# Patient Record
Sex: Female | Born: 1935 | Race: White | Hispanic: No | Smoking: Never smoker
Health system: Southern US, Community
[De-identification: ages and names within clinical notes are randomized; demographics above are authoritative.]

## PROBLEM LIST (undated history)

## (undated) DIAGNOSIS — I10 Essential (primary) hypertension: Secondary | ICD-10-CM

## (undated) DIAGNOSIS — I2510 Atherosclerotic heart disease of native coronary artery without angina pectoris: Secondary | ICD-10-CM

## (undated) DIAGNOSIS — F419 Anxiety disorder, unspecified: Secondary | ICD-10-CM

## (undated) DIAGNOSIS — H269 Unspecified cataract: Secondary | ICD-10-CM

## (undated) DIAGNOSIS — I739 Peripheral vascular disease, unspecified: Secondary | ICD-10-CM

## (undated) DIAGNOSIS — M1990 Unspecified osteoarthritis, unspecified site: Secondary | ICD-10-CM

## (undated) DIAGNOSIS — I509 Heart failure, unspecified: Secondary | ICD-10-CM

## (undated) DIAGNOSIS — R600 Localized edema: Secondary | ICD-10-CM

## (undated) DIAGNOSIS — L89159 Pressure ulcer of sacral region, unspecified stage: Secondary | ICD-10-CM

## (undated) DIAGNOSIS — E785 Hyperlipidemia, unspecified: Secondary | ICD-10-CM

## (undated) DIAGNOSIS — H3320 Serous retinal detachment, unspecified eye: Secondary | ICD-10-CM

## (undated) DIAGNOSIS — I639 Cerebral infarction, unspecified: Secondary | ICD-10-CM

## (undated) HISTORY — PX: TOTAL KNEE ARTHROPLASTY: SHX125

## (undated) HISTORY — PX: TONSILLECTOMY: SUR1361

## (undated) HISTORY — PX: OTHER SURGICAL HISTORY: SHX169

## (undated) HISTORY — PX: LUMBAR DISC SURGERY: SHX700

## (undated) HISTORY — PX: ANGIOPLASTY: SHX39

## (undated) HISTORY — DX: Hyperlipidemia, unspecified: E78.5

## (undated) HISTORY — PX: RETINAL DETACHMENT SURGERY: SHX105

## (undated) HISTORY — DX: Cerebral infarction, unspecified: I63.9

## (undated) HISTORY — DX: Anxiety disorder, unspecified: F41.9

## (undated) HISTORY — PX: CHOLECYSTECTOMY: SHX55

## (undated) HISTORY — DX: Peripheral vascular disease, unspecified: I73.9

## (undated) HISTORY — PX: JOINT REPLACEMENT: SHX530

---

## 1998-12-27 ENCOUNTER — Emergency Department (HOSPITAL_COMMUNITY): Admission: EM | Admit: 1998-12-27 | Discharge: 1998-12-27 | Payer: Self-pay | Admitting: Emergency Medicine

## 1999-06-19 ENCOUNTER — Encounter: Admission: RE | Admit: 1999-06-19 | Discharge: 1999-07-02 | Payer: Self-pay | Admitting: Orthopedic Surgery

## 2000-03-16 ENCOUNTER — Encounter: Payer: Self-pay | Admitting: Family Medicine

## 2000-03-16 ENCOUNTER — Encounter: Admission: RE | Admit: 2000-03-16 | Discharge: 2000-03-16 | Payer: Self-pay | Admitting: Family Medicine

## 2001-05-06 ENCOUNTER — Observation Stay (HOSPITAL_COMMUNITY): Admission: EM | Admit: 2001-05-06 | Discharge: 2001-05-07 | Payer: Self-pay | Admitting: *Deleted

## 2001-05-06 ENCOUNTER — Encounter: Payer: Self-pay | Admitting: Internal Medicine

## 2002-08-09 ENCOUNTER — Other Ambulatory Visit: Admission: RE | Admit: 2002-08-09 | Discharge: 2002-08-09 | Payer: Self-pay | Admitting: Family Medicine

## 2003-02-23 ENCOUNTER — Encounter: Payer: Self-pay | Admitting: Family Medicine

## 2003-02-23 ENCOUNTER — Encounter: Admission: RE | Admit: 2003-02-23 | Discharge: 2003-02-23 | Payer: Self-pay | Admitting: Family Medicine

## 2004-12-11 ENCOUNTER — Encounter: Admission: RE | Admit: 2004-12-11 | Discharge: 2004-12-11 | Payer: Self-pay | Admitting: Family Medicine

## 2005-02-18 ENCOUNTER — Inpatient Hospital Stay (HOSPITAL_COMMUNITY): Admission: EM | Admit: 2005-02-18 | Discharge: 2005-02-20 | Payer: Self-pay | Admitting: Emergency Medicine

## 2005-02-19 ENCOUNTER — Encounter (INDEPENDENT_AMBULATORY_CARE_PROVIDER_SITE_OTHER): Payer: Self-pay | Admitting: *Deleted

## 2005-08-20 ENCOUNTER — Encounter: Admission: RE | Admit: 2005-08-20 | Discharge: 2005-08-20 | Payer: Self-pay | Admitting: Family Medicine

## 2005-09-03 ENCOUNTER — Encounter: Admission: RE | Admit: 2005-09-03 | Discharge: 2005-09-03 | Payer: Self-pay | Admitting: Specialist

## 2005-09-18 ENCOUNTER — Encounter: Admission: RE | Admit: 2005-09-18 | Discharge: 2005-09-18 | Payer: Self-pay | Admitting: Specialist

## 2005-12-15 ENCOUNTER — Encounter: Admission: RE | Admit: 2005-12-15 | Discharge: 2005-12-15 | Payer: Self-pay | Admitting: Family Medicine

## 2006-06-09 ENCOUNTER — Emergency Department (HOSPITAL_COMMUNITY): Admission: EM | Admit: 2006-06-09 | Discharge: 2006-06-09 | Payer: Self-pay | Admitting: Emergency Medicine

## 2006-12-11 ENCOUNTER — Ambulatory Visit: Payer: Self-pay | Admitting: Cardiology

## 2006-12-17 ENCOUNTER — Ambulatory Visit: Payer: Self-pay

## 2007-02-17 ENCOUNTER — Inpatient Hospital Stay (HOSPITAL_COMMUNITY): Admission: RE | Admit: 2007-02-17 | Discharge: 2007-02-22 | Payer: Self-pay | Admitting: Specialist

## 2007-03-23 ENCOUNTER — Encounter: Admission: RE | Admit: 2007-03-23 | Discharge: 2007-06-21 | Payer: Self-pay | Admitting: Specialist

## 2007-11-25 ENCOUNTER — Encounter: Admission: RE | Admit: 2007-11-25 | Discharge: 2007-11-25 | Payer: Self-pay | Admitting: Specialist

## 2008-03-09 ENCOUNTER — Inpatient Hospital Stay (HOSPITAL_COMMUNITY): Admission: RE | Admit: 2008-03-09 | Discharge: 2008-03-16 | Payer: Self-pay | Admitting: Specialist

## 2008-03-14 ENCOUNTER — Encounter (INDEPENDENT_AMBULATORY_CARE_PROVIDER_SITE_OTHER): Payer: Self-pay | Admitting: Specialist

## 2008-03-14 ENCOUNTER — Ambulatory Visit: Payer: Self-pay | Admitting: Physical Medicine & Rehabilitation

## 2008-03-15 ENCOUNTER — Encounter (INDEPENDENT_AMBULATORY_CARE_PROVIDER_SITE_OTHER): Payer: Self-pay | Admitting: Specialist

## 2008-03-16 ENCOUNTER — Inpatient Hospital Stay (HOSPITAL_COMMUNITY)
Admission: RE | Admit: 2008-03-16 | Discharge: 2008-03-21 | Payer: Self-pay | Admitting: Physical Medicine & Rehabilitation

## 2008-03-16 ENCOUNTER — Ambulatory Visit: Payer: Self-pay | Admitting: Physical Medicine & Rehabilitation

## 2010-10-06 ENCOUNTER — Encounter: Payer: Self-pay | Admitting: Specialist

## 2011-01-28 NOTE — Discharge Summary (Signed)
NAMEDORINDA, STEHR NO.:  [id number]   MEDICAL RECORD NO.:  [id number]           PATIENT TYPE:   LOCATION:                                 FACILITY:   PHYSICIAN:  Ellwood Dense, M.D.   DATE OF BIRTH:  05-26-1931   DATE OF ADMISSION:  03/16/2008  DATE OF DISCHARGE:  03/21/2008                               DISCHARGE SUMMARY   DISCHARGE DIAGNOSES:  1. Lumbar stenosis with myelopathy.  2. Postoperative acute renal failure resolved.  3. Hyponatremia resolved.  4. Pain management.  5. Hypertension.  6. Hyperlipidemia.  7. Panic attacks.  8. Subcutaneous Lovenox for deep vein thrombosis prophylaxis.   Status post lumbar L3-L4, L4-L5 central lumbar decompression March 09, 2008, for lumbar stenosis per Dr. Jillyn Hidden.   This is a 77 year old white female history of bilateral total knee  replacements admitted March 09, 2008, with low back pain radiating to the  lower extremities.  MRI and myelogram showed degenerative disk disease,  spinal stenosis of lumbar L3-L4, L4-L5 with significant neural  compression.  Underwent central lumbar decompression, lumbar L3-L4, L4-  L5 June 25 per Dr. Jillyn Hidden.  Placed on subcutaneous Lovenox for deep vein  thrombosis prophylaxis.  Postoperative creatinine 1.01 elevated 2.85 as  well as hyponatremia 130.  Hydrochlorothiazide was discontinued and  placed on gentle intravenous fluids.  Renal ultrasound was negative for  hydronephrosis.  Acute renal failure felt to be likely  hydrochlorothiazide and ACE inhibitor which was discontinued.  Creatinine as of March 16, 2008, and improved to 2.35.  She did have a  small macular rash felt to be secondary to Rocephin which was  discontinued.   PAST MEDICAL HISTORY:  See discharge diagnoses.  Occasional alcohol.  No  tobacco.   ALLERGIES:  PENICILLIN.   SOCIAL HISTORY:  Lives with 65 year old husband and assistance as  needed.  Local family works level one level home with three steps to   entry.   MEDICATIONS PRIOR TO ADMISSION:  1. Celebrex daily.  2. Diltiazem 180 mg 2 tablets daily.  3. Simvastatin 80 mg daily.  4. Aspirin 81 mg daily.  5. Alprazolam 0.25 mg as needed.  6. Quinapril - hydrochlorothiazide 20 - 12.5 mg daily.  7. Hydrocodone as needed.   REHABILITATION HOSPITAL COURSE:  The patient was admitted to inpatient  rehab services with therapies initiated on a 3-hour daily basis  consisting of physical therapy, occupational therapy, and rehabilitation  nursing.  The following issues were addressed during the patient's  rehabilitation stay.  Pertaining to Ms. Rubano's lumbar stenosis with  myelopathy, she had undergone lumbar L3-L4 L4-L5 central disk  decompression March 09, 2008, per Dr. Jillyn Hidden.  Surgical site healing  nicely.  No signs of infection.  Routine back precautions were noted.  She was using Robaxin for muscle spasms as well as Vicodin for  breakthrough pain with good results.  Functionally, she was ambulating  short household distances with a walker of 30-50 feet.  She was  supervision minimal assist for transfers, minimal assist for lower body  dressing.  Full family teaching was completed with her  husband and plan  was to be discharged to home.  She remained on subcutaneous Lovenox for  deep vein thrombosis prophylaxis throughout her rehab course.  Postoperative acute renal failure improved with latest creatinine on  March 20, 2008, of 1.03.  It was noted that her hydrochlorothiazide and  ACE inhibitor had since been discontinued with monitoring of her blood  pressure, now she was on Cardizem 360, mg a day as well as Lopressor 25  mg twice daily.  She did have a small macular rash which did resolve.  She completed a course of prednisone.  This rash was felt to be  secondary to some Rocephin which had since been discontinued.  She would  remain on her Zocor for hyperlipidemia.  She was using alprazolam as  needed for history of panic attacks.    LABORATORY DATA:  Latest labs as of March 20, 2008, she had a sodium of  140, potassium 3.6, BUN 16, and creatinine 1.03.  Latest hemoglobin of  9.6, hematocrit 28.6, and platelet 273,000.   DISCHARGE MEDICATIONS:  At time of dictation included:  1. Vitamin C 500 mg daily.  2. Cardizem 180 mg 2 tablets daily.  3. Reglan 10 mg a.c. and nightly.  4. Protonix 40 mg daily.  5. Zocor 80 mg at bedtime.  6. Claritin 10 mg daily.  7. Lopressor 25 mg every 12 hours.  8. Robaxin 500 mg every 6 hours as needed spasms.  9. Vicodin 5 - 325 1 or 2 tablets every 4 hours as needed pain.   DIET:  Regular.   SPECIAL INSTRUCTIONS:  Routine back precautions.  No smoking, no  drinking, and no driving.  Follow up with Dr. Jillyn Hidden.  Orthopedic services  called for appointment Dr. Maurice Small medical management.      Mariam Dollar, P.A.    ______________________________  Ellwood Dense, M.D.    DA/MEDQ  D:  03/20/2008  T:  03/21/2008  Job:  [id number]   cc:   Gretta Arab. Valentina Lucks, M.D.  Billie D. Bean, FNP

## 2011-01-28 NOTE — Consult Note (Signed)
[name], [name]              ACCOUNT NO.:  [id number]   MEDICAL RECORD NO.:  [id number]          PATIENT TYPE:  INP   LOCATION:  1226                         [facility]ACILITY[facility]  [facility]   PHYSICIAN:  Hind I Elsaid, MD      DATE OF BIRTH:  08/04/1931   DATE OF CONSULTATION:  03/13/2008  DATE OF DISCHARGE:                                 CONSULTATION   REASON FOR CONSULTATION:  Worsening renal function.   HISTORY OF PRESENT ILLNESS:  This is a 77 year old Caucasian female with  a history of hypertension, coronary artery disease, admitted with severe  right and left lower extremity radicular pain, back pain.  Found to have  degenerative scoliosis and myelogram indicates significant neuro  compression, particularly in the right L3/4 and 4/5 in the concavity of  her scoliosis.  She has been refractory to conservative treatment and  was disabled by her symptoms.  Patient underwent lumbar decompression on  March 09, 2008.  Today patient is day #4 of her lumbar decompression.  Her condition complicated by acute renal failure where found to have  creatinine of 2.6.  Medical Team consulted for evaluation of her  worsening renal function.  Patient apparently on hydrochlorothiazide and  lisinopril for controlling her high blood pressure.  Looking over her  preoperative and postoperative period, there is no reported evidence of  hypertension episode.  Patient noted to have diminished urine output on  June 28 and 29.  Also patient noted to have decreased appetite and  decreased p.o. intake.  No recoded fever, no recorded nausea or vomiting  and no recorded shortness of breath or chest pain.   PAST MEDICAL HISTORY:  Significant for:  1. Coronary artery disease.  2. Hypertension.  3. Hyperlipidemia.  4. Status post cholecystectomy.  5. Status post total knee replacement.  6. Status post history of panic attack.  7. Bilateral tubal ligation.   MEDICATIONS AT THE HOSPITAL:  1. Ascorbic acid.  2.   Aspirin.  3.  D5 half normal saline.  4.      Cardizem.  5.  Hydrochlorothiazide.  6.  Lisinopril.  7.      Metoclopramide.  8.  Protonix.  9.  Zocor.  10.  Xanax.  11.      Hydrocodone/APAP.  12.  Phenergan.  13.  Ambien.   HOME MEDICATION:  1. Celebrex.  2. Simvastatin 80 mg.  3. Aspirin.  4. Quinapril.  5. Hydrocodone/Tylenol.   ALLERGIES:  PENICILLIN.   SOCIAL HISTORY:  Patient is retired.  She is married, has 6 children.  No history of smoking, no history of IV drug abuse.   REVIEW OF SYSTEMS:  Negative for chest pain.  Negative for rash.  Negative for nausea, vomiting or abdominal pain.  Has constipation,  first bowel movement today, one bowel movement which is loose.  No  hematuria.   EXAMINATION:  VITAL SIGNS:  Temperature 98.4.  Blood pressure 138/46.  Heart rate 97.  Respiratory rate 20 and saturated 98%on 2 liters.  HEENT:  Normocephalic, atraumatic.  Patient not in respiratory distress  or shortness of breath.  Not in pain.  Sitting on chair.  Alert,  oriented.  No JVD.  NECK:  Supple.  No lymphadenopathy.  HEART:  S1, S2.  LUNGS:  Diminished with normal physical breathing.  ABDOMEN:  Distended.  Bowel sounds positive.  Nontender.  EXTREMITY:  There is no lower extremity edema, but there is evidence of  some petechiae.   LABORATORY DATA:  Sodium 130 with potassium 3.9, chloride 100, bicarb  23, glucose 168, BUN 41, creatinine 2.61.  Urinalysis:  There is white  blood cell elevated to 20 and evidence of leucocytes esterase, esterase,  ketones and some blood.  __________  negative and CBC 19.6, hemoglobin  10.6, hematocrit 31.5 and platelet 200.   ASSESSMENT/PLAN:  1. Acute renal failure.  Patient admitted with creatinine of 0.71.      Right now creatinine is 2.6.  Possibility of  prerenal secondary to      dehydration versus acute tubular necrosis.  Patient will      discontinue hydrochlorothiazide and lisinopril.  Will avoid any      nephrotoxic medications.   We will change IV fluid to normal saline      at 100 cc per hour.  Monitor input and output.  We will get      ultrasound of the kidney and repeat BMET in a.m.  2. Hyponatremia.  Possible secondary to hypertonic solution and      hydrochlorothiazide.  We will switch to normal saline.  3. Leucocytosis.  Possibility of urinary tract infection.  We will      start the patient on Cipro 400 mg IV as the patient has allergy to      penicillin.  We will obtain urinalysis and culture, and chest x-      ray.  4. Anemia.  Possibility postoperative complication.  We will monitor.  5. Hypertension.  We will hold hydrochlorothiazide and lisinopril.  We      will resume Norvasc for systolics above  140 and labetalol as      p.r.n. dose.  Continue with Cardizem.  6. Status post lumbar decompression.  Physical therapy and      occupational therapy also.  Deep vein thrombosis prophylaxis also.   Thank you for consultation.  We will follow up with you.      Hind Bosie Helper, MD  Electronically Signed     HIE/MEDQ  D:  03/13/2008  T:  03/13/2008  Job:  [id number]

## 2011-01-28 NOTE — Discharge Summary (Signed)
NAMEKRYSTIANNA, [name] NO.:  [id number]   MEDICAL RECORD NO.:  [id number]          PATIENT TYPE:  IPS   LOCATION:  4039                         FACILITY:  [facility]   PHYSICIAN:  Ellwood Dense, M.D.   DATE OF BIRTH:  01-Jul-1931   DATE OF ADMISSION:  03/16/2008  DATE OF DISCHARGE:                               DISCHARGE SUMMARY   ATTENDING PHYSICIAN:  Herold Harms, MD.   PRIMARY CARE PHYSICIAN:  Gretta Arab. Valentina Lucks, MD.   ORTHOPEDIST:  Jene Every, MD   HISTORY OF PRESENT ILLNESS:  [name] is a 77 year old Caucasian  female with history of prior bilateral total knee replacements.   The patient was admitted on March 09, 2008, with low back pain with  radiating symptoms into her lower extremities.  MRI and myelogram showed  degenerative disk disease along with spinal stenosis at L3-4, L4-5 with  significant neural compression.  The patient underwent central lumbar decompression at L3-4 and L4-5 on  March 09, 2008, by Dr. Shelle Iron.  She was placed on subcu Lovenox for DVT  prophylaxis postoperatively.  She also developed postoperative renal  insufficiency with a creatinine from a baseline of 1.01 elevated to  2.85.  She also developed hyponatremia with a sodium of 130.  Hydrochlorothiazide was discontinued secondary to the decreased renal  function and she was placed on minimal IV fluids.  A renal ultrasound  was negative for hydronephrosis.  It was felt that her acute renal  failure likely was secondary to combination of hydrochlorothiazide and  ACE inhibitor and those were discontinued.  Today March 16, 2008, her  creatinine improved to 2.35, but she still remains on IV fluids at a  rate of 70 cc/hour of half-normal saline.  She has developed a small  macular rash, questionably related to Rocephin and that was  discontinued.  She was done on 5 days of prednisone for that rash.  She  was also placed on fluconazole on March 15, 2008, for questionable  superficial  fungal infection for 3 days duration.   The patient was evaluated by the rehabilitation physicians and felt to  be an appropriate candidate for inpatient rehabilitation.   REVIEW OF SYSTEMS:  Positive for lumbago and anxiety and wound healing.   PAST MEDICAL HISTORY:  1. Coronary artery disease.  2. Hypertension.  3. Dyslipidemia.  4. Panic attacks.  5. Morbid obesity.  6. Hearing loss.  7. Bilateral total knee replacements.  8. Tubal ligation.   FAMILY HISTORY:  Positive for coronary artery disease and stroke.   SOCIAL HISTORY:  The patient lives with her 68 year old husband who  takes care of himself, but really cannot provide much assist to her.  He  still does drive.  There are local family at the work, they can stop in  and check on the patient.  The patient and her husband live in a one  level home with three steps to enter.  She reports that occasional use  of beer but denies tobacco usage.   FUNCTIONAL HISTORY PRIOR ADMISSION:  Independent using a cane or walker.   ALLERGIES:  PENICILLIN.   MEDICATIONS PRIOR TO ADMISSION:  1. Celebrex 200 mg daily.  2. Diltiazem 180 mg 2 tablets daily.  3. Simvastatin 80 mg daily.  4. Aspirin 81 mg daily.  5. Alprazolam 0.25 mg p.r.n.  6. Quinapril/hydrochlorothiazide 20/12.5 one tablet daily.  7. Hydrocodone p.r.n.   LABORATORY:  Recent hemoglobin was 9.7 with hematocrit of 29.2, platelet  count of 276,000, and white count of 14.8.  Urinalysis on March 16, 2007,  showed 3-6 white blood cells with few bacteria and negative nitrates  with the cultures still pending.  Recent sodium was 138, potassium 3.9,  chloride 112, bicarbonate 21, BUN 39, and creatinine 2.35.   PHYSICAL EXAM:  GENERAL: Morbidly obese adult female lying in bed in  mild-to-no acute discomfort.  VITAL SIGNS: Blood pressure 150/63 with a pulse of 83, respiratory rate  18, and temperature 97.0.  HEENT: Normocephalic and nontraumatic.  CARDIOVASCULAR: Regular rate  and rhythm, S1-S2 without murmurs.  Abdomen: Soft, obese, and nontender with positive bowel sounds.  LUNGS: Clear to auscultation bilaterally.  NEUROLOGIC: Alert and oriented x3.  Cranial nerves II-XII intact.  Bilateral upper extremity exam showed 5/5 strength throughout.  Bulk and  tone were normal.  Reflexes were 2+ and symmetrical.  Sensation was  intact to light touch with the bilateral upper extremities.  Lower  extremity exam showed 4-/5 strength throughout.  Bulk and tone were  normal.  Reflexes were 2+ and symmetrical.  Examination of her back  showed a well-healing midline wound measuring approximately 4 inches in  length without significant drainage with staples in place.   DIAGNOSES:  1. Status post L3-4 and L4-5 central lumbar laminectomy/decompression      for lumbar stenosis/myelopathy, postoperative day #7 by Dr. Shelle Iron.  2. Postoperative pain control.  3. Postoperative acute renal failure, improved.   Presently, the patient has deficits in ADLs, transfers, and ambulation  related to the above noted lumbar surgery.   PLAN:  Admit to the rehabilitation unit for daily therapies to include:  1. Physical therapy for range of motion, strengthening, bed mobility,      transfers, pre-gait training, gait training, and equipment      evaluation.  2. Occupational therapy for range of motion, strengthening, ADLs,      cognitive/perceptual training, splinting, and equipment evaluation.  3. Rehab nursing for skin care, wound care, and bowel and bladder      training as necessary.  4. Case management to assess home environment, assist with discharge      planning, and arrange for appropriate followup care.  5. Social work to assess family and social support, assist in      discharge planning.  6. Check admission labs including CBC and CMET in a.m. on March 17, 2008.  7. Incentive spirometry q.2 h. while awake.  8. Dry dressing change to the lumbar spine daily and p.r.n.  9.  Vicodin 5/500 one to two tablets p.o. q.4 h. p.r.n.  10.Vitamin C 500 mg p.o. daily.  11.Cardizem 360 mg p.o. daily.  12.Senokot-S 2 tablets p.o. at bedtime.  13.Subcu Lovenox 30 mg daily.  14.Reglan 10 mg p.o. a.c. and at bedtime.  15.Protonix 40 mg p.o. daily.  16.Zocor 80 mg p.o. daily.  17.Xanax 0.25 mg p.o. q.6 h. p.r.n.  18.Routine back precautions without brace.  19.Robaxin 500 mg p.o. q.6 h. p.r.n. spasm.  20.Routine turning to prevent skin breakdown.  21.Fluconazole 50 mg p.o. daily.  22.Restrict in's and out's.  23.Hypoallergenic sheets.  24.One half normal saline at 70 cc/hour with checkup at AM labs.  25.Claritin 10 mg p.o. daily.  26.Prednisone 40 mg p.o. daily x5 days for presumed skin rash.  27.Lopressor 12.5 mg p.o. b.i.d.   PROGNOSIS:  Good.   ESTIMATED LENGTH OF STAY:  Fine to fifteen days.   GOALS:  Modified bed, pain, ADLs, transfers, ambulation, and household  distances.           ______________________________  Ellwood Dense, M.D.     DC/MEDQ  D:  03/16/2008  T:  03/17/2008  Job:  [id number]

## 2011-01-28 NOTE — Discharge Summary (Signed)
NAMEBUENA, BOEHM              ACCOUNT NO.:  [id number]   MEDICAL RECORD NO.:  [id number]          PATIENT TYPE:  INP   LOCATION:  1614                         FACILITY:  [facility]   PHYSICIAN:  Jene Every, M.D.    DATE OF BIRTH:  09-30-1930   DATE OF ADMISSION:  02/17/2007  DATE OF DISCHARGE:  02/22/2007                               DISCHARGE SUMMARY   ADMISSION DIAGNOSIS:  1. Degenerative joint disease, left knee  2. Hypertension  3. Hyperlipidemia  4. Anxiety.   DISCHARGE DIAGNOSIS:  1. Status post left total knee arthroplasty  2. Stable postoperative anemia  3. Hypertension  4. Hyperlipidemia  5. Anxiety.   HISTORY:  Ms. Drzewiecki is a pleasant 77 year old female with longstanding  history of  left knee pain.  She had undergone conservative treatment  including cortisone injections with activity modification, but  unfortunately has noted progression of her symptoms.  X-rays do show end-  stage osteoarthritis of the left knee with a valgus deformity.  On exam  she does have loss of range of motion but no instability.  It is felt at  this point due to disabling nature of her symptoms that she would  benefit from a total knee arthroplasty.   PROCEDURE:  The patient was taken to the OR on February 17, 2007 to  undergo  a left total knee.  Surgeon Dr. Jene Every, assistant Roma Schanz,  anesthesia general.  Complications none.   CONSULTS:  PT, OT, case management.   LABORATORY DATA:  Preoperative BMET showed white cell count 8.2,  hemoglobin of 10.0, hematocrit 35.4.  These were followed throughout the  hospital course.  White cell count remained normal.  Hemoglobin did drop  to a level of 8.9, hematocrit 26.5, however this did resolve with the  hemoglobin 9.3, hematocrit 27.4 at time of discharge.  Coagulation  studies done preoperatively showed PT of 12.5, INR 0.9, PTT of 26.  The  patient was placed on Coumadin postoperatively for DVT prophylaxis.  Coagulation  studies were monitored.  At time of discharge she was close  to therapeutic with an INR of 1.9.  Routine chemistries done  preoperatively within normal range.  These were followed throughout the  hospital course. All remained normal other than her glucose which did  seem to fluctuate, highest being 137.  At time of discharge glucose is  108.  Preoperative urinalysis showed trace leukocyte esterase with 0-2  WBCs seen per high-powered field.  Blood type is AB positive.  Do not  see a preoperative chest x-ray or EKG in the chart.  The patient did our  have preoperative clearance with stress test.   HOSPITAL COURSE:  The patient was admitted, taken to the OR and  underwent the above stated procedure without significant difficulty.  One Hemovac drain was placed intraoperatively.  She was then transferred  to the PACU, then to the orthopedic floor for continued postoperative  care.  Postoperatively she was placed on PCA for pain relief.  Postop  day #1 the patient was doing fairly well.  Pain was controlled.  Hemoglobin was stable.  Hemovac was continued.  PCA was weaned.  Discharge planning was initiated.  She was placed on Coumadin for DVT  prophylaxis.  Postoperative day #2 the patient continued to do well.  Pain was controlled on p.o. analgesics.  She denied any chest pain,  shortness of breath or dizziness.  Hemoglobin was stable at 9.2,  hematocrit 26.8.  Hemovac was discontinued,  __________  intact, INR of  1.4 on the Coumadin.  Dressing was changed.  Incision was clean, dry  without evidence of infection.  The patient was progressing well with  her therapy towards her goals.  She stayed until postoperative day  number five where she had met her goals and was independent with her  ADLs.  Pain was controlled on p.o. analgesics.  She was voiding and  passing of flatus and actually had had a BM without difficulty.  Hemoglobin was stable at 9.3, 27.4.  She is therapeutic on her Coumadin   with level of 1.9.  All electrolytes were stable.  At this point it was  felt the patient was stable to be discharged to home.   DISPOSITION:  The patient discharged to home with home health needs met.   DIET:  Is as tolerated   MEDICATIONS:  She she will resume all home meds.  Percocet was given for  pain relief.  She will continue with Coumadin as dosed per pharmacy,  vitamin C 500 mg daily as well as a multivitamin with iron.   ACTIVITY:  The patient is to elevate her legs six times a day for 20  minutes at a time.  She is to ambulate as tolerated utilizing knee  immobilizer until she can straight leg raise.  She is keep her incision  clean and dry, change her dressing daily until no drainage.  We  discussed signs and symptoms of infection.   FOLLOW UP:  She will see Dr. Shelle Iron in approximately 10-14 days for  suture removal and x-ray.   CONDITION ON DISCHARGE:  Stable.   FINAL DIAGNOSIS:  Doing well status post left total knee arthroplasty.      Roma Schanz, P.A.      Jene Every, M.D.  Electronically Signed    CS/MEDQ  D:  04/16/2007  T:  04/16/2007  Job:  [id number]

## 2011-01-28 NOTE — Op Note (Signed)
[name], [name]              ACCOUNT NO.:  [id number]   MEDICAL RECORD NO.:  [id number]          PATIENT TYPE:  INP   LOCATION:  1226                         FACILITY:  [facility]   PHYSICIAN:  Jene Every, M.D.    DATE OF BIRTH:  May 19, 1931   DATE OF PROCEDURE:  03/09/2008  DATE OF DISCHARGE:                               OPERATIVE REPORT   PREOPERATIVE DIAGNOSES:  Spinal stenosis L3-4 and L4-5.   POSTOPERATIVE DIAGNOSES:  Spinal stenosis L3-4 and L4-5.   PROCEDURE PERFORMED:  1. Central lumbar decompression L3-4, L4-5 by laminectomies of 4 and      partial of 5.  2. Application of DuraGen to dural rent on the right.   ANESTHESIA:  General.   ASSISTANT:  Dr. Simonne Come   BRIEF HISTORY AND INDICATIONS:  This patient is 76-year female with  severe right and left lower extremity radicular pain, back pain had  degenerative scoliosis and myelogram indicating significant neural  compression, particularly on the right at 3-4 and 4-5 in the concavity  of her scoliosis.  She had been refractory to conservative treatment and  was significantly disabled by her symptoms and, therefore, was indicated  for a lumbar decompression.  The risks and benefits discussed, including  bleeding, infection, damage to neurovascular structures, CSF leakage,  epidural fibrosis, adjacent segment disease with need for fusion in the  future, inability to decompress, DVT, PE, anesthetic complications etc.   TECHNIQUE:  Patient in supine position.  After induction of adequate  general anesthesia and a gm of Kefzol, the patient was placed in the  prone position on the Lindy frame.  All bony prominences were well  padded.  The lumbar region was prepped and draped in the usual sterile  fashion.  A surgical incision was made from the spinous process of 3  down to the sacrum.  Subcutaneous tissue was dissected.  Electrocautery  was utilized to achieve hemostasis.  The dorsolumbar fascia identified  and found  on skin incision.  Kochers were placed confirming the  operative level at 3-4 and 4-5.  The paraspinous muscle were elevated  from the lamina of 3-4 and 5.  McCullough retractor was placed.  Operating microscope draped on the surgical field.  The spinous  processes of 4, partially of 3-5 were removed with the Leksell rongeur.  A 2 mm Kerrison was then utilized to perform a central decompression,  first of the lamina of 4, removing the ligamentum flavum from the  interspace and the ligamentum of 3.  We then decompressed first down to  the left to the medial border of the pedicle of 4-5 on the left,  performing foraminotomies of 4-5, decompressing the thecal sac with  neural patties protecting neural elements at all times.  There were some  adhesions noted here on the left, particularly at 4-5.  There were hard  disks noted, but no frank disk herniations.  The thecal sac was noted be  very attenuated.  We then turned our attention to the right, and again  with the use of the operating microscope, we first performed a  foraminotomy of  5.  The particulate region at the concavity of 3-4 and  at 4-5, we first started cephalad and decompressed at 3-4 to the medial  border of the pedicle, protecting the neural elements at all times.  The  4-5 level with gentle dissection utilizing a Penfield IV, a hockey-stick  probe and neural patties and gently mobilized the thecal sac, which was  adhered to the facet at 4-5, particularly the articulating process of 4.  We then folded ligamentum flavum.  Severe compression of thecal sac at  that level.  This appeared to be the area of the significant pathology.  While decompressing the lateral recess to the medial border of the  pedicle, a rent was appreciated in the thecal sac.  It measured  approximately 1.5 x 0.5 cm.  There was CSF fluid appreciated as well.  A  further attempt to mobilize the thecal sac cephalad and caudad with  further adhesions noted, we  felt that due to the attenuation of the  thecal sac any further mobilization would only exacerbate the dural  rent.  Evaluating the dural rent, we felt no conceivable manner in which  to perform a dural repair due to the attenuation of the thecal sac.  We  then elected to proceed with a DuraGen patch after copious irrigation.  I placed a DuraGen patch over the thecal sac and covering it laterally  underneath the facet.  A second patch was placed, reflecting on the  medial aspect and over the posterior aspect of the facet.  We then used  Tisseel after it was appropriately mixed, and then injected over the top  of the DuraGen in the area of the central laminectomy defect.  After  appropriate curing of the Tisseel, we then performed a Valsalva, there  was no CSF leakage.  The wound copiously irrigated.  Removed the  [facility] retractor.  Irrigation was performed, no active bleeding.  I  repaired the dorsolumbar fascia with #1 Vicryl interrupted figure-of-  eight sutures, followed by a running #1 Vicryl suture for a watertight  closure.  The subcutaneous tissue reapproximated with 2-0 Vicryl simple  suture and the skin was reapproximated with staples.  The wound was  dressed sterilely.  She was placed on the hospital bed, extubated  carefully without difficulty and transported to the recovery room in  satisfactory condition.   The patient tolerated the procedure well.   In the recovery room, the patient was without headache and was  neurovascularly intact.      Jene Every, M.D.  Electronically Signed     JB/MEDQ  D:  03/11/2008  T:  03/11/2008  Job:  [id number]

## 2011-01-28 NOTE — Op Note (Signed)
NAMEMARGE, VANDERMEULEN              ACCOUNT NO.:  [id number]   MEDICAL RECORD NO.:  [id number]          PATIENT TYPE:  INP   LOCATION:  0006                         FACILITY:  [facility]   PHYSICIAN:  Jene Every, M.D.    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   PROCEDURE PERFORMED:  Left total knee arthroplasty.   ANESTHESIA:  General.   SURGEON:  Jene Every, M.D.   ASSISTANT:  Doroteo Bradford   BRIEF HISTORY AND INDICATIONS:  This is a 77 year old patient of mine  who is status post a total knee arthroplasty in the remote past.  She  had end stage osteoarthrosis of the knee, also spinal stenosis, and  neurogenic claudication.  She had a mild valgus deformity and a flexion  contracture.  Operative intervention was indicated for replacement of  the degenerative joint.  The risks and benefits were discussed including  bleeding, infection, damage to neurovascular structures, suboptimal  range of motion, arthrofibrosis, component failure, need for revision,  anesthetic complications, etc.   SURGICAL TECHNIQUE:  With the patient in the supine position, after  induction of adequate general anesthesia and 1 gram Kefzol, the left  lower extremity was prepped and draped and exsanguinated in the usual  sterile fashion.  The thigh tourniquet was inflated to 350 mmHg.  A  midline incision anteriorly was made on the patella.  The subcutaneous  tissue was dissected.  Electrocautery was utilized to achieve  hemostasis.  A medial parapatellar arthrotomy was performed.  The  patella was everted.  Marked osteophytes were removed from the femur and  the tibia.  A slight avulsion of the medial aspect of the patellar  tendon noted.  A step drill  was utilized to enter the femoral canal  after the remnants of the ACL, medial and lateral menisci were removed.  Minimal soft tissue release was performed medially.  I entered the  canal.  I used a 5 degree left affixed in the end of the femur and first  utilized the 12 given her hyperplastic lateral femoral condyle as well  as her valgus deformity.  We were able to remove just a bit of the  lateral femoral condyle.  Chamfer cuts were performed, as well.   I then turned attention toward the tibia.  The tibia was subluxed, the  knee was flexed.  Just prior to that, we placed a distal femoral cutting  jig on the femur and a sizing guide and it was sized to a 3.  We then  put distal femoral chamfer guide in and anterior, posterior, and chamfer  cuts were performed.  The knee was subluxed and measured to a 4,  external alignment guide parallel to the tibia just medial to the tibial  tubercle, bisecting the web space and the ankle.  We took 2 off the  posterior medial portion of  the lateral femur, given her defect.  This  was pinned.  An oscillating saw was utilized to perform the tibial cut.  We removed osteophytes from the lateral aspect of the tibia and the  femur.  We then removed the remnant of the PCL.  We tried to space her  in flexion and extension and we felt she was tight in extension.  Therefore, decided to take 2 more off the distal femur.  We re-entered  the intramedullary guide and the distal femoral cutting jig guide and  took, again, 2 off the end of the femur and performed the chamfer cuts,  as well.  Spacer block was then re-utilized and it was found to be an  equal flexion extension gap.   The patella was measured and osteophytes were removed.  It was found to  be a 35 thickness, 25, we used a patellar planer down to a 16.  We  drilled the three peg holes after it sized to a 35 lateralizing the  patella.  Next, we turned our attention back to the femur.  We made our  box  cup after trying the overlapping box cut guide.  Protected it  posteriorly with a saw blade, made our medial and lateral cuts, removed  the box and a remnant of the ACL preserving posterior structures at all  times, and all femoral cuts were made with curved Crego protecting the  posterior elements.  Next, I subluxed the tibia, put our baseplate upon  that, pinned it with maximal coverage, and used our punch guide, our  direct intramedullary drill.  After that, we trialed a 3 femur, 4 tibia,  10 insert, full extension, good flexion, good stability to varus and  valgus stressing, 0 to 30 degrees, equal flexion and extension gap, good  patellofemoral tracking.   All trials were removed, pulsatile lavage, copiously irrigated, and  cleaned the cancellous surfaces.  The knee was flexed nd tried.  The  cement was mixed appropriately on the back table and injected into the  proximal tibia and onto the tibial tray.  This was then impacted in the  appropriate version.  We then cemented the femur onto the distal femur,  cemented on the chamfer, redundant cement removed.  I placed a 10  insert.  Reduced the knee and held it in extension with axial load  applied and redundant cement removed.  We cemented the patella, clamped  it.  After the appropriate curing of the cement, we removed all the  redundant cement, removed the insert, tried a 10 and a 12.5, the 10 was  satisfactory in extension and flexion, no instability to varus or valgus  stress at 0 and 30 degrees.  It had good flexion to 30 degrees and 130  degrees full extension.  We selected the 10.  We removed residual loose  bodies posteriorly.  We inserted the permanent 10, impacted it into  place, again, good extension, full flexion 130 degrees, valgus and varus  stressing from 0 to 30 degrees, normal patellofemoral tracking.  No  lateral release was required.  I placed a suture anchor at the tibial tubercle to pull down and reinforce the  insertion of the quadriceps  tendon.   The wound was copiously irrigated with antibiotic irrigation.  A Hemovac  placed and brought up through a lateral stab wound in the skin.  The  patellar arthrotomy was repaired with #1 Vicryl interrupted figure-of-  eight sutures.  The subcutaneous tissues were reapproximated with  2-0  Vicryl simple sutures and the skin was reapproximated with staples.  The  wound was dressed sterilely.  After closure, we held the knee in flexion  and obtained 90 degrees flexion against gravity.  A sterile dressing was  applied and secured with Ace bandages.  The tourniquet was deflated at 2  hours and there was adequate revascularization of the lower extremity  appreciated.  The patient tolerated the procedure well without  complications.      Jene Every, M.D.  Electronically Signed     JB/MEDQ  D:  02/17/2007  T:  02/17/2007  Job:  [id number]

## 2011-01-28 NOTE — H&P (Signed)
[name], [name]              ACCOUNT NO.:  [id number]   MEDICAL RECORD NO.:  [id number]           PATIENT TYPE:   LOCATION:                                 FACILITY:   PHYSICIAN:  Jene Every, M.D.    DATE OF BIRTH:  1931/05/04   DATE OF ADMISSION:  02/17/2007  DATE OF DISCHARGE:                              HISTORY & PHYSICAL   CHIEF COMPLAINT:  Left knee pain.   HISTORY OF PRESENT ILLNESS:  [name] is a long-standing patient of  Dr. Ermelinda Das.  She has had many years of knee pain which has, in the past  been treated conservatively, but over the past several months she has  noted increasing severe pain.  It is difficult for her to do any type of  activity.  X-rays do show end-stage osteoarthritis of the left knee with  valgus deformity.  She does have loss of range-of-motion, but there is  no instability with varus and valgus stress; and she states that the  pain is very disabling for her.  She described it as severe. It was felt  at this point that she would benefit from a total knee arthroplasty.  The risks and benefits of this were discussed with the patient.  She  does wish to proceed.  The patient did undergo a preoperative clearance  through Dr. Maurice Small; and then through cardiology she had a normal  stress test; and was subsequently cleared for surgical intervention.   PAST MEDICAL HISTORY:  1. Hypertension.  2. Hyperlipidemia.  3. Anxiety.   PRIMARY CARE PHYSICIAN:  Gretta Arab. Valentina Lucks, M.D.   CURRENT MEDICATIONS INCLUDE:  1. Oxycodone and alternating with Vicodin p.r.n. pain.  2. Celebrex 200 mg daily.  3. Simvastatin 80 mg one p.o. daily.  4. Cardiac XT 360 mg daily.  5. Quinaretic 20/12.5 mg one p.o. daily.  6. Alprazolam 0.25 mg one p.o. p.r.n. anxiety.   ALLERGIES INCLUDE:  PENICILLIN which caused a rash.   PREVIOUS SURGERY:  1. Cholecystectomy.  2. Right total knee arthroplasty in 1995.   SOCIAL HISTORY:  The patient is retired, she is  married, she has six  children five are still living.  She has never smoked cigarettes.  She  drinks two to three 12-ounce beers a day.   FAMILY HISTORY:  She does have a couple of sisters that have a history  of coronary artery disease, a CVA, also a couple of sisters that had  history of breast cancer.   REVIEW OF SYSTEMS:  Positive for difficulty hearing.  Low cramps and  pain in her back and spine which limits her.  She has varicose veins.  Negative for all other systems.   PHYSICAL EXAMINATION:  VITAL SIGNS:  Pulse is 76; respirations 20; blood  pressure is 160/70.  Weight is 214, body mass index of 37.  GENERAL:  This is an overweight female, sitting upright in no acute  distress.  However she does have antalgic gait with ambulating.  HEENT:  Atraumatic, normocephalic.  Pupils equal round and reactive to  light.  EOMs intact.  NECK:  Supple, no lymphadenopathy.  CHEST:  Clear to auscultation bilaterally.  No rhonchi, wheezes, or  rales.  BREASTS/GENITOURINARY:  Not examined, not pertinent to HPI.  HEART:  Regular rate and rhythm without murmurs, gallops, or rubs.  ABDOMEN:  Soft, nontender.  It is protuberant.  She has positive bowel  sounds.  SKIN:  No rashes or lesions are noted.  EXTREMITIES:  She is tender to palpation along the medial joint line of  the left knee.  She has mild-to-severe crepitus ranging from zero to 90  degrees.  There is mild effusion noted.  Deep pedal pulses are equal.  Calves are soft and nontender.   IMPRESSION:  End-stage osteoarthritis left knee.   PLAN:  The patient will be admitted to [facility] to undergo a  left total knee arthroplasty.      Roma Schanz, P.A.      Jene Every, M.D.  Electronically Signed    CS/MEDQ  D:  02/05/2007  T:  02/05/2007  Job:  [id number]

## 2011-01-31 NOTE — Discharge Summary (Signed)
[name], [name]              ACCOUNT NO.:  [id number]   MEDICAL RECORD NO.:  [id number]          PATIENT TYPE:  INP   LOCATION:  5731                         FACILITY:  [facility]   PHYSICIAN:  Leonie Man, M.D.   DATE OF BIRTH:  May 09, 1931   DATE OF ADMISSION:  02/18/2005  DATE OF DISCHARGE:  02/20/2005                                 DISCHARGE SUMMARY   DISCHARGE DIAGNOSES:  1.  Acute cholecystitis status post laparoscopic cholecystectomy by Dr.      Lurene Shadow on February 19, 2005.  2.  History of hypertension.  3.  History of hypercholesterolemia.  4.  History of panic attacks.  5.  History of angina.  6.  Status post bilateral tubal ligation.  7.  Status post total knee replacement.   HOSPITAL COURSE:  [name] is a 77 year old female with a several month  history of right upper quadrant pain that has been intermittent.  Her last  episode was two weeks prior to admission.  The date of admission she awoke  with severe right upper quadrant pain associated with nausea and vomiting.  Ultrasound revealed cholelithiasis with gallbladder wall thickening.  She  was admitted then for cholecystectomy.   The patient does tell me that she has a history of angina and had a  catheterization years ago by Dr. Juanda Chance.  She was told that she had some  blockage.  For this reason we did obtain a preoperative cardiology  consultation and she was cleared from a cardiac standpoint for surgery.  On  February 19, 2005 she underwent a laparoscopic cholecystectomy with  intraoperative cholangiogram by Dr. Leonie Man.  She tolerated the  procedure well and by the next day she was eating.  Her vital signs were  stable and she was afebrile.  She was ready for discharge to home.  She is  being discharged to home in stable condition.   DISCHARGE MEDICATIONS:  Are unchanged from her admission medications, which  include:  1.  Lipitor.  2.  Cardia.  3.  Celebrex.  4.  Quinoretic.  5.  Hydrocodone for  which she apparently has been taking at home for      arthritis.  6.  She may utilize Vicodin if needed.   FOLLOW UP:  She is to return to the office to see Dr. Lurene Shadow on March 07, 2005 at 1:30 P.M.  She has been given a home care instruction laparoscopic  cholecystectomy sheet for further instructions regarding activity, meals,  wound care and information regarding when to call her physician.       LB/MEDQ  D:  02/20/2005  T:  02/20/2005  Job:  [id number]   cc:   Leonie Man, M.D.  [address]  [address]  [place]  [place] [address]   Gretta Arab. Valentina Lucks, M.D.  [address] [place]  [place] [address]  Fax: [phone]

## 2011-01-31 NOTE — H&P (Signed)
[place]  Patient:    [name], [name] Visit Number: [id number] MRN: [id number]          Service Type: MED Location: 1800 [id number] [id number] Attending Physician:  Corlis Leak Adm. Date:  05/06/2001                           History and Physical  ADMITTING DIAGNOSES: 1. Chest pain, rule out myocardial infarction. 2. History of panic attacks. 3. History of hypertension. 4. History of hypercholesterolemia.  HISTORY OF PRESENT ILLNESS:  The patient is a 77 year old white female with a history of hypertension, hypercholesterolemia, and panic attacks, with prior cardiac catheterization six years ago showing "slight blockage," who presented to the ER after a three-hour history of "smothering episode."  She states she gets these occasionally.  This one, however, lasted longer than usual.  She used to have "something mild" for panic but nothing on hand now.  She describes th episodes as a sensation of dyspnea.  This episode was also associated with diaphoresis, and prior episodes have not been associated with diaphoresis.  Her symptoms resolved in the ER.  The patient states that it did not seem to resolve with any particular treatment, yet she was given a nitroglycerin patch at some point during her ER stay.  I am not sure temporally if the patch was applied prior to cessation of her symptoms.  She will be admitted for rule out MI.  PAST MEDICAL HISTORY: 1. Hypertension. 2. Hypercholesterolemia. 3. DJD, status post right TKR. 4. Panic attacks. 5. Status post BTL. 6. Mild CAD diagnosed by cardiac catheterization by Dr. Charlies Constable about    six years ago per patient history.  No old records available.  REVIEW OF SYSTEMS:  Positive for dyspnea and diaphoresis but negative for chest pain, neurologic symptoms, GI or GU symptoms.  MEDICATIONS: 1. Diltiazem ER 240 mg q.d. 2. Lipitor 20 mg q.d. 3. Celebrex 200 mg q.d. 4. Accuretic of an unknown  dose, but she has been out of it for a week due to    financial concerns.  ALLERGIES:  No known medication allergies.  FAMILY HISTORY:  Her mother died of unknown causes when the patient was young.  Her father died of a gunshot wound.  Her sister had breast cancer but is a 20-year survivor.  Another sister had a CVA.  SOCIAL HISTORY:  Patient is married and has six children.  She is a Futures trader. She does not smoke.  PHYSICAL EXAMINATION:  VITAL SIGNS:  Her temperature is 97.0, blood pressure 180/80, pulse 90, respiratory rate 18, room air O2 saturation is 98%.  GENERAL:  Physical exam reveals the patient to be alert and oriented, in no acute distress, and in no respiratory distress.  HEENT:  Moist mucous membranes.  NECK:  No JVD or bruit.  CHEST:  Nontender.  Lungs are clear.  CARDIAC:  Regular rate and rhythm without murmur.  ABDOMEN:  Soft and nontender with no masses, rebound, or guarding.  GENITOURINARY:  Deferred at this time and not applicable to her admission complaint.  LABORATORY DATA:  Hemoglobin 12.3, with a white count of 6.8, a platelet count of 277,000 and an MCV of 92.  Electrolytes show sodium of 138 with potassium of 3.7, bicarbonate of 23, chloride 108, BUN of 16, and a creatinine of 0.7, with a glucose of 122.  Her liver enzymes are normal.  Her CK is 162, and  her MB is 1.8 with a negative troponin at 0.01.  EKG shows normal sinus rhythm with first degree AV block and an inverted T-wave in V1 but no ischemic change.  IMPRESSION:  The patient is a 77 year old white female with an episode of dyspnea and diaphoresis with a history of mild coronary artery disease six years ago.  This is possibly a panic attack but with her history that this episode has lasted longer than others and was associated with new symptoms, would admit for rule out myocardial infarction. Attending Physician:  Corlis Leak DD:  05/06/01 TD:  05/06/01 Job:  [id number]

## 2011-01-31 NOTE — Discharge Summary (Signed)
[name]              ACCOUNT NO.:  [id number]   MEDICAL RECORD NO.:  [id number]         PATIENT TYPE:  LINP   LOCATION:                               FACILITY:  [facility]   PHYSICIAN:  Jene Every, M.D.    DATE OF BIRTH:  [birth year]   DATE OF ADMISSION:  03/09/2008  DATE OF DISCHARGE:  03/16/2008                               DISCHARGE SUMMARY   ADMISSION DIAGNOSES:  1. Severe spinal stenosis 3-4 and 4-5.  2. Coronary artery disease.  3. Hypertension.  4. Hyperlipidemia.  5. Osteoarthritis.  6. History of panic attacks.   DISCHARGE DIAGNOSES:  1. Status post central decompression of L3-L4, L4-L5 with repair of      dural tear.  2. Stable renal function.  3. Resolved hyponatremia.  4. Resolved leukocytosis.  5. Asymptomatic postoperative anemia.  6. Otherwise, same as above.   HISTORY:  [name] is a 77 year old female who presented to our  office with a neurogenic claudication. Studies revealed severe stenosis  3-4 and 4-5, the patient failed conservative treatment. Felt [name] would  benefit from a lumbar decompression.  [name] elected to proceed.   PROCEDURE NOTE:  The patient was taken to the OR on March 09, 2008,  underwent central decompression of L3-4 and 4-5 with repair of dural  rent on the right.   SURGEON:  Dr. Jene Every.   ASSISTANT:  Marlowe Kays, MD.   ANESTHESIA:  General.   CONSULT:  PT/OT, Care Management, rehab, [facility] hospitalists.   LABORATORY:  Preoperative CBC showed a white cell count of 7.3,  hemoglobin 12.5, hematocrit 36.9.  These were followed throughout the  hospital course.  White cell count did fluctuate highest being 19.6, at  time of discharge to rehab it was 14.8, hemoglobin did drop to a low of  9.3 max 27.9, at time of discharge rehab at 9.7, 29.2.  Coagulation  studies done preop within normal range.  Routine chemistries showed  sodium was 141, potassium 3.9, with a normal BUN and creatinine, with  BUN being 20,  creatinine being 0.84.  This was followed throughout the  hospital course.  Sodium did drop to a level of 130, at discharge was  improved to 138, potassium remained stable with a level 3.9.  Discharge  renal function unfortunately did elevate during the hospital stay.  Determined to be secondary to dehydration as use of ACE inhibitor and  hydrochlorothiazide.  At its worst BUN 45 with creatinine 2.8, the  patient was gently hydrated at time of discharge.  BUN and creatinine  was improved with BUN 39, creatinine 2.35.  Routine liver function tests  were within normal range except for the ALP which is elevated at 119.  Cardiac markers CK was 196, CK-MB 1.6, troponin less than 0.01.  Preoperative urinalysis showed small amount leukocyte esterase with 0-2  WBCs seen per high-powered field.  This was repeated which showed  moderate amount of hemoglobin, small bilirubin, trace ketones, 100 mg/dL  protein, large amount of leukocyte esterase and 11-20 WBCs were seen per  high-powered field on the repeat urinalysis and 3-6  RBCs were noted,  also noted were granular casts.  Another urinalysis was obtained on July  1, which showed a decreased amount leukocyte esterase, 3-6 WBC seen per  high power field.  Urine culture showed no growth.  The patient was  treated with Cipro as well as Rocephin.  Preoperative EKG showed normal  sinus rhythm, nonspecific ST abnormalities were noted. No significant  change since last tracing.  Postoperative EKG was obtained secondary to  severe heartburn, which showed no change.  Preoperative chest x-ray  showed peribronchial thickening. No active disease. Renal ultrasound  showed normal sized kidneys,  no hydronephrosis, left renal cysts were  noted.   HOSPITAL COURSE:  The patient was admitted, taken to the OR and  underwent the above stated procedure.  Fortunately, [name] did have a dural  tear.  This was repaired. [name] was then moved to the PACU and then to  step down  unit for observation.  The patient's head of bed was kept flat  for the first 24 hours. Postoperative day #1, the patient noted no lower  extremity pain.  [name] denied any headache.  [name] had slightly elevated  blood pressure. [name] was having good urine output. Dressing was clean,  dry and intact.  Calves were soft, nontender without evidence of DVT. At  this time, IV fluids were decreased. Bedrest with continuous incentive  spirometer was encouraged.  Diet was advanced to clear liquids.  Postoperative day #3, the patient continued no headache, no chest pain,  shortness of breath was noted.  Vital signs were stable.  [name] was  afebrile.  Sodium was slightly decreased at 133, potassium 4.0.  Head of  bed was progressed to 60 degrees. The patient did not have any headache  therefore we continued to 90 degrees without headache.  Unfortunately,  her appetite was fair to poor so [name] was getting terrible p.o. intake.  Postoperative day #3, the patient was continuing to complain of nausea  and severe heartburn. EKG was obtained which showed no acute  abnormalities.  Unfortunately, looking back over the past 8 hours, the  patient had poor urinary output, was only 210 in 8 hours.  [name] was  febrile 101.8.  Incision was clean, dry with only scant amount of  drainage.  Motor and neurovascular function was intact to lower  extremities. At this point, we did start gentle hydration, incentive  spirometer was encouraged.  We did check a UA, which did come back  positive for urinary tract infection.  Cipro was started. The patient's  renal function did progressively worsen over the course of the next  couple of days at which point [facility] was consulted to help manage. At  this point, we discontinued her ACE inhibitor as well as her  hydrochlorothiazide and gentle hydration was continued. Medications were  adjusted accordingly.  Again, [name] was continued with Cipro for urinary  tract infection. Over the course  of a few days, the patient continued to  improve. Lovenox was used for DVT prophylaxis.  We did consult rehab for  placement;  they did accept her.  There, the patient continued to  progress slowly and did better.  Renal function was slowly improving  with gentle hydration.  Renal ultrasound was negative.  Leukocytosis was  resolving. Repeat urinalysis showed the urine was clear. On July 1,  again renal failure with significantly improved with gentle hydration as  well as removal of medications. Hypertension was stable.  Leukocytosis  was resolving; repeat urinalysis  negative as well as cultures.  Hyponatremia was improved secondary to discontinued hydrochlorothiazide.  On July 2, it was felt the patient was stable for discharge to rehab.  This was agreed upon by ortho as well as [facility], all medical issues  were improving.  Orthopedically, [name] was stable.  No signs and symptoms  of a dural leak were noted.   DISPOSITION:  The patient discharged to our rehab unit.  [name] will need  follow-up with Dr. Shelle Iron in approximately 2 weeks.  They can remove  staples in rehab if [name] is there longer. Wound care, keep this area  clean, dry, change her dressing daily.  [name] is to ambulate as tolerated,  back precautions and diet with high fiber.  They will need to keep a  close eye on her renal function.   MEDICATIONS:  All home medications.  May need to hold ACE inhibitor  until renal function significantly improves.   CONDITION ON DISCHARGE TO REHAB:  Was improved and guarded.   DIAGNOSIS OF DISCHARGE:  Status post lumbar decompression 3-4, 4-5.      Roma Schanz, P.A.      Jene Every, M.D.  Electronically Signed    CS/MEDQ  D:  04/05/2008  T:  04/05/2008  Job:  [id number]

## 2011-01-31 NOTE — H&P (Signed)
NAMEJACK, MINEAU              ACCOUNT NO.:  [id number]   MEDICAL RECORD NO.:  [id number]          PATIENT TYPE:  INP   LOCATION:  1824                         FACILITY:  [facility]   PHYSICIAN:  Leonie Man, M.D.   DATE OF BIRTH:  16-Jul-1931   DATE OF ADMISSION:  02/18/2005  DATE OF DISCHARGE:                                HISTORY & PHYSICAL   CHIEF COMPLAINT:  Right upper quadrant abdominal pain.   HISTORY OF PRESENT ILLNESS:  Ms. Fontenot is a 77 year old female with a  several-month history of right upper quadrant pain that has been  intermittent.  Her last episode occurred 2 weeks ago.  This morning around 4  a.m., she awoke with severe right upper quadrant pain.  She proceeded to the  emergency room.  This pain was associated with nausea and vomiting, and some  constipation.  An abdominal ultrasound in the emergency room revealed  cholelithiasis and gallbladder wall thickening.  Lab studies revealed  leukocytosis with a white count near 17,000.   Her past medical history is significant for angina and she underwent a  cardiac catheterization years ago by Dr. Charlies Constable.  She thinks that  she has some blockages at the bottom of her heart.   PAST MEDICAL HISTORY:  1.  Angina.  2.  Rapid heart beat.  3.  Panic attacks.  4.  Hypertension.  5.  Hypercholesterolemia.   PAST SURGICAL HISTORY:  1.  BTL.  2.  Right total knee replacement.   ALLERGIES:  PENICILLIN.   MEDICATIONS:  1.  Lipitor 20 mg a day.  2.  Cartia XT 240 mg a day.  3.  Celebrex 200 mg a day.  4.  Quinaretic 20/12.5 mg b.i.d.  5.  Hydrocodone/APAP 10/650 mg one-half to one tablet 4 times daily p.r.n.   SOCIAL HISTORY:  She lives in [place].  She works as a Futures trader.  She is  married.  She has 6 kids; however, 1 is deceased.  She denies any tobacco or  illicit drug use, but she does occasionally drink alcohol.  She tries to  follow a low-fat diet.   FAMILY HISTORY:  Mom is deceased for unknown  reasons.  Her father died  secondary to a gunshot wound.   REVIEW OF SYSTEMS:  Chest pain, nausea, vomiting, abdominal pain of the  right upper quadrant.  All other systems are negative.   PHYSICAL EXAMINATION:  VITAL SIGNS:  Temperature 95.6, pulse 100,  respirations 24, blood pressure 177/78, O2 saturation is 98% on room air.  GENERAL:  She is in no acute distress.  HEENT:  Grossly normal.  Nares without discharge.  Sclerae are clear.  Conjunctivae normal.  NECK:  No carotid or subclavian bruits.  HEART:  Regular rate and rhythm.  No evidence of murmur, rub or ectopy.  LUNGS:  Lungs clear to auscultation bilaterally, no wheezing or rhonchi.  SKIN:  Warm and dry.  ABDOMEN:  Right upper quadrant tenderness.  Good bowel sounds.  Non-  distended.  No rebound or guarding.  No scars or hernias visible.  EXTREMITIES:  No cyanosis, clubbing  or edema.  MUSCULOSKELETAL:  No joint deformity.  He has a right total knee  replacement.  No CVA tenderness.  NEUROLOGIC:  Cranial nerves II-XII grossly intact.  Alert and oriented x3.   LABORATORY STUDIES:  White count 16,400, hemoglobin 12.4, hematocrit 36.6,  platelets 342,000.  BUN 22, creatinine 0.9, total bilirubin 0.8, AST and ALT  normal.  Her alkaline phosphatase was 152.  Amylase and lipase were normal.  Urinalysis reveals positive leukocyte esterase.   ASSESSMENT AND PLAN:  1.  Acute cholecystitis.  2.  Urinary tract infection.  3.  Hypertension.  4.  Hyperlipidemia.  5.  History of panic attacks.   PLAN:  We will place the patient on IV Cipro to treat her acute  cholecystitis as well as urinary tract infections.  Our plan is to proceed  with laparoscopic cholecystectomy with intraoperative cholangiogram tomorrow  morning.  Prior to surgery, she will need cardiac clearance; I have called  [facility] for this.       LB/MEDQ  D:  02/18/2005  T:  02/18/2005  Job:  [id number]   cc:   Gretta Arab. Valentina Lucks, M.D.  [address]  [place]  [place] [address]  Fax: [phone]   Charlies Constable, M.D. [facility]

## 2011-01-31 NOTE — Assessment & Plan Note (Signed)
[facility]                            CARDIOLOGY OFFICE NOTE   IARA, MONDS                     MRN:          [id number]  DATE:12/11/2006                            DOB:          1931-03-25    PRIMARY:  Dr. Consuella Lose C. Griffin.   REASON FOR PRESENTATION:  Evaluate the patient with coronary artery  disease.  She is pre-op for knee replacement.   HISTORY OF PRESENT ILLNESS:  The patient is a lovely 77 year old white  female.  She is due to have left knee replacement by Dr. Jene Every.  She does have a cardiac history with coronary artery disease diagnosed  greater than 12 years ago.  She had a catheterization.  An old note from  Dr. Jone Baseman files demonstrates a 30-40% LAD lesion.  She had a distal  95% right coronary artery lesion.  This was managed medically.  She was  last seen by [place] preoperatively in 2006 when she was hospitalized with  cholelithiasis.  It was not felt at that time that she needed  preoperative evaluation, and she went ahead and had her surgery acutely.  She did well with this.   Now, she is more limited than she was in 2006.  She gets around a little  bit in her house.  She does some light housekeeping, but cannot do  anything above a mild functional level (less than 4 METS).  With this  level of activity, she does not get any chest discomfort, neck  discomfort, arm discomfort, activity-induced nausea or vomiting, or  excessive diaphoresis.  She had no palpation, pre-syncope, or syncope.  She does get fatigued more than she used to.   PAST MEDICAL HISTORY:  Hypertension x5 years, hyperlipidemia x3 years.   PAST SURGICAL HISTORY:  Cholecystectomy, right total knee replacement in  1995.   ALLERGIES:  PENICILLIN.   MEDICATIONS:  1. Oxycodone p.r.n.  2. Celebrex 200 mg daily.  3. Simvastatin 80 mg daily.  4. Cartia XT 360 mg daily.  5. Quinaretic 20/12.5 b.i.d.  6. Hydrocodone.  7. Alprazolam.   SOCIAL  HISTORY:  The patient is retired.  She is married.  She has 6  children, 5 still living.  She never smoked cigarettes.  She drinks two  12 ounce beers a day.   FAMILY HISTORY:  Noncontributory for early coronary artery disease.   REVIEW OF SYSTEMS:  Positive for difficulty hearing, leg cramps, pain in  her back and spine, which limits her.  Varicose veins.  Negative for  other systems.   PHYSICAL EXAMINATION:  The patient is in no distress.  Blood pressure 142/68, heart rate 79 and regular, weight 214 pounds,  body mass index 37.  HEENT:  Eyelids unremarkable.  Pupils are equal, round, and reactive to  light and accommodation.  Fundi not visualized.  Oral mucosa  unremarkable.  NECK:  No jugular venous distension at 45 degrees.  Carotid upstroke  brisk and symmetric, left carotid bruit.  No thyromegaly.  LYMPHATICS:  No cervical, axillary, or inguinal adenopathy.  LUNGS:  Clear to auscultation bilaterally.  BACK:  No  costovertebral angle tenderness.  CHEST:  Unremarkable.  HEART:  PMI not displaced or sustained, S1 and S2 within normal limits,  no S3, no S4, no clicks, rubs, or murmurs.  ABDOMEN:  Morbidly obese, positive bowel sounds, normal in frequency and  pitch, no bruits, rebound, guarding.  No midline pulsatile masses, no  hepatomegaly, splenomegaly.  SKIN:  No rashes, no nodules.  EXTREMITIES:  With 2+ pulses throughout, no edema, cyanosis, clubbing.  NEURO:  Oriented to person, place, and time, cranial nerves 2-12 grossly  intact, motor grossly intact throughout.   EKG:  Sinus rhythm, rate 79, axis within normal limits, intervals within  normal limits, no acute ST-T wave changes.   ASSESSMENT AND PLAN:  1. Coronary artery disease.  The patient did have coronary artery      disease as described.  She has a very low functional status.  She      is going for a moderate risk procedure.  Based on ACC/AHA      guidelines, further cardiovascular testing is indicated and she       will have an adenosine perfusion study.  She would not be able to      ambulate on a treadmill.  Further risk stratification will be based      on these results.  2. Carotid bruit.  The patient does have a bruit.  I will get a      carotid Doppler the day she comes back for her stress perfusion      study.  3. Followup will be as needed based on the above results.     Rollene Rotunda, MD, [facility]  Electronically Signed    JH/MedQ  DD: 12/11/2006  DT: 12/11/2006  Job #: [id number]   cc:   Jene Every, M.D.  Gretta Arab Valentina Lucks, M.D.

## 2011-01-31 NOTE — Letter (Signed)
December 21, 2006    Jene Every, M.D.  [address]  [place]  [place] [address]   RE:  [name], [name]  MRN:  [id number]  /  DOB:  [birth year]   Dear Dr. Shelle Iron:   This letter is in regards to [name].  She was sent for  preoperative evaluation prior to knee surgery.  She had a history of  coronary disease as described in the clinic note.  I did send her for a  stress perfusion study.  I am happy to see that this demonstrates no  evidence of ischemia or infarct.  Her ejection fraction is 68%.  Based  on this and current ACC/AHA guidelines, no further cardiac vascular  testing is suggested.  She will remain on the medications as listed.   If you have any questions regarding this please do not hesitate to give  me a call.  My cell number is [phone].  If you have any difficulty  obtaining the clinic notes, please note that [facility] notes  are available on E-chart.    Sincerely,      Rollene Rotunda, MD, [facility]  Electronically Signed    JH/MedQ  DD: 12/21/2006  DT: 12/21/2006  Job #: [id number]   CC:    Gretta Arab. Valentina Lucks, M.D.

## 2011-01-31 NOTE — Consult Note (Signed)
[name], [name]              ACCOUNT NO.:  [id number]   MEDICAL RECORD NO.:  [id number]          PATIENT TYPE:  INP   LOCATION:  1824                         FACILITY:  [facility]   PHYSICIAN:  Jonelle Sidle, M.D. LHCDATE OF BIRTH:  1931/01/18   DATE OF CONSULTATION:  02/18/2005  DATE OF DISCHARGE:                                   CONSULTATION   PRIMARY CARE PHYSICIAN:  Gretta Arab. Valentina Lucks, M.D.   CARDIOLOGIST:  Charlies Constable, M.D. [facility].   REASON FOR CONSULTATION:  Preoperative evaluation.   HISTORY OF PRESENT ILLNESS:  [name] is a pleasant 77 year old woman  with a history of hypertension, hyperlipidemia, palpitations, and arthritis,  status post right total knee replacement.  She has a prior history of  intermittent chest pain also associated with palpitations and apparently was  ultimately diagnosed with panic attacks.  She underwent previously  cardiology evaluation approximately 10 years ago by Dr. Juanda Chance at which time  she had a cardiac catheterization showing no obstructive coronary artery  disease.  She has had no specific cardiac testing since that time, but  denies having any recent chest pain, shortness of breath with activity, or  palpitations.  In terms of functional status, she is somewhat limited by her  arthritis, but is able to achieve a maximum workload of 4 medicines based on  her description.  She can climb 12 stairs at home without stopping and also  dose chores around the house including limited mopping, sweeping, and  cooking as well as changing her bed.  Her electrocardiogram today shows  normal sinus rhythm at 65 beats per minute.   ALLERGIES:  PENICILLIN.   CURRENT MEDICATIONS:  1.  Lipitor 20 mg p.o. daily.  2.  Cardia XT 240 mg p.o. daily.  3.  Celebrex 20 mg p.o. daily.  4.  Quinaretic 20/12.5 mg p.o. b.i.d.  5.  Hydrocodone 10/650 mg 1/2 to 1 tablet p.o. p.r.n.   PAST MEDICAL HISTORY:  1.  Hypertension.  2.  Hyperlipidemia, on statin  therapy.  3.  History of panic attacks with previous symptoms of chest pain and      palpitations.  4.  History of nonobstructive coronary atherosclerosis by cardiac      catheterization approximately 10 years ago.  The patient has not had any      recent ischemic testing, although, has not had any progressive symptoms.  5.  Osteoarthritis, status post previous right total knee replacement.  6.  Status post bilateral tubal ligation.   SOCIAL HISTORY:  The patient lives in [place] with her husband.  She is a  Futures trader.  She has six children, one of which is deceased.  She drinks  alcohol occasionally, but denies any tobacco use.   FAMILY HISTORY:  Significant for the death of the patient's father from a  gunshot wound.  Mother died of unknown causes.   REVIEW OF SYSTEMS:  As described in the history of present illness.  Her  reason for presentation was right upper quadrant discomfort that has been  waxing and waning, sometimes associated with meals.  She has  been diagnosed  with cholelithiasis and is being considered for cholecystectomy tomorrow  morning.  Systems otherwise negative.   PHYSICAL EXAMINATION:  VITAL SIGNS:  Blood pressure 144/67, heart rate 64,  respirations 22, temperature 97.6 degrees, oxygen saturation is 97% on room  air.  GENERAL:  This is an obese woman seated in no acute distress.  HEENT:  Conjunctivae and lids are normal.  Oropharynx is clear.  NECK:  Supple without elevated jugular venous pressure or loud carotid  bruits.  No thyromegaly is noted.  LUNGS:  Clear with no labored breathing at rest.  HEART:  Regular rate and rhythm with a 1/6 systolic murmur heard best at the  right base without radiation.  There is no S3 gallop or pericardial rub.  ABDOMEN:  Morbidly obese with some mild soreness on palpation of the upper  quadrants, but no guarding or rebound.  Bowel sounds are normal.  EXTREMITIES:  No pitting edema.  There are some varicosities noted.   Pulses  are 2+ and equal.  MUSCULOSKELETAL:  No kyphosis is noted.  NEUROPSYCHIATRIC:  The patient is alert and oriented x3.   LABORATORY DATA:  WBC 16.4, hemoglobin 12.4, platelets 342, sodium 139,  potassium 4.3, BUN 22, creatinine 0.9, glucose 135.  AST 28, ALT 26.   IMPRESSION:  1.  Presentation with right upper quadrant pain with subsequent diagnosis of      cholelithiasis, now planned for cholecystectomy in the morning.  We have      been asked to evaluate the patient preoperatively from a cardiac      perspective.  She has a previously documented history of nonobstructive      coronary atherosclerosis documented at catheterization approximately 10      years ago.  She has had no progressive symptoms and in fact, at this      time, is reporting no exertional chest pain or shortness of breath with      activities of daily living including activities consistent with a      workload of 4 mets.  Electrocardiogram at rest is normal.  2.  Hypertension.  3.  Hyperlipidemia on statin therapy.  4.  History of panic attacks, manifested by palpitations and chest pain in      the past.  5.  Osteoarthritis, status post previous right total knee replacement.   RECOMMENDATIONS:  1.  Can proceed with surgery without additional cardiac testing at this      point.  Would suggest changing from calcium channel blocker to beta      blocker therapy particularly in the perioperative setting.  I discussed      this with the patient and our plan will be to transition from cardia XT      to Lopressor for the time being.  If this is effective for the patient's      hypertension control and previous palpitations, could consider leaving      this change in place.  2.  Follow-up postoperative electrocardiogram.  3.  Continue remaining medications.  4.  Will follow with you.      SGM/MEDQ  D:  02/18/2005  T:  02/18/2005  Job:  [id number]   cc:   Gretta Arab. Valentina Lucks, M.D.  [address] [place]  [place] [address]  Fax: [phone]   Charlies Constable, M.D. [facility]

## 2011-01-31 NOTE — Op Note (Signed)
NAMEJAYLAA, [name]              ACCOUNT NO.:  [id number]   MEDICAL RECORD NO.:  [id number]          PATIENT TYPE:  INP   LOCATION:  5731                         FACILITY:  [facility]   PHYSICIAN:  Leonie Man, M.D.   DATE OF BIRTH:  21-Sep-1930   DATE OF PROCEDURE:  02/19/2005  DATE OF DISCHARGE:                                 OPERATIVE REPORT   PREOPERATIVE DIAGNOSIS:  Chronic calculous cholecystitis.   POSTOPERATIVE DIAGNOSIS:  Chronic calculous cholecystitis.   PROCEDURES:  Laparoscopic cholecystectomy with intraoperative cholangiogram.   SURGEON:  Leonie Man, M.D.   ASSISTANT:  Gita Kudo, M.D.   ANESTHESIA:  General.   SPECIMENS TO THE LABORATORY:  Gallbladder.   ESTIMATED BLOOD LOSS:  Minimal.   COMPLICATIONS:  There were no apparent complications during this operation.  The patient was returned to the PACU in good condition.   NOTE:  The patient is a 77 year old lady admitted on February 18, 2005, with  acute right upper quadrant pain and associated nausea and vomiting. She had  a leukocytosis of 16,000 with a left shift. She was afebrile. She had an  ultrasound which showed cholelithiasis with a thickened gallbladder wall.  The patient now comes to the operating room after the risks and potential  benefits of surgery have been discussed. All questions answered and consent  obtained.   DESCRIPTION OF PROCEDURE:  Following the induction of satisfactory  anesthesia with the patient positioned supinely, the abdomen was prepped and  draped to be included in a sterile operative field. Open laparoscopy created  at the umbilicus with insertion of a Hassan cannula. Insufflation of the  peritoneal cavity to 14 mmHg pressure using carbon dioxide. The camera was  inserted. Visual exploration of the abdomen carried out. The gallbladder was  noted to be chronically scarred with multiple adhesions to the wall of the  gallbladder. The liver edges were sharp and liver  surfaces smooth. None of  the large and small intestine viewed and appeared to be abnormal. Pelvic  organs were not visualized. Anterior gastric wall and duodenal sweep was  normal.   Under direct vision, epigastric and lateral ports were placed. The  gallbladder is grasped and retracted cephalad. Adhesions were taken down  from the wall of gallbladder, carrying the dissection down to the ampulla of  the gallbladder. In this region, the cystic artery and cystic duct were  isolated. The cystic duct was traced up to its entry into the cystic duct  gallbladder junction and the cystic artery traced to its entry into the  gallbladder wall. The cystic artery was doubly clipped and then transected.  The cystic duct was clipped proximally and opened. Cystic duct cholangiogram  was then carried out by passing a Cook catheter into the abdomen and placing  this in the cystic duct and injecting one-half strength Hypaque under  fluoroscopic guidance. The resulting cholangiogram showed free flow of  contrast into the duodenum. No filling defects. The cystic duct was noted to  be dilated. The cystic duct catheter was removed. The cystic duct was triply  clipped and transected and the gallbladder  then dissected free from the  liver bed using electrocautery and maintaining hemostasis throughout the  course of the dissection. At the end of the dissection, the liver bed was  thoroughly irrigated and additional bleeding points treated with  electrocautery. Sponge and instrument counts were verified. The  pneumoperitoneum was released following removal of the trocars under direct  vision. The abdominal wall was then closed in the layers as follows:  The  umbilical wound in two layers with 0 Vicryl and 4-0 Monocryl. The  epigastrium and lateral ports were closed with 4-0 Monocryl sutures. All  wounds reinforced with Steri-Strips. Sterile dressings applied. Anesthetic  reversed. The patient was moved from the  operating room to the recovery room  in stable condition. She tolerated the procedure well.       PB/MEDQ  D:  02/19/2005  T:  02/19/2005  Job:  [id number]

## 2011-01-31 NOTE — H&P (Signed)
[name], [name]              ACCOUNT NO.:  [id number]   MEDICAL RECORD NO.:  [id number]          PATIENT TYPE:  INP   LOCATION:  1612                         [facility]ACILITY:  [facility]   PHYSICIAN:  Jene Every, M.D.    DATE OF BIRTH:  1931-07-03   DATE OF ADMISSION:  03/09/2008  DATE OF DISCHARGE:  03/16/2008                              HISTORY & PHYSICAL   CHIEF COMPLAINT:  Disabling lower extremity pain with walking.   HISTORY:  [name] is a pleasant 77 year old female who has noted  progressive disabling symptoms down both lower extremities with  ambulating.  Studies were obtained, including a myelogram, which showed  significant neural compression on the right at 3-4 and 4-5 as well as  the concavity of her scoliosis is exasperating her symptoms.  The  patient underwent conservative treatment but failed.  At this point,  pain was pretty disabling for her, so it was felt she would benefit from  a lumbar decompression.  The risks and benefits of the surgery were  obtained.  Medical clearance was obtained as well.  She does elect to  proceed.   MEDICAL HISTORY:  Significant for coronary artery disease, hypertension,  hyperlipidemia, status post cholecystectomy, status post total knee  arthroplasty, history of panic attacks, bilateral tubal ligation.   HOME MEDICATIONS:  Include Celebrex, simvastatin, aspirin, quinapril,  hydrocodone.   ALLERGIES:  PENICILLIN.   SOCIAL HISTORY:  The patient is retired.  She is married.  She has 6  children.  History negative for tobacco.  She does admit to alcohol  consumption.   REVIEW OF SYSTEMS:  GENERAL:  The patient denies any fever, chills,  night sweats or bleeding tendencies.  CENTRAL NERVOUS SYSTEM:  No  blurred or double vision, seizure or paralysis.  RESPIRATORY:  No  shortness of breath, productive cough or hemoptysis.  CARDIOVASCULAR:  Denies any angina or orthopnea.  GENITOURINARY:  No dysuria, hematuria,  or discharge.   GASTROINTESTINAL:  No nausea, vomiting, diarrhea,  constipation, melena or bloody stools.  MUSCULOSKELETAL:  As per HPI.   PHYSICAL EXAMINATION:  GENERAL:  This is an overweight female who is in  moderate distress.  She has difficulty ambulating.  HEENT:  Atraumatic, normocephalic.  Pupils equal, round and  reactive to  light.  EOMs intact.  NECK:  Supple.  No lymphadenopathy.  CHEST:  Clear to auscultation bilaterally.  No rhonchi, wheezes or  rales.  HEART:  Regular rate and rhythm with no murmurs, gallops or rubs.  ABDOMEN:  Soft, nontender, nondistended.  Bowel sounds x 4.  SKIN:  No  rashes or lesions noted.  LOWER EXTREMITIES:  She has negative straight leg raise bilaterally.  Sensation is intact.  Motor is 5/5.  She is normoflexive.   IMPRESSION:  Severe stenosis, 3-4, 4-5.   PLAN:  The patient will be admitted to [facility] to undergo  lumbar decompression at 3-4, 4-5.      Roma Schanz, P.A.      Jene Every, M.D.  Electronically Signed    CS/MEDQ  D:  04/05/2008  T:  04/05/2008  Job:  [id number] 

## 2011-06-12 LAB — URINALYSIS, ROUTINE W REFLEX MICROSCOPIC
Bilirubin Urine: NEGATIVE
Bilirubin Urine: NEGATIVE
Glucose, UA: NEGATIVE
Hgb urine dipstick: NEGATIVE
Ketones, ur: NEGATIVE
Nitrite: NEGATIVE
Nitrite: NEGATIVE
Protein, ur: 100 — AB
Protein, ur: NEGATIVE
Protein, ur: NEGATIVE
Specific Gravity, Urine: 1.011
Urobilinogen, UA: 1
Urobilinogen, UA: 0.2

## 2011-06-12 LAB — CBC
HCT: 28.6 — ABNORMAL LOW
HCT: 27.9 — ABNORMAL LOW
HCT: 29.2 — ABNORMAL LOW
Hemoglobin: 10.6 — ABNORMAL LOW
Hemoglobin: 12
Hemoglobin: 12.5
Hemoglobin: 9.6 — ABNORMAL LOW
Hemoglobin: 9.7 — ABNORMAL LOW
MCHC: 33.7
MCHC: 34
MCHC: 33.7
MCHC: 33.2
MCV: 94.9
MCV: 95
MCV: 95.4
MCV: 95.7
MCV: 96.3
MCV: 96.4
Platelets: 213
Platelets: 244
RBC: 3.32 — ABNORMAL LOW
RBC: 3.73 — ABNORMAL LOW
RBC: 3.92
RBC: 2.89 — ABNORMAL LOW
RBC: 3.03 — ABNORMAL LOW
RDW: 12.7
RDW: 13.1
RDW: 13.8
RDW: 14.2
WBC: 12.6 — ABNORMAL HIGH
WBC: 12.7 — ABNORMAL HIGH
WBC: 7.3
WBC: 13.3 — ABNORMAL HIGH

## 2011-06-12 LAB — BASIC METABOLIC PANEL
BUN: 45 — ABNORMAL HIGH
BUN: 45 — ABNORMAL HIGH
CO2: 27
CO2: 28
CO2: 29
CO2: 21
Calcium: 7.9 — ABNORMAL LOW
Calcium: 8 — ABNORMAL LOW
Calcium: 8.7
Chloride: 100
Chloride: 102
Chloride: 104
Chloride: 108
Chloride: 112
Creatinine, Ser: 0.71
Creatinine, Ser: 1.01
Creatinine, Ser: 2.44 — ABNORMAL HIGH
Creatinine, Ser: 2.85 — ABNORMAL HIGH
GFR calc Af Amer: 22 — ABNORMAL LOW
GFR calc Af Amer: 23 — ABNORMAL LOW
GFR calc Af Amer: >60
GFR calc Af Amer: >60
GFR calc Af Amer: >60
GFR calc Af Amer: 24 — ABNORMAL LOW
GFR calc non Af Amer: 18 — ABNORMAL LOW
GFR calc non Af Amer: 52 — ABNORMAL LOW
Glucose, Bld: 160 — ABNORMAL HIGH
Glucose, Bld: 168 — ABNORMAL HIGH
Glucose, Bld: 100 — ABNORMAL HIGH
Glucose, Bld: 100 — ABNORMAL HIGH
Glucose, Bld: 99
Potassium: 4.7
Potassium: 3.6
Potassium: 3.7
Potassium: 3.9
Sodium: 130 — ABNORMAL LOW
Sodium: 136
Sodium: 140
Sodium: 136
Sodium: 138

## 2011-06-12 LAB — COMPREHENSIVE METABOLIC PANEL
AST: 23
BUN: 31 — ABNORMAL HIGH
Calcium: 9.9
Calcium: 8.9
Creatinine, Ser: 0.84
Creatinine, Ser: 1.84 — ABNORMAL HIGH
GFR calc Af Amer: >60
GFR calc non Af Amer: >60
Glucose, Bld: 101 — ABNORMAL HIGH
Sodium: 141
Total Bilirubin: 0.8
Total Protein: 5.2 — ABNORMAL LOW

## 2011-06-12 LAB — URINE CULTURE
Colony Count: NO GROWTH
Colony Count: NO GROWTH
Culture: NO GROWTH
Culture: NO GROWTH
Special Requests: NEGATIVE

## 2011-06-12 LAB — URINE MICROSCOPIC-ADD ON

## 2011-06-12 LAB — PROTIME-INR
INR: 0.9
Prothrombin Time: 12.8

## 2011-06-12 LAB — SODIUM, URINE, RANDOM: Sodium, Ur: 12

## 2011-06-12 LAB — DIFFERENTIAL
Lymphs Abs: 4.1 — ABNORMAL HIGH
Monocytes Relative: 2 — ABNORMAL LOW
Neutro Abs: 9 — ABNORMAL HIGH
Neutrophils Relative %: 56

## 2011-06-12 LAB — CARDIAC PANEL(CRET KIN+CKTOT+MB+TROPI)
Relative Index: 0.9
Total CK: 151
Troponin I: 0.02
Troponin I: <0.01

## 2011-06-12 LAB — WET PREP, GENITAL: Clue Cells Wet Prep HPF POC: NONE SEEN

## 2011-06-12 LAB — CREATININE, URINE, RANDOM: Creatinine, Urine: 37.1

## 2011-06-12 LAB — OSMOLALITY, URINE: Osmolality, Ur: 198 — ABNORMAL LOW

## 2011-07-03 LAB — BASIC METABOLIC PANEL
BUN: 12
BUN: 13
BUN: 13
BUN: 15
CO2: 29
Calcium: 8.6
Calcium: 8.9
Calcium: 9
Calcium: 9.4
Chloride: 102
Creatinine, Ser: 0.66
Creatinine, Ser: 0.79
Creatinine, Ser: 0.93
GFR calc Af Amer: >60
GFR calc Af Amer: >60
GFR calc Af Amer: >60
GFR calc non Af Amer: >60
GFR calc non Af Amer: >60
GFR calc non Af Amer: >60
GFR calc non Af Amer: >60
Glucose, Bld: 122 — ABNORMAL HIGH
Glucose, Bld: 127 — ABNORMAL HIGH
Potassium: 4.4
Sodium: 135
Sodium: 139
Sodium: 141

## 2011-07-03 LAB — CBC
HCT: 27.4 — ABNORMAL LOW
Hemoglobin: 10.4 — ABNORMAL LOW
Hemoglobin: 9.7 — ABNORMAL LOW
MCHC: 34
MCHC: 34.2
MCHC: 34.6
MCV: 94.9
Platelets: 202
Platelets: 217
Platelets: 236
Platelets: 311
RBC: 2.82 — ABNORMAL LOW
RBC: 2.91 — ABNORMAL LOW
RBC: 3.01 — ABNORMAL LOW
RDW: 13
RDW: 13.2
WBC: 8.2
WBC: 8.6
WBC: 9.3
WBC: 9.4

## 2011-07-03 LAB — PROTIME-INR
INR: 1
INR: 1.4
INR: 1.4
INR: 1.7 — ABNORMAL HIGH
Prothrombin Time: 17.2 — ABNORMAL HIGH
Prothrombin Time: 20.2 — ABNORMAL HIGH

## 2012-09-09 ENCOUNTER — Ambulatory Visit
Admission: RE | Admit: 2012-09-09 | Discharge: 2012-09-09 | Disposition: A | Discharge status: Home / Self Care | Payer: Medicare Other | Source: Ambulatory Visit | Attending: Cardiology | Admitting: Cardiology

## 2012-09-09 ENCOUNTER — Other Ambulatory Visit: Payer: Self-pay | Admitting: Cardiology

## 2012-09-09 DIAGNOSIS — R600 Localized edema: Secondary | ICD-10-CM

## 2012-12-25 ENCOUNTER — Emergency Department (HOSPITAL_COMMUNITY): Payer: Medicare Other

## 2012-12-25 ENCOUNTER — Inpatient Hospital Stay (HOSPITAL_COMMUNITY)
Admission: EM | Admit: 2012-12-25 | Discharge: 2013-01-03 | DRG: 472 | Disposition: A | Discharge status: Skilled Nursing Facility | Payer: Medicare Other | Attending: Internal Medicine | Admitting: Internal Medicine

## 2012-12-25 ENCOUNTER — Encounter (HOSPITAL_COMMUNITY): Payer: Self-pay | Admitting: *Deleted

## 2012-12-25 DIAGNOSIS — E871 Hypo-osmolality and hyponatremia: Secondary | ICD-10-CM | POA: Diagnosis present

## 2012-12-25 DIAGNOSIS — M549 Dorsalgia, unspecified: Secondary | ICD-10-CM

## 2012-12-25 DIAGNOSIS — M25552 Pain in left hip: Secondary | ICD-10-CM

## 2012-12-25 DIAGNOSIS — I5032 Chronic diastolic (congestive) heart failure: Secondary | ICD-10-CM | POA: Diagnosis present

## 2012-12-25 DIAGNOSIS — I509 Heart failure, unspecified: Secondary | ICD-10-CM

## 2012-12-25 DIAGNOSIS — I10 Essential (primary) hypertension: Secondary | ICD-10-CM

## 2012-12-25 DIAGNOSIS — W19XXXA Unspecified fall, initial encounter: Secondary | ICD-10-CM

## 2012-12-25 DIAGNOSIS — S129XXD Fracture of neck, unspecified, subsequent encounter: Secondary | ICD-10-CM

## 2012-12-25 DIAGNOSIS — S12400A Unspecified displaced fracture of fifth cervical vertebra, initial encounter for closed fracture: Secondary | ICD-10-CM | POA: Diagnosis present

## 2012-12-25 DIAGNOSIS — M810 Age-related osteoporosis without current pathological fracture: Secondary | ICD-10-CM

## 2012-12-25 DIAGNOSIS — N179 Acute kidney failure, unspecified: Secondary | ICD-10-CM

## 2012-12-25 DIAGNOSIS — M6282 Rhabdomyolysis: Secondary | ICD-10-CM | POA: Diagnosis present

## 2012-12-25 DIAGNOSIS — Z7982 Long term (current) use of aspirin: Secondary | ICD-10-CM

## 2012-12-25 DIAGNOSIS — N319 Neuromuscular dysfunction of bladder, unspecified: Secondary | ICD-10-CM | POA: Diagnosis not present

## 2012-12-25 DIAGNOSIS — W06XXXA Fall from bed, initial encounter: Secondary | ICD-10-CM | POA: Diagnosis present

## 2012-12-25 DIAGNOSIS — S129XXS Fracture of neck, unspecified, sequela: Secondary | ICD-10-CM

## 2012-12-25 DIAGNOSIS — M4712 Other spondylosis with myelopathy, cervical region: Secondary | ICD-10-CM | POA: Diagnosis present

## 2012-12-25 DIAGNOSIS — Z79899 Other long term (current) drug therapy: Secondary | ICD-10-CM

## 2012-12-25 DIAGNOSIS — M48061 Spinal stenosis, lumbar region without neurogenic claudication: Secondary | ICD-10-CM

## 2012-12-25 DIAGNOSIS — I2510 Atherosclerotic heart disease of native coronary artery without angina pectoris: Secondary | ICD-10-CM

## 2012-12-25 DIAGNOSIS — M1990 Unspecified osteoarthritis, unspecified site: Secondary | ICD-10-CM | POA: Diagnosis present

## 2012-12-25 DIAGNOSIS — S7002XA Contusion of left hip, initial encounter: Secondary | ICD-10-CM

## 2012-12-25 DIAGNOSIS — M129 Arthropathy, unspecified: Secondary | ICD-10-CM | POA: Diagnosis present

## 2012-12-25 DIAGNOSIS — M25559 Pain in unspecified hip: Secondary | ICD-10-CM

## 2012-12-25 DIAGNOSIS — G8929 Other chronic pain: Secondary | ICD-10-CM

## 2012-12-25 DIAGNOSIS — Z23 Encounter for immunization: Secondary | ICD-10-CM

## 2012-12-25 DIAGNOSIS — S129XXA Fracture of neck, unspecified, initial encounter: Secondary | ICD-10-CM

## 2012-12-25 DIAGNOSIS — R339 Retention of urine, unspecified: Secondary | ICD-10-CM | POA: Diagnosis not present

## 2012-12-25 DIAGNOSIS — Z96659 Presence of unspecified artificial knee joint: Secondary | ICD-10-CM

## 2012-12-25 DIAGNOSIS — I8390 Asymptomatic varicose veins of unspecified lower extremity: Secondary | ICD-10-CM

## 2012-12-25 DIAGNOSIS — W19XXXD Unspecified fall, subsequent encounter: Secondary | ICD-10-CM

## 2012-12-25 DIAGNOSIS — D649 Anemia, unspecified: Secondary | ICD-10-CM

## 2012-12-25 DIAGNOSIS — G2581 Restless legs syndrome: Secondary | ICD-10-CM

## 2012-12-25 DIAGNOSIS — S12100A Unspecified displaced fracture of second cervical vertebra, initial encounter for closed fracture: Principal | ICD-10-CM | POA: Diagnosis present

## 2012-12-25 HISTORY — DX: Unspecified osteoarthritis, unspecified site: M19.90

## 2012-12-25 HISTORY — DX: Localized edema: R60.0

## 2012-12-25 HISTORY — DX: Atherosclerotic heart disease of native coronary artery without angina pectoris: I25.10

## 2012-12-25 HISTORY — DX: Heart failure, unspecified: I50.9

## 2012-12-25 HISTORY — DX: Essential (primary) hypertension: I10

## 2012-12-25 LAB — CBC WITH DIFFERENTIAL/PLATELET
Eosinophils Absolute: 0.3 10*3/uL (ref 0.0–0.7)
Eosinophils Relative: 4 % (ref 0–5)
HCT: 30.2 % — ABNORMAL LOW (ref 36.0–46.0)
Lymphocytes Relative: 19 % (ref 12–46)
Lymphs Abs: 1.4 10*3/uL (ref 0.7–4.0)
MCH: 31.3 pg (ref 26.0–34.0)
MCV: 91 fL (ref 78.0–100.0)
Monocytes Absolute: 0.5 10*3/uL (ref 0.1–1.0)
RBC: 3.32 MIL/uL — ABNORMAL LOW (ref 3.87–5.11)
WBC: 7.5 10*3/uL (ref 4.0–10.5)

## 2012-12-25 LAB — URINALYSIS, ROUTINE W REFLEX MICROSCOPIC
Bilirubin Urine: NEGATIVE
Glucose, UA: NEGATIVE mg/dL
Hgb urine dipstick: NEGATIVE
Ketones, ur: NEGATIVE mg/dL
Protein, ur: NEGATIVE mg/dL

## 2012-12-25 LAB — CK: Total CK: 2344 U/L — ABNORMAL HIGH (ref 7–177)

## 2012-12-25 LAB — BASIC METABOLIC PANEL
BUN: 50 mg/dL — ABNORMAL HIGH (ref 6–23)
CO2: 27 mEq/L (ref 19–32)
Calcium: 9.5 mg/dL (ref 8.4–10.5)
Creatinine, Ser: 1.56 mg/dL — ABNORMAL HIGH (ref 0.50–1.10)
Glucose, Bld: 93 mg/dL (ref 70–99)

## 2012-12-25 LAB — TROPONIN I: Troponin I: <0.3 ng/mL (ref <0.30)

## 2012-12-25 MED ORDER — CALCIUM CARBONATE 1250 (500 CA) MG PO TABS
1.0000 | ORAL_TABLET | Freq: Three times a day (TID) | ORAL | Status: DC
  Administered 2012-12-26 – 2013-01-03 (×25): 500 mg via ORAL
  Filled 2012-12-25 (×31): qty 1

## 2012-12-25 MED ORDER — ADULT MULTIVITAMIN W/MINERALS CH
1.0000 | ORAL_TABLET | Freq: Every day | ORAL | Status: DC
  Administered 2012-12-26 – 2013-01-03 (×8): 1 via ORAL
  Filled 2012-12-25 (×9): qty 1

## 2012-12-25 MED ORDER — ALUM & MAG HYDROXIDE-SIMETH 200-200-20 MG/5ML PO SUSP: 30.0000 mL | Freq: Four times a day (QID) | ORAL | Status: DC | PRN

## 2012-12-25 MED ORDER — SODIUM CHLORIDE 0.9 % IV SOLN
Freq: Once | INTRAVENOUS | Status: AC
  Administered 2012-12-25: 16:00:00 via INTRAVENOUS

## 2012-12-25 MED ORDER — ENOXAPARIN SODIUM 40 MG/0.4ML ~~LOC~~ SOLN
40.0000 mg | SUBCUTANEOUS | Status: DC
  Administered 2012-12-25 – 2012-12-26 (×2): 40 mg via SUBCUTANEOUS
  Filled 2012-12-25 (×3): qty 0.4

## 2012-12-25 MED ORDER — SODIUM CHLORIDE 0.9 % IV BOLUS (SEPSIS)
500.0000 mL | Freq: Once | INTRAVENOUS | Status: AC
  Administered 2012-12-25: 500 mL via INTRAVENOUS

## 2012-12-25 MED ORDER — LORAZEPAM 2 MG/ML IJ SOLN
0.5000 mg | Freq: Once | INTRAMUSCULAR | Status: AC
  Administered 2012-12-25: 0.5 mg via INTRAVENOUS
  Filled 2012-12-25: qty 1

## 2012-12-25 MED ORDER — MORPHINE SULFATE 2 MG/ML IJ SOLN
2.0000 mg | Freq: Once | INTRAMUSCULAR | Status: AC
  Administered 2012-12-25: 2 mg via INTRAVENOUS
  Filled 2012-12-25: qty 1

## 2012-12-25 MED ORDER — SIMVASTATIN 10 MG PO TABS
10.0000 mg | ORAL_TABLET | Freq: Every day | ORAL | Status: DC
  Administered 2012-12-25 – 2013-01-03 (×9): 10 mg via ORAL
  Filled 2012-12-25 (×10): qty 1

## 2012-12-25 MED ORDER — METOPROLOL TARTRATE 50 MG PO TABS
50.0000 mg | ORAL_TABLET | Freq: Two times a day (BID) | ORAL | Status: DC
  Administered 2012-12-25 – 2013-01-03 (×18): 50 mg via ORAL
  Filled 2012-12-25 (×21): qty 1

## 2012-12-25 MED ORDER — ONDANSETRON HCL 4 MG/2ML IJ SOLN
4.0000 mg | Freq: Once | INTRAMUSCULAR | Status: AC
  Administered 2012-12-25: 4 mg via INTRAVENOUS
  Filled 2012-12-25: qty 2

## 2012-12-25 MED ORDER — SODIUM CHLORIDE 0.9 % IV BOLUS (SEPSIS): 500.0000 mL | Freq: Once | INTRAVENOUS | Status: DC

## 2012-12-25 MED ORDER — SODIUM CHLORIDE 0.9 % IV SOLN
INTRAVENOUS | Status: DC
  Administered 2012-12-25 – 2012-12-31 (×5): via INTRAVENOUS

## 2012-12-25 MED ORDER — PRAMIPEXOLE DIHYDROCHLORIDE 0.25 MG PO TABS
0.2500 mg | ORAL_TABLET | Freq: Every day | ORAL | Status: DC
  Administered 2012-12-25 – 2013-01-02 (×9): 0.25 mg via ORAL
  Filled 2012-12-25 (×12): qty 1

## 2012-12-25 MED ORDER — MORPHINE SULFATE 4 MG/ML IJ SOLN
4.0000 mg | INTRAMUSCULAR | Status: DC | PRN
  Administered 2012-12-27: 1 mg via INTRAVENOUS
  Administered 2012-12-27: 4 mg via INTRAVENOUS
  Administered 2012-12-28: 1 mg via INTRAVENOUS
  Administered 2012-12-28 – 2012-12-29 (×3): 4 mg via INTRAVENOUS
  Filled 2012-12-25 (×7): qty 1

## 2012-12-25 MED ORDER — ALPRAZOLAM 0.25 MG PO TABS
0.2500 mg | ORAL_TABLET | Freq: Four times a day (QID) | ORAL | Status: DC | PRN
  Administered 2012-12-27 – 2012-12-31 (×5): 0.25 mg via ORAL
  Filled 2012-12-25 (×5): qty 1

## 2012-12-25 MED ORDER — ONDANSETRON HCL 4 MG PO TABS
4.0000 mg | ORAL_TABLET | Freq: Four times a day (QID) | ORAL | Status: DC | PRN
  Filled 2012-12-25: qty 1

## 2012-12-25 MED ORDER — ONDANSETRON HCL 4 MG/2ML IJ SOLN
4.0000 mg | Freq: Four times a day (QID) | INTRAMUSCULAR | Status: DC | PRN
  Administered 2012-12-27: 4 mg via INTRAVENOUS
  Filled 2012-12-25: qty 2

## 2012-12-25 MED ORDER — HYDROCODONE-ACETAMINOPHEN 10-325 MG PO TABS
1.0000 | ORAL_TABLET | ORAL | Status: DC | PRN
  Administered 2012-12-25 – 2012-12-29 (×11): 1 via ORAL
  Filled 2012-12-25 (×11): qty 1

## 2012-12-25 MED ORDER — LORAZEPAM 2 MG/ML IJ SOLN
1.0000 mg | Freq: Once | INTRAMUSCULAR | Status: AC
  Administered 2012-12-25: 1 mg via INTRAVENOUS
  Filled 2012-12-25: qty 1

## 2012-12-25 MED ORDER — ASPIRIN EC 81 MG PO TBEC
81.0000 mg | DELAYED_RELEASE_TABLET | Freq: Every day | ORAL | Status: DC
  Administered 2012-12-26 – 2012-12-27 (×2): 81 mg via ORAL
  Filled 2012-12-25 (×2): qty 1

## 2012-12-25 MED ORDER — MORPHINE SULFATE 4 MG/ML IJ SOLN
4.0000 mg | Freq: Once | INTRAMUSCULAR | Status: AC
  Administered 2012-12-25: 4 mg via INTRAVENOUS
  Filled 2012-12-25: qty 1

## 2012-12-25 MED ORDER — SENNOSIDES-DOCUSATE SODIUM 8.6-50 MG PO TABS
1.0000 | ORAL_TABLET | Freq: Every evening | ORAL | Status: DC | PRN
  Filled 2012-12-25: qty 1

## 2012-12-25 NOTE — ED Provider Notes (Signed)
History     CSN: [id number]  Arrival date & time 12/25/12  1145   First MD Initiated Contact with Patient 12/25/12 1153      No chief complaint on file.   (Consider location/radiation/quality/duration/timing/severity/associated sxs/prior treatment) HPI.... level V caveat for urgent need for intervention.   Patient apparently fell out of bed on Thursday morning.   EMS was notified, but patient chose not to come to the emergency department. Today she complains of left hip pain and left shoulder pain.  No loss of consciousness or obvious neuro deficits.  Patient normally walks with a walker.  Past Medical History  Diagnosis Date  . CHF (congestive heart failure)   . Coronary artery disease   . Hypertension   . Arthritis   . Edema of extremities     Past Surgical History  Procedure Laterality Date  . Joint replacement Bilateral     knee replacements  . Back surgery    . Cholecystectomy      No family history on file.  History  Substance Use Topics  . Smoking status: Never Smoker   . Smokeless tobacco: Not on file  . Alcohol Use: Yes     Comment: occasionally    OB History   Grav Para Term Preterm Abortions TAB SAB Ect Mult Living                  Review of Systems  Unable to perform ROS: Acuity of condition    Allergies  Penicillins  Home Medications   Current Outpatient Rx  Name  Route  Sig  Dispense  Refill  . ALPRAZolam (XANAX) 0.25 MG tablet   Oral   Take 0.25 mg by mouth every 6 (six) hours as needed for sleep.         Marland Kitchen aspirin EC 81 MG tablet   Oral   Take 81 mg by mouth daily.         . furosemide (LASIX) 20 MG tablet   Oral   Take 20 mg by mouth 2 (two) times daily.         Marland Kitchen HYDROcodone-acetaminophen (NORCO) 10-325 MG per tablet   Oral   Take 1 tablet by mouth every 6 (six) hours as needed for pain.         . metoprolol (LOPRESSOR) 50 MG tablet   Oral   Take 50 mg by mouth 2 (two) times daily.         . Multiple Vitamin  (MULTIVITAMIN WITH MINERALS) TABS   Oral   Take 1 tablet by mouth daily.         . pramipexole (MIRAPEX) 0.125 MG tablet   Oral   Take 0.25 mg by mouth at bedtime.         . pravastatin (PRAVACHOL) 80 MG tablet   Oral   Take 80 mg by mouth at bedtime.         . quinapril-hydrochlorothiazide (ACCURETIC) 20-12.5 MG per tablet   Oral   Take 1 tablet by mouth daily.         Marland Kitchen spironolactone (ALDACTONE) 25 MG tablet   Oral   Take 25 mg by mouth daily.           BP 134/61  Pulse 62  Resp 30  SpO2 99%  Physical Exam  Nursing note and vitals reviewed. Constitutional: She is oriented to person, place, and time. She appears well-developed and well-nourished.  HENT:  Head: Normocephalic and atraumatic.  Eyes: Conjunctivae  and EOM are normal. Pupils are equal, round, and reactive to light.  Neck:  Tender throughout cervical spine  Cardiovascular: Normal rate, regular rhythm and normal heart sounds.   Pulmonary/Chest: Effort normal and breath sounds normal.  Abdominal: Soft. Bowel sounds are normal.  Musculoskeletal:  Tender left lateral hip  Neurological: She is alert and oriented to person, place, and time.  Does not move left arm with full range of motion  Skin: Skin is warm and dry.  Psychiatric: She has a normal mood and affect.    ED Course  Procedures (including critical care time)  Labs Reviewed  CBC WITH DIFFERENTIAL - Abnormal; Notable for the following:    RBC 3.32 (*)    Hemoglobin 10.4 (*)    HCT 30.2 (*)    All other components within normal limits  BASIC METABOLIC PANEL - Abnormal; Notable for the following:    Sodium 130 (*)    Chloride 94 (*)    BUN 50 (*)    Creatinine, Ser 1.56 (*)    GFR calc non Af Amer 30 (*)    GFR calc Af Amer 35 (*)    All other components within normal limits  URINALYSIS, ROUTINE W REFLEX MICROSCOPIC - Abnormal; Notable for the following:    Leukocytes, UA SMALL (*)    All other components within normal limits   TROPONIN I  URINE MICROSCOPIC-ADD ON  CK   Dg Hip Complete Left  12/25/2012  *RADIOLOGY REPORT*  Clinical Data: Fall and left hip pain.  LEFT HIP - COMPLETE 2+ VIEW  Comparison: Lumbar spine 03/07/2008  Findings: AP view of the pelvis and two views of the left hip were obtained.  The pelvic bony ring is intact.  There is mild joint space narrowing in both hips. There appears to osteophytes near the left femoral head and neck junction.  Difficult to exclude a subtle injury or fracture at this location.  Otherwise, there is no fracture or dislocation.  Symmetric appearance of the sacroiliac joints.  IMPRESSION: No definite fracture or dislocation involving pelvis or left hip. There is mild irregularity at the junction of the left femoral head and neck which could be related to osteophytes.  If there is high clinical concern for an acute fracture, recommend further evaluation with CT or MRI.   Original Report Authenticated By: Richarda Overlie, M.D.    Ct Head Wo Contrast  12/25/2012  *RADIOLOGY REPORT*  Clinical Data:  Fall, head injury  CT HEAD WITHOUT CONTRAST CT MAXILLOFACIAL WITHOUT CONTRAST CT CERVICAL SPINE WITHOUT CONTRAST  Technique:  Multidetector CT imaging of the head, cervical spine, and maxillofacial structures were performed using the standard protocol without intravenous contrast. Multiplanar CT image reconstructions of the cervical spine and maxillofacial structures were also generated.  Comparison:  None.  CT HEAD  Findings: No evidence of parenchymal hemorrhage or extra-axial fluid collection. No mass lesion, mass effect, or midline shift.  No CT evidence of acute infarction.  Subcortical white matter and periventricular small vessel ischemic changes.  Intracranial atherosclerosis.  Global cortical atrophy.  No ventriculomegaly.  Mild mucosal thickening in the bilateral ethmoid sinuses. Mastoid surgery bilaterally.  No evidence of calvarial fracture.  IMPRESSION: No evidence of acute intracranial  abnormality.  Atrophy with small vessel ischemic changes and intracranial atherosclerosis.  CT MAXILLOFACIAL  Findings:  No evidence of maxillofacial fracture.  Mild mucosal thickening in the bilateral ethmoid sinuses. Chronic changes involving the left maxillary sinus.  Prior bilateral mastoid surgery.  The bilateral  orbits, including the globes and retroconal soft tissues, are within normal limits.  IMPRESSION: No evidence of maxillofacial fracture.  CT CERVICAL SPINE  Findings:   Exaggerated cervical lordosis.  Type 2 dens fracture (series 900/image 25).  Anterior inferior corner fracture at C5 (series 900/image 26), displaced.  Moderate multilevel degenerative changes.  No prevertebral soft tissue swelling.  Visualized thyroid is heterogeneous/nodular.  Vascular calcifications.  Visualized lung apices are clear.  IMPRESSION: Type 2 dens fracture.  Displaced anterior inferior corner fracture at C5.  Critical Value/emergent results were called by telephone at the time of interpretation on 12/25/2012 at 1425 hours to Dr. Donnetta Hutching, who verbally acknowledged these results.   Original Report Authenticated By: Charline Bills, M.D.    Ct Cervical Spine Wo Contrast  12/25/2012  *RADIOLOGY REPORT*  Clinical Data:  Fall, head injury  CT HEAD WITHOUT CONTRAST CT MAXILLOFACIAL WITHOUT CONTRAST CT CERVICAL SPINE WITHOUT CONTRAST  Technique:  Multidetector CT imaging of the head, cervical spine, and maxillofacial structures were performed using the standard protocol without intravenous contrast. Multiplanar CT image reconstructions of the cervical spine and maxillofacial structures were also generated.  Comparison:  None.  CT HEAD  Findings: No evidence of parenchymal hemorrhage or extra-axial fluid collection. No mass lesion, mass effect, or midline shift.  No CT evidence of acute infarction.  Subcortical white matter and periventricular small vessel ischemic changes.  Intracranial atherosclerosis.  Global cortical  atrophy.  No ventriculomegaly.  Mild mucosal thickening in the bilateral ethmoid sinuses. Mastoid surgery bilaterally.  No evidence of calvarial fracture.  IMPRESSION: No evidence of acute intracranial abnormality.  Atrophy with small vessel ischemic changes and intracranial atherosclerosis.  CT MAXILLOFACIAL  Findings:  No evidence of maxillofacial fracture.  Mild mucosal thickening in the bilateral ethmoid sinuses. Chronic changes involving the left maxillary sinus.  Prior bilateral mastoid surgery.  The bilateral orbits, including the globes and retroconal soft tissues, are within normal limits.  IMPRESSION: No evidence of maxillofacial fracture.  CT CERVICAL SPINE  Findings:   Exaggerated cervical lordosis.  Type 2 dens fracture (series 900/image 25).  Anterior inferior corner fracture at C5 (series 900/image 26), displaced.  Moderate multilevel degenerative changes.  No prevertebral soft tissue swelling.  Visualized thyroid is heterogeneous/nodular.  Vascular calcifications.  Visualized lung apices are clear.  IMPRESSION: Type 2 dens fracture.  Displaced anterior inferior corner fracture at C5.  Critical Value/emergent results were called by telephone at the time of interpretation on 12/25/2012 at 1425 hours to Dr. Donnetta Hutching, who verbally acknowledged these results.   Original Report Authenticated By: Charline Bills, M.D.    Dg Shoulder Left  12/25/2012  *RADIOLOGY REPORT*  Clinical Data: Fall.  LEFT SHOULDER - 2+ VIEW  Comparison: Chest radiograph 03/19/2008  Findings: Three views of the left shoulder are negative for a fracture or dislocation.  The ribs appear to be intact.  No definite pneumothorax. Degenerative changes in the lower cervical spine.  IMPRESSION: No acute bony abnormality in the left shoulder.   Original Report Authenticated By: Richarda Overlie, M.D.    Dg Hand Complete Left  12/25/2012  *RADIOLOGY REPORT*  Clinical Data: Larey Seat and injured left hand.  LEFT HAND - COMPLETE 3+ VIEW   Comparison: None.  Findings: Severe generalized osseous demineralization.  Joint space narrowing with associated hypertrophic changes involving all of the joints of the wrist, with associated soft tissue swelling.  Joint space narrowing involving the IP joints of all of the fingers.  No evidence of an acute fracture  or dislocation.  IMPRESSION: No acute osseous abnormality.  Severe osseous demineralization. Severe osteoarthritis involving the wrist.  Moderate osteoarthritis involving the IP joints of the hand.   Original Report Authenticated By: Hulan Saas, M.D.    Ct Maxillofacial Wo Cm  12/25/2012  *RADIOLOGY REPORT*  Clinical Data:  Fall, head injury  CT HEAD WITHOUT CONTRAST CT MAXILLOFACIAL WITHOUT CONTRAST CT CERVICAL SPINE WITHOUT CONTRAST  Technique:  Multidetector CT imaging of the head, cervical spine, and maxillofacial structures were performed using the standard protocol without intravenous contrast. Multiplanar CT image reconstructions of the cervical spine and maxillofacial structures were also generated.  Comparison:  None.  CT HEAD  Findings: No evidence of parenchymal hemorrhage or extra-axial fluid collection. No mass lesion, mass effect, or midline shift.  No CT evidence of acute infarction.  Subcortical white matter and periventricular small vessel ischemic changes.  Intracranial atherosclerosis.  Global cortical atrophy.  No ventriculomegaly.  Mild mucosal thickening in the bilateral ethmoid sinuses. Mastoid surgery bilaterally.  No evidence of calvarial fracture.  IMPRESSION: No evidence of acute intracranial abnormality.  Atrophy with small vessel ischemic changes and intracranial atherosclerosis.  CT MAXILLOFACIAL  Findings:  No evidence of maxillofacial fracture.  Mild mucosal thickening in the bilateral ethmoid sinuses. Chronic changes involving the left maxillary sinus.  Prior bilateral mastoid surgery.  The bilateral orbits, including the globes and retroconal soft tissues, are  within normal limits.  IMPRESSION: No evidence of maxillofacial fracture.  CT CERVICAL SPINE  Findings:   Exaggerated cervical lordosis.  Type 2 dens fracture (series 900/image 25).  Anterior inferior corner fracture at C5 (series 900/image 26), displaced.  Moderate multilevel degenerative changes.  No prevertebral soft tissue swelling.  Visualized thyroid is heterogeneous/nodular.  Vascular calcifications.  Visualized lung apices are clear.  IMPRESSION: Type 2 dens fracture.  Displaced anterior inferior corner fracture at C5.  Critical Value/emergent results were called by telephone at the time of interpretation on 12/25/2012 at 1425 hours to Dr. Donnetta Hutching, who verbally acknowledged these results.   Original Report Authenticated By: Charline Bills, M.D.      1. CHF (congestive heart failure)   2. Coronary artery disease   3. Hypertension   4. Arthritis   5. Fall, initial encounter   6. Cervical spine fracture, initial encounter   7. Contusion of left hip, initial encounter    CRITICAL CARE Performed by: Donnetta Hutching   Total critical care time: 45  Critical care time was exclusive of separately billable procedures and treating other patients.  Critical care was necessary to treat or prevent imminent or life-threatening deterioration.  Critical care was time spent personally by me on the following activities: development of treatment plan with patient and/or surrogate as well as nursing, discussions with consultants, evaluation of patient's response to treatment, examination of patient, obtaining history from patient or surrogate, ordering and performing treatments and interventions, ordering and review of laboratory studies, ordering and review of radiographic studies, pulse oximetry and re-evaluation of patient's condition.   MDM  CT scan of cervical spine shows type II dens fracture along with a  C5 fracture.  This was discussed with Dr. Danielle Dess, neurosurgery.   He does not think this is  an operative fracture at this time. Will admit to general medicine Dr Lavera Guise.  Cervical collar placed in emergency department for spinal immobilization        Donnetta Hutching, MD 12/27/12 1750

## 2012-12-25 NOTE — Progress Notes (Signed)
Orthopedic Tech Progress Note Patient Details:  [name] 1931-03-10 [id number] Philly collar that was put on patient by nursing staff removed. Soft cervical collar applied. Application tolerated well.  Ortho Devices Type of Ortho Device: Soft collar Ortho Device/Splint Interventions: Application   Asia R Thompson 12/25/2012, 5:02 PM

## 2012-12-25 NOTE — ED Notes (Signed)
PT from home via EMS. Pt woke up on floor Thurs am and per EMS, nose looks fx.  EMS to scene and pt refused to go.  Pt called EMS today d/t L hip pain that has continued since Thursday.  Usually uses a walker, but is unable even to bear weight on L foot.  Per EMS, no rotation or  shortness.
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
## 2012-12-25 NOTE — ED Notes (Signed)
Pt transported to MRI 

## 2012-12-25 NOTE — ED Notes (Signed)
Pt states she feels less anxious after the ativan.

## 2012-12-25 NOTE — Consult Note (Signed)
Reason for Consult:fracture of C2 and C5 Referring Physician: Donnetta Hutching  [facility] [name] is an 79 y.o. female.  HPI: patient is Toni 79 year old individual who has been having increasing Toni number of falls lately. Her daughter describes that her blood pressure medications have been changed an effort to better control her blood pressure is she's had some falls on Toni number of occasions. This past Thursday she had fallen from her bed possibly striking her head. Apparently she refused to be taken by EMS to the emergency room. She returned Toni call on Friday to EMS and was taken to the hospital and has been here for further evaluation including Toni CT scan of the cervical spine. This reveals the presence of Toni C2 type II odontoid fracture with minimal displacement. There is also evidence of an anterior ventral osteophyte fracture from C5 hyperlordosis noted at the C5-C6 segment. There is severe spondylitic stenosis at C4-C5. There is hyperlordosis of the entire cervical spine at this time.  The patient and her daughter note that her legs have been weak over the past days to weeks she has Toni history of spinal stenosis believed to be in the lumbar spine her daughter however notes that her left side particularly her left arm is much weaker in the past couple of days and it had been previously.  Past Medical History  Diagnosis Date  . CHF (congestive heart failure)   . Coronary artery disease   . Hypertension   . Arthritis     Past Surgical History  Procedure Laterality Date  . Joint replacement Bilateral     knee replacements  . Back surgery      No family history on file.  Social History:  reports that she has never smoked. She does not have any smokeless tobacco history on file. She reports that  drinks alcohol. She reports that she does not use illicit drugs.  Allergies:  Allergies  Allergen Reactions  . Penicillins     Medications: I have not reviewed the patient's medications  Results for  orders placed during the hospital encounter of 12/25/12 (from the past 48 hour(s))  CBC WITH DIFFERENTIAL     Status: Abnormal   Collection Time    12/25/12 12:21 PM      Result Value Range   WBC 7.5  4.0 - 10.5 K/uL   RBC 3.32 (*) 3.87 - 5.11 MIL/uL   Hemoglobin 10.4 (*) 12.0 - 15.0 g/dL   HCT 65.7 (*) 84.6 - 96.2 %   MCV 91.0  78.0 - 100.0 fL   MCH 31.3  26.0 - 34.0 pg   MCHC 34.4  30.0 - 36.0 g/dL   RDW 95.2  84.1 - 32.4 %   Platelets 208  150 - 400 K/uL   Neutrophils Relative 70  43 - 77 %   Neutro Abs 5.2  1.7 - 7.7 K/uL   Lymphocytes Relative 19  12 - 46 %   Lymphs Abs 1.4  0.7 - 4.0 K/uL   Monocytes Relative 7  3 - 12 %   Monocytes Absolute 0.5  0.1 - 1.0 K/uL   Eosinophils Relative 4  0 - 5 %   Eosinophils Absolute 0.3  0.0 - 0.7 K/uL   Basophils Relative 1  0 - 1 %   Basophils Absolute 0.0  0.0 - 0.1 K/uL  BASIC METABOLIC PANEL     Status: Abnormal   Collection Time    12/25/12 12:21 PM  Result Value Range   Sodium 130 (*) 135 - 145 mEq/L   Potassium 4.3  3.5 - 5.1 mEq/L   Chloride 94 (*) 96 - 112 mEq/L   CO2 27  19 - 32 mEq/L   Glucose, Bld 93  70 - 99 mg/dL   BUN 50 (*) 6 - 23 mg/dL   Creatinine, Ser 1.61 (*) 0.50 - 1.10 mg/dL   Calcium 9.5  8.4 - 09.6 mg/dL   GFR calc non Af Amer 30 (*) >90 mL/min   GFR calc Af Amer 35 (*) >90 mL/min   Comment:            The eGFR has been calculated     using the CKD EPI equation.     This calculation has not been     validated in all clinical     situations.     eGFR's persistently     <90 mL/min signify     possible Chronic Kidney Disease.  URINALYSIS, ROUTINE W REFLEX MICROSCOPIC     Status: Abnormal   Collection Time    12/25/12  3:21 PM      Result Value Range   Color, Urine YELLOW  YELLOW   APPearance CLEAR  CLEAR   Specific Gravity, Urine 1.011  1.005 - 1.030   pH 6.0  5.0 - 8.0   Glucose, UA NEGATIVE  NEGATIVE mg/dL   Hgb urine dipstick NEGATIVE  NEGATIVE   Bilirubin Urine NEGATIVE  NEGATIVE    Ketones, ur NEGATIVE  NEGATIVE mg/dL   Protein, ur NEGATIVE  NEGATIVE mg/dL   Urobilinogen, UA 0.2  0.0 - 1.0 mg/dL   Nitrite NEGATIVE  NEGATIVE   Leukocytes, UA SMALL (*) NEGATIVE  URINE MICROSCOPIC-ADD ON     Status: None   Collection Time    12/25/12  3:21 PM      Result Value Range   Squamous Epithelial / LPF RARE  RARE   WBC, UA 3-6  <3 WBC/hpf  TROPONIN I     Status: None   Collection Time    12/25/12  3:24 PM      Result Value Range   Troponin I <0.30  <0.30 ng/mL   Comment:            Due to the release kinetics of cTnI,     Toni negative result within the first hours     of the onset of symptoms does not rule out     myocardial infarction with certainty.     If myocardial infarction is still suspected,     repeat the test at appropriate intervals.    Dg Hip Complete Left  12/25/2012  *RADIOLOGY REPORT*  Clinical Data: Fall and left hip pain.  LEFT HIP - COMPLETE 2+ VIEW  Comparison: Lumbar spine 03/07/2008  Findings: AP view of the pelvis and two views of the left hip were obtained.  The pelvic bony ring is intact.  There is mild joint space narrowing in both hips. There appears to osteophytes near the left femoral head and neck junction.  Difficult to exclude Toni subtle injury or fracture at this location.  Otherwise, there is no fracture or dislocation.  Symmetric appearance of the sacroiliac joints.  IMPRESSION: No definite fracture or dislocation involving pelvis or left hip. There is mild irregularity at the junction of the left femoral head and neck which could be related to osteophytes.  If there is high clinical concern for an acute fracture, recommend further evaluation with  CT or MRI.   Original Report Authenticated By: Richarda Overlie, M.D.    Ct Head Wo Contrast  12/25/2012  *RADIOLOGY REPORT*  Clinical Data:  Fall, head injury  CT HEAD WITHOUT CONTRAST CT MAXILLOFACIAL WITHOUT CONTRAST CT CERVICAL SPINE WITHOUT CONTRAST  Technique:  Multidetector CT imaging of the head,  cervical spine, and maxillofacial structures were performed using the standard protocol without intravenous contrast. Multiplanar CT image reconstructions of the cervical spine and maxillofacial structures were also generated.  Comparison:  None.  CT HEAD  Findings: No evidence of parenchymal hemorrhage or extra-axial fluid collection. No mass lesion, mass effect, or midline shift.  No CT evidence of acute infarction.  Subcortical white matter and periventricular small vessel ischemic changes.  Intracranial atherosclerosis.  Global cortical atrophy.  No ventriculomegaly.  Mild mucosal thickening in the bilateral ethmoid sinuses. Mastoid surgery bilaterally.  No evidence of calvarial fracture.  IMPRESSION: No evidence of acute intracranial abnormality.  Atrophy with small vessel ischemic changes and intracranial atherosclerosis.  CT MAXILLOFACIAL  Findings:  No evidence of maxillofacial fracture.  Mild mucosal thickening in the bilateral ethmoid sinuses. Chronic changes involving the left maxillary sinus.  Prior bilateral mastoid surgery.  The bilateral orbits, including the globes and retroconal soft tissues, are within normal limits.  IMPRESSION: No evidence of maxillofacial fracture.  CT CERVICAL SPINE  Findings:   Exaggerated cervical lordosis.  Type 2 dens fracture (series 900/image 25).  Anterior inferior corner fracture at C5 (series 900/image 26), displaced.  Moderate multilevel degenerative changes.  No prevertebral soft tissue swelling.  Visualized thyroid is heterogeneous/nodular.  Vascular calcifications.  Visualized lung apices are clear.  IMPRESSION: Type 2 dens fracture.  Displaced anterior inferior corner fracture at C5.  Critical Value/emergent results were called by telephone at the time of interpretation on 12/25/2012 at 1425 hours to Dr. Donnetta Hutching, who verbally acknowledged these results.   Original Report Authenticated By: Charline Bills, M.D.    Ct Cervical Spine Wo Contrast  12/25/2012   *RADIOLOGY REPORT*  Clinical Data:  Fall, head injury  CT HEAD WITHOUT CONTRAST CT MAXILLOFACIAL WITHOUT CONTRAST CT CERVICAL SPINE WITHOUT CONTRAST  Technique:  Multidetector CT imaging of the head, cervical spine, and maxillofacial structures were performed using the standard protocol without intravenous contrast. Multiplanar CT image reconstructions of the cervical spine and maxillofacial structures were also generated.  Comparison:  None.  CT HEAD  Findings: No evidence of parenchymal hemorrhage or extra-axial fluid collection. No mass lesion, mass effect, or midline shift.  No CT evidence of acute infarction.  Subcortical white matter and periventricular small vessel ischemic changes.  Intracranial atherosclerosis.  Global cortical atrophy.  No ventriculomegaly.  Mild mucosal thickening in the bilateral ethmoid sinuses. Mastoid surgery bilaterally.  No evidence of calvarial fracture.  IMPRESSION: No evidence of acute intracranial abnormality.  Atrophy with small vessel ischemic changes and intracranial atherosclerosis.  CT MAXILLOFACIAL  Findings:  No evidence of maxillofacial fracture.  Mild mucosal thickening in the bilateral ethmoid sinuses. Chronic changes involving the left maxillary sinus.  Prior bilateral mastoid surgery.  The bilateral orbits, including the globes and retroconal soft tissues, are within normal limits.  IMPRESSION: No evidence of maxillofacial fracture.  CT CERVICAL SPINE  Findings:   Exaggerated cervical lordosis.  Type 2 dens fracture (series 900/image 25).  Anterior inferior corner fracture at C5 (series 900/image 26), displaced.  Moderate multilevel degenerative changes.  No prevertebral soft tissue swelling.  Visualized thyroid is heterogeneous/nodular.  Vascular calcifications.  Visualized lung apices are  clear.  IMPRESSION: Type 2 dens fracture.  Displaced anterior inferior corner fracture at C5.  Critical Value/emergent results were called by telephone at the time of  interpretation on 12/25/2012 at 1425 hours to Dr. Donnetta Hutching, who verbally acknowledged these results.   Original Report Authenticated By: Charline Bills, M.D.    Dg Shoulder Left  12/25/2012  *RADIOLOGY REPORT*  Clinical Data: Fall.  LEFT SHOULDER - 2+ VIEW  Comparison: Chest radiograph 03/19/2008  Findings: Three views of the left shoulder are negative for Toni fracture or dislocation.  The ribs appear to be intact.  No definite pneumothorax. Degenerative changes in the lower cervical spine.  IMPRESSION: No acute bony abnormality in the left shoulder.   Original Report Authenticated By: Richarda Overlie, M.D.    Dg Hand Complete Left  12/25/2012  *RADIOLOGY REPORT*  Clinical Data: Larey Seat and injured left hand.  LEFT HAND - COMPLETE 3+ VIEW  Comparison: None.  Findings: Severe generalized osseous demineralization.  Joint space narrowing with associated hypertrophic changes involving all of the joints of the wrist, with associated soft tissue swelling.  Joint space narrowing involving the IP joints of all of the fingers.  No evidence of an acute fracture or dislocation.  IMPRESSION: No acute osseous abnormality.  Severe osseous demineralization. Severe osteoarthritis involving the wrist.  Moderate osteoarthritis involving the IP joints of the hand.   Original Report Authenticated By: Hulan Saas, M.D.    Ct Maxillofacial Wo Cm  12/25/2012  *RADIOLOGY REPORT*  Clinical Data:  Fall, head injury  CT HEAD WITHOUT CONTRAST CT MAXILLOFACIAL WITHOUT CONTRAST CT CERVICAL SPINE WITHOUT CONTRAST  Technique:  Multidetector CT imaging of the head, cervical spine, and maxillofacial structures were performed using the standard protocol without intravenous contrast. Multiplanar CT image reconstructions of the cervical spine and maxillofacial structures were also generated.  Comparison:  None.  CT HEAD  Findings: No evidence of parenchymal hemorrhage or extra-axial fluid collection. No mass lesion, mass effect, or midline shift.   No CT evidence of acute infarction.  Subcortical white matter and periventricular small vessel ischemic changes.  Intracranial atherosclerosis.  Global cortical atrophy.  No ventriculomegaly.  Mild mucosal thickening in the bilateral ethmoid sinuses. Mastoid surgery bilaterally.  No evidence of calvarial fracture.  IMPRESSION: No evidence of acute intracranial abnormality.  Atrophy with small vessel ischemic changes and intracranial atherosclerosis.  CT MAXILLOFACIAL  Findings:  No evidence of maxillofacial fracture.  Mild mucosal thickening in the bilateral ethmoid sinuses. Chronic changes involving the left maxillary sinus.  Prior bilateral mastoid surgery.  The bilateral orbits, including the globes and retroconal soft tissues, are within normal limits.  IMPRESSION: No evidence of maxillofacial fracture.  CT CERVICAL SPINE  Findings:   Exaggerated cervical lordosis.  Type 2 dens fracture (series 900/image 25).  Anterior inferior corner fracture at C5 (series 900/image 26), displaced.  Moderate multilevel degenerative changes.  No prevertebral soft tissue swelling.  Visualized thyroid is heterogeneous/nodular.  Vascular calcifications.  Visualized lung apices are clear.  IMPRESSION: Type 2 dens fracture.  Displaced anterior inferior corner fracture at C5.  Critical Value/emergent results were called by telephone at the time of interpretation on 12/25/2012 at 1425 hours to Dr. Donnetta Hutching, who verbally acknowledged these results.   Original Report Authenticated By: Charline Bills, M.D.     Review of Systems  Constitutional: Positive for malaise/fatigue.  HENT: Negative.   Eyes: Negative.   Respiratory: Negative.   Cardiovascular: Negative.   Gastrointestinal: Negative.   Genitourinary: Negative.   Musculoskeletal:  Positive for falls.  Skin: Negative.   Neurological: Positive for tingling, focal weakness and weakness.  Endo/Heme/Allergies: Negative.   Psychiatric/Behavioral: Negative.    Blood  pressure 132/72, pulse 58, resp. rate 16, SpO2 98.00%. Physical Exam  Constitutional: She is oriented to person, place, and time.  Frail-appearing elderly lady with ecchymoses about her right eye  HENT:  Contusion and ecchymosis about right I write for head and scalp in the malar region  Eyes: Conjunctivae and EOM are normal.  Neck:  In  cervical collar at this time  Musculoskeletal:  Weak in upper extremities with deltoids demonstrating 4 minus out of 5 strength biceps 4/5 strength triceps 4/5 strength on the right and 4 minus out of 5 strength on the left. Intrinsic function weak on the left at 3/5 4/5 on the right finger extensors are weak on the left at 3/5 4/5 on the right. Lower extremity strength weak in the proximal m [place] with the iliopsoas not having antigravity strength. Gastrocs appear 4/5 bilaterally tibialis anterior appears 3/5 bilaterally quadriceps are 4/5 bilaterally. Increased tone in both lower extremities.  Neurological: She is alert and oriented to person, place, and time.  Deep tendon reflexes are absent. Cranial nerve examination is intact. Babinski is equivocal in both lower extremities.  Psychiatric:  Anxious appearing elderly lady who describes Toni severe claustrophobia and is reluctant about MRI.    Assessment/Plan: C2 and C5 fracture with possible spinal cord injury.  The patient needs an emergent MRI of the cervical spine and I'll order this at this time. I have ordered 1 mg of Ativan IV for sedation secondary to patient claustrophobia.  Lantz Hermann J 12/25/2012, 5:21 PM

## 2012-12-25 NOTE — ED Notes (Signed)
SpO2 decreased to 88% on RA after ativan administration. Pt placed on 2L [place] for MRI to maintain SpO2 and reminded to breathe through nose.

## 2012-12-25 NOTE — ED Notes (Signed)
Pt appears anxious, RR rate increased to 24 bpm. Pt states she feels like she cant breathe. Pt suctioned to eliminate "flem" she felt was in her throat. SpO2 level maintained above 95%.

## 2012-12-25 NOTE — ED Notes (Signed)
MD at bedside. Pt fell out of bed in the am Thursday morning. Pt was unable to get up until EMS came. Pt does not remember falling.

## 2012-12-25 NOTE — H&P (Signed)
[facility] History and Physical  [name] ZOX:[id number] DOB: 04/18/1931 DOA: 12/25/2012  Referring physician: ED PCP: [name], MD  Specialists: [facility] - neurosurgery   Chief Complaint: neck pain, fall   HPI: [name] is a 79 y.o. female with history of HTN, presumed diastolic chf, severe spinal stenosis, osteoporosis, who has suffered a fall on Thursday morning at 2 AM - she fell face down and landed on her abdomen, crawled to the life alert system and pushed the button. EMS arrived but she refused to come to the hospital. She started developing left hip pain, and family also noted some left hand weakness and left leg weakness. She finally agreed to come to the hospital tonight and was found to have  A C2 and C 5 spine fracture. She was seen by Dr. Danielle Dess from neurosurgery and he did no recommend surgical management. Patient will be placed on C collar and then we will admit.    Review of Systems: Patient has balance problems and ambulates with a walker , has chronic back pain, bruises from fall  The daughter gives history and denies patient having anorexia, fever, weight loss,vision loss, decreased hearing, hoarseness, chest pain, syncope, dyspnea on exertion, hemoptysis, abdominal pain, melena, hematochezia, severe indigestion/heartburn, hematuria, incontinence, genital sores,  transient blindness, difficulty walking, depression, unusual weight change, abnormal bleeding, enlarged lymph nodes, angioedema, and breast masses.    Past Medical History  Diagnosis Date  . CHF (congestive heart failure)   . Coronary artery disease   . Hypertension   . Arthritis   . Edema of extremities    Past Surgical History  Procedure Laterality Date  . Joint replacement Bilateral     knee replacements  . Back surgery    . Cholecystectomy     Social History:  reports that she has never smoked. She does not have any smokeless tobacco history on file. She  reports that she does not drink alcohol or use illicit drugs. Lives by herself with a lot of family support   Allergies  Allergen Reactions  . Penicillins     Family History  Problem Relation Age of Onset  . Dementia Sister   . CAD Brother     Prior to Admission medications   Medication Sig Start Date End Date Taking? Authorizing Provider  ALPRAZolam (XANAX) 0.25 MG tablet Take 0.25 mg by mouth every 6 (six) hours as needed for sleep.   Yes Historical Provider, MD  aspirin EC 81 MG tablet Take 81 mg by mouth daily.   Yes Historical Provider, MD  furosemide (LASIX) 20 MG tablet Take 20 mg by mouth 2 (two) times daily.   Yes Historical Provider, MD  HYDROcodone-acetaminophen (NORCO) 10-325 MG per tablet Take 1 tablet by mouth every 6 (six) hours as needed for pain.   Yes Historical Provider, MD  metoprolol (LOPRESSOR) 50 MG tablet Take 50 mg by mouth 2 (two) times daily.   Yes Historical Provider, MD  Multiple Vitamin (MULTIVITAMIN WITH MINERALS) TABS Take 1 tablet by mouth daily.   Yes Historical Provider, MD  pramipexole (MIRAPEX) 0.125 MG tablet Take 0.25 mg by mouth at bedtime.   Yes Historical Provider, MD  pravastatin (PRAVACHOL) 80 MG tablet Take 80 mg by mouth at bedtime.   Yes Historical Provider, MD  quinapril-hydrochlorothiazide (ACCURETIC) 20-12.5 MG per tablet Take 1 tablet by mouth daily.   Yes Historical Provider, MD  spironolactone (ALDACTONE) 25 MG tablet Take 25 mg by mouth daily.  Yes Historical Provider, MD   Physical Exam: Filed Vitals:   12/25/12 1645 12/25/12 1700 12/25/12 1715 12/25/12 1730  BP: 132/72 150/57 149/50 134/61  Pulse: 58 61 60 62  Resp: 16 23 16 30   SpO2: 98% 97% 99% 99%     General:  Asleep from sedation received for MRI.   Eyes: Pupil equal and round, there is ecchymosis around the right eye  ENT: Clear pharynx  Neck: Unable to examine as patient has a soft collar  Cardiovascular: Regular rate and rhythm without murmurs rubs or  gallops  Respiratory: Even unlabored, no crackles no wheezes  Abdomen: Soft, nontender, bowel sounds are present  Skin: Excoriations, ecchymosis  Musculoskeletal: Tender over left hip  Psychiatric: Asleep  Neurologic: Weaker on the left according to family  Labs on Admission:  Basic Metabolic Panel:  Recent Labs Lab 12/25/12 1221  NA 130*  K 4.3  CL 94*  CO2 27  GLUCOSE 93  BUN 50*  CREATININE 1.56*  CALCIUM 9.5   Liver Function Tests: No results found for this basename: AST, ALT, ALKPHOS, BILITOT, PROT, ALBUMIN,  in the last 168 hours No results found for this basename: LIPASE, AMYLASE,  in the last 168 hours No results found for this basename: AMMONIA,  in the last 168 hours CBC:  Recent Labs Lab 12/25/12 1221  WBC 7.5  NEUTROABS 5.2  HGB 10.4*  HCT 30.2*  MCV 91.0  PLT 208   Cardiac Enzymes:  Recent Labs Lab 12/25/12 1524  TROPONINI <0.30    BNP (last 3 results) No results found for this basename: PROBNP,  in the last 8760 hours CBG: No results found for this basename: GLUCAP,  in the last 168 hours  Radiological Exams on Admission: Dg Hip Complete Left  12/25/2012  *RADIOLOGY REPORT*  Clinical Data: Fall and left hip pain.  LEFT HIP - COMPLETE 2+ VIEW  Comparison: Lumbar spine 03/07/2008  Findings: AP view of the pelvis and two views of the left hip were obtained.  The pelvic bony ring is intact.  There is mild joint space narrowing in both hips. There appears to osteophytes near the left femoral head and neck junction.  Difficult to exclude a subtle injury or fracture at this location.  Otherwise, there is no fracture or dislocation.  Symmetric appearance of the sacroiliac joints.  IMPRESSION: No definite fracture or dislocation involving pelvis or left hip. There is mild irregularity at the junction of the left femoral head and neck which could be related to osteophytes.  If there is high clinical concern for an acute fracture, recommend further  evaluation with CT or MRI.   Original Report Authenticated By: Richarda Overlie, M.D.    Ct Head Wo Contrast  12/25/2012  *RADIOLOGY REPORT*  Clinical Data:  Fall, head injury  CT HEAD WITHOUT CONTRAST CT MAXILLOFACIAL WITHOUT CONTRAST CT CERVICAL SPINE WITHOUT CONTRAST  Technique:  Multidetector CT imaging of the head, cervical spine, and maxillofacial structures were performed using the standard protocol without intravenous contrast. Multiplanar CT image reconstructions of the cervical spine and maxillofacial structures were also generated.  Comparison:  None.  CT HEAD  Findings: No evidence of parenchymal hemorrhage or extra-axial fluid collection. No mass lesion, mass effect, or midline shift.  No CT evidence of acute infarction.  Subcortical white matter and periventricular small vessel ischemic changes.  Intracranial atherosclerosis.  Global cortical atrophy.  No ventriculomegaly.  Mild mucosal thickening in the bilateral ethmoid sinuses. Mastoid surgery bilaterally.  No evidence of  calvarial fracture.  IMPRESSION: No evidence of acute intracranial abnormality.  Atrophy with small vessel ischemic changes and intracranial atherosclerosis.  CT MAXILLOFACIAL  Findings:  No evidence of maxillofacial fracture.  Mild mucosal thickening in the bilateral ethmoid sinuses. Chronic changes involving the left maxillary sinus.  Prior bilateral mastoid surgery.  The bilateral orbits, including the globes and retroconal soft tissues, are within normal limits.  IMPRESSION: No evidence of maxillofacial fracture.  CT CERVICAL SPINE  Findings:   Exaggerated cervical lordosis.  Type 2 dens fracture (series 900/image 25).  Anterior inferior corner fracture at C5 (series 900/image 26), displaced.  Moderate multilevel degenerative changes.  No prevertebral soft tissue swelling.  Visualized thyroid is heterogeneous/nodular.  Vascular calcifications.  Visualized lung apices are clear.  IMPRESSION: Type 2 dens fracture.  Displaced anterior  inferior corner fracture at C5.  Critical Value/emergent results were called by telephone at the time of interpretation on 12/25/2012 at 1425 hours to Dr. Donnetta Hutching, who verbally acknowledged these results.   Original Report Authenticated By: Charline Bills, M.D.    Ct Cervical Spine Wo Contrast  12/25/2012  *RADIOLOGY REPORT*  Clinical Data:  Fall, head injury  CT HEAD WITHOUT CONTRAST CT MAXILLOFACIAL WITHOUT CONTRAST CT CERVICAL SPINE WITHOUT CONTRAST  Technique:  Multidetector CT imaging of the head, cervical spine, and maxillofacial structures were performed using the standard protocol without intravenous contrast. Multiplanar CT image reconstructions of the cervical spine and maxillofacial structures were also generated.  Comparison:  None.  CT HEAD  Findings: No evidence of parenchymal hemorrhage or extra-axial fluid collection. No mass lesion, mass effect, or midline shift.  No CT evidence of acute infarction.  Subcortical white matter and periventricular small vessel ischemic changes.  Intracranial atherosclerosis.  Global cortical atrophy.  No ventriculomegaly.  Mild mucosal thickening in the bilateral ethmoid sinuses. Mastoid surgery bilaterally.  No evidence of calvarial fracture.  IMPRESSION: No evidence of acute intracranial abnormality.  Atrophy with small vessel ischemic changes and intracranial atherosclerosis.  CT MAXILLOFACIAL  Findings:  No evidence of maxillofacial fracture.  Mild mucosal thickening in the bilateral ethmoid sinuses. Chronic changes involving the left maxillary sinus.  Prior bilateral mastoid surgery.  The bilateral orbits, including the globes and retroconal soft tissues, are within normal limits.  IMPRESSION: No evidence of maxillofacial fracture.  CT CERVICAL SPINE  Findings:   Exaggerated cervical lordosis.  Type 2 dens fracture (series 900/image 25).  Anterior inferior corner fracture at C5 (series 900/image 26), displaced.  Moderate multilevel degenerative changes.   No prevertebral soft tissue swelling.  Visualized thyroid is heterogeneous/nodular.  Vascular calcifications.  Visualized lung apices are clear.  IMPRESSION: Type 2 dens fracture.  Displaced anterior inferior corner fracture at C5.  Critical Value/emergent results were called by telephone at the time of interpretation on 12/25/2012 at 1425 hours to Dr. Donnetta Hutching, who verbally acknowledged these results.   Original Report Authenticated By: Charline Bills, M.D.    Dg Shoulder Left  12/25/2012  *RADIOLOGY REPORT*  Clinical Data: Fall.  LEFT SHOULDER - 2+ VIEW  Comparison: Chest radiograph 03/19/2008  Findings: Three views of the left shoulder are negative for a fracture or dislocation.  The ribs appear to be intact.  No definite pneumothorax. Degenerative changes in the lower cervical spine.  IMPRESSION: No acute bony abnormality in the left shoulder.   Original Report Authenticated By: Richarda Overlie, M.D.    Dg Hand Complete Left  12/25/2012  *RADIOLOGY REPORT*  Clinical Data: Larey Seat and injured left hand.  LEFT  HAND - COMPLETE 3+ VIEW  Comparison: None.  Findings: Severe generalized osseous demineralization.  Joint space narrowing with associated hypertrophic changes involving all of the joints of the wrist, with associated soft tissue swelling.  Joint space narrowing involving the IP joints of all of the fingers.  No evidence of an acute fracture or dislocation.  IMPRESSION: No acute osseous abnormality.  Severe osseous demineralization. Severe osteoarthritis involving the wrist.  Moderate osteoarthritis involving the IP joints of the hand.   Original Report Authenticated By: Hulan Saas, M.D.    Ct Maxillofacial Wo Cm  12/25/2012  *RADIOLOGY REPORT*  Clinical Data:  Fall, head injury  CT HEAD WITHOUT CONTRAST CT MAXILLOFACIAL WITHOUT CONTRAST CT CERVICAL SPINE WITHOUT CONTRAST  Technique:  Multidetector CT imaging of the head, cervical spine, and maxillofacial structures were performed using the standard  protocol without intravenous contrast. Multiplanar CT image reconstructions of the cervical spine and maxillofacial structures were also generated.  Comparison:  None.  CT HEAD  Findings: No evidence of parenchymal hemorrhage or extra-axial fluid collection. No mass lesion, mass effect, or midline shift.  No CT evidence of acute infarction.  Subcortical white matter and periventricular small vessel ischemic changes.  Intracranial atherosclerosis.  Global cortical atrophy.  No ventriculomegaly.  Mild mucosal thickening in the bilateral ethmoid sinuses. Mastoid surgery bilaterally.  No evidence of calvarial fracture.  IMPRESSION: No evidence of acute intracranial abnormality.  Atrophy with small vessel ischemic changes and intracranial atherosclerosis.  CT MAXILLOFACIAL  Findings:  No evidence of maxillofacial fracture.  Mild mucosal thickening in the bilateral ethmoid sinuses. Chronic changes involving the left maxillary sinus.  Prior bilateral mastoid surgery.  The bilateral orbits, including the globes and retroconal soft tissues, are within normal limits.  IMPRESSION: No evidence of maxillofacial fracture.  CT CERVICAL SPINE  Findings:   Exaggerated cervical lordosis.  Type 2 dens fracture (series 900/image 25).  Anterior inferior corner fracture at C5 (series 900/image 26), displaced.  Moderate multilevel degenerative changes.  No prevertebral soft tissue swelling.  Visualized thyroid is heterogeneous/nodular.  Vascular calcifications.  Visualized lung apices are clear.  IMPRESSION: Type 2 dens fracture.  Displaced anterior inferior corner fracture at C5.  Critical Value/emergent results were called by telephone at the time of interpretation on 12/25/2012 at 1425 hours to Dr. Donnetta Hutching, who verbally acknowledged these results.   Original Report Authenticated By: Charline Bills, M.D.       Assessment/Plan Principal Problem:   Cervical spine fracture Active Problems:   CHF (congestive heart failure)    Coronary artery disease   Hypertension   Arthritis   Osteoporosis   Spinal stenosis of lumbar region   Chronic back pain   Hip pain   AKI (acute kidney injury)   Fall   Anemia   Hyponatremia   RLS (restless legs syndrome)   1. Cervical spine fracture-plan for cervical collar, physical therapy outpatient therapy and skilled nursing home placement. Pain control with IV morphine and oral hydrocodone 2. Acute kidney injury-probably from Diuretics, ACE inhibitors-will check also creatinine kinase level aspiration was on the floor for a while. Hydrated overnight, hold Lasix, repeat basic metabolic profile 3. Chronic back pain, osteoporosis, spinal stenosis-continue calcium vitamin D and hydrocodone 4. Restless leg syndrome-continue pramipexole 5. Hypertension and probable diastolic congestive heart failure-continue beta blocker. 6. Hyponatremia-probably from diuretics-recheck tomorrow  Code Status: full code  Family Communication: daughter  Disposition Plan: snf    Time spent: 1 hour  Hafiz Irion [facility] Pager [phone]  If 7PM-7AM,  please contact night-coverage [email or web address] Password [facility] 12/25/2012, 6:36 PM

## 2012-12-25 NOTE — ED Notes (Signed)
Pt transported to radiology.

## 2012-12-26 ENCOUNTER — Inpatient Hospital Stay (HOSPITAL_COMMUNITY): Payer: Medicare Other

## 2012-12-26 DIAGNOSIS — E871 Hypo-osmolality and hyponatremia: Secondary | ICD-10-CM

## 2012-12-26 DIAGNOSIS — M129 Arthropathy, unspecified: Secondary | ICD-10-CM

## 2012-12-26 DIAGNOSIS — M25559 Pain in unspecified hip: Secondary | ICD-10-CM

## 2012-12-26 DIAGNOSIS — IMO0002 Reserved for concepts with insufficient information to code with codable children: Secondary | ICD-10-CM

## 2012-12-26 DIAGNOSIS — I10 Essential (primary) hypertension: Secondary | ICD-10-CM

## 2012-12-26 DIAGNOSIS — M48061 Spinal stenosis, lumbar region without neurogenic claudication: Secondary | ICD-10-CM

## 2012-12-26 LAB — BASIC METABOLIC PANEL
Calcium: 9.6 mg/dL (ref 8.4–10.5)
GFR calc Af Amer: 48 mL/min — ABNORMAL LOW (ref >90)
GFR calc non Af Amer: 41 mL/min — ABNORMAL LOW (ref >90)
Sodium: 137 mEq/L (ref 135–145)

## 2012-12-26 LAB — CBC
Platelets: 226 10*3/uL (ref 150–400)
RBC: 3.5 MIL/uL — ABNORMAL LOW (ref 3.87–5.11)
WBC: 7.9 10*3/uL (ref 4.0–10.5)

## 2012-12-26 MED ORDER — WHITE PETROLATUM GEL
Status: AC
  Administered 2012-12-26: 0.2
  Filled 2012-12-26: qty 5

## 2012-12-26 MED ORDER — PNEUMOCOCCAL VAC POLYVALENT 25 MCG/0.5ML IJ INJ
0.5000 mL | INJECTION | INTRAMUSCULAR | Status: AC
  Filled 2012-12-26: qty 0.5

## 2012-12-26 MED ORDER — DEXAMETHASONE SODIUM PHOSPHATE 4 MG/ML IJ SOLN
4.0000 mg | Freq: Four times a day (QID) | INTRAMUSCULAR | Status: DC
Start: 2012-12-26 — End: 2013-01-01
  Administered 2012-12-26 – 2013-01-01 (×23): 4 mg via INTRAVENOUS
  Filled 2012-12-26 (×14): qty 1
  Filled 2012-12-26: qty 2
  Filled 2012-12-26 (×6): qty 1
  Filled 2012-12-26: qty 2
  Filled 2012-12-26 (×6): qty 1

## 2012-12-26 NOTE — Progress Notes (Signed)
Subjective: Patient reports Offers no complaints states she feels somewhat better  Objective: Vital signs in last 24 hours: Temp:  [97.8 F (36.6 C)-98.8 F (37.1 C)] 98.8 F (37.1 C) (04/13 0543) Pulse Rate:  [52-71] 71 (04/13 1029) Resp:  [16-30] 20 (04/13 0543) BP: (111-176)/(44-90) 115/44 mmHg (04/13 1029) SpO2:  [95 %-100 %] 95 % (04/13 0543) Weight:  [80.2 kg (176 lb 12.9 oz)] 80.2 kg (176 lb 12.9 oz) (04/13 0147)  Intake/Output from previous day: 04/12 0701 - 04/13 0700 In: 240 [P.O.:240] Out: 4150 [Urine:4150] Intake/Output this shift: Total I/O In: 220 [P.O.:220] Out: -   Motor function still reveals marked weakness and left hand and left lower extremity with 3 to fours and fives. Extensor strength in hands is particularly weak. Right side is weak also. Strength appears 4/5 throughout  Lab Results:  Recent Labs  12/25/12 1221 12/26/12 0700  WBC 7.5 7.9  HGB 10.4* 10.8*  HCT 30.2* 31.9*  PLT 208 226   BMET  Recent Labs  12/25/12 1221 12/26/12 0700  NA 130* 137  K 4.3 3.9  CL 94* 100  CO2 27 26  GLUCOSE 93 84  BUN 50* 40*  CREATININE 1.56* 1.22*  CALCIUM 9.5 9.6    Studies/Results: Dg Hip Complete Left  12/25/2012  *RADIOLOGY REPORT*  Clinical Data: Fall and left hip pain.  LEFT HIP - COMPLETE 2+ VIEW  Comparison: Lumbar spine 03/07/2008  Findings: AP view of the pelvis and two views of the left hip were obtained.  The pelvic bony ring is intact.  There is mild joint space narrowing in both hips. There appears to osteophytes near the left femoral head and neck junction.  Difficult to exclude a subtle injury or fracture at this location.  Otherwise, there is no fracture or dislocation.  Symmetric appearance of the sacroiliac joints.  IMPRESSION: No definite fracture or dislocation involving pelvis or left hip. There is mild irregularity at the junction of the left femoral head and neck which could be related to osteophytes.  If there is high clinical  concern for an acute fracture, recommend further evaluation with CT or MRI.   Original Report Authenticated By: Richarda Overlie, M.D.    Ct Head Wo Contrast  12/25/2012  *RADIOLOGY REPORT*  Clinical Data:  Fall, head injury  CT HEAD WITHOUT CONTRAST CT MAXILLOFACIAL WITHOUT CONTRAST CT CERVICAL SPINE WITHOUT CONTRAST  Technique:  Multidetector CT imaging of the head, cervical spine, and maxillofacial structures were performed using the standard protocol without intravenous contrast. Multiplanar CT image reconstructions of the cervical spine and maxillofacial structures were also generated.  Comparison:  None.  CT HEAD  Findings: No evidence of parenchymal hemorrhage or extra-axial fluid collection. No mass lesion, mass effect, or midline shift.  No CT evidence of acute infarction.  Subcortical white matter and periventricular small vessel ischemic changes.  Intracranial atherosclerosis.  Global cortical atrophy.  No ventriculomegaly.  Mild mucosal thickening in the bilateral ethmoid sinuses. Mastoid surgery bilaterally.  No evidence of calvarial fracture.  IMPRESSION: No evidence of acute intracranial abnormality.  Atrophy with small vessel ischemic changes and intracranial atherosclerosis.  CT MAXILLOFACIAL  Findings:  No evidence of maxillofacial fracture.  Mild mucosal thickening in the bilateral ethmoid sinuses. Chronic changes involving the left maxillary sinus.  Prior bilateral mastoid surgery.  The bilateral orbits, including the globes and retroconal soft tissues, are within normal limits.  IMPRESSION: No evidence of maxillofacial fracture.  CT CERVICAL SPINE  Findings:   Exaggerated cervical lordosis.  Type 2 dens fracture (series 900/image 25).  Anterior inferior corner fracture at C5 (series 900/image 26), displaced.  Moderate multilevel degenerative changes.  No prevertebral soft tissue swelling.  Visualized thyroid is heterogeneous/nodular.  Vascular calcifications.  Visualized lung apices are clear.   IMPRESSION: Type 2 dens fracture.  Displaced anterior inferior corner fracture at C5.  Critical Value/emergent results were called by telephone at the time of interpretation on 12/25/2012 at 1425 hours to Dr. Donnetta Hutching, who verbally acknowledged these results.   Original Report Authenticated By: Charline Bills, M.D.    Ct Cervical Spine Wo Contrast  12/25/2012  *RADIOLOGY REPORT*  Clinical Data:  Fall, head injury  CT HEAD WITHOUT CONTRAST CT MAXILLOFACIAL WITHOUT CONTRAST CT CERVICAL SPINE WITHOUT CONTRAST  Technique:  Multidetector CT imaging of the head, cervical spine, and maxillofacial structures were performed using the standard protocol without intravenous contrast. Multiplanar CT image reconstructions of the cervical spine and maxillofacial structures were also generated.  Comparison:  None.  CT HEAD  Findings: No evidence of parenchymal hemorrhage or extra-axial fluid collection. No mass lesion, mass effect, or midline shift.  No CT evidence of acute infarction.  Subcortical white matter and periventricular small vessel ischemic changes.  Intracranial atherosclerosis.  Global cortical atrophy.  No ventriculomegaly.  Mild mucosal thickening in the bilateral ethmoid sinuses. Mastoid surgery bilaterally.  No evidence of calvarial fracture.  IMPRESSION: No evidence of acute intracranial abnormality.  Atrophy with small vessel ischemic changes and intracranial atherosclerosis.  CT MAXILLOFACIAL  Findings:  No evidence of maxillofacial fracture.  Mild mucosal thickening in the bilateral ethmoid sinuses. Chronic changes involving the left maxillary sinus.  Prior bilateral mastoid surgery.  The bilateral orbits, including the globes and retroconal soft tissues, are within normal limits.  IMPRESSION: No evidence of maxillofacial fracture.  CT CERVICAL SPINE  Findings:   Exaggerated cervical lordosis.  Type 2 dens fracture (series 900/image 25).  Anterior inferior corner fracture at C5 (series 900/image 26),  displaced.  Moderate multilevel degenerative changes.  No prevertebral soft tissue swelling.  Visualized thyroid is heterogeneous/nodular.  Vascular calcifications.  Visualized lung apices are clear.  IMPRESSION: Type 2 dens fracture.  Displaced anterior inferior corner fracture at C5.  Critical Value/emergent results were called by telephone at the time of interpretation on 12/25/2012 at 1425 hours to Dr. Donnetta Hutching, who verbally acknowledged these results.   Original Report Authenticated By: Charline Bills, M.D.    Mr Cervical Spine W Wo Contrast  12/25/2012  *RADIOLOGY REPORT*  Clinical Data: Quadrant parapsoas.  Cervical spine fractures after a fall today.  MRI CERVICAL SPINE WITHOUT CONTRAST  Technique:  Multiplanar and multiecho pulse sequences of the cervical spine, to include the craniocervical junction and cervicothoracic junction, were obtained according to standard protocol without intravenous contrast.  Comparison: CT cervical spine 12/25/2012.  Findings: The patient is severely claustrophobic.  Efforts were made to [facility] anxiety with medication and positioning.  Images are suboptimal and best obtainable.  Type 2 odontoid fracture without displacement.  Mild pannus surrounds the odontoid.  There is no craniocervical junction narrowing.  No evidence for vertebral artery injury.  Anterior inferior osteophytic fracture of C5 extends to the disc space,  without significant displacement/subluxation. No post traumatic disc herniation at C5-6. Slight prevertebral soft tissue swelling.  Severe disc space narrowing at C4-5, in combination with facet arthropathy and ligamentum flavum calcification, results in severe spinal stenosis measuring 3 mm left and 4 mm right.  Slightly caudad to C4-5, the cord is mildly  hyperintense dorsally (image 3 series 4, image 14 series 8).  There is no intraspinal hematoma or hematomyelia, and this cord hyperintensity may represent chronic myelomalacia although central cord  syndrome with edema not excluded.  Due to severe compression and lack of signal from the most severely narrowed segment, I am unable to assess for gliosis at C4-5.  Sagittal gradient sequence for trauma is suboptimal, but I am fairly confident there is no hematomyelia or intraspinal hematoma.  Moderate to severe stenosis is present at C3-4 with posterior osteophytic spurring.  Canal diameter at this level is improved over that at C4-5, measuring 6 mm AP diameter.  Mild spondylosis at C6-7 and C7-T1.  Facet mediated slip of 3 mm at T1-2 is non compressive.  IMPRESSION: Type 2 odontoid fracture and anterior inferior endplate C5 fracture as described above, without associated intraspinal hemorrhage or hematomyelia.  Severe multifactorial spondylosis C4-5 with critical cord compression. Canal diameter 3-4 mm.  Slight cord hyperintensity just caudad to the severe stenosis not clearly post traumatic, and may represent chronic myelomalacia versus acute central cord syndrome.  Findings discussed with Dr. Danielle Dess.   Original Report Authenticated By: Davonna Belling, M.D.    Ct Hip Left Wo Contrast  12/26/2012  *RADIOLOGY REPORT*  Clinical Data: Fall.  Left hip pain and weakness.  CT OF THE LEFT HIP WITHOUT CONTRAST  Technique:  Multidetector CT imaging was performed according to the standard protocol. Multiplanar CT image reconstructions were also generated.  Comparison: Plain films 12/25/2012.  Findings: No fracture is identified.  The patient has right hip osteoarthritis with collar osteophytes present about the humeral head and joint space narrowing.  Enthesopathic change about the greater trochanter is identified.  No hip joint effusion is seen. Small exostosis off the superior pubic ramus is incidentally noted. Imaged intrapelvic contents show no focal abnormality.  IMPRESSION:  1.  Negative for fracture. 2.  Advanced osteoarthritis left hip.   Original Report Authenticated By: Holley Dexter, M.D.    Dg Shoulder  Left  12/25/2012  *RADIOLOGY REPORT*  Clinical Data: Fall.  LEFT SHOULDER - 2+ VIEW  Comparison: Chest radiograph 03/19/2008  Findings: Three views of the left shoulder are negative for a fracture or dislocation.  The ribs appear to be intact.  No definite pneumothorax. Degenerative changes in the lower cervical spine.  IMPRESSION: No acute bony abnormality in the left shoulder.   Original Report Authenticated By: Richarda Overlie, M.D.    Dg Hand Complete Left  12/25/2012  *RADIOLOGY REPORT*  Clinical Data: Larey Seat and injured left hand.  LEFT HAND - COMPLETE 3+ VIEW  Comparison: None.  Findings: Severe generalized osseous demineralization.  Joint space narrowing with associated hypertrophic changes involving all of the joints of the wrist, with associated soft tissue swelling.  Joint space narrowing involving the IP joints of all of the fingers.  No evidence of an acute fracture or dislocation.  IMPRESSION: No acute osseous abnormality.  Severe osseous demineralization. Severe osteoarthritis involving the wrist.  Moderate osteoarthritis involving the IP joints of the hand.   Original Report Authenticated By: Hulan Saas, M.D.    Ct Maxillofacial Wo Cm  12/25/2012  *RADIOLOGY REPORT*  Clinical Data:  Fall, head injury  CT HEAD WITHOUT CONTRAST CT MAXILLOFACIAL WITHOUT CONTRAST CT CERVICAL SPINE WITHOUT CONTRAST  Technique:  Multidetector CT imaging of the head, cervical spine, and maxillofacial structures were performed using the standard protocol without intravenous contrast. Multiplanar CT image reconstructions of the cervical spine and maxillofacial structures were also generated.  Comparison:  None.  CT HEAD  Findings: No evidence of parenchymal hemorrhage or extra-axial fluid collection. No mass lesion, mass effect, or midline shift.  No CT evidence of acute infarction.  Subcortical white matter and periventricular small vessel ischemic changes.  Intracranial atherosclerosis.  Global cortical atrophy.  No  ventriculomegaly.  Mild mucosal thickening in the bilateral ethmoid sinuses. Mastoid surgery bilaterally.  No evidence of calvarial fracture.  IMPRESSION: No evidence of acute intracranial abnormality.  Atrophy with small vessel ischemic changes and intracranial atherosclerosis.  CT MAXILLOFACIAL  Findings:  No evidence of maxillofacial fracture.  Mild mucosal thickening in the bilateral ethmoid sinuses. Chronic changes involving the left maxillary sinus.  Prior bilateral mastoid surgery.  The bilateral orbits, including the globes and retroconal soft tissues, are within normal limits.  IMPRESSION: No evidence of maxillofacial fracture.  CT CERVICAL SPINE  Findings:   Exaggerated cervical lordosis.  Type 2 dens fracture (series 900/image 25).  Anterior inferior corner fracture at C5 (series 900/image 26), displaced.  Moderate multilevel degenerative changes.  No prevertebral soft tissue swelling.  Visualized thyroid is heterogeneous/nodular.  Vascular calcifications.  Visualized lung apices are clear.  IMPRESSION: Type 2 dens fracture.  Displaced anterior inferior corner fracture at C5.  Critical Value/emergent results were called by telephone at the time of interpretation on 12/25/2012 at 1425 hours to Dr. Donnetta Hutching, who verbally acknowledged these results.   Original Report Authenticated By: Charline Bills, M.D.     Assessment/Plan: Review of patient's MRI demonstrates that she has advanced and severe spondylitic stenosis at C4-5. As above the area of the fracture. There is edema in the cord at this level. The MRI of his of poor quality secondary to some motion artifact. Nonetheless the stenosis is consistent with her clinical findings. At the site of the fracture C5-6 there is no significant disruption of the then in the disc itself. We can treat this conservatively however the myelopathy likely needs to be decompressed. I discussed the surgery at length with the patient's daughter and the patient herself  the family would like to consider whether 12 have her undergo the surgery as I noted there are significant risks with the surgery resulted 5% chance of significant clinical worsening with the surgery. Otherwise conservative management is likely not yield much improvement. I did suggest that we can try some steroid medication in the meantime. I'll start on Decadron.  LOS: 1 day  Start Decadron.   [name] 12/26/2012, 11:50 AM

## 2012-12-26 NOTE — Progress Notes (Signed)
Spoke to DR Danielle Dess who told me that from his standpoint the patient is ok to get OOB and work with PT  Minor, Morrie Sheldon Caz Weaver

## 2012-12-26 NOTE — Evaluation (Signed)
Physical Therapy Evaluation Patient Details Name: [name] MRN: [id number] DOB: 17-May-1931 Today's Date: 12/26/2012 Time: 7829-5621 PT Time Calculation (min): 32 min  PT Assessment / Plan / Recommendation Clinical Impression  Pt fell at home early Thursday AM sustaining cervical fxs.  Neuro believes swelling at the fx site is responsible for motor and sensory changes noted primarily in LUE.  No bony impingement or disc disruption noted on MRI.  PT indicated to maximize functional mobility in acute care setting.    PT Assessment  Patient needs continued PT services    Follow Up Recommendations  SNF;Supervision/Assistance - 24 hour (Pt interested in [facility].)    Does the patient have the potential to tolerate intense rehabilitation      Barriers to Discharge Decreased caregiver support      Equipment Recommendations  None recommended by PT    Recommendations for Other Services     Frequency Min 3X/week    Precautions / Restrictions Precautions Precautions: Cervical Precaution Comments: log rolling Required Braces or Orthoses: Cervical Brace Cervical Brace: Soft collar   Pertinent Vitals/Pain       Mobility  Bed Mobility Bed Mobility: Sitting - Scoot to Edge of Bed Rolling Left: 3: Mod assist Left Sidelying to Sit: 3: Mod assist Sitting - Scoot to Edge of Bed: 3: Mod assist Details for Bed Mobility Assistance: verbal cues for sequencing Transfers Transfers: Stand Pivot Transfers Sit to Stand: 1: +2 Total assist;From bed Sit to Stand: Patient Percentage: 60% Stand to Sit: 1: +2 Total assist;With upper extremity assist;To chair/3-in-1 Stand to Sit: Patient Percentage: 60% Stand Pivot Transfers: 1: +2 Total assist Stand Pivot Transfers: Patient Percentage: 60% Details for Transfer Assistance: +2 using gait belt with support to BUE at shoulder/elbow. Stand step to chair    Exercises Other Exercises Other Exercises: mod resistance theraputty Other  Exercises: finger lifts x 10 B hands from side table Other Exercises: opposition B hands x 10 reps   PT Diagnosis: Difficulty walking;Acute pain;Generalized weakness  PT Problem List: Decreased strength;Decreased activity tolerance;Decreased balance;Decreased mobility;Pain;Impaired sensation;Decreased safety awareness PT Treatment Interventions: DME instruction;Gait training;Functional mobility training;Therapeutic activities;Therapeutic exercise;Patient/family education;Balance training   PT Goals Acute Rehab PT Goals PT Goal Formulation: With patient Time For Goal Achievement: 01/09/13 Potential to Achieve Goals: Good Pt will go Supine/Side to Sit: with min assist PT Goal: Supine/Side to Sit - Progress: Goal set today Pt will go Sit to Supine/Side: with min assist PT Goal: Sit to Supine/Side - Progress: Goal set today Pt will go Sit to Stand: with min assist PT Goal: Sit to Stand - Progress: Goal set today Pt will go Stand to Sit: with min assist PT Goal: Stand to Sit - Progress: Goal set today Pt will Transfer Bed to Chair/Chair to Bed: with min assist PT Transfer Goal: Bed to Chair/Chair to Bed - Progress: Goal set today Pt will Ambulate: 1 - 15 feet;with rolling walker;with mod assist PT Goal: Ambulate - Progress: Goal set today  Visit Information  Last PT Received On: 12/26/12 Assistance Needed: +2    Subjective Data  Subjective: "I fell out of the bed onto my face." Patient Stated Goal: home   Prior Functioning  Home Living Lives With: Alone Available Help at Discharge: Available PRN/intermittently Type of Home: House Home Access: Stairs to enter [employer] of Steps: 3 Entrance Stairs-Rails: Left Home Layout: One level Bathroom Shower/Tub: Walk-in shower;Door [employer] Toilet: Standard Bathroom Accessibility: Yes How Accessible: Accessible via walker Home Adaptive Equipment: Dan Humphreys -  rolling;Straight cane;Shower chair with back Prior Function Level of  Independence: Independent Able to Take Stairs?: Yes Driving: No Vocation: Retired Musician: No difficulties Dominant Hand: Right    Cognition  Cognition Overall Cognitive Status: Appears within functional limits for tasks assessed/performed Arousal/Alertness: Awake/alert Orientation Level: Appears intact for tasks assessed Behavior During Session: [facility] for tasks performed    Extremity/Trunk Assessment Right Upper Extremity Assessment RUE ROM/Strength/Tone: Deficits RUE ROM/Strength/Tone Deficits: AROM WFL proximally. Deficits with in hand manipulation . strength forarm/wrist 3+/5. Hand @ 3/7. Poor coordination RUE Sensation: Deficits RUE Sensation Deficits: diminished light touch and propiroception. R hand RUE Coordination: Deficits RUE Coordination Deficits: ataxic Left Upper Extremity Assessment LUE ROM/Strength/Tone: Deficits LUE ROM/Strength/Tone Deficits: LUE more afrected than R. Proaximal strength @ 4+/5. decreasedAROM in C5-8 distribution. Able to make gross grasp. Strength @ 2+/5. extensor lag, unable to extend 3 - 5 digits. Unable to make functional pinch. LUE Sensation: Deficits LUE Sensation Deficits: decreased. greater sensory deficit L hand LUE Coordination: Deficits LUE Coordination Deficits: ataxic movments Right Lower Extremity Assessment RLE ROM/Strength/Tone: Deficits RLE ROM/Strength/Tone Deficits: 4/5 RLE Sensation: Deficits RLE Sensation Deficits: distally diminished Left Lower Extremity Assessment LLE ROM/Strength/Tone: Deficits LLE ROM/Strength/Tone Deficits: 4/5 LLE Sensation: Deficits LLE Sensation Deficits: distally diminished Trunk Assessment Trunk Assessment: Kyphotic   Balance Balance Balance Assessed: Yes Static Sitting Balance Static Sitting - Balance Support: Bilateral upper extremity supported;Feet supported Static Sitting - Level of Assistance: 3: Mod assist Static Sitting - Comment/# of Minutes: 10  End of Session PT  - End of Session Equipment Utilized During Treatment: Gait belt;Cervical collar Activity Tolerance: Patient tolerated treatment well Patient left: in chair;with call bell/phone within reach;with family/visitor present;Other (comment) (OT in room) Nurse Communication: Mobility status  GP     Ilda Foil 12/26/2012, 3:42 PM  Aida Raider, PT  Office # [phone] Pager [phone]

## 2012-12-26 NOTE — Progress Notes (Signed)
PT Cancellation Note  Patient Details Name: Nayelie Gionfriddo Linehan MRN: [id number] DOB: 1930-11-01   Cancelled Treatment:    Reason Eval/Treat Not Completed: Medical issues which prohibited therapy.  Hold PT per MD.  Awaiting neuro consult following cervical MRI and results of hip CT.   Ilda Foil 12/26/2012, 9:30 AM  Aida Raider, PT  Office # [phone] Pager [phone]

## 2012-12-26 NOTE — Progress Notes (Signed)
Utilization review completed.  

## 2012-12-26 NOTE — Progress Notes (Signed)
Occupational Therapy Treatment Patient Details Name: [name] MRN: [id number] DOB: [birth year] Today's Date: 12/26/2012 Time: 1510-1534 OT Time Calculation (min): 24 min  OT Assessment / Plan / Recommendation Comments on Treatment Session Pt very motivated to progress with OT. PT taught theraputty ex, in addition to extensor strengthening exercises. Given written handouts. Pt will need rehab at SNF. will follow acutely to facilitate D/C to [facility] for rehab.    Follow Up Recommendations  SNF    Barriers to Discharge  Decreased caregiver support    Equipment Recommendations  3 in 1 bedside comode;Tub/shower seat    Recommendations for Other Services    Frequency Min 3X/week   Plan Discharge plan remains appropriate    Precautions / Restrictions Precautions Precautions: Cervical Precaution Comments: log rolling Required Braces or Orthoses: Cervical Brace Cervical Brace: Soft collar   Pertinent Vitals/Pain no apparent distress     ADL  Eating/Feeding: Moderate assistance Where Assessed - Eating/Feeding: Chair Grooming: Moderate assistance Where Assessed - Grooming: Supported sitting Upper Body Bathing: Maximal assistance Where Assessed - Upper Body Bathing: Supported sitting Lower Body Bathing: +1 Total assistance Where Assessed - Lower Body Bathing: Supported sit to stand Upper Body Dressing: Maximal assistance Where Assessed - Upper Body Dressing: Supported sitting Lower Body Dressing: +1 Total assistance Where Assessed - Lower Body Dressing: Supported sit to Pharmacist, hospital: +2 Total assistance Toilet Transfer: Patient Percentage: 60% Statistician Method: Surveyor, minerals: Other (comment) (simulated) Toileting - Clothing Manipulation and Hygiene: +1 Total assistance Where Assessed - Toileting Clothing Manipulation and Hygiene: Sit to stand from 3-in-1 or toilet Equipment Used: Gait belt Transfers/Ambulation Related to ADLs: +2 Mode  A ADL Comments: Focus of session oneducating familyon use of red tubing to build up handles forutensils and toothbrush. Able to feed self with spoon after set up. Educated pt on using B hands to manage cup with lid for drinking. also taught pt/family theraputty ex.    OT Diagnosis: Generalized weakness;Acute pain;Paresis;Ataxia  OT Problem List: Decreased strength;Decreased range of motion;Impaired balance (sitting and/or standing);Decreased activity tolerance;Decreased coordination;Decreased knowledge of use of DME or AE;Decreased knowledge of precautions;Impaired sensation;Impaired tone;Impaired UE functional use;Pain OT Treatment Interventions: Self-care/ADL training;Therapeutic exercise;Neuromuscular education;Energy conservation;DME and/or AE instruction;Therapeutic activities;Patient/family education;Balance training   OT Goals Acute Rehab OT Goals OT Goal Formulation: With patient Time For Goal Achievement: 01/12/13 Potential to Achieve Goals: Good ADL Goals Pt Will Perform Eating: with set-up;with supervision;Sitting, chair;Supported;with assist to don/doff brace/orthosis;with adaptive utensils;with cueing (comment type and amount) ADL Goal: Eating - Progress: Progressing toward goals Pt Will Perform Grooming: with min assist;with caregiver independent in assisting;Sitting, chair;Supported;with adaptive equipment;with cueing (comment type and amount) ADL Goal: Grooming - Progress: Progressing toward goals Pt Will Perform Upper Body Bathing: with min assist;Sitting, chair;Supported;with adaptive equipment ADL Goal: Upper Body Bathing - Progress: Progressing toward goals Pt Will Transfer to Toilet: with min assist;Squat pivot transfer;with DME;3-in-1;Maintaining back safety precautions ADL Goal: Toilet Transfer - Progress: Progressing toward goals Pt Will Perform Toileting - Hygiene: with min assist;Leaning right and/or left on 3-in-1/toilet ADL Goal: Toileting - Hygiene - Progress:  Progressing toward goals Additional ADL Goal #1: Pt will maintain sitting balance EOB x 10 mini with SBA in preparation for ADL retarining ADL Goal: Additional Goal #1 - Progress: Progressing toward goals Arm Goals Pt Will Complete Theraputty Exer: with supervision, verbal cues required/provided;to increase strength;Bilateral upper extremities;Min resistance putty Arm Goal: Theraputty Exercises - Progress: Progressing toward goal Additional Arm Goal #1: Pt/family will complete  BUE strengthening HEP to increase funcitonal use B hands. Arm Goal: Additional Goal #1 - Progress: Progressing toward goals  Visit Information  Last OT Received On: 12/26/12 Assistance Needed: +2    Subjective Data      Prior Functioning  Home Living Lives With: Alone Available Help at Discharge: Available PRN/intermittently Type of Home: House Home Access: Stairs to enter [employer] of Steps: 3 Entrance Stairs-Rails: Left Home Layout: One level Bathroom Shower/Tub: Walk-in shower;Door [employer] Toilet: Standard Bathroom Accessibility: Yes How Accessible: Accessible via walker Home Adaptive Equipment: Walker - rolling;Straight cane;Shower chair with back Prior Function Level of Independence: Independent Able to Take Stairs?: Yes Driving: No Vocation: Retired Musician: No difficulties Dominant Hand: Right    Cognition  Cognition Overall Cognitive Status: Appears within functional limits for tasks assessed/performed Arousal/Alertness: Awake/alert Orientation Level: Appears intact for tasks assessed Behavior During Session: [facility] for tasks performed    Mobility  Bed Mobility Bed Mobility: Sitting - Scoot to Edge of Bed Rolling Left: 3: Mod assist Left Sidelying to Sit: 3: Mod assist Sitting - Scoot to Edge of Bed: 3: Mod assist Details for Bed Mobility Assistance: verbal cues for sequencing Transfers Transfers: Sit to Stand;Stand to Sit Sit to Stand: 1: +2 Total  assist;From bed Sit to Stand: Patient Percentage: 60% Stand to Sit: 1: +2 Total assist;With upper extremity assist;To chair/3-in-1 Stand to Sit: Patient Percentage: 60% Details for Transfer Assistance: +2 using gait belt with support to BUE at shoulder/elbow. Stand step to chair    Exercises  Other Exercises Other Exercises: mod resistance theraputty Other Exercises: finger lifts x 10 B hands from side table Other Exercises: opposition B hands x 10 reps   Balance Balance Balance Assessed: Yes Static Sitting Balance Static Sitting - Balance Support: Bilateral upper extremity supported;Feet supported Static Sitting - Level of Assistance: 3: Mod assist Static Sitting - Comment/# of Minutes: 10   End of Session OT - End of Session Equipment Utilized During Treatment: Gait belt Activity Tolerance: Patient tolerated treatment well Patient left: in chair;with call bell/phone within reach;with family/visitor present Nurse Communication: Mobility status;Precautions;Other (comment)  GO     Zaiya Annunziato,HILLARY 12/26/2012, 3:39 PM [facility], OTR/L  [phone] 12/26/2012

## 2012-12-26 NOTE — Progress Notes (Signed)
Patient ID: [name]  female  JYN:[id number]    DOB: 1931-07-03    DOA: 12/25/2012  PCP: [name], MD  Assessment/Plan: Principal Problem:   Cervical spine fracture with fall from the bed - Continue c-collar - MRI C-spine shows fracture C5, neurosurgery consulted, will follow recommendations  - Physical therapy when cleared by neurosurgery - Continue pain control, will likely need skilled nursing facility  Active Problems:  Nasal swelling: Rule out any fracture, ordered x-ray    Hypertension with history of diastolic CHF: Currently stable    Osteoporosis with history of Spinal stenosis of lumbar region chronic back pain - Patient has been living at home, ambulates with a walker but fell out of bed resulting in cervical spine fracture - Continue pain control    Hip pain- LEFT - CT of the hip done results pending, if she has fracture, will consult orthopedics    AKI (acute kidney injury)- improved with IV fluid hydration    Hyponatremia- resolved  DVT Prophylaxis: Lovenox  Code Status: Full CODE STATUS  Disposition: May likely need skilled nursing facility    Subjective: Alert and oriented, no specific complaints at this time, awaiting results of the imaging. Right eye ecchymosis. Daughter at the bedside  Objective: Weight change:   Intake/Output Summary (Last 24 hours) at 12/26/12 1023 Last data filed at 12/26/12 0844  Gross per 24 hour  Intake    460 ml  Output   4150 ml  Net  -3690 ml   Blood pressure 142/53, pulse 62, temperature 98.8 F (37.1 C), temperature source Oral, resp. rate 20, height 5\' 4"  (1.626 m), weight 80.2 kg (176 lb 12.9 oz), SpO2 95.00%.  Physical Exam: General: Alert and awake, oriented x3, not in any acute distress. C collar HEENT: Significant ecchymosis around the right eye, swelling of the nasal bone CVS: S1-S2 clear, no murmur rubs or gallops Chest: CTAB Abdomen: soft nontender, nondistended, normal bowel  sounds Extremities: no cyanosis, clubbing or edema noted bilaterally Neuro: Moving all 4 extremities   Lab Results: Basic Metabolic Panel:  Recent Labs Lab 12/25/12 1221 12/26/12 0700  NA 130* 137  K 4.3 3.9  CL 94* 100  CO2 27 26  GLUCOSE 93 84  BUN 50* 40*  CREATININE 1.56* 1.22*  CALCIUM 9.5 9.6   Liver Function Tests: No results found for this basename: AST, ALT, ALKPHOS, BILITOT, PROT, ALBUMIN,  in the last 168 hours No results found for this basename: LIPASE, AMYLASE,  in the last 168 hours No results found for this basename: AMMONIA,  in the last 168 hours CBC:  Recent Labs Lab 12/25/12 1221 12/26/12 0700  WBC 7.5 7.9  NEUTROABS 5.2  --   HGB 10.4* 10.8*  HCT 30.2* 31.9*  MCV 91.0 91.1  PLT 208 226   Cardiac Enzymes:  Recent Labs Lab 12/25/12 1524 12/25/12 1820  CKTOTAL  --  2344*  TROPONINI <0.30  --    BNP: No components found with this basename: POCBNP,  CBG: No results found for this basename: GLUCAP,  in the last 168 hours   Micro Results: No results found for this or any previous visit (from the past 240 hour(s)).  Studies/Results: Dg Hip Complete Left  12/25/2012  *RADIOLOGY REPORT*  Clinical Data: Fall and left hip pain.  LEFT HIP - COMPLETE 2+ VIEW  Comparison: Lumbar spine 03/07/2008  Findings: AP view of the pelvis and two views of the left hip were obtained.  The pelvic bony ring is  intact.  There is mild joint space narrowing in both hips. There appears to osteophytes near the left femoral head and neck junction.  Difficult to exclude a subtle injury or fracture at this location.  Otherwise, there is no fracture or dislocation.  Symmetric appearance of the sacroiliac joints.  IMPRESSION: No definite fracture or dislocation involving pelvis or left hip. There is mild irregularity at the junction of the left femoral head and neck which could be related to osteophytes.  If there is high clinical concern for an acute fracture, recommend further  evaluation with CT or MRI.   Original Report Authenticated By: Richarda Overlie, M.D.    Ct Head Wo Contrast  12/25/2012  *RADIOLOGY REPORT*  Clinical Data:  Fall, head injury  CT HEAD WITHOUT CONTRAST CT MAXILLOFACIAL WITHOUT CONTRAST CT CERVICAL SPINE WITHOUT CONTRAST  Technique:  Multidetector CT imaging of the head, cervical spine, and maxillofacial structures were performed using the standard protocol without intravenous contrast. Multiplanar CT image reconstructions of the cervical spine and maxillofacial structures were also generated.  Comparison:  None.  CT HEAD  Findings: No evidence of parenchymal hemorrhage or extra-axial fluid collection. No mass lesion, mass effect, or midline shift.  No CT evidence of acute infarction.  Subcortical white matter and periventricular small vessel ischemic changes.  Intracranial atherosclerosis.  Global cortical atrophy.  No ventriculomegaly.  Mild mucosal thickening in the bilateral ethmoid sinuses. Mastoid surgery bilaterally.  No evidence of calvarial fracture.  IMPRESSION: No evidence of acute intracranial abnormality.  Atrophy with small vessel ischemic changes and intracranial atherosclerosis.  CT MAXILLOFACIAL  Findings:  No evidence of maxillofacial fracture.  Mild mucosal thickening in the bilateral ethmoid sinuses. Chronic changes involving the left maxillary sinus.  Prior bilateral mastoid surgery.  The bilateral orbits, including the globes and retroconal soft tissues, are within normal limits.  IMPRESSION: No evidence of maxillofacial fracture.  CT CERVICAL SPINE  Findings:   Exaggerated cervical lordosis.  Type 2 dens fracture (series 900/image 25).  Anterior inferior corner fracture at C5 (series 900/image 26), displaced.  Moderate multilevel degenerative changes.  No prevertebral soft tissue swelling.  Visualized thyroid is heterogeneous/nodular.  Vascular calcifications.  Visualized lung apices are clear.  IMPRESSION: Type 2 dens fracture.  Displaced anterior  inferior corner fracture at C5.  Critical Value/emergent results were called by telephone at the time of interpretation on 12/25/2012 at 1425 hours to Dr. Donnetta Hutching, who verbally acknowledged these results.   Original Report Authenticated By: Charline Bills, M.D.    Ct Cervical Spine Wo Contrast  12/25/2012  *RADIOLOGY REPORT*  Clinical Data:  Fall, head injury  CT HEAD WITHOUT CONTRAST CT MAXILLOFACIAL WITHOUT CONTRAST CT CERVICAL SPINE WITHOUT CONTRAST  Technique:  Multidetector CT imaging of the head, cervical spine, and maxillofacial structures were performed using the standard protocol without intravenous contrast. Multiplanar CT image reconstructions of the cervical spine and maxillofacial structures were also generated.  Comparison:  None.  CT HEAD  Findings: No evidence of parenchymal hemorrhage or extra-axial fluid collection. No mass lesion, mass effect, or midline shift.  No CT evidence of acute infarction.  Subcortical white matter and periventricular small vessel ischemic changes.  Intracranial atherosclerosis.  Global cortical atrophy.  No ventriculomegaly.  Mild mucosal thickening in the bilateral ethmoid sinuses. Mastoid surgery bilaterally.  No evidence of calvarial fracture.  IMPRESSION: No evidence of acute intracranial abnormality.  Atrophy with small vessel ischemic changes and intracranial atherosclerosis.  CT MAXILLOFACIAL  Findings:  No evidence of maxillofacial fracture.  Mild mucosal thickening in the bilateral ethmoid sinuses. Chronic changes involving the left maxillary sinus.  Prior bilateral mastoid surgery.  The bilateral orbits, including the globes and retroconal soft tissues, are within normal limits.  IMPRESSION: No evidence of maxillofacial fracture.  CT CERVICAL SPINE  Findings:   Exaggerated cervical lordosis.  Type 2 dens fracture (series 900/image 25).  Anterior inferior corner fracture at C5 (series 900/image 26), displaced.  Moderate multilevel degenerative changes.   No prevertebral soft tissue swelling.  Visualized thyroid is heterogeneous/nodular.  Vascular calcifications.  Visualized lung apices are clear.  IMPRESSION: Type 2 dens fracture.  Displaced anterior inferior corner fracture at C5.  Critical Value/emergent results were called by telephone at the time of interpretation on 12/25/2012 at 1425 hours to Dr. Donnetta Hutching, who verbally acknowledged these results.   Original Report Authenticated By: Charline Bills, M.D.    Mr Cervical Spine W Wo Contrast  12/25/2012  *RADIOLOGY REPORT*  Clinical Data: Quadrant parapsoas.  Cervical spine fractures after a fall today.  MRI CERVICAL SPINE WITHOUT CONTRAST  Technique:  Multiplanar and multiecho pulse sequences of the cervical spine, to include the craniocervical junction and cervicothoracic junction, were obtained according to standard protocol without intravenous contrast.  Comparison: CT cervical spine 12/25/2012.  Findings: The patient is severely claustrophobic.  Efforts were made to [facility] anxiety with medication and positioning.  Images are suboptimal and best obtainable.  Type 2 odontoid fracture without displacement.  Mild pannus surrounds the odontoid.  There is no craniocervical junction narrowing.  No evidence for vertebral artery injury.  Anterior inferior osteophytic fracture of C5 extends to the disc space,  without significant displacement/subluxation. No post traumatic disc herniation at C5-6. Slight prevertebral soft tissue swelling.  Severe disc space narrowing at C4-5, in combination with facet arthropathy and ligamentum flavum calcification, results in severe spinal stenosis measuring 3 mm left and 4 mm right.  Slightly caudad to C4-5, the cord is mildly hyperintense dorsally (image 3 series 4, image 14 series 8).  There is no intraspinal hematoma or hematomyelia, and this cord hyperintensity may represent chronic myelomalacia although central cord syndrome with edema not excluded.  Due to severe  compression and lack of signal from the most severely narrowed segment, I am unable to assess for gliosis at C4-5.  Sagittal gradient sequence for trauma is suboptimal, but I am fairly confident there is no hematomyelia or intraspinal hematoma.  Moderate to severe stenosis is present at C3-4 with posterior osteophytic spurring.  Canal diameter at this level is improved over that at C4-5, measuring 6 mm AP diameter.  Mild spondylosis at C6-7 and C7-T1.  Facet mediated slip of 3 mm at T1-2 is non compressive.  IMPRESSION: Type 2 odontoid fracture and anterior inferior endplate C5 fracture as described above, without associated intraspinal hemorrhage or hematomyelia.  Severe multifactorial spondylosis C4-5 with critical cord compression. Canal diameter 3-4 mm.  Slight cord hyperintensity just caudad to the severe stenosis not clearly post traumatic, and may represent chronic myelomalacia versus acute central cord syndrome.  Findings discussed with Dr. Danielle Dess.   Original Report Authenticated By: Davonna Belling, M.D.    Dg Shoulder Left  12/25/2012  *RADIOLOGY REPORT*  Clinical Data: Fall.  LEFT SHOULDER - 2+ VIEW  Comparison: Chest radiograph 03/19/2008  Findings: Three views of the left shoulder are negative for a fracture or dislocation.  The ribs appear to be intact.  No definite pneumothorax. Degenerative changes in the lower cervical spine.  IMPRESSION: No acute bony abnormality  in the left shoulder.   Original Report Authenticated By: Richarda Overlie, M.D.    Dg Hand Complete Left  12/25/2012  *RADIOLOGY REPORT*  Clinical Data: Larey Seat and injured left hand.  LEFT HAND - COMPLETE 3+ VIEW  Comparison: None.  Findings: Severe generalized osseous demineralization.  Joint space narrowing with associated hypertrophic changes involving all of the joints of the wrist, with associated soft tissue swelling.  Joint space narrowing involving the IP joints of all of the fingers.  No evidence of an acute fracture or dislocation.   IMPRESSION: No acute osseous abnormality.  Severe osseous demineralization. Severe osteoarthritis involving the wrist.  Moderate osteoarthritis involving the IP joints of the hand.   Original Report Authenticated By: Hulan Saas, M.D.    Ct Maxillofacial Wo Cm  12/25/2012  *RADIOLOGY REPORT*  Clinical Data:  Fall, head injury  CT HEAD WITHOUT CONTRAST CT MAXILLOFACIAL WITHOUT CONTRAST CT CERVICAL SPINE WITHOUT CONTRAST  Technique:  Multidetector CT imaging of the head, cervical spine, and maxillofacial structures were performed using the standard protocol without intravenous contrast. Multiplanar CT image reconstructions of the cervical spine and maxillofacial structures were also generated.  Comparison:  None.  CT HEAD  Findings: No evidence of parenchymal hemorrhage or extra-axial fluid collection. No mass lesion, mass effect, or midline shift.  No CT evidence of acute infarction.  Subcortical white matter and periventricular small vessel ischemic changes.  Intracranial atherosclerosis.  Global cortical atrophy.  No ventriculomegaly.  Mild mucosal thickening in the bilateral ethmoid sinuses. Mastoid surgery bilaterally.  No evidence of calvarial fracture.  IMPRESSION: No evidence of acute intracranial abnormality.  Atrophy with small vessel ischemic changes and intracranial atherosclerosis.  CT MAXILLOFACIAL  Findings:  No evidence of maxillofacial fracture.  Mild mucosal thickening in the bilateral ethmoid sinuses. Chronic changes involving the left maxillary sinus.  Prior bilateral mastoid surgery.  The bilateral orbits, including the globes and retroconal soft tissues, are within normal limits.  IMPRESSION: No evidence of maxillofacial fracture.  CT CERVICAL SPINE  Findings:   Exaggerated cervical lordosis.  Type 2 dens fracture (series 900/image 25).  Anterior inferior corner fracture at C5 (series 900/image 26), displaced.  Moderate multilevel degenerative changes.  No prevertebral soft tissue  swelling.  Visualized thyroid is heterogeneous/nodular.  Vascular calcifications.  Visualized lung apices are clear.  IMPRESSION: Type 2 dens fracture.  Displaced anterior inferior corner fracture at C5.  Critical Value/emergent results were called by telephone at the time of interpretation on 12/25/2012 at 1425 hours to Dr. Donnetta Hutching, who verbally acknowledged these results.   Original Report Authenticated By: Charline Bills, M.D.     Medications: Scheduled Meds: . aspirin EC  81 mg Oral Daily  . calcium carbonate  1 tablet Oral TID WC  . enoxaparin (LOVENOX) injection  40 mg Subcutaneous Q24H  . metoprolol  50 mg Oral BID  . multivitamin with minerals  1 tablet Oral Daily  . [START ON 12/27/2012] pneumococcal 23 valent vaccine  0.5 mL Intramuscular Tomorrow-1000  . pramipexole  0.25 mg Oral QHS  . simvastatin  10 mg Oral q1800      LOS: 1 day   Antoinette Borgwardt M.D. [facility] 12/26/2012, 10:23 AM Pager: [phone]  If 7PM-7AM, please contact night-coverage [email or web address] Password [email or web address]

## 2012-12-26 NOTE — Progress Notes (Signed)
Spoke with Dr RAI who said patient is now clear to get OOB and work with therapy  Minor, Yvette Rack

## 2012-12-26 NOTE — Progress Notes (Signed)
Occupational Therapy Evaluation Patient Details Name: [name] MRN: [id number] DOB: [birth year] Today's Date: 12/26/2012 Time: 4098-1191 OT Time Calculation (min): 26 min  OT Assessment / Plan / Recommendation Clinical Impression  79 yo who fell OOB and suffered a cervical fx @ C6-7. Pt did not come to the hospital immediately, but symptoms worsended. 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 Dr Danielle Dess requiring decompression. PTA, pt lived independently and states she has had several falls recently. Pt presents with BUE weakness, L>R, with sensory deficits and ataxia. Pt will benefit from skilled OT services to facilitate D/C to next venue due to below deficits. Rec SNF for rehab as pt has limited caregiver support after D/C.     OT Assessment  Patient needs continued OT Services    Follow Up Recommendations  SNF    Barriers to Discharge Decreased caregiver support    Equipment Recommendations  3 in 1 bedside comode;Tub/shower seat    Recommendations for Other Services  Sw consult for placement  Frequency  Min 3X/week    Precautions / Restrictions Precautions Precautions: Cervical Precaution Comments: log rolling. Required Braces or Orthoses: Cervical Brace Cervical Brace: Soft collar;Other (comment) (at all times)   Pertinent Vitals/Pain no apparent distress     ADL  Eating/Feeding: Moderate assistance Where Assessed - Eating/Feeding: Chair Grooming: Moderate assistance Where Assessed - Grooming: Supported sitting Upper Body Bathing: Maximal assistance Where Assessed - Upper Body Bathing: Supported sitting Lower Body Bathing: +1 Total assistance Where Assessed - Lower Body Bathing: Supported sit to stand Upper Body Dressing: Maximal assistance Where Assessed - Upper Body Dressing: Supported sitting Lower Body Dressing: +1 Total assistance Where Assessed - Lower Body Dressing: Supported sit to Pharmacist, hospital: +2 Total assistance Toilet  Transfer: Patient Percentage: 60% Statistician Method: Surveyor, minerals: Other (comment) (simulated) Toileting - Clothing Manipulation and Hygiene: +1 Total assistance Where Assessed - Toileting Clothing Manipulation and Hygiene: Sit to stand from 3-in-1 or toilet Equipment Used: Gait belt Transfers/Ambulation Related to ADLs: +2 Mode A ADL Comments: decrease in funcitonal status. Will benefit from tubing and AE    OT Diagnosis: Generalized weakness;Acute pain;Paresis;Ataxia  OT Problem List: Decreased strength;Decreased range of motion;Impaired balance (sitting and/or standing);Decreased activity tolerance;Decreased coordination;Decreased knowledge of use of DME or AE;Decreased knowledge of precautions;Impaired sensation;Impaired tone;Impaired UE functional use;Pain OT Treatment Interventions: Self-care/ADL training;Therapeutic exercise;Neuromuscular education;Energy conservation;DME and/or AE instruction;Therapeutic activities;Patient/family education;Balance training   OT Goals Acute Rehab OT Goals OT Goal Formulation: With patient Time For Goal Achievement: 01/12/13 Potential to Achieve Goals: Good ADL Goals Pt Will Perform Eating: with set-up;with supervision;Sitting, chair;Supported;with assist to don/doff brace/orthosis;with adaptive utensils;with cueing (comment type and amount) ADL Goal: Eating - Progress: Goal set today Pt Will Perform Grooming: with min assist;with caregiver independent in assisting;Sitting, chair;Supported;with adaptive equipment;with cueing (comment type and amount) ADL Goal: Grooming - Progress: Goal set today Pt Will Perform Upper Body Bathing: with min assist;Sitting, chair;Supported;with adaptive equipment ADL Goal: Upper Body Bathing - Progress: Goal set today Pt Will Transfer to Toilet: with min assist;Squat pivot transfer;with DME;3-in-1;Maintaining back safety precautions ADL Goal: Toilet Transfer - Progress: Goal set today Pt  Will Perform Toileting - Hygiene: with min assist;Leaning right and/or left on 3-in-1/toilet ADL Goal: Toileting - Hygiene - Progress: Goal set today Additional ADL Goal #1: Pt will maintain sitting balance EOB x 10 mini with SBA in preparation for ADL retarining ADL Goal: Additional Goal #1 - Progress: Goal set today Arm Goals  Pt Will Complete Theraputty Exer: with supervision, verbal cues required/provided;to increase strength;Bilateral upper extremities;Min resistance putty Arm Goal: Theraputty Exercises - Progress: Goal set today Additional Arm Goal #1: Pt/family will complete BUE strengthening HEP to increase funcitonal use B hands. Arm Goal: Additional Goal #1 - Progress: Goal set today  Visit Information  Last OT Received On: 12/26/12    Subjective Data      Prior Functioning     Home Living Lives With: Alone Available Help at Discharge: Available PRN/intermittently (supervision level. not able to physically assist) Type of Home: House Home Access: Stairs to enter [employer] of Steps: 3 Entrance Stairs-Rails: Left Home Layout: One level Bathroom Shower/Tub: Walk-in shower;Door [employer] Toilet: Standard Bathroom Accessibility: Yes How Accessible: Accessible via walker Home Adaptive Equipment: Walker - rolling;Straight cane;Shower chair without back Prior Function Level of Independence: Independent Able to Take Stairs?: Yes Driving: No Communication Communication: No difficulties Dominant Hand: Right         Vision/Perception Vision - History Baseline Vision: Wears glasses all the time Patient Visual Report: Blurring of vision;Other (comment) (R eye) Vision - Assessment Eye Alignment: Within Functional Limits   Cognition  Cognition Overall Cognitive Status: Appears within functional limits for tasks assessed/performed Arousal/Alertness: Awake/alert Orientation Level: Appears intact for tasks assessed Behavior During Session: [facility] for tasks  performed    Extremity/Trunk Assessment Right Upper Extremity Assessment RUE ROM/Strength/Tone: Deficits RUE ROM/Strength/Tone Deficits: AROM WFL proximally. Deficits with in hand manipulation . strength forarm/wrist 3+/5. Hand @ 3/7. Poor coordination RUE Sensation: Deficits RUE Sensation Deficits: diminished light touch and propiroception. R hand RUE Coordination: Deficits RUE Coordination Deficits: ataxic Left Upper Extremity Assessment LUE ROM/Strength/Tone: Deficits LUE ROM/Strength/Tone Deficits: LUE more afrected than R. Proaximal strength @ 4+/5. decreasedAROM in C5-8 distribution. Able to make gross grasp. Strength @ 2+/5. extensor lag, unable to extend 3 - 5 digits. Unable to make functional pinch. LUE Sensation: Deficits LUE Sensation Deficits: decreased. greater sensory deficit L hand LUE Coordination: Deficits LUE Coordination Deficits: ataxic movments Right Lower Extremity Assessment RLE ROM/Strength/Tone: Deficits RLE Sensation: Deficits Left Lower Extremity Assessment LLE ROM/Strength/Tone: Deficits LLE Sensation: Deficits Trunk Assessment Trunk Assessment: Kyphotic;Other exceptions (poor trunk control)     Mobility Bed Mobility Bed Mobility: Rolling Left;Left Sidelying to Sit Rolling Left: 3: Mod assist Left Sidelying to Sit: 3: Mod assist Details for Bed Mobility Assistance: mod A to scoot Transfers Transfers: Sit to Stand;Stand to Sit Sit to Stand: 1: +2 Total assist;From bed Sit to Stand: Patient Percentage: 60% Stand to Sit: 1: +2 Total assist;With upper extremity assist;To chair/3-in-1 Stand to Sit: Patient Percentage: 60% Details for Transfer Assistance: +2 using gait belt with support to BUE at shoulder/elbow. Stand step to chair     Exercise     Balance Balance Balance Assessed: Yes Static Sitting Balance Static Sitting - Balance Support: Bilateral upper extremity supported;Feet supported Static Sitting - Level of Assistance: 3: Mod  assist Static Sitting - Comment/# of Minutes: 10   End of Session OT - End of Session Equipment Utilized During Treatment: Gait belt Activity Tolerance: Patient tolerated treatment well Patient left: in chair;with call bell/phone within reach;with family/visitor present Nurse Communication: Mobility status;Precautions;Other (comment) (transfer technique)  GO     Clydie Dillen,HILLARY 12/26/2012, 3:20 PM [facility], OTR/L  [phone] 12/26/2012

## 2012-12-27 DIAGNOSIS — M549 Dorsalgia, unspecified: Secondary | ICD-10-CM

## 2012-12-27 DIAGNOSIS — G8929 Other chronic pain: Secondary | ICD-10-CM

## 2012-12-27 DIAGNOSIS — S7000XA Contusion of unspecified hip, initial encounter: Secondary | ICD-10-CM

## 2012-12-27 MED ORDER — POLYETHYLENE GLYCOL 3350 17 G PO PACK
17.0000 g | PACK | Freq: Every day | ORAL | Status: DC
  Administered 2012-12-27 – 2013-01-01 (×5): 17 g via ORAL
  Filled 2012-12-27 (×8): qty 1

## 2012-12-27 NOTE — Progress Notes (Signed)
Physical Therapy Treatment Patient Details Name: [name] MRN: [id number] DOB: 1931/02/26 Today's Date: 12/27/2012 Time: 4098-1191 PT Time Calculation (min): 17 min  PT Assessment / Plan / Recommendation Comments on Treatment Session  Pt is slowly progressing. Pt requires 2+ A for transfers and amb. Demo unsteady gt; with decreased ability to use RW secondary to pain in LUE. Pt is agreeable to STSNF to improve indpendence and functional mobility. Will cont to f/u with pt to maximize functional mobility in the acute setting.     Follow Up Recommendations  SNF;Supervision/Assistance - 24 hour     Does the patient have the potential to tolerate intense rehabilitation     Barriers to Discharge        Equipment Recommendations  None recommended by PT    Recommendations for Other Services    Frequency Min 3X/week   Plan Discharge plan remains appropriate;Frequency remains appropriate    Precautions / Restrictions Precautions Precautions: Cervical;Fall Precaution Comments: unable to recall cervical precautions Required Braces or Orthoses: Cervical Brace Cervical Brace: Soft collar Restrictions Weight Bearing Restrictions: No   Pertinent Vitals/Pain Reports pain in L shoulder as "throbbing" states it "come and goes"; pt repositioned in chair. Did not rate pain on scale of 0-10.    Mobility  Bed Mobility Bed Mobility: Rolling Left;Left Sidelying to Sit;Sitting - Scoot to Edge of Bed Rolling Left: 3: Mod assist;With rail Left Sidelying to Sit: 3: Mod assist;With rails Sitting - Scoot to Edge of Bed: 3: Mod assist Details for Bed Mobility Assistance: multimodal cues for hand placement and sequencing to adhere to cervical precautions; pt c/o pain in L shoulder with movement. c/o lightheadedness with sitting EOB; pt stated it subsided in ~1 min Transfers Transfers: Sit to Stand;Stand to Sit Sit to Stand: 1: +2 Total assist;With upper extremity assist;From bed Sit to Stand:  Patient Percentage: 50% Stand to Sit: 1: +2 Total assist;To chair/3-in-1;With armrests;With upper extremity assist Stand to Sit: Patient Percentage: 50% Details for Transfer Assistance: Pt required 2+ assist for sit <> stand transfers; pt demo posterior/lateral lean to R; unable to weightshift equally. Required multimodal cues to stand in upright position; has tendency to flex at hips  Ambulation/Gait Ambulation/Gait Assistance: 1: +2 Total assist Ambulation/Gait: Patient Percentage: 30% Ambulation Distance (Feet): 6 Feet Assistive device: 2 person hand held assist Ambulation/Gait Assistance Details: constant cues to stand upright; pt amb with lateral lean to R and posteriorly; very narrow BOS; unable to indpendently correct with cues. Did not attempt amb with RW secondary to pt c/o pain in L shoulder and decreased ROM in L shoulder, pt is very unsteady and demo balance deficits  Gait Pattern: Decreased stride length;Trunk flexed;Narrow base of support;Shuffle;Lateral trunk lean to right Gait velocity: decreased General Gait Details: pt demo high anxiety and fear of falling with amb and transfers  Stairs: No Wheelchair Mobility Wheelchair Mobility: No    Exercises     PT Diagnosis:    PT Problem List:   PT Treatment Interventions:     PT Goals Acute Rehab PT Goals PT Goal Formulation: With patient Time For Goal Achievement: 01/09/13 Potential to Achieve Goals: Good PT Goal: Supine/Side to Sit - Progress: Progressing toward goal PT Goal: Sit to Stand - Progress: Progressing toward goal PT Goal: Stand to Sit - Progress: Progressing toward goal PT Transfer Goal: Bed to Chair/Chair to Bed - Progress: Progressing toward goal PT Goal: Ambulate - Progress: Progressing toward goal  Visit Information  Last PT Received On: 12/27/12  Assistance Needed: +2    Subjective Data  Subjective: " I sat up for 2 hours yesterday, I would like to do that again. " Patient Stated Goal: to be  indpendent    Cognition  Cognition Overall Cognitive Status: Appears within functional limits for tasks assessed/performed Arousal/Alertness: Awake/alert Orientation Level: Appears intact for tasks assessed Behavior During Session: [facility] for tasks performed    Balance  Balance Balance Assessed: No  End of Session PT - End of Session Equipment Utilized During Treatment: Gait belt;Cervical collar Activity Tolerance: Patient tolerated treatment well Patient left: in chair;with call bell/phone within reach Nurse Communication: Mobility status;Other (comment) (pt request for bath )   GP     Donell Sievert, [place] [phone] 12/27/2012, 10:34 AM

## 2012-12-27 NOTE — Progress Notes (Signed)
Patient ID: [name]  female  XBJ:[id number]    DOB: [birth year]    DOA: 12/25/2012  PCP: [name], MD  Assessment/Plan: Principal Problem:   Cervical spine fracture with fall from the bed - Continue c-collar,  MRI C-spine shows fracture C5, neurosurgery consulted, will follow recommendations  - Family now considering neurosurgery, RN to notify Dr Danielle Dess  Active Problems:  Rhabdomyolysis: Likely due to fall, gentle hydration   Nasal swelling: No fracture on the x-ray    Hypertension with history of diastolic CHF: Currently stable    Osteoporosis with history of Spinal stenosis of lumbar region chronic back pain - Patient has been living at home, ambulates with a walker but fell out of bed resulting in cervical spine fracture - Continue pain control    Hip pain- LEFT - CT of the hip done - no fractures    AKI (acute kidney injury)- improved with IV fluid hydration    Hyponatremia- resolved  DVT Prophylaxis: Lovenox  Code Status: Full CODE STATUS  Disposition: Pending neurosurgery    Subjective:  alert and oriented, patient seen twice today, no specific complaints, family at bedside requesting for neurosurgery  Objective: Weight change:   Intake/Output Summary (Last 24 hours) at 12/27/12 1633 Last data filed at 12/27/12 0144  Gross per 24 hour  Intake      0 ml  Output   1800 ml  Net  -1800 ml   Blood pressure 171/65, pulse 71, temperature 98.4 F (36.9 C), temperature source Oral, resp. rate 18, height 5\' 4"  (1.626 m), weight 80.2 kg (176 lb 12.9 oz), SpO2 95.00%.  Physical Exam: General: Ax O x 3 not in any acute distress. C collar HEENT: ecchymosis around the right eye, swelling of the nasal bone improving CVS: S1-S2 clear,  Chest: CTAB Abdomen: soft NT, ND, NBS Extremities: no c/c/e bilaterally Neuro: Moving all 4 extremities   Lab Results: Basic Metabolic Panel:  Recent Labs Lab 12/25/12 1221 12/26/12 0700  NA 130* 137  K 4.3  3.9  CL 94* 100  CO2 27 26  GLUCOSE 93 84  BUN 50* 40*  CREATININE 1.56* 1.22*  CALCIUM 9.5 9.6   Liver Function Tests: No results found for this basename: AST, ALT, ALKPHOS, BILITOT, PROT, ALBUMIN,  in the last 168 hours No results found for this basename: LIPASE, AMYLASE,  in the last 168 hours No results found for this basename: AMMONIA,  in the last 168 hours CBC:  Recent Labs Lab 12/25/12 1221 12/26/12 0700  WBC 7.5 7.9  NEUTROABS 5.2  --   HGB 10.4* 10.8*  HCT 30.2* 31.9*  MCV 91.0 91.1  PLT 208 226   Cardiac Enzymes:  Recent Labs Lab 12/25/12 1524 12/25/12 1820  CKTOTAL  --  2344*  TROPONINI <0.30  --    BNP: No components found with this basename: POCBNP,  CBG: No results found for this basename: GLUCAP,  in the last 168 hours   Micro Results: No results found for this or any previous visit (from the past 240 hour(s)).  Studies/Results: Dg Hip Complete Left  12/25/2012  *RADIOLOGY REPORT*  Clinical Data: Fall and left hip pain.  LEFT HIP - COMPLETE 2+ VIEW  Comparison: Lumbar spine 03/07/2008  Findings: AP view of the pelvis and two views of the left hip were obtained.  The pelvic bony ring is intact.  There is mild joint space narrowing in both hips. There appears to osteophytes near the left femoral head and neck  junction.  Difficult to exclude a subtle injury or fracture at this location.  Otherwise, there is no fracture or dislocation.  Symmetric appearance of the sacroiliac joints.  IMPRESSION: No definite fracture or dislocation involving pelvis or left hip. There is mild irregularity at the junction of the left femoral head and neck which could be related to osteophytes.  If there is high clinical concern for an acute fracture, recommend further evaluation with CT or MRI.   Original Report Authenticated By: Richarda Overlie, M.D.    Ct Head Wo Contrast  12/25/2012  *RADIOLOGY REPORT*  Clinical Data:  Fall, head injury  CT HEAD WITHOUT CONTRAST CT MAXILLOFACIAL  WITHOUT CONTRAST CT CERVICAL SPINE WITHOUT CONTRAST  Technique:  Multidetector CT imaging of the head, cervical spine, and maxillofacial structures were performed using the standard protocol without intravenous contrast. Multiplanar CT image reconstructions of the cervical spine and maxillofacial structures were also generated.  Comparison:  None.  CT HEAD  Findings: No evidence of parenchymal hemorrhage or extra-axial fluid collection. No mass lesion, mass effect, or midline shift.  No CT evidence of acute infarction.  Subcortical white matter and periventricular small vessel ischemic changes.  Intracranial atherosclerosis.  Global cortical atrophy.  No ventriculomegaly.  Mild mucosal thickening in the bilateral ethmoid sinuses. Mastoid surgery bilaterally.  No evidence of calvarial fracture.  IMPRESSION: No evidence of acute intracranial abnormality.  Atrophy with small vessel ischemic changes and intracranial atherosclerosis.  CT MAXILLOFACIAL  Findings:  No evidence of maxillofacial fracture.  Mild mucosal thickening in the bilateral ethmoid sinuses. Chronic changes involving the left maxillary sinus.  Prior bilateral mastoid surgery.  The bilateral orbits, including the globes and retroconal soft tissues, are within normal limits.  IMPRESSION: No evidence of maxillofacial fracture.  CT CERVICAL SPINE  Findings:   Exaggerated cervical lordosis.  Type 2 dens fracture (series 900/image 25).  Anterior inferior corner fracture at C5 (series 900/image 26), displaced.  Moderate multilevel degenerative changes.  No prevertebral soft tissue swelling.  Visualized thyroid is heterogeneous/nodular.  Vascular calcifications.  Visualized lung apices are clear.  IMPRESSION: Type 2 dens fracture.  Displaced anterior inferior corner fracture at C5.  Critical Value/emergent results were called by telephone at the time of interpretation on 12/25/2012 at 1425 hours to Dr. Donnetta Hutching, who verbally acknowledged these results.    Original Report Authenticated By: Charline Bills, M.D.    Ct Cervical Spine Wo Contrast  12/25/2012  *RADIOLOGY REPORT*  Clinical Data:  Fall, head injury  CT HEAD WITHOUT CONTRAST CT MAXILLOFACIAL WITHOUT CONTRAST CT CERVICAL SPINE WITHOUT CONTRAST  Technique:  Multidetector CT imaging of the head, cervical spine, and maxillofacial structures were performed using the standard protocol without intravenous contrast. Multiplanar CT image reconstructions of the cervical spine and maxillofacial structures were also generated.  Comparison:  None.  CT HEAD  Findings: No evidence of parenchymal hemorrhage or extra-axial fluid collection. No mass lesion, mass effect, or midline shift.  No CT evidence of acute infarction.  Subcortical white matter and periventricular small vessel ischemic changes.  Intracranial atherosclerosis.  Global cortical atrophy.  No ventriculomegaly.  Mild mucosal thickening in the bilateral ethmoid sinuses. Mastoid surgery bilaterally.  No evidence of calvarial fracture.  IMPRESSION: No evidence of acute intracranial abnormality.  Atrophy with small vessel ischemic changes and intracranial atherosclerosis.  CT MAXILLOFACIAL  Findings:  No evidence of maxillofacial fracture.  Mild mucosal thickening in the bilateral ethmoid sinuses. Chronic changes involving the left maxillary sinus.  Prior bilateral mastoid surgery.  The bilateral orbits, including the globes and retroconal soft tissues, are within normal limits.  IMPRESSION: No evidence of maxillofacial fracture.  CT CERVICAL SPINE  Findings:   Exaggerated cervical lordosis.  Type 2 dens fracture (series 900/image 25).  Anterior inferior corner fracture at C5 (series 900/image 26), displaced.  Moderate multilevel degenerative changes.  No prevertebral soft tissue swelling.  Visualized thyroid is heterogeneous/nodular.  Vascular calcifications.  Visualized lung apices are clear.  IMPRESSION: Type 2 dens fracture.  Displaced anterior inferior  corner fracture at C5.  Critical Value/emergent results were called by telephone at the time of interpretation on 12/25/2012 at 1425 hours to Dr. Donnetta Hutching, who verbally acknowledged these results.   Original Report Authenticated By: Charline Bills, M.D.    Mr Cervical Spine W Wo Contrast  12/25/2012  *RADIOLOGY REPORT*  Clinical Data: Quadrant parapsoas.  Cervical spine fractures after a fall today.  MRI CERVICAL SPINE WITHOUT CONTRAST  Technique:  Multiplanar and multiecho pulse sequences of the cervical spine, to include the craniocervical junction and cervicothoracic junction, were obtained according to standard protocol without intravenous contrast.  Comparison: CT cervical spine 12/25/2012.  Findings: The patient is severely claustrophobic.  Efforts were made to [facility] anxiety with medication and positioning.  Images are suboptimal and best obtainable.  Type 2 odontoid fracture without displacement.  Mild pannus surrounds the odontoid.  There is no craniocervical junction narrowing.  No evidence for vertebral artery injury.  Anterior inferior osteophytic fracture of C5 extends to the disc space,  without significant displacement/subluxation. No post traumatic disc herniation at C5-6. Slight prevertebral soft tissue swelling.  Severe disc space narrowing at C4-5, in combination with facet arthropathy and ligamentum flavum calcification, results in severe spinal stenosis measuring 3 mm left and 4 mm right.  Slightly caudad to C4-5, the cord is mildly hyperintense dorsally (image 3 series 4, image 14 series 8).  There is no intraspinal hematoma or hematomyelia, and this cord hyperintensity may represent chronic myelomalacia although central cord syndrome with edema not excluded.  Due to severe compression and lack of signal from the most severely narrowed segment, I am unable to assess for gliosis at C4-5.  Sagittal gradient sequence for trauma is suboptimal, but I am fairly confident there is no  hematomyelia or intraspinal hematoma.  Moderate to severe stenosis is present at C3-4 with posterior osteophytic spurring.  Canal diameter at this level is improved over that at C4-5, measuring 6 mm AP diameter.  Mild spondylosis at C6-7 and C7-T1.  Facet mediated slip of 3 mm at T1-2 is non compressive.  IMPRESSION: Type 2 odontoid fracture and anterior inferior endplate C5 fracture as described above, without associated intraspinal hemorrhage or hematomyelia.  Severe multifactorial spondylosis C4-5 with critical cord compression. Canal diameter 3-4 mm.  Slight cord hyperintensity just caudad to the severe stenosis not clearly post traumatic, and may represent chronic myelomalacia versus acute central cord syndrome.  Findings discussed with Dr. Danielle Dess.   Original Report Authenticated By: Davonna Belling, M.D.    Dg Shoulder Left  12/25/2012  *RADIOLOGY REPORT*  Clinical Data: Fall.  LEFT SHOULDER - 2+ VIEW  Comparison: Chest radiograph 03/19/2008  Findings: Three views of the left shoulder are negative for a fracture or dislocation.  The ribs appear to be intact.  No definite pneumothorax. Degenerative changes in the lower cervical spine.  IMPRESSION: No acute bony abnormality in the left shoulder.   Original Report Authenticated By: Richarda Overlie, M.D.    Dg Hand Complete Left  12/25/2012  *RADIOLOGY REPORT*  Clinical Data: Larey Seat and injured left hand.  LEFT HAND - COMPLETE 3+ VIEW  Comparison: None.  Findings: Severe generalized osseous demineralization.  Joint space narrowing with associated hypertrophic changes involving all of the joints of the wrist, with associated soft tissue swelling.  Joint space narrowing involving the IP joints of all of the fingers.  No evidence of an acute fracture or dislocation.  IMPRESSION: No acute osseous abnormality.  Severe osseous demineralization. Severe osteoarthritis involving the wrist.  Moderate osteoarthritis involving the IP joints of the hand.   Original Report  Authenticated By: Hulan Saas, M.D.    Ct Maxillofacial Wo Cm  12/25/2012  *RADIOLOGY REPORT*  Clinical Data:  Fall, head injury  CT HEAD WITHOUT CONTRAST CT MAXILLOFACIAL WITHOUT CONTRAST CT CERVICAL SPINE WITHOUT CONTRAST  Technique:  Multidetector CT imaging of the head, cervical spine, and maxillofacial structures were performed using the standard protocol without intravenous contrast. Multiplanar CT image reconstructions of the cervical spine and maxillofacial structures were also generated.  Comparison:  None.  CT HEAD  Findings: No evidence of parenchymal hemorrhage or extra-axial fluid collection. No mass lesion, mass effect, or midline shift.  No CT evidence of acute infarction.  Subcortical white matter and periventricular small vessel ischemic changes.  Intracranial atherosclerosis.  Global cortical atrophy.  No ventriculomegaly.  Mild mucosal thickening in the bilateral ethmoid sinuses. Mastoid surgery bilaterally.  No evidence of calvarial fracture.  IMPRESSION: No evidence of acute intracranial abnormality.  Atrophy with small vessel ischemic changes and intracranial atherosclerosis.  CT MAXILLOFACIAL  Findings:  No evidence of maxillofacial fracture.  Mild mucosal thickening in the bilateral ethmoid sinuses. Chronic changes involving the left maxillary sinus.  Prior bilateral mastoid surgery.  The bilateral orbits, including the globes and retroconal soft tissues, are within normal limits.  IMPRESSION: No evidence of maxillofacial fracture.  CT CERVICAL SPINE  Findings:   Exaggerated cervical lordosis.  Type 2 dens fracture (series 900/image 25).  Anterior inferior corner fracture at C5 (series 900/image 26), displaced.  Moderate multilevel degenerative changes.  No prevertebral soft tissue swelling.  Visualized thyroid is heterogeneous/nodular.  Vascular calcifications.  Visualized lung apices are clear.  IMPRESSION: Type 2 dens fracture.  Displaced anterior inferior corner fracture at C5.   Critical Value/emergent results were called by telephone at the time of interpretation on 12/25/2012 at 1425 hours to Dr. Donnetta Hutching, who verbally acknowledged these results.   Original Report Authenticated By: Charline Bills, M.D.     Medications: Scheduled Meds: . aspirin EC  81 mg Oral Daily  . calcium carbonate  1 tablet Oral TID WC  . dexamethasone  4 mg Intravenous Q6H  . enoxaparin (LOVENOX) injection  40 mg Subcutaneous Q24H  . metoprolol  50 mg Oral BID  . multivitamin with minerals  1 tablet Oral Daily  . pneumococcal 23 valent vaccine  0.5 mL Intramuscular Tomorrow-1000  . polyethylene glycol  17 g Oral Daily  . pramipexole  0.25 mg Oral QHS  . simvastatin  10 mg Oral q1800      LOS: 2 days   Juaquina Machnik M.D. [facility] 12/27/2012, 4:33 PM Pager: [phone]  If 7PM-7AM, please contact night-coverage [email or web address] Password [email or web address]

## 2012-12-27 NOTE — Progress Notes (Signed)
Patient ID: [name], female   DOB: December 23, 1930, 79 y.o.   MRN: [id number] Session today with patient and her daughters who are present with her, regarding surgery. Patient notes that she is willing to take a chance of being aware of the risk that there could be worsening after surgery to undergo surgical decompression and stabilization in an effort to gain some potential return of function. At this point she has great difficulty trying to feed herself and really requires help of another individual. I explained that the surgery would involve a laminectomy from C3-C6 with posterior fixation using facet screws from C3-C6 and placement of bone graft from C3-C6. Plain surgery at the earliest convenience which would be likely tomorrow all write the orders and make her n.p.o.

## 2012-12-28 ENCOUNTER — Inpatient Hospital Stay (HOSPITAL_COMMUNITY): Payer: Medicare Other

## 2012-12-28 ENCOUNTER — Encounter (HOSPITAL_COMMUNITY): Payer: Self-pay | Admitting: Anesthesiology

## 2012-12-28 ENCOUNTER — Inpatient Hospital Stay (HOSPITAL_COMMUNITY): Payer: Medicare Other | Admitting: Anesthesiology

## 2012-12-28 ENCOUNTER — Encounter (HOSPITAL_COMMUNITY)
Admission: EM | Disposition: A | Discharge status: Skilled Nursing Facility | Payer: Self-pay | Source: Home / Self Care | Attending: Internal Medicine

## 2012-12-28 ENCOUNTER — Encounter (HOSPITAL_COMMUNITY): Payer: Self-pay | Admitting: Certified Registered Nurse Anesthetist

## 2012-12-28 HISTORY — PX: POSTERIOR CERVICAL FUSION/FORAMINOTOMY: SHX5038

## 2012-12-28 LAB — CBC
MCH: 31.3 pg (ref 26.0–34.0)
MCHC: 34.6 g/dL (ref 30.0–36.0)
RDW: 13.5 % (ref 11.5–15.5)

## 2012-12-28 LAB — BASIC METABOLIC PANEL
BUN: 43 mg/dL — ABNORMAL HIGH (ref 6–23)
Calcium: 9.9 mg/dL (ref 8.4–10.5)
Creatinine, Ser: 1.05 mg/dL (ref 0.50–1.10)
GFR calc Af Amer: 57 mL/min — ABNORMAL LOW (ref >90)
GFR calc non Af Amer: 49 mL/min — ABNORMAL LOW (ref >90)
Glucose, Bld: 134 mg/dL — ABNORMAL HIGH (ref 70–99)

## 2012-12-28 LAB — CK: Total CK: 2989 U/L — ABNORMAL HIGH (ref 7–177)

## 2012-12-28 LAB — MRSA PCR SCREENING: MRSA by PCR: NEGATIVE

## 2012-12-28 SURGERY — POSTERIOR CERVICAL FUSION/FORAMINOTOMY LEVEL 3
Anesthesia: General | Site: Neck | Wound class: Clean

## 2012-12-28 MED ORDER — METHOCARBAMOL 100 MG/ML IJ SOLN
500.0000 mg | Freq: Four times a day (QID) | INTRAVENOUS | Status: DC | PRN
  Filled 2012-12-28: qty 5

## 2012-12-28 MED ORDER — ACETAMINOPHEN 325 MG PO TABS: 650.0000 mg | ORAL_TABLET | ORAL | Status: DC | PRN

## 2012-12-28 MED ORDER — OXYCODONE HCL 5 MG PO TABS: 5.0000 mg | ORAL_TABLET | Freq: Once | ORAL | Status: DC | PRN

## 2012-12-28 MED ORDER — POLYETHYLENE GLYCOL 3350 17 G PO PACK
17.0000 g | PACK | Freq: Every day | ORAL | Status: DC | PRN
  Filled 2012-12-28: qty 1

## 2012-12-28 MED ORDER — NEOSTIGMINE METHYLSULFATE 1 MG/ML IJ SOLN
INTRAMUSCULAR | Status: DC | PRN
  Administered 2012-12-28: 3.5 mg via INTRAVENOUS

## 2012-12-28 MED ORDER — LIDOCAINE-EPINEPHRINE 1 %-1:100000 IJ SOLN
INTRAMUSCULAR | Status: DC | PRN
  Administered 2012-12-28: 10 mL

## 2012-12-28 MED ORDER — BACITRACIN ZINC 500 UNIT/GM EX OINT
TOPICAL_OINTMENT | CUTANEOUS | Status: DC | PRN
  Administered 2012-12-28: 1 via TOPICAL

## 2012-12-28 MED ORDER — FENTANYL CITRATE 0.05 MG/ML IJ SOLN
25.0000 ug | INTRAMUSCULAR | Status: DC | PRN
  Administered 2012-12-28: 25 ug via INTRAVENOUS

## 2012-12-28 MED ORDER — FENTANYL CITRATE 0.05 MG/ML IJ SOLN
INTRAMUSCULAR | Status: AC
  Filled 2012-12-28: qty 2

## 2012-12-28 MED ORDER — MORPHINE SULFATE 2 MG/ML IJ SOLN
1.0000 mg | INTRAMUSCULAR | Status: DC | PRN
  Administered 2012-12-29 – 2012-12-31 (×7): 2 mg via INTRAVENOUS
  Administered 2012-12-31: 4 mg via INTRAVENOUS
  Administered 2012-12-31: 2 mg via INTRAVENOUS
  Administered 2013-01-01: 4 mg via INTRAVENOUS
  Filled 2012-12-28 (×5): qty 1
  Filled 2012-12-28: qty 2
  Filled 2012-12-28 (×2): qty 1
  Filled 2012-12-28: qty 2
  Filled 2012-12-28: qty 1

## 2012-12-28 MED ORDER — SODIUM CHLORIDE 0.9 % IJ SOLN: 3.0000 mL | INTRAMUSCULAR | Status: DC | PRN

## 2012-12-28 MED ORDER — KETOROLAC TROMETHAMINE 15 MG/ML IJ SOLN
15.0000 mg | Freq: Three times a day (TID) | INTRAMUSCULAR | Status: DC | PRN
  Filled 2012-12-28: qty 1

## 2012-12-28 MED ORDER — SENNA 8.6 MG PO TABS
1.0000 | ORAL_TABLET | Freq: Two times a day (BID) | ORAL | Status: DC
  Administered 2012-12-29: 8.6 mg via ORAL
  Filled 2012-12-28 (×3): qty 1

## 2012-12-28 MED ORDER — PROMETHAZINE HCL 25 MG/ML IJ SOLN
6.2500 mg | INTRAMUSCULAR | Status: DC | PRN
  Administered 2012-12-28: 6.25 mg via INTRAVENOUS

## 2012-12-28 MED ORDER — PHENYLEPHRINE HCL 10 MG/ML IJ SOLN
INTRAMUSCULAR | Status: DC | PRN
  Administered 2012-12-28 (×5): 40 ug via INTRAVENOUS

## 2012-12-28 MED ORDER — PROMETHAZINE HCL 25 MG/ML IJ SOLN
INTRAMUSCULAR | Status: AC
  Filled 2012-12-28: qty 1

## 2012-12-28 MED ORDER — 0.9 % SODIUM CHLORIDE (POUR BTL) OPTIME
TOPICAL | Status: DC | PRN
  Administered 2012-12-28: 1000 mL

## 2012-12-28 MED ORDER — SODIUM CHLORIDE 0.9 % IV SOLN: INTRAVENOUS | Status: DC

## 2012-12-28 MED ORDER — ROCURONIUM BROMIDE 100 MG/10ML IV SOLN
INTRAVENOUS | Status: DC | PRN
  Administered 2012-12-28: 20 mg via INTRAVENOUS
  Administered 2012-12-28: 40 mg via INTRAVENOUS
  Administered 2012-12-28: 10 mg via INTRAVENOUS
  Administered 2012-12-28: 20 mg via INTRAVENOUS
  Administered 2012-12-28 (×2): 10 mg via INTRAVENOUS

## 2012-12-28 MED ORDER — MENTHOL 3 MG MT LOZG: 1.0000 | LOZENGE | OROMUCOSAL | Status: DC | PRN

## 2012-12-28 MED ORDER — METHOCARBAMOL 500 MG PO TABS
500.0000 mg | ORAL_TABLET | Freq: Four times a day (QID) | ORAL | Status: DC | PRN
  Administered 2012-12-29 – 2013-01-03 (×11): 500 mg via ORAL
  Filled 2012-12-28 (×11): qty 1

## 2012-12-28 MED ORDER — ONDANSETRON HCL 4 MG/2ML IJ SOLN
INTRAMUSCULAR | Status: DC | PRN
  Administered 2012-12-28: 4 mg via INTRAVENOUS

## 2012-12-28 MED ORDER — VANCOMYCIN HCL 1000 MG IV SOLR
1000.0000 mg | Freq: Two times a day (BID) | INTRAVENOUS | Status: DC
  Administered 2012-12-28 – 2012-12-29 (×2): 1000 mg via INTRAVENOUS
  Filled 2012-12-28 (×3): qty 1000

## 2012-12-28 MED ORDER — ONDANSETRON HCL 4 MG/2ML IJ SOLN: 4.0000 mg | INTRAMUSCULAR | Status: DC | PRN

## 2012-12-28 MED ORDER — DEXAMETHASONE SODIUM PHOSPHATE 10 MG/ML IJ SOLN
INTRAMUSCULAR | Status: DC | PRN
  Administered 2012-12-28: 10 mg via INTRAVENOUS

## 2012-12-28 MED ORDER — FLEET ENEMA 7-19 GM/118ML RE ENEM
1.0000 | ENEMA | Freq: Once | RECTAL | Status: AC | PRN
  Filled 2012-12-28: qty 1

## 2012-12-28 MED ORDER — GLYCOPYRROLATE 0.2 MG/ML IJ SOLN
INTRAMUSCULAR | Status: DC | PRN
  Administered 2012-12-28: .6 mg via INTRAVENOUS

## 2012-12-28 MED ORDER — THROMBIN 20000 UNITS EX SOLR
CUTANEOUS | Status: DC | PRN
  Administered 2012-12-28: 17:00:00 via TOPICAL

## 2012-12-28 MED ORDER — EPHEDRINE SULFATE 50 MG/ML IJ SOLN
INTRAMUSCULAR | Status: DC | PRN
  Administered 2012-12-28 (×3): 10 mg via INTRAVENOUS
  Administered 2012-12-28: 5 mg via INTRAVENOUS
  Administered 2012-12-28: 10 mg via INTRAVENOUS

## 2012-12-28 MED ORDER — SODIUM CHLORIDE 0.9 % IV SOLN: 250.0000 mL | INTRAVENOUS | Status: DC

## 2012-12-28 MED ORDER — OXYCODONE-ACETAMINOPHEN 5-325 MG PO TABS: 1.0000 | ORAL_TABLET | ORAL | Status: DC | PRN

## 2012-12-28 MED ORDER — ACETAMINOPHEN 650 MG RE SUPP: 650.0000 mg | RECTAL | Status: DC | PRN

## 2012-12-28 MED ORDER — DOCUSATE SODIUM 100 MG PO CAPS
100.0000 mg | ORAL_CAPSULE | Freq: Two times a day (BID) | ORAL | Status: DC
  Administered 2012-12-29: 100 mg via ORAL
  Filled 2012-12-28 (×3): qty 1

## 2012-12-28 MED ORDER — SODIUM CHLORIDE 0.9 % IR SOLN
Status: DC | PRN
  Administered 2012-12-28: 17:00:00

## 2012-12-28 MED ORDER — SODIUM CHLORIDE 0.9 % IJ SOLN
3.0000 mL | Freq: Two times a day (BID) | INTRAMUSCULAR | Status: DC
  Administered 2012-12-28 – 2012-12-29 (×2): 3 mL via INTRAVENOUS

## 2012-12-28 MED ORDER — LIDOCAINE HCL 4 % MT SOLN
OROMUCOSAL | Status: DC | PRN
  Administered 2012-12-28: 2 mL via TOPICAL

## 2012-12-28 MED ORDER — ALUM & MAG HYDROXIDE-SIMETH 200-200-20 MG/5ML PO SUSP: 30.0000 mL | Freq: Four times a day (QID) | ORAL | Status: DC | PRN

## 2012-12-28 MED ORDER — LACTATED RINGERS IV SOLN
INTRAVENOUS | Status: DC | PRN
  Administered 2012-12-28 (×2): via INTRAVENOUS

## 2012-12-28 MED ORDER — KETOROLAC TROMETHAMINE 30 MG/ML IJ SOLN
INTRAMUSCULAR | Status: AC
  Administered 2012-12-28: 15 mg
  Filled 2012-12-28: qty 1

## 2012-12-28 MED ORDER — PHENOL 1.4 % MT LIQD: 1.0000 | OROMUCOSAL | Status: DC | PRN

## 2012-12-28 MED ORDER — DIAZEPAM 5 MG/ML IJ SOLN
5.0000 mg | Freq: Once | INTRAMUSCULAR | Status: AC
  Administered 2012-12-28: 5 mg via INTRAVENOUS
  Filled 2012-12-28: qty 2

## 2012-12-28 MED ORDER — FENTANYL CITRATE 0.05 MG/ML IJ SOLN
INTRAMUSCULAR | Status: DC | PRN
  Administered 2012-12-28: 50 ug via INTRAVENOUS
  Administered 2012-12-28: 100 ug via INTRAVENOUS
  Administered 2012-12-28 (×2): 50 ug via INTRAVENOUS
  Administered 2012-12-28: 100 ug via INTRAVENOUS

## 2012-12-28 MED ORDER — BISACODYL 10 MG RE SUPP
10.0000 mg | Freq: Every day | RECTAL | Status: DC | PRN
  Administered 2012-12-31: 10 mg via RECTAL
  Filled 2012-12-28: qty 1

## 2012-12-28 MED ORDER — CEFAZOLIN SODIUM 1-5 GM-% IV SOLN
1.0000 g | Freq: Three times a day (TID) | INTRAVENOUS | Status: AC
  Administered 2012-12-28 – 2012-12-29 (×2): 1 g via INTRAVENOUS
  Filled 2012-12-28 (×3): qty 50

## 2012-12-28 MED ORDER — ARTIFICIAL TEARS OP OINT
TOPICAL_OINTMENT | OPHTHALMIC | Status: DC | PRN
  Administered 2012-12-28: 1 via OPHTHALMIC

## 2012-12-28 MED ORDER — THROMBIN 5000 UNITS EX SOLR
OROMUCOSAL | Status: DC | PRN
  Administered 2012-12-28: 17:00:00 via TOPICAL

## 2012-12-28 MED ORDER — LIDOCAINE HCL (CARDIAC) 20 MG/ML IV SOLN
INTRAVENOUS | Status: DC | PRN
  Administered 2012-12-28: 65 mg via INTRAVENOUS

## 2012-12-28 MED ORDER — BUPIVACAINE HCL (PF) 0.25 % IJ SOLN
INTRAMUSCULAR | Status: DC | PRN
  Administered 2012-12-28: 10 mL

## 2012-12-28 MED ORDER — DEXTROSE 5 % IV SOLN
INTRAVENOUS | Status: DC | PRN
  Administered 2012-12-28: 16:00:00 via INTRAVENOUS

## 2012-12-28 MED ORDER — OXYCODONE HCL 5 MG/5ML PO SOLN: 5.0000 mg | Freq: Once | ORAL | Status: DC | PRN

## 2012-12-28 MED ORDER — PROPOFOL 10 MG/ML IV BOLUS
INTRAVENOUS | Status: DC | PRN
  Administered 2012-12-28: 50 mg via INTRAVENOUS
  Administered 2012-12-28: 150 mg via INTRAVENOUS
  Administered 2012-12-28: 20 mg via INTRAVENOUS

## 2012-12-28 SURGICAL SUPPLY — 72 items
BAG DECANTER FOR FLEXI CONT (MISCELLANEOUS) ×2 IMPLANT
BENZOIN TINCTURE PRP APPL 2/3 (GAUZE/BANDAGES/DRESSINGS) ×4 IMPLANT
BIT DRILL NEURO 2X3.1 SFT TUCH (MISCELLANEOUS) ×1 IMPLANT
BLADE SURG 11 STRL SS (BLADE) IMPLANT
BLADE SURG ROTATE 9660 (MISCELLANEOUS) ×2 IMPLANT
BLADE ULTRA TIP 2M (BLADE) IMPLANT
BRUSH SCRUB EZ 1% IODOPHOR (MISCELLANEOUS) IMPLANT
BRUSH SCRUB EZ PLAIN DRY (MISCELLANEOUS) ×2 IMPLANT
CANISTER SUCTION 2500CC (MISCELLANEOUS) ×2 IMPLANT
CLOTH BEACON ORANGE TIMEOUT ST (SAFETY) ×2 IMPLANT
CONT SPEC 4OZ CLIKSEAL STRL BL (MISCELLANEOUS) ×2 IMPLANT
DECANTER SPIKE VIAL GLASS SM (MISCELLANEOUS) IMPLANT
DERMABOND ADVANCED (GAUZE/BANDAGES/DRESSINGS) ×1
DERMABOND ADVANCED .7 DNX12 (GAUZE/BANDAGES/DRESSINGS) ×1 IMPLANT
DRAPE C-ARM 42X72 X-RAY (DRAPES) ×4 IMPLANT
DRAPE LAPAROTOMY 100X72 PEDS (DRAPES) ×2 IMPLANT
DRAPE MICROSCOPE LEICA (MISCELLANEOUS) IMPLANT
DRAPE POUCH INSTRU U-SHP 10X18 (DRAPES) ×2 IMPLANT
DRILL 12MM (DRILL) ×2 IMPLANT
DRILL NEURO 2X3.1 SOFT TOUCH (MISCELLANEOUS) ×2
DURAPREP 26ML APPLICATOR (WOUND CARE) ×2 IMPLANT
ELECT REM PT RETURN 9FT ADLT (ELECTROSURGICAL) ×2
ELECTRODE REM PT RTRN 9FT ADLT (ELECTROSURGICAL) ×1 IMPLANT
EVACUATOR 1/8 PVC DRAIN (DRAIN) ×2 IMPLANT
GAUZE SPONGE 4X4 16PLY XRAY LF (GAUZE/BANDAGES/DRESSINGS) ×2 IMPLANT
GLOVE BIOGEL PI IND STRL 7.5 (GLOVE) ×1 IMPLANT
GLOVE BIOGEL PI IND STRL 8.5 (GLOVE) ×2 IMPLANT
GLOVE BIOGEL PI INDICATOR 7.5 (GLOVE) ×1
GLOVE BIOGEL PI INDICATOR 8.5 (GLOVE) ×2
GLOVE ECLIPSE 7.5 STRL STRAW (GLOVE) ×10 IMPLANT
GLOVE ECLIPSE 8.5 STRL (GLOVE) ×4 IMPLANT
GLOVE EXAM NITRILE LRG STRL (GLOVE) IMPLANT
GLOVE EXAM NITRILE MD LF STRL (GLOVE) IMPLANT
GLOVE EXAM NITRILE XL STR (GLOVE) IMPLANT
GLOVE EXAM NITRILE XS STR PU (GLOVE) IMPLANT
GOWN BRE IMP SLV AUR LG STRL (GOWN DISPOSABLE) IMPLANT
GOWN BRE IMP SLV AUR XL STRL (GOWN DISPOSABLE) ×4 IMPLANT
GOWN STRL REIN 2XL LVL4 (GOWN DISPOSABLE) ×4 IMPLANT
HEMOSTAT SURGICEL 2X14 (HEMOSTASIS) IMPLANT
KIT BASIN OR (CUSTOM PROCEDURE TRAY) ×2 IMPLANT
KIT ROOM TURNOVER OR (KITS) ×2 IMPLANT
NEEDLE HYPO 22GX1.5 SAFETY (NEEDLE) ×2 IMPLANT
NEEDLE SPNL 18GX3.5 QUINCKE PK (NEEDLE) IMPLANT
NS IRRIG 1000ML POUR BTL (IV SOLUTION) ×2 IMPLANT
PACK FOAM VITOSS 5CC (Orthopedic Implant) ×2 IMPLANT
PACK LAMINECTOMY NEURO (CUSTOM PROCEDURE TRAY) ×2 IMPLANT
PAD ARMBOARD 7.5X6 YLW CONV (MISCELLANEOUS) ×4 IMPLANT
PATTIES SURGICAL .25X.25 (GAUZE/BANDAGES/DRESSINGS) IMPLANT
PIN MAYFIELD SKULL DISP (PIN) ×2 IMPLANT
ROD 120MM (Rod) ×1 IMPLANT
ROD SPNL 120X3.3XNS LF CVD (Rod) ×1 IMPLANT
RUBBERBAND STERILE (MISCELLANEOUS) IMPLANT
SCREW POLYAXIAL 12MM (Screw) ×16 IMPLANT
SCREW SET (Screw) ×16 IMPLANT
SET TUBING W/EXT DISP (INSTRUMENTS) ×2 IMPLANT
SONIC CONTROL SERRATED KNIFE ×2 IMPLANT
SPONGE GAUZE 4X4 12PLY (GAUZE/BANDAGES/DRESSINGS) ×2 IMPLANT
SPONGE LAP 4X18 X RAY DECT (DISPOSABLE) IMPLANT
SPONGE SURGIFOAM ABS GEL SZ50 (HEMOSTASIS) IMPLANT
STAPLER SKIN PROX WIDE 3.9 (STAPLE) ×2 IMPLANT
STRIP CLOSURE SKIN 1/2X4 (GAUZE/BANDAGES/DRESSINGS) IMPLANT
SUT ETHILON 3 0 FSL (SUTURE) IMPLANT
SUT VIC AB 0 CT1 18XCR BRD8 (SUTURE) ×1 IMPLANT
SUT VIC AB 0 CT1 8-18 (SUTURE) ×1
SUT VIC AB 2-0 CP2 18 (SUTURE) ×2 IMPLANT
SUT VIC AB 3-0 SH 8-18 (SUTURE) ×4 IMPLANT
SYR 20ML ECCENTRIC (SYRINGE) ×2 IMPLANT
TAPE CLOTH SURG 4X10 WHT LF (GAUZE/BANDAGES/DRESSINGS) ×2 IMPLANT
TOWEL OR 17X24 6PK STRL BLUE (TOWEL DISPOSABLE) ×2 IMPLANT
TOWEL OR 17X26 10 PK STRL BLUE (TOWEL DISPOSABLE) ×2 IMPLANT
TRAY FOLEY CATH 14FRSI W/METER (CATHETERS) IMPLANT
WATER STERILE IRR 1000ML POUR (IV SOLUTION) ×2 IMPLANT

## 2012-12-28 NOTE — Progress Notes (Signed)
Chaplain Note:  Chaplain visited with pt and pt's family.  Pt was resting in bed, awake, alert, and oriented.  Pt's family was at bedside.  Chaplain provided spiritual comfort, support, and prayer for pt and pt's family.  Chaplain also assisted pt in completing a healthcare power of attorney.  Once completed, chaplain placed a copy in the pt's chart, and gave pt the original and one copy of the document.  Pt and pt's family expressed appreciation for chaplain support.  Chaplain will follow up as needed.  12/28/12 1200  Clinical Encounter Type  Visited With Patient and family together  Visit Type Spiritual support;Other (Comment) (Advanced Directive )  Referral From Social work  Spiritual Encounters  Spiritual Needs Prayer;Emotional  Stress Factors  Patient Stress Factors Health changes;Major life changes;Other (Comment) (Pt facing surgery.)  Family Stress Factors Major life changes  Advance Directives (For Healthcare)  Advance Directive Patient would like information;Patient has advance directive, copy in chart  Type of Advance Directive Healthcare Power of Diona Browner [place], [place] [phone]

## 2012-12-28 NOTE — Consult Note (Signed)
  Patient is for posterior decompression and arthrodesis C3-C6 today

## 2012-12-28 NOTE — Transfer of Care (Signed)
Immediate Anesthesia Transfer of Care Note  Patient: [name]  Procedure(s) Performed: Procedure(s) with comments: POSTERIOR CERVICAL FUSION/FORAMINOTOMY LEVEL 3 (N/A) - Posterior Cervical Three-Six Laminectomy and Fusion  Patient Location: PACU  Anesthesia Type:General  Level of Consciousness: awake and patient cooperative  Airway & Oxygen Therapy: Patient Spontanous Breathing and Patient connected to face mask oxygen  Post-op Assessment: Report given to PACU RN and Post -op Vital signs reviewed and stable  Post vital signs: Reviewed and stable  Complications: No apparent anesthesia complications

## 2012-12-28 NOTE — Anesthesia Preprocedure Evaluation (Addendum)
Anesthesia Evaluation  Patient identified by MRN, date of birth, ID band Patient awake    Reviewed: Allergy & Precautions, H&P , NPO status , Patient's Chart, lab work & pertinent test results  History of Anesthesia Complications Negative for: history of anesthetic complications  Airway Mallampati: II  Neck ROM: Full    Dental  (+) Edentulous Upper   Pulmonary  breath sounds clear to auscultation        Cardiovascular hypertension, + CAD and +CHF Rhythm:Regular Rate:Normal + Systolic murmurs    Neuro/Psych  C-spine not cleared    GI/Hepatic   Endo/Other    Renal/GU Renal disease     Musculoskeletal   Abdominal (+) + obese,   Peds  Hematology   Anesthesia Other Findings   Reproductive/Obstetrics                          Anesthesia Physical Anesthesia Plan  ASA: III  Anesthesia Plan: General   Post-op Pain Management:    Induction: Intravenous  Airway Management Planned: Video Laryngoscope Planned and Oral ETT  Additional Equipment:   Intra-op Plan:   Post-operative Plan: Possible Post-op intubation/ventilation  Informed Consent: I have reviewed the patients History and Physical, chart, labs and discussed the procedure including the risks, benefits and alternatives for the proposed anesthesia with the patient or authorized representative who has indicated his/her understanding and acceptance.     Plan Discussed with: CRNA and Surgeon  Anesthesia Plan Comments:         Anesthesia Quick Evaluation

## 2012-12-28 NOTE — Preoperative (Signed)
Beta Blockers   Reason not to administer Beta Blockers:Not Applicable  Metoprolol 50 mg 1100 4.15.14

## 2012-12-28 NOTE — Progress Notes (Signed)
Pt being picked up for surgery at this time, assessments remained unchanged prior to that.

## 2012-12-28 NOTE — Clinical Social Work Note (Signed)
Clinical Social Work   CSW received consult for SNF. PT/OT are recommending SNF. Pt is for surgery today (12/28/2012). CSW will assess pt for SNF post-op. CSW will continue to follow.   Dede Query, MSW, LCSW [phone]

## 2012-12-28 NOTE — Op Note (Addendum)
Date of surgery: 12/28/2012 Preoperative diagnosis: Status post C2 fracture, C5 fracture, central cord injury secondary to severe spinal stenosis C3-4 and C4-5 Postoperative diagnosis: Status post C2 fracture, C5 fracture, central cord injury secondary to severe spinal stenosis C3-4 C4-5 Procedure: Decompressive laminectomy C3 C4-C5 and C6, segmental fixation with facet screws C3-C4 C5-C6 posterior lateral arthrodesis with autograft and allograft C3-C6 Surgeon: Barnett Abu Assistant: Colon Branch Anesthesia: Gen. endotracheal Indications: Patient is a 79 year old individual who sustained a fall at home from a standing position. She was found to have initially a C2 and C5 fracture however on examination it was noted that she was markedly weak in her hands worse on the left than on right patient also developed some spasticity in her lower extremities she been falling considerably at the house. Further workup included an MRI of the cervical spine which demonstrated severe stenosis at the levels of C3-4 and C4-5 with evidence of cord compression and edema at these levels. She was advised regarding the need for surgical decompression and stabilization C3-C6 via a posterior approach.  Procedure: Patient was brought to the operating room supine on a stretcher after the smooth induction of general endotracheal anesthesia, she was placed in a 3 pin headrest, and she was turned prone with care being taken to protect the neck. The neck was fixed in a more or less neutral position however the patient had substantial kyphosis which required a severe reversed Trendelenburg position an effort to flatten the operative surface of the patient's neck. Fluoroscopic evaluation was immediately performed after the patient was positioned it was noted that her neck was in neutral position without displacement of the C2 type II odontoid fracture.  The back of the neck was then prepped with Hibiclens scrub alcohol and DuraPrep.  The neck was then draped sterilely. A midline incision was created and carried down through the cervical dorsal fascia. First spinous processes could be identified at C3 and C4 using fluoroscopic imaging. The dissection was then carried inferiorly to expose C5 and C6. There is considerable brisk bleeding from a rather hyperemic bed in this region. A posterior fracture of C5 is also appreciated and this had not been noted on her MRI or her preoperative CT this was in the lamina of the C5 vertebrae on the right side. Further dissection yielded the facets which were severely sclerotic. It is decided to place the facet screws first as the bony landmarks were best preserved. 12 mm facet screws were placed in C3 C4-C5 and C6 bilaterally using fluoroscopic guidance at each juncture to drill pilot holes tapped and then placed individual screws. Rods were then contoured to fit between the screw heads. These were then tightened with caps and the construct was fixed in a neutral position. Then the laminectomy was performed using a son up at soft to cut through the laminar arch is starting at C4 and C5 and C6. These were removed en bloc once the size of the lamina were loosened. This allow for good decompression of the central canal immediately. No CSF leaks had occurred. C3 was then severely undercut using a 2 and 3 mm Kerrison punch. The superior rim of C3 was allowed to remain along with his articulation C2. Hemostasis was then achieved in the lateral gutters using Surgifoam. This was later irrigated away the lateral gutters were then decorticated using a son up at and a high-speed drill. Once adequate decortication was performed the autograft from the lamina was laid into these lateral gutters. This was  admixed with 5 cc of the cost. Hemostasis was then checked in the surgical bed. When verified the cervical dorsal fascia was closed with interrupted #1 Vicryl over a medium size Hemovac drain. 2-0 Vicryl was used in the  subcutaneous tissues and 3-0 Vicryl was used subcuticularly. Dry dressing sponges were placed over the Dermabond which was placed on the skin a dressing sponge was placed around drain. Then the patient was removed from the 3 pin headrest and turned to the supine position and left recover from general anesthesia. Blood loss was estimated at about 500 cc.

## 2012-12-28 NOTE — OR Nursing (Signed)
Glasses and hearing aid given to son. Gold colored band removed by A. Christell Constant, RN, placed in a bag labeled c pt sticker, and also given to son.

## 2012-12-28 NOTE — Progress Notes (Signed)
Patient ID: [name]  female  OZH:[id number]    DOB: [birth year]    DOA: 12/25/2012  PCP: [name], MD  Assessment/Plan: Principal Problem:   Cervical spine fracture with fall from the bed - Continue c-collar,  MRI C-spine shows fracture C5, neurosurgery following - NPO for surgery today - Given Valium x1 for right sided jerking/spasms  Active Problems:  Rhabdomyolysis: Likely due to fall, gentle hydration   Nasal swelling: No fracture on the x-ray    Hypertension with history of diastolic CHF: Currently stable    Osteoporosis with history of Spinal stenosis of lumbar region chronic back pain - Patient has been living at home, ambulates with a walker but fell out of bed resulting in cervical spine fracture - Continue pain control    Hip pain- LEFT - CT of the hip done - no fractures    AKI (acute kidney injury)- improved with IV fluid hydration    Hyponatremia- resolved  DVT Prophylaxis:   Code Status: Full CODE STATUS  Disposition: Pending neurosurgery, will likely need SNF    Subjective: Alert and awaiting surgery today, noted right-sided leg spasm/jerking x 1 during exam  Objective: Weight change:   Intake/Output Summary (Last 24 hours) at 12/28/12 1419 Last data filed at 12/28/12 1332  Gross per 24 hour  Intake      0 ml  Output   1650 ml  Net  -1650 ml   Blood pressure 179/85, pulse 74, temperature 97.8 F (36.6 C), temperature source Oral, resp. rate 20, height 5\' 4"  (1.626 m), weight 80.2 kg (176 lb 12.9 oz), SpO2 94.00%.  Physical Exam: General: Ax O x 3 NAD. C collar HEENT: ecchymosis around the right eye, swelling of the nasal bone improving CVS: S1-S2 clear,  Chest: CTAB Abdomen: soft NT, ND, NBS Extremities: no c/c/e bilaterally   Lab Results: Basic Metabolic Panel:  Recent Labs Lab 12/26/12 0700 12/28/12 0505  NA 137 138  K 3.9 4.8  CL 100 105  CO2 26 26  GLUCOSE 84 134*  BUN 40* 43*  CREATININE 1.22* 1.05   CALCIUM 9.6 9.9   Liver Function Tests: No results found for this basename: AST, ALT, ALKPHOS, BILITOT, PROT, ALBUMIN,  in the last 168 hours No results found for this basename: LIPASE, AMYLASE,  in the last 168 hours No results found for this basename: AMMONIA,  in the last 168 hours CBC:  Recent Labs Lab 12/25/12 1221 12/26/12 0700 12/28/12 0505  WBC 7.5 7.9 13.1*  NEUTROABS 5.2  --   --   HGB 10.4* 10.8* 11.1*  HCT 30.2* 31.9* 32.1*  MCV 91.0 91.1 90.4  PLT 208 226 247   Cardiac Enzymes:  Recent Labs Lab 12/25/12 1524 12/25/12 1820 12/28/12 0505  CKTOTAL  --  2344* 2989*  TROPONINI <0.30  --   --    BNP: No components found with this basename: POCBNP,  CBG:  Recent Labs Lab 12/28/12 1125  GLUCAP 116*     Micro Results: Recent Results (from the past 240 hour(s))  MRSA PCR SCREENING     Status: None   Collection Time    12/28/12  8:01 AM      Result Value Range Status   MRSA by PCR NEGATIVE  NEGATIVE Final   Comment:            The GeneXpert MRSA Assay (FDA     approved for NASAL specimens     only), is one component of a  comprehensive MRSA colonization     surveillance program. It is not     intended to diagnose MRSA     infection nor to guide or     monitor treatment for     MRSA infections.    Studies/Results: Dg Hip Complete Left  12/25/2012  *RADIOLOGY REPORT*  Clinical Data: Fall and left hip pain.  LEFT HIP - COMPLETE 2+ VIEW  Comparison: Lumbar spine 03/07/2008  Findings: AP view of the pelvis and two views of the left hip were obtained.  The pelvic bony ring is intact.  There is mild joint space narrowing in both hips. There appears to osteophytes near the left femoral head and neck junction.  Difficult to exclude a subtle injury or fracture at this location.  Otherwise, there is no fracture or dislocation.  Symmetric appearance of the sacroiliac joints.  IMPRESSION: No definite fracture or dislocation involving pelvis or left hip. There  is mild irregularity at the junction of the left femoral head and neck which could be related to osteophytes.  If there is high clinical concern for an acute fracture, recommend further evaluation with CT or MRI.   Original Report Authenticated By: Richarda Overlie, M.D.    Ct Head Wo Contrast  12/25/2012  *RADIOLOGY REPORT*  Clinical Data:  Fall, head injury  CT HEAD WITHOUT CONTRAST CT MAXILLOFACIAL WITHOUT CONTRAST CT CERVICAL SPINE WITHOUT CONTRAST  Technique:  Multidetector CT imaging of the head, cervical spine, and maxillofacial structures were performed using the standard protocol without intravenous contrast. Multiplanar CT image reconstructions of the cervical spine and maxillofacial structures were also generated.  Comparison:  None.  CT HEAD  Findings: No evidence of parenchymal hemorrhage or extra-axial fluid collection. No mass lesion, mass effect, or midline shift.  No CT evidence of acute infarction.  Subcortical white matter and periventricular small vessel ischemic changes.  Intracranial atherosclerosis.  Global cortical atrophy.  No ventriculomegaly.  Mild mucosal thickening in the bilateral ethmoid sinuses. Mastoid surgery bilaterally.  No evidence of calvarial fracture.  IMPRESSION: No evidence of acute intracranial abnormality.  Atrophy with small vessel ischemic changes and intracranial atherosclerosis.  CT MAXILLOFACIAL  Findings:  No evidence of maxillofacial fracture.  Mild mucosal thickening in the bilateral ethmoid sinuses. Chronic changes involving the left maxillary sinus.  Prior bilateral mastoid surgery.  The bilateral orbits, including the globes and retroconal soft tissues, are within normal limits.  IMPRESSION: No evidence of maxillofacial fracture.  CT CERVICAL SPINE  Findings:   Exaggerated cervical lordosis.  Type 2 dens fracture (series 900/image 25).  Anterior inferior corner fracture at C5 (series 900/image 26), displaced.  Moderate multilevel degenerative changes.  No  prevertebral soft tissue swelling.  Visualized thyroid is heterogeneous/nodular.  Vascular calcifications.  Visualized lung apices are clear.  IMPRESSION: Type 2 dens fracture.  Displaced anterior inferior corner fracture at C5.  Critical Value/emergent results were called by telephone at the time of interpretation on 12/25/2012 at 1425 hours to Dr. Donnetta Hutching, who verbally acknowledged these results.   Original Report Authenticated By: Charline Bills, M.D.    Ct Cervical Spine Wo Contrast  12/25/2012  *RADIOLOGY REPORT*  Clinical Data:  Fall, head injury  CT HEAD WITHOUT CONTRAST CT MAXILLOFACIAL WITHOUT CONTRAST CT CERVICAL SPINE WITHOUT CONTRAST  Technique:  Multidetector CT imaging of the head, cervical spine, and maxillofacial structures were performed using the standard protocol without intravenous contrast. Multiplanar CT image reconstructions of the cervical spine and maxillofacial structures were also generated.  Comparison:  None.  CT HEAD  Findings: No evidence of parenchymal hemorrhage or extra-axial fluid collection. No mass lesion, mass effect, or midline shift.  No CT evidence of acute infarction.  Subcortical white matter and periventricular small vessel ischemic changes.  Intracranial atherosclerosis.  Global cortical atrophy.  No ventriculomegaly.  Mild mucosal thickening in the bilateral ethmoid sinuses. Mastoid surgery bilaterally.  No evidence of calvarial fracture.  IMPRESSION: No evidence of acute intracranial abnormality.  Atrophy with small vessel ischemic changes and intracranial atherosclerosis.  CT MAXILLOFACIAL  Findings:  No evidence of maxillofacial fracture.  Mild mucosal thickening in the bilateral ethmoid sinuses. Chronic changes involving the left maxillary sinus.  Prior bilateral mastoid surgery.  The bilateral orbits, including the globes and retroconal soft tissues, are within normal limits.  IMPRESSION: No evidence of maxillofacial fracture.  CT CERVICAL SPINE  Findings:    Exaggerated cervical lordosis.  Type 2 dens fracture (series 900/image 25).  Anterior inferior corner fracture at C5 (series 900/image 26), displaced.  Moderate multilevel degenerative changes.  No prevertebral soft tissue swelling.  Visualized thyroid is heterogeneous/nodular.  Vascular calcifications.  Visualized lung apices are clear.  IMPRESSION: Type 2 dens fracture.  Displaced anterior inferior corner fracture at C5.  Critical Value/emergent results were called by telephone at the time of interpretation on 12/25/2012 at 1425 hours to Dr. Donnetta Hutching, who verbally acknowledged these results.   Original Report Authenticated By: Charline Bills, M.D.    Mr Cervical Spine W Wo Contrast  12/25/2012  *RADIOLOGY REPORT*  Clinical Data: Quadrant parapsoas.  Cervical spine fractures after a fall today.  MRI CERVICAL SPINE WITHOUT CONTRAST  Technique:  Multiplanar and multiecho pulse sequences of the cervical spine, to include the craniocervical junction and cervicothoracic junction, were obtained according to standard protocol without intravenous contrast.  Comparison: CT cervical spine 12/25/2012.  Findings: The patient is severely claustrophobic.  Efforts were made to [facility] anxiety with medication and positioning.  Images are suboptimal and best obtainable.  Type 2 odontoid fracture without displacement.  Mild pannus surrounds the odontoid.  There is no craniocervical junction narrowing.  No evidence for vertebral artery injury.  Anterior inferior osteophytic fracture of C5 extends to the disc space,  without significant displacement/subluxation. No post traumatic disc herniation at C5-6. Slight prevertebral soft tissue swelling.  Severe disc space narrowing at C4-5, in combination with facet arthropathy and ligamentum flavum calcification, results in severe spinal stenosis measuring 3 mm left and 4 mm right.  Slightly caudad to C4-5, the cord is mildly hyperintense dorsally (image 3 series 4, image 14 series 8).   There is no intraspinal hematoma or hematomyelia, and this cord hyperintensity may represent chronic myelomalacia although central cord syndrome with edema not excluded.  Due to severe compression and lack of signal from the most severely narrowed segment, I am unable to assess for gliosis at C4-5.  Sagittal gradient sequence for trauma is suboptimal, but I am fairly confident there is no hematomyelia or intraspinal hematoma.  Moderate to severe stenosis is present at C3-4 with posterior osteophytic spurring.  Canal diameter at this level is improved over that at C4-5, measuring 6 mm AP diameter.  Mild spondylosis at C6-7 and C7-T1.  Facet mediated slip of 3 mm at T1-2 is non compressive.  IMPRESSION: Type 2 odontoid fracture and anterior inferior endplate C5 fracture as described above, without associated intraspinal hemorrhage or hematomyelia.  Severe multifactorial spondylosis C4-5 with critical cord compression. Canal diameter 3-4 mm.  Slight cord hyperintensity just caudad  to the severe stenosis not clearly post traumatic, and may represent chronic myelomalacia versus acute central cord syndrome.  Findings discussed with Dr. Danielle Dess.   Original Report Authenticated By: Davonna Belling, M.D.    Dg Shoulder Left  12/25/2012  *RADIOLOGY REPORT*  Clinical Data: Fall.  LEFT SHOULDER - 2+ VIEW  Comparison: Chest radiograph 03/19/2008  Findings: Three views of the left shoulder are negative for a fracture or dislocation.  The ribs appear to be intact.  No definite pneumothorax. Degenerative changes in the lower cervical spine.  IMPRESSION: No acute bony abnormality in the left shoulder.   Original Report Authenticated By: Richarda Overlie, M.D.    Dg Hand Complete Left  12/25/2012  *RADIOLOGY REPORT*  Clinical Data: Larey Seat and injured left hand.  LEFT HAND - COMPLETE 3+ VIEW  Comparison: None.  Findings: Severe generalized osseous demineralization.  Joint space narrowing with associated hypertrophic changes involving all of  the joints of the wrist, with associated soft tissue swelling.  Joint space narrowing involving the IP joints of all of the fingers.  No evidence of an acute fracture or dislocation.  IMPRESSION: No acute osseous abnormality.  Severe osseous demineralization. Severe osteoarthritis involving the wrist.  Moderate osteoarthritis involving the IP joints of the hand.   Original Report Authenticated By: Hulan Saas, M.D.    Ct Maxillofacial Wo Cm  12/25/2012  *RADIOLOGY REPORT*  Clinical Data:  Fall, head injury  CT HEAD WITHOUT CONTRAST CT MAXILLOFACIAL WITHOUT CONTRAST CT CERVICAL SPINE WITHOUT CONTRAST  Technique:  Multidetector CT imaging of the head, cervical spine, and maxillofacial structures were performed using the standard protocol without intravenous contrast. Multiplanar CT image reconstructions of the cervical spine and maxillofacial structures were also generated.  Comparison:  None.  CT HEAD  Findings: No evidence of parenchymal hemorrhage or extra-axial fluid collection. No mass lesion, mass effect, or midline shift.  No CT evidence of acute infarction.  Subcortical white matter and periventricular small vessel ischemic changes.  Intracranial atherosclerosis.  Global cortical atrophy.  No ventriculomegaly.  Mild mucosal thickening in the bilateral ethmoid sinuses. Mastoid surgery bilaterally.  No evidence of calvarial fracture.  IMPRESSION: No evidence of acute intracranial abnormality.  Atrophy with small vessel ischemic changes and intracranial atherosclerosis.  CT MAXILLOFACIAL  Findings:  No evidence of maxillofacial fracture.  Mild mucosal thickening in the bilateral ethmoid sinuses. Chronic changes involving the left maxillary sinus.  Prior bilateral mastoid surgery.  The bilateral orbits, including the globes and retroconal soft tissues, are within normal limits.  IMPRESSION: No evidence of maxillofacial fracture.  CT CERVICAL SPINE  Findings:   Exaggerated cervical lordosis.  Type 2 dens  fracture (series 900/image 25).  Anterior inferior corner fracture at C5 (series 900/image 26), displaced.  Moderate multilevel degenerative changes.  No prevertebral soft tissue swelling.  Visualized thyroid is heterogeneous/nodular.  Vascular calcifications.  Visualized lung apices are clear.  IMPRESSION: Type 2 dens fracture.  Displaced anterior inferior corner fracture at C5.  Critical Value/emergent results were called by telephone at the time of interpretation on 12/25/2012 at 1425 hours to Dr. Donnetta Hutching, who verbally acknowledged these results.   Original Report Authenticated By: Charline Bills, M.D.     Medications: Scheduled Meds: . calcium carbonate  1 tablet Oral TID WC  . dexamethasone  4 mg Intravenous Q6H  . metoprolol  50 mg Oral BID  . multivitamin with minerals  1 tablet Oral Daily  . polyethylene glycol  17 g Oral Daily  . pramipexole  0.25  mg Oral QHS  . simvastatin  10 mg Oral q1800      LOS: 3 days   Bluma Buresh M.D. [facility] 12/28/2012, 2:19 PM Pager: [phone]  If 7PM-7AM, please contact night-coverage [email or web address] Password [email or web address]

## 2012-12-28 NOTE — Anesthesia Postprocedure Evaluation (Signed)
Anesthesia Post Note  Patient: [name]  Procedure(s) Performed: Procedure(s) (LRB): POSTERIOR CERVICAL FUSION/FORAMINOTOMY LEVEL 3 (N/A)  Anesthesia type: general  Patient location: PACU  Post pain: Pain level controlled  Post assessment: Patient's Cardiovascular Status Stable  Last Vitals:  Filed Vitals:   12/28/12 1951  BP:   Pulse:   Temp: 37 C  Resp:     Post vital signs: Reviewed and stable  Level of consciousness: sedated  Complications: No apparent anesthesia complications

## 2012-12-28 NOTE — Progress Notes (Signed)
Pt was DTV by 2300, bladder scanned 547 cc, NP notified.  Order for PRN I&O cath, 700 cc out.  Pt DTV again at 0600.

## 2012-12-29 ENCOUNTER — Encounter (HOSPITAL_COMMUNITY): Payer: Self-pay | Admitting: Neurological Surgery

## 2012-12-29 DIAGNOSIS — M4712 Other spondylosis with myelopathy, cervical region: Secondary | ICD-10-CM

## 2012-12-29 DIAGNOSIS — W19XXXA Unspecified fall, initial encounter: Secondary | ICD-10-CM

## 2012-12-29 DIAGNOSIS — S12100A Unspecified displaced fracture of second cervical vertebra, initial encounter for closed fracture: Secondary | ICD-10-CM

## 2012-12-29 DIAGNOSIS — M5412 Radiculopathy, cervical region: Secondary | ICD-10-CM

## 2012-12-29 LAB — BASIC METABOLIC PANEL
BUN: 50 mg/dL — ABNORMAL HIGH (ref 6–23)
Calcium: 9.5 mg/dL (ref 8.4–10.5)
Creatinine, Ser: 1.26 mg/dL — ABNORMAL HIGH (ref 0.50–1.10)
GFR calc Af Amer: 46 mL/min — ABNORMAL LOW (ref >90)
GFR calc non Af Amer: 39 mL/min — ABNORMAL LOW (ref >90)

## 2012-12-29 LAB — CBC
HCT: 30.1 % — ABNORMAL LOW (ref 36.0–46.0)
MCH: 31.2 pg (ref 26.0–34.0)
MCHC: 33.9 g/dL (ref 30.0–36.0)
MCV: 92 fL (ref 78.0–100.0)
Platelets: 239 10*3/uL (ref 150–400)
RDW: 13.5 % (ref 11.5–15.5)
WBC: 15.3 10*3/uL — ABNORMAL HIGH (ref 4.0–10.5)

## 2012-12-29 MED ORDER — VANCOMYCIN HCL IN DEXTROSE 1-5 GM/200ML-% IV SOLN: 1000.0000 mg | INTRAVENOUS | Status: DC

## 2012-12-29 MED ORDER — SENNOSIDES-DOCUSATE SODIUM 8.6-50 MG PO TABS
1.0000 | ORAL_TABLET | Freq: Two times a day (BID) | ORAL | Status: DC
  Administered 2012-12-29 – 2013-01-03 (×7): 1 via ORAL
  Filled 2012-12-29 (×7): qty 1

## 2012-12-29 MED ORDER — OXYCODONE HCL 5 MG PO TABS
5.0000 mg | ORAL_TABLET | ORAL | Status: DC | PRN
  Administered 2012-12-29 – 2013-01-03 (×18): 5 mg via ORAL
  Filled 2012-12-29 (×18): qty 1

## 2012-12-29 NOTE — Progress Notes (Signed)
UR completed.   Appears pt is planned for SNF. CSW aware.

## 2012-12-29 NOTE — Plan of Care (Signed)
Problem: Consults Goal: Diagnosis - Spinal Surgery Cervical Spine Fusion     

## 2012-12-29 NOTE — Progress Notes (Signed)
Subjective: Patient reports Vital signs stable motor function appears good.  Objective: Vital signs in last 24 hours: Temp:  [97.3 F (36.3 C)-99.2 F (37.3 C)] 97.3 F (36.3 C) (04/16 0700) Pulse Rate:  [56-93] 56 (04/16 0800) Resp:  [9-25] 9 (04/16 0800) BP: (126-179)/(54-85) 163/56 mmHg (04/16 0800) SpO2:  [94 %-100 %] 100 % (04/16 0800) Weight:  [82.5 kg (181 lb 14.1 oz)] 82.5 kg (181 lb 14.1 oz) (04/15 2030)  Intake/Output from previous day: 04/15 0701 - 04/16 0700 In: 5822.5 [I.V.:5722.5; IV Piggyback:100] Out: 2050 [Urine:1700; Drains:150; Blood:200] Intake/Output this shift: Total I/O In: 50 [I.V.:50] Out: 100 [Urine:100]  Incision clean and dry left hand still has 3/5 strength. Finger extensors appear to be working slightly better though patient cannot appreciate this. Drain with output of 150 cc.  Lab Results:  Recent Labs  12/28/12 0505 12/29/12 0512  WBC 13.1* 15.3*  HGB 11.1* 10.2*  HCT 32.1* 30.1*  PLT 247 239   BMET  Recent Labs  12/28/12 0505 12/29/12 0512  NA 138 141  K 4.8 5.3*  CL 105 109  CO2 26 25  GLUCOSE 134* 133*  BUN 43* 50*  CREATININE 1.05 1.26*  CALCIUM 9.9 9.5    Studies/Results: Dg Cervical Spine 1 View  12/28/2012  *RADIOLOGY REPORT*  Clinical Data: 79 year old female undergoing cervical spine surgery.  DG C-ARM 61-120 MIN, DG CERVICAL SPINE - 1 VIEW  Comparison: CT cervical spine 12/25/2012.  Findings: Intraoperative lateral fluoroscopic image at 1648 hours demonstrates bilateral posterior laminar hardware placement from the C3 to the C6 level.  Type 2 odontoid fracture less apparent.  Fluoroscopy time not reported.  IMPRESSION: Posterior fusion hardware placement from the C3 to the C6 level.   Original Report Authenticated By: Erskine Speed, M.D.    Dg C-arm [phone] Min  12/28/2012  *RADIOLOGY REPORT*  Clinical Data: 79 year old female undergoing cervical spine surgery.  DG C-ARM 61-120 MIN, DG CERVICAL SPINE - 1 VIEW   Comparison: CT cervical spine 12/25/2012.  Findings: Intraoperative lateral fluoroscopic image at 1648 hours demonstrates bilateral posterior laminar hardware placement from the C3 to the C6 level.  Type 2 odontoid fracture less apparent.  Fluoroscopy time not reported.  IMPRESSION: Posterior fusion hardware placement from the C3 to the C6 level.   Original Report Authenticated By: Erskine Speed, M.D.     Assessment/Plan: Minimal drop in hematocrit status post surgery. Strength appears minimally stable perhaps slightly improved.  LOS: 4 days  Observe in intensive care unit rehabilitation med consult.   [name] J 12/29/2012, 9:05 AM

## 2012-12-29 NOTE — Clinical Social Work Psychosocial (Signed)
     Clinical Social Work Department BRIEF PSYCHOSOCIAL ASSESSMENT 12/29/2012  Patient:  [name], [name]     Account Number:  [id number]     Admit date:  12/25/2012  Clinical Social Worker:  Verl Blalock  Date/Time:  12/29/2012 11:00 AM  Referred by:  RN  Date Referred:  12/29/2012 Referred for  SNF Placement   Other Referral:   Interview type:  Patient Other interview type:   Patient son at bedside    PSYCHOSOCIAL DATA Living Status:  ALONE Admitted from facility:   Level of care:   Primary support name:  [name]   [phone]  [name], [name]  [phone] Primary support relationship to patient:  CHILD, ADULT Degree of support available:   Strong    CURRENT CONCERNS Current Concerns  Post-Acute Placement   Other Concerns:    SOCIAL WORK ASSESSMENT / PLAN Clinical Social Worker met with patient and patient son at bedside to offer support and discuss patient needs at discharge.  Patient and patient son state that patient lives alone with frequent checks and meals delivered by 4 of her 5 children who are physically able to assist. Patient children still work but are available to assist in shifts as needed.    Patient is hopeful for placement at inpatient rehab but agreeable to SNF back up.  Patient is hopeful for [facility] or [facility] if SNF is needed.  CSW to complete FL2 and initiate referral to both facilities, however insurance authorization cannot be obtained until inpatient rehab receives denial.  CSW to follow up with patient regarding bed offers and will facilitate SNF discharge if appropriate once medically ready.  CSW remains available for support.   Assessment/plan status:  Psychosocial Support/Ongoing Assessment of Needs Other assessment/ plan:   Information/referral to community resources:   Visual merchandiser offered facility list, however patient and family agreed that they have looked into facilities for other family members and feel  comfortable with [place] [place].    PATIENTS/FAMILYS RESPONSE TO PLAN OF CARE: Clinical Social Worker alert and oriented x3 up in the chair.  Patient and patient family very realistic of patient needs at discharge and willing to accomodate. Patient family hopeful for assistance when patient does in fact discharge home - will provide resources and notify SNF or CIR social workers of family wishes.  Patient and family verbalized their appreciation for CSW support and concern.

## 2012-12-29 NOTE — Consult Note (Signed)
Physical Medicine and Rehabilitation Consult Reason for Consult: C2 fracture, C5 fracture, central cord injury Referring Physician: Dr. Danielle Dess   HPI: [name] is a 79 y.o. right-handed female with history of hypertension, diastolic congestive heart failure. Admitted 12/25/2012 after a fall landing on her face without loss of consciousness she had initially refused per EMS to go to the hospital. She had complaints of left hip pain and family noted left hand weakness and leg weakness. Cranial CT scan was unremarkable for acute changes. CT cervical spine and imaging revealed type II dens fracture as well as displaced anterior inferior corner fracture at C5 with severe spinal stenosis C3-4 and C4-5. Underwent decompressive laminectomy C3, C4-C5 and C6, segmental fixation with facet screws C3-4-5-6 12/28/2012 per Dr. Danielle Dess. Fitted with a soft cervical collar. Postoperative pain management maintained on Decadron protocol. Physical and occupational therapy evaluations are pending. M.D. is requested physical medicine rehabilitation consult to consider inpatient rehabilitation services.  Patient's son is in the room with her. He indicates that patient had frequent falls about a month or 2 ago. She then switched to walker and had no falls using a walker. This last fall was out of bed. She was independent with all her self-care and mobility but needed assistance for meal prep and shopping and house cleaning. She can fix herself a snack prior to admission  Review of Systems  Respiratory: Positive for cough.   Cardiovascular: Positive for leg swelling.  Gastrointestinal: Positive for constipation.  Musculoskeletal: Positive for myalgias and falls.  All other systems reviewed and are negative.   Past Medical History  Diagnosis Date  . CHF (congestive heart failure)   . Coronary artery disease   . Hypertension   . Arthritis   . Edema of extremities    Past Surgical History  Procedure Laterality  Date  . Joint replacement Bilateral     knee replacements  . Back surgery    . Cholecystectomy     Family History  Problem Relation Age of Onset  . Dementia Sister   . CAD Brother    Social History:  reports that she has never smoked. She does not have any smokeless tobacco history on file. She reports that she does not drink alcohol or use illicit drugs. Allergies:  Allergies  Allergen Reactions  . Penicillins    Medications Prior to Admission  Medication Sig Dispense Refill  . ALPRAZolam (XANAX) 0.25 MG tablet Take 0.25 mg by mouth every 6 (six) hours as needed for sleep.      Marland Kitchen aspirin EC 81 MG tablet Take 81 mg by mouth daily.      . furosemide (LASIX) 20 MG tablet Take 20 mg by mouth 2 (two) times daily.      Marland Kitchen HYDROcodone-acetaminophen (NORCO) 10-325 MG per tablet Take 1 tablet by mouth every 6 (six) hours as needed for pain.      . metoprolol (LOPRESSOR) 50 MG tablet Take 50 mg by mouth 2 (two) times daily.      . Multiple Vitamin (MULTIVITAMIN WITH MINERALS) TABS Take 1 tablet by mouth daily.      . pramipexole (MIRAPEX) 0.125 MG tablet Take 0.25 mg by mouth at bedtime.      . pravastatin (PRAVACHOL) 80 MG tablet Take 80 mg by mouth at bedtime.      . quinapril-hydrochlorothiazide (ACCURETIC) 20-12.5 MG per tablet Take 1 tablet by mouth daily.      Marland Kitchen spironolactone (ALDACTONE) 25 MG tablet Take 25 mg by mouth  daily.        Home: Home Living Lives With: Alone Available Help at Discharge: Available PRN/intermittently Type of Home: House Home Access: Stairs to enter [employer] of Steps: 3 Entrance Stairs-Rails: Left Home Layout: One level Bathroom Shower/Tub: Walk-in shower;Door [employer] Toilet: Standard Bathroom Accessibility: Yes How Accessible: Accessible via walker Home Adaptive Equipment: Walker - rolling;Straight cane;Shower chair with back  Functional History: Prior Function Able to Take Stairs?: Yes Driving: No Vocation: Retired Functional  Status:  Mobility: Bed Mobility Bed Mobility: Rolling Left;Left Sidelying to Sit;Sitting - Scoot to Edge of Bed Rolling Left: 3: Mod assist;With rail Left Sidelying to Sit: 3: Mod assist;With rails Sitting - Scoot to Edge of Bed: 3: Mod assist Transfers Transfers: Sit to Stand;Stand to Sit Sit to Stand: 1: +2 Total assist;With upper extremity assist;From bed Sit to Stand: Patient Percentage: 50% Stand to Sit: 1: +2 Total assist;To chair/3-in-1;With armrests;With upper extremity assist Stand to Sit: Patient Percentage: 50% Stand Pivot Transfers: 1: +2 Total assist Stand Pivot Transfers: Patient Percentage: 60% Ambulation/Gait Ambulation/Gait Assistance: 1: +2 Total assist Ambulation/Gait: Patient Percentage: 30% Ambulation Distance (Feet): 6 Feet Assistive device: 2 person hand held assist Ambulation/Gait Assistance Details: constant cues to stand upright; pt amb with lateral lean to R and posteriorly; very narrow BOS; unable to indpendently correct with cues. Did not attempt amb with RW secondary to pt c/o pain in L shoulder and decreased ROM in L shoulder, pt is very unsteady and demo balance deficits  Gait Pattern: Decreased stride length;Trunk flexed;Narrow base of support;Shuffle;Lateral trunk lean to right Gait velocity: decreased General Gait Details: pt demo high anxiety and fear of falling with amb and transfers  Stairs: No Wheelchair Mobility Wheelchair Mobility: No  ADL: ADL Eating/Feeding: Moderate assistance Where Assessed - Eating/Feeding: Chair Grooming: Moderate assistance Where Assessed - Grooming: Supported sitting Upper Body Bathing: Maximal assistance Where Assessed - Upper Body Bathing: Supported sitting Lower Body Bathing: +1 Total assistance Where Assessed - Lower Body Bathing: Supported sit to stand Upper Body Dressing: Maximal assistance Where Assessed - Upper Body Dressing: Supported sitting Lower Body Dressing: +1 Total assistance Where Assessed -  Lower Body Dressing: Supported sit to Pharmacist, hospital: +2 Total assistance Toilet Transfer Method: Surveyor, minerals: Other (comment) (simulated) Equipment Used: Gait belt Transfers/Ambulation Related to ADLs: +2 Mode A ADL Comments: Focus of session oneducating familyon use of red tubing to build up handles forutensils and toothbrush. Able to feed self with spoon after set up. Educated pt on using B hands to manage cup with lid for drinking. also taught pt/family theraputty ex.  Cognition: Cognition Arousal/Alertness: Awake/alert Orientation Level: Oriented X4 Cognition Arousal/Alertness: Awake/alert Behavior During Therapy: WFL for tasks performed Orientation Level: Appears intact for tasks assessed  Blood pressure 159/56, pulse 58, temperature 98 F (36.7 C), temperature source Oral, resp. rate 10, height 5\' 2"  (1.575 m), weight 82.5 kg (181 lb 14.1 oz), SpO2 100.00%. Physical Exam  Vitals reviewed. Eyes: EOM are normal.  Neck:  Soft cervical collar in place  Cardiovascular: Normal rate and regular rhythm.   Pulmonary/Chest: Breath sounds normal. No respiratory distress.  Abdominal: Soft. Bowel sounds are normal. She exhibits no distension.  Musculoskeletal:  +1 edema lower extremities  Skin:  Surgical site is dressed. Patient with noted multiple bruising about the face and fore head  Psychiatric: She has a normal mood and affect.  Wound drain with dark serosanguineous Motor strength is 3/5 in bilateral deltoid left bicep tricep 2 minus  over 5 left grip 3 minus over 5 right grip 4 minus over 5 bicep tricep 3 minus bilateral hip flexors 4 minus bilateral knee extensors 3 minus bilateral ankle dorsiflexor plantar flexor toe flexors and extensors Finger-nose-finger testing shows mild dysmetria bilateral upper extremities Lower extremity heel-to-shin not tested secondary to weakness Sensory exam reduced to light touch in right hand, light touch and  proprioception left hand light touch and proprioception bilateral lower extremities  Results for orders placed during the hospital encounter of 12/25/12 (from the past 24 hour(s))  MRSA PCR SCREENING     Status: None   Collection Time    12/28/12  8:01 AM      Result Value Range   MRSA by PCR NEGATIVE  NEGATIVE  GLUCOSE, CAPILLARY     Status: Abnormal   Collection Time    12/28/12 11:25 AM      Result Value Range   Glucose-Capillary 116 (*) 70 - 99 mg/dL  CBC     Status: Abnormal   Collection Time    12/29/12  5:12 AM      Result Value Range   WBC 15.3 (*) 4.0 - 10.5 K/uL   RBC 3.27 (*) 3.87 - 5.11 MIL/uL   Hemoglobin 10.2 (*) 12.0 - 15.0 g/dL   HCT 16.1 (*) 09.6 - 04.5 %   MCV 92.0  78.0 - 100.0 fL   MCH 31.2  26.0 - 34.0 pg   MCHC 33.9  30.0 - 36.0 g/dL   RDW 40.9  81.1 - 91.4 %   Platelets 239  150 - 400 K/uL   Dg Cervical Spine 1 View  12/28/2012  *RADIOLOGY REPORT*  Clinical Data: 79 year old female undergoing cervical spine surgery.  DG C-ARM 61-120 MIN, DG CERVICAL SPINE - 1 VIEW  Comparison: CT cervical spine 12/25/2012.  Findings: Intraoperative lateral fluoroscopic image at 1648 hours demonstrates bilateral posterior laminar hardware placement from the C3 to the C6 level.  Type 2 odontoid fracture less apparent.  Fluoroscopy time not reported.  IMPRESSION: Posterior fusion hardware placement from the C3 to the C6 level.   Original Report Authenticated By: Erskine Speed, M.D.    Dg C-arm [phone] Min  12/28/2012  *RADIOLOGY REPORT*  Clinical Data: 79 year old female undergoing cervical spine surgery.  DG C-ARM 61-120 MIN, DG CERVICAL SPINE - 1 VIEW  Comparison: CT cervical spine 12/25/2012.  Findings: Intraoperative lateral fluoroscopic image at 1648 hours demonstrates bilateral posterior laminar hardware placement from the C3 to the C6 level.  Type 2 odontoid fracture less apparent.  Fluoroscopy time not reported.  IMPRESSION: Posterior fusion hardware placement from the C3 to  the C6 level.   Original Report Authenticated By: Erskine Speed, M.D.     Assessment/Plan: Diagnosis: Cervical myelopathy with tetraparesis and sensory deficits as well as neurogenic bladder POD#1 status post C3-C6 posterior decompression and fusion. 1. Does the need for close, 24 hr/day medical supervision in concert with the patient's rehab needs make it unreasonable for this patient to be served in a less intensive setting? Yes 2. Co-Morbidities requiring supervision/potential complications: Osteoporosis, lumbar spinal stenosis, history of CHF, coronary artery disease 3. Due to bladder management, bowel management, safety, skin/wound care, disease management, medication administration, pain management and patient education, does the patient require 24 hr/day rehab nursing? Yes 4. Does the patient require coordinated care of a physician, rehab nurse, PT (1-2 hrs/day, 5 days/week) and OT (1-2 hrs/day, 5 days/week) to address physical and functional deficits in the context of the above  medical diagnosis(es)? Yes Addressing deficits in the following areas: balance, endurance, locomotion, strength, transferring, bowel/bladder control, bathing, dressing, feeding, grooming and toileting 5. Can the patient actively participate in an intensive therapy program of at least 3 hrs of therapy per day at least 5 days per week? Yes 6. The potential for patient to make measurable gains while on inpatient rehab is good 7. Anticipated functional outcomes upon discharge from inpatient rehab are Supervision to minimal assist mobility with PT, Supervision min assist ADLs with OT, Not applicable with SLP. 8. Estimated rehab length of stay to reach the above functional goals is: 2 weeks 9. Does the patient have adequate social supports to accommodate these discharge functional goals? Potentially 10. Anticipated D/C setting: Home 11. Anticipated post D/C treatments: HH therapy 12. Overall Rehab/Functional Prognosis:  good  RECOMMENDATIONS: This patient's condition is appropriate for continued rehabilitative care in the following setting: CIR Patient has agreed to participate in recommended program. Potentially Note that insurance prior authorization may be required for reimbursement for recommended care.  Comment:Patient and family indicate that there is not much more social support other than what is already being provided at home. If she did not get back to modified independent for basic ADLs and mobility she would have to go SNF    12/29/2012

## 2012-12-29 NOTE — Progress Notes (Signed)
[facility] Progress Note [place] TEAM 1 - Stepdown/ICU TEAM   Americus A Jurczyk RUE:[id number] DOB: November 26, 1930 DOA: 12/25/2012 PCP: [name], MD  Brief narrative: 79 y.o. female with history of HTN, presumed diastolic chf, severe spinal stenosis, osteoporosis, who suffered a fall at 2 AM - she fell face down and landed on her abdomen, crawled to the life alert system and pushed the button. EMS arrived but she refused to come to the hospital. She later started developing left hip pain, and family also noted some left hand weakness and left leg weakness. She finally agreed to come to the hospital and was found to have A C2 and C 5 spine fracture. She was seen by Dr. Danielle Dess from neurosurgery and he did no recommend surgical management. Patient was placed on C collar.   Assessment/Plan:  Cervical spine fracture after fall from bed  Care as per neurosurgery - status post surgical stabilization 12/28/2012  Rhabdomyolysis due to fall - cont to hydrate - watch trend until essentially resolved  Nasal swelling No fracture on x-ray   Hypertension with history of diastolic CHF BP poorly controlled - likely some element of pain contributing, as well as dexamethasone - adjust medical treatment and follow trend  Osteoporosis with history of Spinal stenosis of lumbar region / chronic back pain  Patient has been living at home, ambulates with a walker but fell out of bed resulting in cervical spine fracture - PT/OT at discretion of neurosurgery   Hip pain- LEFT  CT of the hip done - no fractures   AKI (acute kidney injury) improved with IV fluid hydration initially - now vascillating - keep hydrated - f/u in AM - avoid NSAIDs  Hyponatremia resolved   Code Status: FULL Family Communication: No family present at time of evaluation Disposition Plan: ICU per neurosurgery  Consultants: Neruosurgery  Procedures: 4/15 - Decompressive laminectomy C3 C4-C5 and C6, segmental  fixation with facet screws C3-C4 C5-C6 posterior lateral arthrodesis with autograft and allograft C3-C6  Antibiotics: None  DVT prophylaxis: SCDs  HPI/Subjective: The patient is awake and alert.  She is quite pleasant.  She denies chest pain shortness of breath fevers or chills.  She denies focal neurologic deficits.  She does report pain in her face and neck.  Objective: Blood pressure 165/66, pulse 55, temperature 97.9 F (36.6 C), temperature source Oral, resp. rate 18, height 5\' 2"  (1.575 m), weight 82.5 kg (181 lb 14.1 oz), SpO2 99.00%.  Intake/Output Summary (Last 24 hours) at 12/29/12 1328 Last data filed at 12/29/12 1000  Gross per 24 hour  Intake 6475.5 ml  Output   2150 ml  Net 4325.5 ml     Exam: General: No acute respiratory distress Lungs: Clear to auscultation bilaterally without wheezes or crackles Cardiovascular: Regular rate and rhythm without murmur gallop or rub  Abdomen: Nontender, nondistended, soft, bowel sounds positive, no rebound, no ascites, no appreciable mass Extremities: No significant cyanosis, clubbing, or edema bilateral lower extremities  Data Reviewed: Basic Metabolic Panel:  Recent Labs Lab 12/25/12 1221 12/26/12 0700 12/28/12 0505 12/29/12 0512  NA 130* 137 138 141  K 4.3 3.9 4.8 5.3*  CL 94* 100 105 109  CO2 27 26 26 25   GLUCOSE 93 84 134* 133*  BUN 50* 40* 43* 50*  CREATININE 1.56* 1.22* 1.05 1.26*  CALCIUM 9.5 9.6 9.9 9.5   CBC:  Recent Labs Lab 12/25/12 1221 12/26/12 0700 12/28/12 0505 12/29/12 0512  WBC 7.5 7.9 13.1* 15.3*  NEUTROABS 5.2  --   --   --  HGB 10.4* 10.8* 11.1* 10.2*  HCT 30.2* 31.9* 32.1* 30.1*  MCV 91.0 91.1 90.4 92.0  PLT 208 226 247 239   Cardiac Enzymes:  Recent Labs Lab 12/25/12 1524 12/25/12 1820 12/28/12 0505  CKTOTAL  --  2344* 2989*  TROPONINI <0.30  --   --    CBG:  Recent Labs Lab 12/28/12 1125  GLUCAP 116*    Recent Results (from the past 240 hour(s))  MRSA PCR  SCREENING     Status: None   Collection Time    12/28/12  8:01 AM      Result Value Range Status   MRSA by PCR NEGATIVE  NEGATIVE Final   Comment:            The GeneXpert MRSA Assay (FDA     approved for NASAL specimens     only), is one component of a     comprehensive MRSA colonization     surveillance program. It is not     intended to diagnose MRSA     infection nor to guide or     monitor treatment for     MRSA infections.     Studies:  Recent x-ray studies have been reviewed in detail by the Attending Physician  Scheduled Meds:  Scheduled Meds: . calcium carbonate  1 tablet Oral TID WC  . dexamethasone  4 mg Intravenous Q6H  . docusate sodium  100 mg Oral BID  . metoprolol  50 mg Oral BID  . multivitamin with minerals  1 tablet Oral Daily  . polyethylene glycol  17 g Oral Daily  . pramipexole  0.25 mg Oral QHS  . senna  1 tablet Oral BID  . simvastatin  10 mg Oral q1800  . sodium chloride  3 mL Intravenous Q12H  . vancomycin (VANCOCIN) 1000 mg IVPB  1,000 mg Intravenous Q12H   Continuous Infusions: . sodium chloride 50 mL/hr at 12/29/12 1000  . sodium chloride    . sodium chloride      Time spent on care of this patient:   [facility] T  [facility] Office  [phone] Pager - Text Page per Loretha Stapler as per below:  On-Call/Text Page:      [email or web address]      password [email or web address]  If 7PM-7AM, please contact night-coverage [email or web address] Password [email or web address] 12/29/2012, 1:28 PM   LOS: 4 days

## 2012-12-29 NOTE — Progress Notes (Signed)
Physical Therapy Treatment Patient Details Name: [name] MRN: [id number] DOB: [birth year] Today's Date: 12/29/2012 Time: 4098-1191 PT Time Calculation (min): 29 min  PT Assessment / Plan / Recommendation Comments on Treatment Session  Patient with fall and C2 fracture, C5 fracture, central cord injury secondary to severe spinal stenosis C3-4 and C4-5, now s/p decompressive laminectomy C3 C4-C5 and C6, segmental fixation with facet screws C3-C4 C5-C6 posterior lateral arthrodesis with autograft and allograft C3-C6.  She demonstrates poor activity tolerance with acute pain right shoulder, decreased balance, decreased LE strength and will benefit from skilled PT in the acute setting to maximize independence and allow eventual return to independent after CIR stay.    Follow Up Recommendations  CIR     Does the patient have the potential to tolerate intense rehabilitation   yes  Barriers to Discharge  Doesn't have 24 hour assist      Equipment Recommendations  None recommended by PT    Recommendations for Other Services  None  Frequency Min 5X/week   Plan Discharge plan needs to be updated;Frequency needs to be updated    Precautions / Restrictions Precautions Precautions: Cervical;Fall Precaution Comments: unable to recall cervical precautions Required Braces or Orthoses: Cervical Brace Cervical Brace: Soft collar Restrictions Weight Bearing Restrictions: No   Pertinent Vitals/Pain 10/10 right shoulder    Mobility  Bed Mobility Bed Mobility: Not assessed Details for Bed Mobility Assistance: pt up in chair Transfers Sit to Stand: 1: +2 Total assist;With upper extremity assist;From chair/3-in-1 Sit to Stand: Patient Percentage: 60% (initially 30% until on feet, then able to initiate more) Stand to Sit: 1: +2 Total assist;With upper extremity assist;To chair/3-in-1 Stand to Sit: Patient Percentage: 60% Details for Transfer Assistance: educated on scooting to edge of chair  with max (A) of pad on Left side; sit to stand with assist from pad under patient to lift into standing Ambulation/Gait Ambulation/Gait Assistance: 1: +2 Total assist Ambulation/Gait: Patient Percentage: 40% Ambulation Distance (Feet): 5 Feet Assistive device: Rolling walker Ambulation/Gait Assistance Details: shuffling steps, posterior bias, cues for anterior weight shift onto walker and to allow walker to stay farther out in front Gait Pattern: Shuffle;Trunk flexed;Decreased stride length;Decreased hip/knee flexion - right;Decreased hip/knee flexion - left;Right flexed knee in stance;Left flexed knee in stance    Exercises Other Exercises Other Exercises: demonstrates bil shoulder flexion to ~80 degrees s/p surg     PT Goals Acute Rehab PT Goals Pt will go Sit to Stand: with min assist PT Goal: Sit to Stand - Progress: Progressing toward goal Pt will go Stand to Sit: with min assist PT Goal: Stand to Sit - Progress: Progressing toward goal Pt will Ambulate: 1 - 15 feet;with rolling walker;with mod assist PT Goal: Ambulate - Progress: Progressing toward goal  Visit Information  Last PT Received On: 12/29/12 Assistance Needed: +2 PT/OT Co-Evaluation/Treatment: Yes    Subjective Data  Subjective: Rough today, now my right shoulder hurts.  But I will do what I can.  Really need to get back to taking care of myself.   Cognition  Cognition Arousal/Alertness: Awake/alert Behavior During Therapy: WFL for tasks assessed/performed Overall Cognitive Status: Within Functional Limits for tasks assessed    Balance  Static Sitting Balance Static Sitting - Balance Support: Bilateral upper extremity supported;Feet supported Static Sitting - Level of Assistance: 3: Mod assist Static Sitting - Comment/# of Minutes: with posterior bias  End of Session PT - End of Session Equipment Utilized During Treatment: Gait belt;Cervical collar Activity Tolerance:  Patient limited by pain Patient left:  in chair;with call bell/phone within reach   GP     [facility] 12/29/2012, 4:34 PM [place], [place] [phone] 12/29/2012

## 2012-12-29 NOTE — Clinical Social Work Placement (Addendum)
    Clinical Social Work Department CLINICAL SOCIAL WORK PLACEMENT NOTE 12/29/2012  Patient:  [name], [name]  Account Number:  [id number] Admit date:  12/25/2012  Clinical Social Worker:  Macario Golds, LCSW  Date/time:  12/29/2012 11:15 AM  Clinical Social Work is seeking post-discharge placement for this patient at the following level of care:   SKILLED NURSING   (*CSW will update this form in Epic as items are completed)   12/29/2012  Patient/family provided with [name] Department of Clinical Social Work's list of facilities offering this level of care within the geographic area requested by the patient (or if unable, by the patient's family).  12/29/2012  Patient/family informed of their freedom to choose among providers that offer the needed level of care, that participate in Medicare, Medicaid or managed care program needed by the patient, have an available bed and are willing to accept the patient.  12/29/2012  Patient/family informed of [facility]' ownership interest in [facility], as well as of the fact that they are under no obligation to receive care at this facility.  PASARR submitted to EDS on 12/29/2012 PASARR number received from EDS on 12/29/2012  FL2 transmitted to all facilities in geographic area requested by pt/family on  12/29/2012 FL2 transmitted to all facilities within larger geographic area on   Patient informed that his/her managed care company has contracts with or will negotiate with  certain facilities, including the following:     Patient/family informed of bed offers received:  12/31/2012 Patient chooses bed at [facility] Physician recommends and patient chooses bed at  SNF  Patient to be transferred to [facility] on 01/03/2013 Patient to be transferred to facility by [facility]  The following physician request were entered in Epic:   Additional Comments: 04/16 - patient and family agreeable to [facility] and [facility]  Dede Query, MSW, LCSW [phone]

## 2012-12-29 NOTE — Progress Notes (Signed)
Occupational Therapy Treatment Patient Details Name: [name] MRN: [id number] DOB: Jan 28, 1931 Today's Date: 12/29/2012 Time: 4098-1191 OT Time Calculation (min): 29 min  OT Assessment / Plan / Recommendation Comments on Treatment Session Pt demonstrates Rt shoulder weakness and severe pain.     Follow Up Recommendations  CIR    Barriers to Discharge       Equipment Recommendations  3 in 1 bedside comode    Recommendations for Other Services Rehab consult  Frequency Min 3X/week   Plan Discharge plan needs to be updated    Precautions / Restrictions Precautions Precautions: Cervical;Fall Precaution Comments: unable to recall cervical precautions Required Braces or Orthoses: Cervical Brace Cervical Brace: Soft collar   Pertinent Vitals/Pain 8 out 10 pain rt shoulder s/p surg    ADL  Eating/Feeding: Set up Where Assessed - Eating/Feeding: Chair (BIL UE positioned on pillows ) Grooming: Wash/dry face;Moderate assistance Where Assessed - Grooming: Supported sitting Lower Body Dressing: +1 Total assistance Where Assessed - Lower Body Dressing: Supported sit to Pharmacist, hospital: +2 Total assistance Toilet Transfer: Patient Percentage: 60% Toilet Transfer Method: Sit to Barista: Raised toilet seat with arms (or 3-in-1 over toilet) Equipment Used: Gait belt;Rolling walker Transfers/Ambulation Related to ADLs: Pt required Total +2 (A) and demonstrates BIL LE weakness. Pt with knee buckling and needed (A) to decr speed with facilitation for upright static posture ADL Comments: Pt sitting in chair on arrival.     OT Diagnosis:    OT Problem List:   OT Treatment Interventions:     OT Goals Acute Rehab OT Goals OT Goal Formulation: With patient Time For Goal Achievement: 01/12/13 Potential to Achieve Goals: Good ADL Goals Pt Will Perform Eating: with set-up;with supervision;Sitting, chair;Supported;with assist to don/doff  brace/orthosis;with adaptive utensils;with cueing (comment type and amount) ADL Goal: Eating - Progress: Progressing toward goals Pt Will Perform Grooming: with min assist;with caregiver independent in assisting;Sitting, chair;Supported;with adaptive equipment;with cueing (comment type and amount) ADL Goal: Grooming - Progress: Progressing toward goals Pt Will Perform Upper Body Bathing: with min assist;Sitting, chair;Supported;with adaptive equipment Pt Will Transfer to Toilet: with min assist;Squat pivot transfer;with DME;3-in-1;Maintaining back safety precautions Pt Will Perform Toileting - Hygiene: with min assist;Leaning right and/or left on 3-in-1/toilet Additional ADL Goal #1: Pt will maintain sitting balance EOB x 10 mini with SBA in preparation for ADL retarining Arm Goals Pt Will Complete Theraputty Exer: with supervision, verbal cues required/provided;to increase strength;Bilateral upper extremities;Min resistance putty Additional Arm Goal #1: Pt/family will complete BUE strengthening HEP to increase funcitonal use B hands.  Visit Information  Last OT Received On: 12/29/12 Assistance Needed: +2 PT/OT Co-Evaluation/Treatment: Yes    Subjective Data      Prior Functioning       Cognition  Cognition Arousal/Alertness: Awake/alert Behavior During Therapy: WFL for tasks assessed/performed Overall Cognitive Status: Within Functional Limits for tasks assessed    Mobility  Bed Mobility Bed Mobility: Not assessed Transfers Sit to Stand: 1: +2 Total assist;With upper extremity assist;From chair/3-in-1 Sit to Stand: Patient Percentage: 60% Stand to Sit: 1: +2 Total assist;With upper extremity assist;To chair/3-in-1 Stand to Sit: Patient Percentage: 60% Details for Transfer Assistance: educated on scooting to edge of chair with max (A) of pad on Left side    Exercises  Other Exercises Other Exercises: demonstrates bil shoulder flexion to ~80 degrees s/p surg   Balance      End of Session OT - End of Session Activity Tolerance: Patient tolerated treatment well  Patient left: in chair;with call bell/phone within reach Nurse Communication: Mobility status;Precautions  GO     Lucile Shutters 12/29/2012, 2:58 PM Pager: [phone]

## 2012-12-30 LAB — BASIC METABOLIC PANEL
BUN: 46 mg/dL — ABNORMAL HIGH (ref 6–23)
Calcium: 9.4 mg/dL (ref 8.4–10.5)
Chloride: 106 mEq/L (ref 96–112)
Creatinine, Ser: 1.06 mg/dL (ref 0.50–1.10)
GFR calc Af Amer: 56 mL/min — ABNORMAL LOW (ref >90)
GFR calc non Af Amer: 49 mL/min — ABNORMAL LOW (ref >90)

## 2012-12-30 LAB — CK: Total CK: 807 U/L — ABNORMAL HIGH (ref 7–177)

## 2012-12-30 MED ORDER — HYDRALAZINE HCL 20 MG/ML IJ SOLN
10.0000 mg | Freq: Four times a day (QID) | INTRAMUSCULAR | Status: DC | PRN
  Administered 2012-12-30 – 2013-01-03 (×3): 10 mg via INTRAVENOUS
  Filled 2012-12-30 (×3): qty 1

## 2012-12-30 NOTE — Progress Notes (Signed)
[facility] Progress Note [place] TEAM 1 - Stepdown/ICU TEAM   [name] ZOX:[id number] DOB: Sep 30, 1930 DOA: 12/25/2012 PCP: [name], MD  Brief narrative: 79 y.o. female with history of HTN, presumed diastolic chf, severe spinal stenosis, osteoporosis, who suffered a fall at 2 AM - she fell face down and landed on her abdomen, crawled to the life alert system and pushed the button. EMS arrived but she refused to come to the hospital. She later started developing left hip pain, and family also noted some left hand weakness and left leg weakness. She finally agreed to come to the hospital and was found to have A C2 and C 5 spine fracture. She was seen by Dr. Danielle Dess from neurosurgery and he did no recommend surgical management. Patient was placed on C collar.   Assessment/Plan:  Cervical spine fracture after fall from bed  Care as per neurosurgery - status post surgical stabilization 12/28/2012  Rhabdomyolysis due to fall - cont to hydrate - watch trend until essentially resolved  Nasal swelling No fracture on x-ray   Hypertension with history of diastolic CHF BP poorly controlled - likely some element of pain contributing, as well as dexamethasone - adjust medical treatment and follow trend. Add PRN hydralazine.  Osteoporosis with history of Spinal stenosis of lumbar region / chronic back pain  Patient has been living at home, ambulates with a walker but fell out of bed resulting in cervical spine fracture - PT/OT at discretion of neurosurgery   Hip pain- LEFT  CT of the hip done - no fractures   AKI (acute kidney injury) improved with IV fluid hydration .  Hyponatremia resolved   Code Status: FULL Family Communication: Son and daughter at bedisde Disposition Plan: PT/OT have recommended CIR, however patient's family is leaning more towards SNF.  Consultants: Neruosurgery  Procedures: 4/15 - Decompressive laminectomy C3 C4-C5 and C6, segmental  fixation with facet screws C3-C4 C5-C6 posterior lateral arthrodesis with autograft and allograft C3-C6  Antibiotics: None  DVT prophylaxis: SCDs  HPI/Subjective: The patient is awake and alert.  She is quite pleasant.  She denies chest pain shortness of breath fevers or chills.  She denies focal neurologic deficits.  She does report pain in her face and neck.  Objective: Blood pressure 181/80, pulse 63, temperature 97.5 F (36.4 C), temperature source Oral, resp. rate 21, height 5\' 2"  (1.575 m), weight 82.5 kg (181 lb 14.1 oz), SpO2 96.00%.  Intake/Output Summary (Last 24 hours) at 12/30/12 1439 Last data filed at 12/30/12 1300  Gross per 24 hour  Intake 633.75 ml  Output   5230 ml  Net -4596.25 ml     Exam: General: No acute respiratory distress Lungs: Clear to auscultation bilaterally without wheezes or crackles Cardiovascular: Regular rate and rhythm without murmur gallop or rub  Abdomen: Nontender, nondistended, soft, bowel sounds positive, no rebound, no ascites, no appreciable mass Extremities: No significant cyanosis, clubbing, or edema bilateral lower extremities  Data Reviewed: Basic Metabolic Panel:  Recent Labs Lab 12/25/12 1221 12/26/12 0700 12/28/12 0505 12/29/12 0512 12/30/12 0259  NA 130* 137 138 141 137  K 4.3 3.9 4.8 5.3* 5.2*  CL 94* 100 105 109 106  CO2 27 26 26 25 26   GLUCOSE 93 84 134* 133* 132*  BUN 50* 40* 43* 50* 46*  CREATININE 1.56* 1.22* 1.05 1.26* 1.06  CALCIUM 9.5 9.6 9.9 9.5 9.4   CBC:  Recent Labs Lab 12/25/12 1221 12/26/12 0700 12/28/12 0505 12/29/12 0512  WBC  7.5 7.9 13.1* 15.3*  NEUTROABS 5.2  --   --   --   HGB 10.4* 10.8* 11.1* 10.2*  HCT 30.2* 31.9* 32.1* 30.1*  MCV 91.0 91.1 90.4 92.0  PLT 208 226 247 239   Cardiac Enzymes:  Recent Labs Lab 12/25/12 1524 12/25/12 1820 12/28/12 0505 12/30/12 0259  CKTOTAL  --  2344* 2989* 807*  TROPONINI <0.30  --   --   --    CBG:  Recent Labs Lab 12/28/12 1125   GLUCAP 116*    Recent Results (from the past 240 hour(s))  MRSA PCR SCREENING     Status: None   Collection Time    12/28/12  8:01 AM      Result Value Range Status   MRSA by PCR NEGATIVE  NEGATIVE Final   Comment:            The GeneXpert MRSA Assay (FDA     approved for NASAL specimens     only), is one component of a     comprehensive MRSA colonization     surveillance program. It is not     intended to diagnose MRSA     infection nor to guide or     monitor treatment for     MRSA infections.     Studies:  Recent x-ray studies have been reviewed in detail by the Attending Physician  Scheduled Meds:  Scheduled Meds: . calcium carbonate  1 tablet Oral TID WC  . dexamethasone  4 mg Intravenous Q6H  . metoprolol  50 mg Oral BID  . multivitamin with minerals  1 tablet Oral Daily  . polyethylene glycol  17 g Oral Daily  . pramipexole  0.25 mg Oral QHS  . senna-docusate  1 tablet Oral BID  . simvastatin  10 mg Oral q1800   Continuous Infusions: . sodium chloride 25 mL/hr at 12/30/12 1200    Time spent on care of this patient:   Chaya Jan Pager: [phone] [facility] Office  [phone] Pager - Text Page per Loretha Stapler as per below:  On-Call/Text Page:      [email or web address]      password [email or web address]  If 7PM-7AM, please contact night-coverage [email or web address] Password [email or web address] 12/30/2012, 2:39 PM   LOS: 5 days

## 2012-12-30 NOTE — Progress Notes (Signed)
Occupational Therapy Treatment Patient Details Name: [name] MRN: [id number] DOB: May 06, 1931 Today's Date: 12/30/2012 Time: 7253-6644 OT Time Calculation (min): 37 min  OT Assessment / Plan / Recommendation Comments on Treatment Session Pt demonstrates bed mobility this session and evaluation of Lt hand shows 4th digit with deficits. Pt appears to have MCP subluxation. Pt provided red foam and double handle mug     Follow Up Recommendations  CIR    Barriers to Discharge       Equipment Recommendations  3 in 1 bedside comode    Recommendations for Other Services Rehab consult  Frequency Min 3X/week   Plan Discharge plan needs to be updated    Precautions / Restrictions Precautions Precautions: Cervical;Fall Required Braces or Orthoses: Cervical Brace Cervical Brace: Soft collar   Pertinent Vitals/Pain Pain in bil LE due to muscle spasms reported Pt states "I feel like I have paper in my hands but I know I dont"    ADL  Eating/Feeding: Set up Where Assessed - Eating/Feeding: Chair (BIL UE supported with pillow/ provided bil handle mug) Toilet Transfer: +2 Total assistance Toilet Transfer: Patient Percentage: 60% Toilet Transfer Method: Stand pivot Toilet Transfer Equipment: Raised toilet seat with arms (or 3-in-1 over toilet) Equipment Used: Gait belt;Rolling walker Transfers/Ambulation Related to ADLs: Pt sitting at EOB and needing (A) to slide back on bed surface due to BIL LE extension. PT needed cues and facilitation for knee flexion at EOB> Pt sit<>stand with knee hyperextension. Pt with flexed posture and needing facilitation for hip extension. Therapist standing behind patient to use therapist hip extension to help upright postuer with second therapist helping guide lines / walker and verbal cues to progress ADL Comments: Pt currently with Lt hand 4th digit MCP subluxation. OT stabilization of MCP with hand over hand allows patient with incr grasp LT hand. Pt  demonstrates Lt 4th digit PIP subluxatoin movement with MCP stabilzed. OT to continue to follow for possible splint needs. Pt could benefit from [facility] custom made splint to block MCP . Ot to further assess and request splint order if appropriate with further evaluation.     OT Diagnosis:    OT Problem List:   OT Treatment Interventions:     OT Goals Acute Rehab OT Goals OT Goal Formulation: With patient Time For Goal Achievement: 01/12/13 Potential to Achieve Goals: Good ADL Goals Pt Will Perform Eating: with set-up;with supervision;Sitting, chair;Supported;with assist to don/doff brace/orthosis;with adaptive utensils;with cueing (comment type and amount) ADL Goal: Eating - Progress: Progressing toward goals Pt Will Perform Grooming: with min assist;with caregiver independent in assisting;Sitting, chair;Supported;with adaptive equipment;with cueing (comment type and amount) ADL Goal: Grooming - Progress: Progressing toward goals Pt Will Perform Upper Body Bathing: with min assist;Sitting, chair;Supported;with adaptive equipment Pt Will Transfer to Toilet: with min assist;Squat pivot transfer;with DME;3-in-1;Maintaining back safety precautions ADL Goal: Statistician - Progress: Progressing toward goals Pt Will Perform Toileting - Hygiene: with min assist;Leaning right and/or left on 3-in-1/toilet Additional ADL Goal #1: Pt will maintain sitting balance EOB x 10 mini with SBA in preparation for ADL retarining ADL Goal: Additional Goal #1 - Progress: Progressing toward goals Arm Goals Pt Will Complete Theraputty Exer: with supervision, verbal cues required/provided;to increase strength;Bilateral upper extremities;Min resistance putty Additional Arm Goal #1: Pt/family will complete BUE strengthening HEP to increase funcitonal use B hands.  Visit Information  Last OT Received On: 12/30/12 Assistance Needed: +2 PT/OT Co-Evaluation/Treatment: Yes    Subjective Data      Prior  Functioning       Cognition  Cognition Arousal/Alertness: Awake/alert Behavior During Therapy: WFL for tasks assessed/performed Overall Cognitive Status: Within Functional Limits for tasks assessed    Mobility  Bed Mobility Bed Mobility: Rolling Right;Right Sidelying to Sit Rolling Left: 2: Max assist Right Sidelying to Sit: 1: +2 Total assist;HOB flat Right Sidelying to Sit: Patient Percentage: 30% Sitting - Scoot to Edge of Bed: 3: Mod assist Details for Bed Mobility Assistance: pt fearful due to close to edge of bed lying on side and not in position to use rail to assist herself up Transfers Sit to Stand: 1: +1 Total assist;From bed;With upper extremity assist Sit to Stand: Patient Percentage: 40% Stand to Sit: To chair/3-in-1;With upper extremity assist;1: +2 Total assist Stand to Sit: Patient Percentage: 60% Details for Transfer Assistance: needed cues and assist for safety stand to sit due to attempting to sit too soon and chair not locked and unable to get legs closer safely.  Needed max cues and assist for anterior weight shift to stand     Exercises   Other Exercises Other Exercises: Pts daughter took theraputty home and plans to return items to [facility]. Currently OT department does not have putty to replace the putty at home to work toward goals. Ot to await return of necessary items to progress patient toward goals   Balance Static Sitting Balance Static Sitting - Balance Support: Bilateral upper extremity supported;Feet supported Static Sitting - Level of Assistance: 4: Min assist;5: Stand by assistance Static Sitting - Comment/# of Minutes: leaning posterior, feet scooting out into extension   End of Session OT - End of Session Activity Tolerance: Patient tolerated treatment well Patient left: in chair;with call bell/phone within reach Nurse Communication: Mobility status;Precautions  GO     Lucile Shutters 12/30/2012, 1:40 PM Pager: [phone]

## 2012-12-30 NOTE — Clinical Social Work Note (Signed)
Clinical Social Worker met with patient family at bedside to further discuss patient discharge options.  Patient daughter states that inpatient rehab would be wonderful, however patient family fears that they will not be able to provide enough care at discharge.  Patient family has requested [facility] as first choice and [facility] as second choice.  CSW to initiate insurance authorization through [facility] and continue to communicate with facilities to provide patient and family with bed offers.  CSW remains available for support and to facilitate patient discharge needs once medically ready.  Macario Golds, [place] [phone]

## 2012-12-30 NOTE — Progress Notes (Signed)
Subjective: Patient reports Less pain in neck and shoulders since drain removed  Objective: Vital signs in last 24 hours: Temp:  [97.5 F (36.4 C)-98.5 F (36.9 C)] 98.5 F (36.9 C) (04/17 1959) Pulse Rate:  [58-85] 85 (04/17 1900) Resp:  [11-24] 20 (04/17 1900) BP: (141-189)/(51-155) 173/56 mmHg (04/17 1900) SpO2:  [93 %-99 %] 97 % (04/17 1900)  Intake/Output from previous day: 04/16 0701 - 04/17 0700 In: 1336.8 [I.V.:836.8; IV Piggyback:500] Out: 4380 [Urine:4300; Drains:80] Intake/Output this shift: Total I/O In: 25 [I.V.:25] Out: -   Motor function remained stable with persistent decreased grip on the left than on the right right-sided grip 4/5 left side grip 3/5  Lab Results:  Recent Labs  12/28/12 0505 12/29/12 0512  WBC 13.1* 15.3*  HGB 11.1* 10.2*  HCT 32.1* 30.1*  PLT 247 239   BMET  Recent Labs  12/29/12 0512 12/30/12 0259  NA 141 137  K 5.3* 5.2*  CL 109 106  CO2 25 26  GLUCOSE 133* 132*  BUN 50* 46*  CREATININE 1.26* 1.06  CALCIUM 9.5 9.4    Studies/Results: No results found.  Assessment/Plan: Stable postop day 2 improving level of the  LOS: 5 days  Transferred to 4 N.   [name] J 12/30/2012, 8:00 PM

## 2012-12-30 NOTE — Plan of Care (Signed)
Problem: Consults Goal: Diagnosis - Spinal Surgery Outcome: Completed/Met Date Met:  12/30/12 Cervical Spine Fusion

## 2012-12-30 NOTE — Progress Notes (Signed)
Met with patient at bedside with her daughter present to discuss possible CIR admission. Explained to them that [facility] will approve for pt to go to CIR or to SNF but not both. They expressed concerns that pt will need a longer rehab program than CIR. The patient has been to [facility] CIR before and reports that she does not think she could tolerate the intensity of it at this time. They are interested in pursuing a bed at [facility] and requested to talk with the CSW. Have informed pt's CSW. Will sign off. Please call for questions: [phone].

## 2012-12-30 NOTE — Progress Notes (Signed)
Physical Therapy Treatment Patient Details Name: [name] MRN: [id number] DOB: [birth year] Today's Date: 12/30/2012 Time: 0915-0955 PT Time Calculation (min): 40 min  PT Assessment / Plan / Recommendation Comments on Treatment Session  Patient demonstrates continued posteior bias, some fear factor more evident today with both standing activities and rolling to sidelying.  She c/o more LE pain today than shoulder pain.  Feel will need extensive rehab, but due to her level of motivation still feel she is good CIR candidate.    Follow Up Recommendations  CIR     Does the patient have the potential to tolerate intense rehabilitation   yes  Barriers to Discharge  decreased caregiver support      Equipment Recommendations  None recommended by PT    Recommendations for Other Services  None  Frequency Min 5X/week   Plan Discharge plan remains appropriate    Precautions / Restrictions Precautions Precautions: Cervical;Fall Required Braces or Orthoses: Cervical Brace Cervical Brace: Soft collar   Pertinent Vitals/Pain C/o LE pain with spasms, RN had given meds prior to session    Mobility  Bed Mobility Bed Mobility: Rolling Right;Right Sidelying to Sit Rolling Left: 2: Max assist Right Sidelying to Sit: 1: +2 Total assist;HOB flat Right Sidelying to Sit: Patient Percentage: 30% Sitting - Scoot to Edge of Bed: 3: Mod assist Details for Bed Mobility Assistance: pt fearful due to close to edge of bed lying on side and not in position to use rail to assist herself up Transfers Sit to Stand: 1: +1 Total assist;From bed;With upper extremity assist Sit to Stand: Patient Percentage: 40% Stand to Sit: To chair/3-in-1;With upper extremity assist;1: +2 Total assist Stand to Sit: Patient Percentage: 60% Stand Pivot Transfers: 1: +2 Total assist Stand Pivot Transfers: Patient Percentage: 60% Details for Transfer Assistance: needed cues and assist for safety stand to sit due to  attempting to sit too soon and chair not locked and unable to get legs closer safely.  Needed max cues and assist for anterior weight shift to stand  Ambulation/Gait Ambulation/Gait Assistance: 2: Max assist Ambulation Distance (Feet): 3 Feet Assistive device: Rolling walker Ambulation/Gait Assistance Details: assist to keep walker close while stepping bed to chair and cues for balance, tends to keep feet close to walker and lean posterior. Gait Pattern: Step-to pattern;Shuffle;Decreased hip/knee flexion - right;Decreased hip/knee flexion - left General Gait Details: needs max instructional cues due to anxiety, moving fast and fear of falling    Exercises General Exercises - Upper Extremity Shoulder Flexion: AAROM;Both;10 reps;Seated General Exercises - Lower Extremity Long Arc Quad: AROM;Both;10 reps;Seated Hip Flexion/Marching: AAROM;Both;10 reps;Seated Toe Raises: AROM;Both;10 reps;Seated Heel Raises: AROM;Both;10 reps;Seated    PT Goals Acute Rehab PT Goals Pt will go Supine/Side to Sit: with min assist PT Goal: Supine/Side to Sit - Progress: Progressing toward goal Pt will go Sit to Stand: with min assist PT Goal: Sit to Stand - Progress: Progressing toward goal Pt will go Stand to Sit: with min assist PT Goal: Stand to Sit - Progress: Progressing toward goal Pt will Transfer Bed to Chair/Chair to Bed: with min assist PT Transfer Goal: Bed to Chair/Chair to Bed - Progress: Progressing toward goal Pt will Ambulate: 1 - 15 feet;with rolling walker;with mod assist PT Goal: Ambulate - Progress: Progressing toward goal  Visit Information  Last PT Received On: 12/30/12    Subjective Data  Subjective: Lots of spasms in my legs   Cognition  Cognition Arousal/Alertness: Awake/alert Behavior During Therapy: [facility] for tasks  assessed/performed Overall Cognitive Status: Within Functional Limits for tasks assessed    Balance  Static Sitting Balance Static Sitting - Balance Support:  Bilateral upper extremity supported;Feet supported Static Sitting - Level of Assistance: 4: Min assist;5: Stand by assistance Static Sitting - Comment/# of Minutes: leaning posterior, feet scooting out into extension  End of Session PT - End of Session Equipment Utilized During Treatment: Gait belt;Cervical collar Activity Tolerance: Patient limited by pain;Other (comment) (limited due to fear) Patient left: in chair;with family/visitor present;with call bell/phone within reach Nurse Communication: Mobility status   GP     [facility] 12/30/2012, 1:08 PM [place], [place] [phone] 12/30/2012

## 2012-12-31 LAB — CK: Total CK: 310 U/L — ABNORMAL HIGH (ref 7–177)

## 2012-12-31 NOTE — Progress Notes (Signed)
Physical Therapy Treatment Patient Details Name: [name] MRN: [id number] DOB: 1930-10-20 Today's Date: 12/31/2012 Time: 4098-1191 PT Time Calculation (min): 38 min  PT Assessment / Plan / Recommendation Comments on Treatment Session  Pt adm with C2 fx and central cord injury and underwent multilevel cervical fusion.  Pt making slow progress.  Pt and family feel pt needs extended rehab time provide at SNF.  Agree with plan for SNF.    Follow Up Recommendations  SNF     Does the patient have the potential to tolerate intense rehabilitation     Barriers to Discharge        Equipment Recommendations  None recommended by PT    Recommendations for Other Services    Frequency Min 3X/week   Plan Discharge plan needs to be updated;Frequency needs to be updated    Precautions / Restrictions Precautions Precautions: Cervical;Fall Required Braces or Orthoses: Cervical Brace Cervical Brace: Soft collar   Pertinent Vitals/Pain No c/o's    Mobility  Bed Mobility Rolling Right: 2: Max assist Right Sidelying to Sit: 1: +2 Total assist;HOB flat Right Sidelying to Sit: Patient Percentage: 30% Sitting - Scoot to Edge of Bed: 2: Max assist Details for Bed Mobility Assistance: Verbal cues for technique and assist to bring legs off edge and raise trunk up.  Used bed pad to bring hips to EOB. Transfers Sit to Stand: 1: +2 Total assist;With upper extremity assist;From bed;With armrests;From chair/3-in-1 Sit to Stand: Patient Percentage: 40% Stand to Sit: 1: +2 Total assist;With upper extremity assist;To chair/3-in-1 Stand to Sit: Patient Percentage: 50% Details for Transfer Assistance: Verbal/tactile cues for technique including placement of hands, scooting toward edge of chair, and taking head out over knees. Performed sit to stand x 5. Ambulation/Gait Ambulation/Gait Assistance: 1: +2 Total assist Ambulation/Gait: Patient Percentage: 50% Ambulation Distance (Feet): 4 Feet Assistive  device: Rolling walker Ambulation/Gait Assistance Details: Verbal cues to not step past walker with feet and to stand more erect. Also verbal cues to widen base of support (feet touching each other). Gait Pattern: Step-to pattern;Decreased step length - right;Decreased step length - left;Decreased hip/knee flexion - right;Decreased hip/knee flexion - left;Shuffle;Trunk flexed;Right flexed knee in stance;Left flexed knee in stance;Narrow base of support Gait velocity: decreased General Gait Details: No sign of significan fear or anxiety today.  Put pt's tennis shoes on for better stability and confidence.    Exercises     PT Diagnosis:    PT Problem List:   PT Treatment Interventions:     PT Goals Acute Rehab PT Goals PT Goal: Supine/Side to Sit - Progress: Progressing toward goal PT Goal: Sit to Stand - Progress: Progressing toward goal PT Goal: Stand to Sit - Progress: Progressing toward goal PT Transfer Goal: Bed to Chair/Chair to Bed - Progress: Progressing toward goal PT Goal: Ambulate - Progress: Progressing toward goal  Visit Information  Last PT Received On: 12/31/12 Assistance Needed: +2    Subjective Data  Subjective: Pt states she is planning to go to [facility] on Monday for further rehab.   Cognition  Cognition Arousal/Alertness: Awake/alert Behavior During Therapy: WFL for tasks assessed/performed Overall Cognitive Status: Within Functional Limits for tasks assessed    Balance  Static Sitting Balance Static Sitting - Balance Support: Bilateral upper extremity supported;Feet supported Static Sitting - Level of Assistance: 4: Min assist;5: Stand by Theatre manager Standing - Balance Support: Bilateral upper extremity supported Static Standing - Level of Assistance: 3: Mod assist Static Standing -  Comment/# of Minutes: Stood 5 times for 30 sec to 2 minutes.  Verbal cues to stand more erect.  End of Session PT - End of Session Equipment  Utilized During Treatment: Gait belt;Cervical collar Activity Tolerance: Patient tolerated treatment well Patient left: in chair;with call bell/phone within reach;with family/visitor present Nurse Communication: Mobility status   GP     Abbie Berling 12/31/2012, 3:14 PM  [facility] PT [phone]

## 2012-12-31 NOTE — Progress Notes (Signed)
[facility] Progress Note [place] TEAM 1 - Stepdown/ICU TEAM   [name] WUJ:[id number] DOB: 1931-05-03 DOA: 12/25/2012 PCP: [name], MD  Brief narrative: 79 y.o. female with history of HTN, presumed diastolic chf, severe spinal stenosis, osteoporosis, who suffered a fall at 2 AM - she fell face down and landed on her abdomen, crawled to the life alert system and pushed the button. EMS arrived but she refused to come to the hospital. She later started developing left hip pain, and family also noted some left hand weakness and left leg weakness. She finally agreed to come to the hospital and was found to have A C2 and C5 spine fracture. She was seen by Dr. Danielle Dess from neurosurgery and he did no recommend surgical management. Patient was placed on C collar.   Assessment/Plan:  Cervical spine fracture after fall from bed  Care as per neurosurgery - status post surgical stabilization 12/28/2012  Rhabdomyolysis due to fall - patient was hydrated - CK has now essentially normalized  Nasal swelling No fracture on x-ray - improving clinically  Hypertension with history of diastolic CHF BP control much improved - follow w/o change today  Osteoporosis with history of Spinal stenosis of lumbar region / chronic back pain  Patient has been living at home, ambulates with a walker but fell out of bed resulting in cervical spine fracture - PT/OT at discretion of neurosurgery   Hip pain- LEFT  CT of the hip done - no fractures   AKI (acute kidney injury) improved with IV fluid hydration - GFR approaching normal   Hyponatremia resolved   Code Status: FULL Family Communication:  No family present at time of visit Disposition Plan: SNF - likely on Monday   Consultants: Neruosurgery  Procedures: 4/15 - Decompressive laminectomy C3 C4-C5 and C6, segmental fixation with facet screws C3-C4 C5-C6 posterior lateral arthrodesis with autograft and allograft  C3-C6  Antibiotics: None  DVT prophylaxis: SCDs  HPI/Subjective: The patient is alert and oriented.  She has no complaints today.  She denies chest pain shortness of breath fevers or chills.  Objective: Blood pressure 145/54, pulse 97, temperature 97.4 F (36.3 C), temperature source Oral, resp. rate 23, height 5\' 2"  (1.575 m), weight 82.5 kg (181 lb 14.1 oz), SpO2 97.00%.  Intake/Output Summary (Last 24 hours) at 12/31/12 1624 Last data filed at 12/31/12 0900  Gross per 24 hour  Intake    375 ml  Output   2350 ml  Net  -1975 ml     Exam: General: No acute respiratory distress Lungs: Clear to auscultation bilaterally without wheezes or crackles Cardiovascular: Regular rate and rhythm without murmur gallop or rub  Abdomen: Nontender, nondistended, soft, bowel sounds positive, no rebound, no ascites, no appreciable mass Extremities: No significant cyanosis, clubbing, edema bilateral lower extremities  Data Reviewed: Basic Metabolic Panel:  Recent Labs Lab 12/25/12 1221 12/26/12 0700 12/28/12 0505 12/29/12 0512 12/30/12 0259  NA 130* 137 138 141 137  K 4.3 3.9 4.8 5.3* 5.2*  CL 94* 100 105 109 106  CO2 27 26 26 25 26   GLUCOSE 93 84 134* 133* 132*  BUN 50* 40* 43* 50* 46*  CREATININE 1.56* 1.22* 1.05 1.26* 1.06  CALCIUM 9.5 9.6 9.9 9.5 9.4   CBC:  Recent Labs Lab 12/25/12 1221 12/26/12 0700 12/28/12 0505 12/29/12 0512  WBC 7.5 7.9 13.1* 15.3*  NEUTROABS 5.2  --   --   --   HGB 10.4* 10.8* 11.1* 10.2*  HCT 30.2*  31.9* 32.1* 30.1*  MCV 91.0 91.1 90.4 92.0  PLT 208 226 247 239   Cardiac Enzymes:  Recent Labs Lab 12/25/12 1524 12/25/12 1820 12/28/12 0505 12/30/12 0259 12/31/12 0410  CKTOTAL  --  2344* 2989* 807* 310*  TROPONINI <0.30  --   --   --   --    CBG:  Recent Labs Lab 12/28/12 1125  GLUCAP 116*    Recent Results (from the past 240 hour(s))  MRSA PCR SCREENING     Status: None   Collection Time    12/28/12  8:01 AM      Result  Value Range Status   MRSA by PCR NEGATIVE  NEGATIVE Final   Comment:            The GeneXpert MRSA Assay (FDA     approved for NASAL specimens     only), is one component of a     comprehensive MRSA colonization     surveillance program. It is not     intended to diagnose MRSA     infection nor to guide or     monitor treatment for     MRSA infections.     Studies:  Recent x-ray studies have been reviewed in detail by the Attending Physician  Scheduled Meds:  Scheduled Meds: . calcium carbonate  1 tablet Oral TID WC  . dexamethasone  4 mg Intravenous Q6H  . metoprolol  50 mg Oral BID  . multivitamin with minerals  1 tablet Oral Daily  . polyethylene glycol  17 g Oral Daily  . pramipexole  0.25 mg Oral QHS  . senna-docusate  1 tablet Oral BID  . simvastatin  10 mg Oral q1800   Continuous Infusions: . sodium chloride 25 mL/hr at 12/31/12 1339    Time spent on care of this patient:   [facility] T Pager: [phone] [facility] Office  [phone] Pager - Text Page per Loretha Stapler as per below:  On-Call/Text Page:      [email or web address]      password [email or web address]  If 7PM-7AM, please contact night-coverage [email or web address] Password [email or web address] 12/31/2012, 4:24 PM   LOS: 6 days

## 2012-12-31 NOTE — Progress Notes (Signed)
Occupational Therapy Treatment Patient Details Name: [name] MRN: [id number] DOB: Oct 15, 1930 Today's Date: 12/31/2012 Time: 7829-5621 OT Time Calculation (min): 25 min  OT Assessment / Plan / Recommendation Comments on Treatment Session  Pt seen to asses for splinting needs for Lt. Hand.  Extension of fourth digit significantly improved.  She now demonstrates a 20* extensor lag at best, and ~60* when fatigued.  Pt has been independently performing exercises.  Pt may benefit from modification to walker grips, and or wrist splints due to h/o carpal tunnel.    Follow Up Recommendations  SNF    Barriers to Discharge       Equipment Recommendations  None recommended by OT    Recommendations for Other Services    Frequency Min 3X/week   Plan Discharge plan needs to be updated    Precautions / Restrictions Precautions Precautions: Cervical;Fall Required Braces or Orthoses: Cervical Brace Cervical Brace: Soft collar Restrictions Weight Bearing Restrictions: No   Pertinent Vitals/Pain     ADL  ADL Comments: Pt and dtr report Lt hand improved today.  She now demonstrates at best ~20* extensor lag.  As she fatigues lag increases to 40-60*.  She performed 2 sets 10 reps composite extension of all digits and digit 4 isolated.  She then performed 10 reps each blocking exercises each joint digit 4.  10 reps abduction/adduction.  Pt reports not pain in hand, only "stiffness".  Pt is performing theraputty exercises independently.   Pt with significant carpal tunnel both hands with wasting of the dorsal web space.  Walker grips may need to be modified vs. use of wrist splints    OT Diagnosis:    OT Problem List:   OT Treatment Interventions:     OT Goals Acute Rehab OT Goals OT Goal Formulation: With patient Time For Goal Achievement: 01/12/13 Potential to Achieve Goals: Good Arm Goals Arm Goal: Theraputty Exercises - Progress: Progressing toward goal Arm Goal: Additional Goal #1  - Progress: Progressing toward goals  Visit Information  Last OT Received On: 12/31/12 Assistance Needed: +2    Subjective Data      Prior Functioning       Cognition  Cognition Arousal/Alertness: Awake/alert Behavior During Therapy: WFL for tasks assessed/performed Overall Cognitive Status: Within Functional Limits for tasks assessed    Mobility  Bed Mobility Rolling Right: 3: Mod assist;With rail Rolling Left: 3: Mod assist;With rail Right Sidelying to Sit: 1: +2 Total assist;HOB flat Right Sidelying to Sit: Patient Percentage: 30% Sitting - Scoot to Edge of Bed: 2: Max assist Details for Bed Mobility Assistance: Pt on bedpan and assisted off of it.  Requires cues/assist to grab rails Transfers Sit to Stand: 1: +2 Total assist;With upper extremity assist;From bed;With armrests;From chair/3-in-1 Sit to Stand: Patient Percentage: 40% Stand to Sit: 1: +2 Total assist;With upper extremity assist;To chair/3-in-1 Stand to Sit: Patient Percentage: 50% Details for Transfer Assistance: Verbal/tactile cues for technique including placement of hands, scooting toward edge of chair, and taking head out over knees. Performed sit to stand x 5.    Exercises      Balance Static Sitting Balance Static Sitting - Balance Support: Bilateral upper extremity supported;Feet supported Static Sitting - Level of Assistance: 4: Min assist;5: Stand by assistance Static Standing Balance Static Standing - Balance Support: Bilateral upper extremity supported Static Standing - Level of Assistance: 3: Mod assist Static Standing - Comment/# of Minutes: Stood 5 times for 30 sec to 2 minutes.  Verbal cues to stand  more erect.   End of Session OT - End of Session Equipment Utilized During Treatment: Cervical collar Activity Tolerance: Patient tolerated treatment well Patient left: in bed;with call bell/phone within reach;with family/visitor present  GO     Cherril Hett, Ursula Alert M 12/31/2012, 5:03 PM

## 2012-12-31 NOTE — Clinical Social Work Note (Signed)
Clinical Social Worker continuing to follow patient for support and discharge planning needs.  CSW spoke with [facility] who is able to extend the bed offer and accept patient on Monday 01/03/13 due to insurance barriers.  Patient family plans to complete paperwork with liaison today at the hospital.  CSW has submitted clinicals to [facility], but due to them being closed for the holiday, no approval can be obtained until Monday.  CSW remains available for support and to facilitate patient discharge needs once medically ready.  Macario Golds, [place] [phone]

## 2012-12-31 NOTE — Progress Notes (Signed)
Subjective: Patient reports Continues to feel the pain is improving. She does not note significant change in motor function of her hands  Objective: Vital signs in last 24 hours: Temp:  [97.4 F (36.3 C)-98.5 F (36.9 C)] 97.4 F (36.3 C) (04/18 0800) Pulse Rate:  [55-97] 97 (04/18 0900) Resp:  [10-23] 23 (04/18 0900) BP: (141-193)/(52-91) 145/54 mmHg (04/18 0900) SpO2:  [95 %-99 %] 97 % (04/18 0900)  Intake/Output from previous day: 04/17 0701 - 04/18 0700 In: 550 [I.V.:550] Out: 3625 [Urine:3625] Intake/Output this shift: Total I/O In: 50 [I.V.:50] Out: -   Incision is clean and dry. Motor function remains 3/5 in the left and 4/5 in the right hand stiffness and clumsiness remaining. Lower extremity function appears 4-5 bilaterally  Lab Results:  Recent Labs  12/29/12 0512  WBC 15.3*  HGB 10.2*  HCT 30.1*  PLT 239   BMET  Recent Labs  12/29/12 0512 12/30/12 0259  NA 141 137  K 5.3* 5.2*  CL 109 106  CO2 25 26  GLUCOSE 133* 132*  BUN 50* 46*  CREATININE 1.26* 1.06  CALCIUM 9.5 9.4    Studies/Results: No results found.  Assessment/Plan: Stable postop  LOS: 6 days  Plan transfer to [facility] place on Monday   Eldene Plocher J 12/31/2012, 10:35 AM

## 2013-01-01 ENCOUNTER — Inpatient Hospital Stay (HOSPITAL_COMMUNITY): Payer: Medicare Other

## 2013-01-01 DIAGNOSIS — M810 Age-related osteoporosis without current pathological fracture: Secondary | ICD-10-CM

## 2013-01-01 DIAGNOSIS — IMO0002 Reserved for concepts with insufficient information to code with codable children: Secondary | ICD-10-CM

## 2013-01-01 MED ORDER — PNEUMOCOCCAL VAC POLYVALENT 25 MCG/0.5ML IJ INJ
0.5000 mL | INJECTION | INTRAMUSCULAR | Status: AC
  Administered 2013-01-02: 0.5 mL via INTRAMUSCULAR
  Filled 2013-01-01: qty 0.5

## 2013-01-01 MED ORDER — DEXAMETHASONE 4 MG PO TABS
4.0000 mg | ORAL_TABLET | Freq: Four times a day (QID) | ORAL | Status: DC
  Administered 2013-01-01 – 2013-01-03 (×10): 4 mg via ORAL
  Filled 2013-01-01 (×13): qty 1

## 2013-01-01 NOTE — Progress Notes (Addendum)
[facility] Progress Note [place] TEAM 1 - Stepdown/ICU TEAM   Nirvi A Rising NWG:[id number] DOB: February 18, 1931 DOA: 12/25/2012 PCP: [name], MD  Brief narrative: 79 y.o. female with history of HTN, presumed diastolic chf, severe spinal stenosis, osteoporosis, who suffered a fall at 2 AM - she fell face down and landed on her abdomen, crawled to the life alert system and pushed the button. EMS arrived but she refused to come to the hospital. She later started developing left hip pain, and family also noted some left hand weakness and left leg weakness. She finally agreed to come to the hospital and was found to have A C2 and C5 spine fracture. She was seen by Dr. Danielle Dess from neurosurgery and he did no recommend surgical management. Patient was placed on C collar.   Assessment/Plan:  Cervical spine fracture after fall from bed  Care as per neurosurgery - status post surgical stabilization 12/28/2012  Urinary retention Patient has urinary retention, unable to void, will continue with foley catheter Will need to follow up Urology as outpatient  Rhabdomyolysis due to fall - patient was hydrated - CK has now essentially normalized  Nasal swelling No fracture on x-ray - improving clinically  Hypertension with history of diastolic CHF BP control much improved - follow w/o change today  Osteoporosis with history of Spinal stenosis of lumbar region / chronic back pain  Patient has been living at home, ambulates with a walker but fell out of bed resulting in cervical spine fracture - PT/OT at discretion of neurosurgery   Hip pain- LEFT  CT of the hip done - no fractures   AKI (acute kidney injury) improved with IV fluid hydration - GFR approaching normal   Hyponatremia resolved   Foot pain Will obtain xray of the foot  Code Status: FULL Family Communication:  No family present at time of visit Disposition Plan: SNF - likely on Monday    Consultants: Neruosurgery  Procedures: 4/15 - Decompressive laminectomy C3 C4-C5 and C6, segmental fixation with facet screws C3-C4 C5-C6 posterior lateral arthrodesis with autograft and allograft C3-C6  Antibiotics: None  DVT prophylaxis: SCDs  HPI/Subjective: The patient is alert and oriented. She complains of pain in the right toe.  Objective: Blood pressure 151/61, pulse 66, temperature 97.2 F (36.2 C), temperature source Oral, resp. rate 18, height 5\' 2"  (1.575 m), weight 82.5 kg (181 lb 14.1 oz), SpO2 99.00%.  Intake/Output Summary (Last 24 hours) at 01/01/13 1405 Last data filed at 01/01/13 0700  Gross per 24 hour  Intake    120 ml  Output   2225 ml  Net  -2105 ml     Exam: General: appear in no acute distress Lungs: Clear to auscultation bilaterally without wheezes or crackles Cardiovascular: Regular rate and rhythm without murmur gallop or rub  Abdomen: Nontender, nondistended, soft, bowel sounds positive, Extremities: No edema in the lower extremities Foot: Right big toe is tender to palpation and has dried blood on the lateral side  Data Reviewed: Basic Metabolic Panel:  Recent Labs Lab 12/26/12 0700 12/28/12 0505 12/29/12 0512 12/30/12 0259  NA 137 138 141 137  K 3.9 4.8 5.3* 5.2*  CL 100 105 109 106  CO2 26 26 25 26   GLUCOSE 84 134* 133* 132*  BUN 40* 43* 50* 46*  CREATININE 1.22* 1.05 1.26* 1.06  CALCIUM 9.6 9.9 9.5 9.4   CBC:  Recent Labs Lab 12/26/12 0700 12/28/12 0505 12/29/12 0512  WBC 7.9 13.1* 15.3*  HGB 10.8*  11.1* 10.2*  HCT 31.9* 32.1* 30.1*  MCV 91.1 90.4 92.0  PLT 226 247 239   Cardiac Enzymes:  Recent Labs Lab 12/25/12 1524 12/25/12 1820 12/28/12 0505 12/30/12 0259 12/31/12 0410  CKTOTAL  --  2344* 2989* 807* 310*  TROPONINI <0.30  --   --   --   --    CBG:  Recent Labs Lab 12/28/12 1125  GLUCAP 116*    Recent Results (from the past 240 hour(s))  MRSA PCR SCREENING     Status: None   Collection  Time    12/28/12  8:01 AM      Result Value Range Status   MRSA by PCR NEGATIVE  NEGATIVE Final   Comment:            The GeneXpert MRSA Assay (FDA     approved for NASAL specimens     only), is one component of a     comprehensive MRSA colonization     surveillance program. It is not     intended to diagnose MRSA     infection nor to guide or     monitor treatment for     MRSA infections.       Scheduled Meds:  Scheduled Meds: . calcium carbonate  1 tablet Oral TID WC  . dexamethasone  4 mg Oral Q6H  . metoprolol  50 mg Oral BID  . multivitamin with minerals  1 tablet Oral Daily  . [START ON 01/02/2013] pneumococcal 23 valent vaccine  0.5 mL Intramuscular Tomorrow-1000  . polyethylene glycol  17 g Oral Daily  . pramipexole  0.25 mg Oral QHS  . senna-docusate  1 tablet Oral BID  . simvastatin  10 mg Oral q1800   Continuous Infusions: . sodium chloride 25 mL/hr at 12/31/12 1339    Time spent on care of this patient:   Oliviya Gilkison S [phone] If 7PM-7AM, please contact night-coverage [email or web address] Password [email or web address] 01/01/2013, 2:05 PM   LOS: 7 days

## 2013-01-01 NOTE — Progress Notes (Signed)
Subjective: Patient reports She's feeling better with improved strength in her arms and legs  Objective: Vital signs in last 24 hours: Temp:  [97.2 F (36.2 C)-98.2 F (36.8 C)] 98 F (36.7 C) (04/19 8295) Pulse Rate:  [62-97] 68 (04/19 0633) Resp:  [18-23] 18 (04/19 0633) BP: (133-160)/(48-99) 160/99 mmHg (04/19 0633) SpO2:  [97 %-98 %] 97 % (04/19 6213)  Intake/Output from previous day: 04/18 0701 - 04/19 0700 In: 170 [P.O.:120; I.V.:50] Out: 2225 [Urine:2225] Intake/Output this shift:    Strength is well over antigravity bilaterally wound is clean and dry  Lab Results: No results found for this basename: WBC, HGB, HCT, PLT,  in the last 72 hours BMET  Recent Labs  12/30/12 0259  NA 137  K 5.2*  CL 106  CO2 26  GLUCOSE 132*  BUN 46*  CREATININE 1.06  CALCIUM 9.4    Studies/Results: No results found.  Assessment/Plan: Continue to mobilize with therapy may transfer to [place] place when bed available. Dr. Danielle Dess to return tomorrow  LOS: 7 days    Jeson Camacho P 01/01/2013, 8:27 AM

## 2013-01-02 DIAGNOSIS — I2510 Atherosclerotic heart disease of native coronary artery without angina pectoris: Secondary | ICD-10-CM

## 2013-01-02 DIAGNOSIS — W19XXXA Unspecified fall, initial encounter: Secondary | ICD-10-CM

## 2013-01-02 LAB — BASIC METABOLIC PANEL
BUN: 36 mg/dL — ABNORMAL HIGH (ref 6–23)
Calcium: 9.1 mg/dL (ref 8.4–10.5)
Chloride: 103 mEq/L (ref 96–112)
Creatinine, Ser: 0.83 mg/dL (ref 0.50–1.10)
GFR calc Af Amer: 76 mL/min — ABNORMAL LOW (ref >90)

## 2013-01-02 MED ORDER — LISINOPRIL 20 MG PO TABS
20.0000 mg | ORAL_TABLET | Freq: Every day | ORAL | Status: DC
  Administered 2013-01-02 – 2013-01-03 (×2): 20 mg via ORAL
  Filled 2013-01-02 (×3): qty 1

## 2013-01-02 MED ORDER — FUROSEMIDE 20 MG PO TABS: 20.0000 mg | ORAL_TABLET | Freq: Every day | ORAL | Status: DC

## 2013-01-02 MED ORDER — HYDROCODONE-ACETAMINOPHEN 10-325 MG PO TABS: 1.0000 | ORAL_TABLET | Freq: Four times a day (QID) | ORAL | Status: DC | PRN

## 2013-01-02 MED ORDER — PANTOPRAZOLE SODIUM 40 MG PO TBEC
40.0000 mg | DELAYED_RELEASE_TABLET | Freq: Every day | ORAL | Status: DC
  Administered 2013-01-02 – 2013-01-03 (×2): 40 mg via ORAL
  Filled 2013-01-02 (×2): qty 1

## 2013-01-02 MED ORDER — SENNOSIDES-DOCUSATE SODIUM 8.6-50 MG PO TABS: 1.0000 | ORAL_TABLET | Freq: Two times a day (BID) | ORAL | Status: DC

## 2013-01-02 MED ORDER — LISINOPRIL 20 MG PO TABS: 20.0000 mg | ORAL_TABLET | Freq: Every day | ORAL | Status: DC

## 2013-01-02 MED ORDER — METHOCARBAMOL 500 MG PO TABS: 500.0000 mg | ORAL_TABLET | Freq: Four times a day (QID) | ORAL | Status: DC | PRN

## 2013-01-02 MED ORDER — DEXAMETHASONE 4 MG PO TABS: ORAL_TABLET | ORAL | Status: DC

## 2013-01-02 NOTE — Progress Notes (Signed)
Pt was having urinary retention since we removed her foley yesterday evening. Did straight cath couple of times but still she is not able to void. Bladder scan > 800 ml: informed Dr.Lama, obtained order to reinsert Foley.    Maintained all aseptic technique and inserted new Foley, pt tolerated well. Will monitor. [place]

## 2013-01-02 NOTE — Discharge Summary (Signed)
Physician Discharge Summary  Kyleena A Capes OZH:[id number] DOB: 10-17-1930 DOA: 12/25/2012  PCP: [name], MD  Admit date: 12/25/2012 Discharge date: 01/02/2013  Time spent: 50 minutes  Recommendations for Outpatient Follow-up:  1. Follow up Dr Danielle Dess as outpatient in 2 weeks 2. Patient will be seen by Dr. Vernie Ammons, urology on 4/30 at 9 AM. She will continue the Foley catheter until then. 3. Patient be discharged on a Decadron taper.  Discharge Diagnoses:  Principal Problem:   Cervical spine fracture Active Problems:   Hypertension   Arthritis   Osteoporosis   Spinal stenosis of lumbar region   Chronic back pain   Hip pain- LEFT   AKI (acute kidney injury)   Fall   Anemia   Hyponatremia   Discharge Condition: stable  Diet recommendation: low salt diet  Filed Weights   12/26/12 0147 12/28/12 2030  Weight: 80.2 kg (176 lb 12.9 oz) 82.5 kg (181 lb 14.1 oz)    History of present illness:  79 y.o. female with history of HTN, presumed diastolic chf, severe spinal stenosis, osteoporosis, who has suffered a fall on Thursday morning at 2 AM - she fell face down and landed on her abdomen, crawled to the life alert system and pushed the button. EMS arrived but she refused to come to the hospital. She started developing left hip pain, and family also noted some left hand weakness and left leg weakness. She finally agreed to come to the hospital tonight and was found to have A C2 and C 5 spine fracture. She was seen by Dr. Danielle Dess from neurosurgery and he did no recommend surgical management. Patient will be placed on C collar and then we will admit.   Hospital Course:  Cervical spine fracture after fall from bed  Patient was seen by neurosurgery and underwent decompressive laminectomy c3,C4-5 and C6segmental fixation with facet screws C3-C4 C5-C6 posterior lateral arthrodesis with autograft and allograft C3-C6. At this time she is stable for discharge as per neurosurgery.  Will follow up Dr Danielle Dess as outpatient in 2 weeks Will taper down the dose of decadron and stop in 9 days.  Rhabdomyolysis  due to fall - patient was hydrated - CK has now essentially normalized   Urinary retention Patient has urinary retention, unable to void, will continue with foley catheter Spoke with urology who will see patient on 4/30 at 9 AM  Nasal swelling  No fracture on x-ray - improving clinically   Hypertension with history of diastolic CHF  BP control much improved - will d/c HCTZ and spironolactone, will restart her on Lisinopril   Diastolic CHF Will cut down the dose of lasix to 20 mg po daily  Osteoporosis with history of Spinal stenosis of lumbar region / chronic back pain  Patient has been living at home, ambulates with a walker but fell out of bed resulting in cervical spine fracture - PT/OT at SNF  Hip pain- LEFT  CT of the hip done - no fractures   AKI (acute kidney injury)  improved with IV fluid hydration - GFR approaching normal   Hyponatremia  resolved    Procedures: 4/15 - Decompressive laminectomy C3 C4-C5 and C6, segmental fixation with facet screws C3-C4 C5-C6 posterior lateral arthrodesis with autograft and allograft C3-C6      Consultations:  neurosurgery-[facility]  Discharge Exam: Filed Vitals:   01/01/13 2146 01/02/13 0045 01/02/13 0637 01/02/13 0937  BP: 146/67 143/69 174/64 166/61  Pulse: 83 62 66 79  Temp: 98.3 F (36.8  C) 98.1 F (36.7 C) 98.9 F (37.2 C) 98.2 F (36.8 C)  TempSrc: Oral Oral Oral Oral  Resp: 20  18 16   Height:      Weight:      SpO2: 97% 98% 97% 99%   Physical exam In general: Alert and oriented, in no acute distress Cardiovascular: Regular rate and rhythm, S1-S2 Lungs: Clear to auscultation bilaterally Abdomen: Soft, nontender, nondistended, positive bowel sounds Extremities: No clubbing or cyanosis or edema  Discharge Instructions  Discharge Orders   Future Orders Complete By Expires     Diet -  low sodium heart healthy  As directed     Increase activity slowly  As directed         Medication List    STOP taking these medications       quinapril-hydrochlorothiazide 20-12.5 MG per tablet  Commonly known as:  ACCURETIC     spironolactone 25 MG tablet  Commonly known as:  ALDACTONE      TAKE these medications       ALPRAZolam 0.25 MG tablet  Commonly known as:  XANAX  Take 0.25 mg by mouth every 6 (six) hours as needed for sleep.     aspirin EC 81 MG tablet  Take 81 mg by mouth daily.     dexamethasone 4 MG tablet  Commonly known as:  DECADRON  Decadron 4 mg po TIDx 3 days  Decadron 4 mg po BID x 3 days then   Decadron 4 mg po daily for 3  days then stop     furosemide 20 MG tablet  Commonly known as:  LASIX  Take 1 tablet (20 mg total) by mouth daily.     HYDROcodone-acetaminophen 10-325 MG per tablet  Commonly known as:  NORCO  Take 1 tablet by mouth every 6 (six) hours as needed for pain.     lisinopril 20 MG tablet  Commonly known as:  PRINIVIL,ZESTRIL  Take 1 tablet (20 mg total) by mouth daily.     methocarbamol 500 MG tablet  Commonly known as:  ROBAXIN  Take 1 tablet (500 mg total) by mouth every 6 (six) hours as needed.     metoprolol 50 MG tablet  Commonly known as:  LOPRESSOR  Take 50 mg by mouth 2 (two) times daily.     multivitamin with minerals Tabs  Take 1 tablet by mouth daily.     pramipexole 0.125 MG tablet  Commonly known as:  MIRAPEX  Take 0.25 mg by mouth at bedtime.     pravastatin 80 MG tablet  Commonly known as:  PRAVACHOL  Take 80 mg by mouth at bedtime.     senna-docusate 8.6-50 MG per tablet  Commonly known as:  Senokot-S  Take 1 tablet by mouth 2 (two) times daily.          The results of significant diagnostics from this hospitalization (including imaging, microbiology, ancillary and laboratory) are listed below for reference.    Significant Diagnostic Studies: Dg Nasal Bones  12/26/2012    IMPRESSION: No  acute finding.   Original Report Authenticated By: Holley Dexter, M.D.    Dg Cervical Spine 1 View  12/28/2012   IMPRESSION: Posterior fusion hardware placement from the C3 to the C6 level.   Original Report Authenticated By: Erskine Speed, M.D.    Dg Hip Complete Left  12/25/2012 IMPRESSION: No definite fracture or dislocation involving pelvis or left hip. There is mild irregularity at the junction of the left femoral  head and neck which could be related to osteophytes.  If there is high clinical concern for an acute fracture, recommend further evaluation with CT or MRI.   Original Report Authenticated By: Richarda Overlie, M.D.    Ct Head Wo Contrast  12/25/2012    IMPRESSION: No evidence of acute intracranial abnormality.  Atrophy with small vessel ischemic changes and intracranial atherosclerosis.    Ct Cervical Spine Wo Contrast  12/25/2012   IMPRESSION: Type 2 dens fracture.  Displaced anterior inferior corner fracture at C5.  Critical Value/emergent results were called by telephone at the time of interpretation on 12/25/2012 at 1425 hours to Dr. Donnetta Hutching, who verbally acknowledged these results.   Original Report Authenticated By: Charline Bills, M.D.    Mr Cervical Spine Wo Contrast  12/25/2012    IMPRESSION: Type 2 odontoid fracture and anterior inferior endplate C5 fracture as described above, without associated intraspinal hemorrhage or hematomyelia.  Severe multifactorial spondylosis C4-5 with critical cord compression. Canal diameter 3-4 mm.  Slight cord hyperintensity just caudad to the severe stenosis not clearly post traumatic, and may represent chronic myelomalacia versus acute central cord syndrome.  Findings discussed with Dr. Danielle Dess.   Original Report Authenticated By: Davonna Belling, M.D.    Ct Hip Left Wo Contrast  12/26/2012   IMPRESSION:  1.  Negative for fracture. 2.  Advanced osteoarthritis left hip.   Original Report Authenticated By: Holley Dexter, M.D.    Dg Shoulder  Left  12/25/2012   IMPRESSION: No acute bony abnormality in the left shoulder.   Original Report Authenticated By: Richarda Overlie, M.D.    Dg Hand Complete Left  12/25/2012    IMPRESSION: No acute osseous abnormality.  Severe osseous demineralization. Severe osteoarthritis involving the wrist.  Moderate osteoarthritis involving the IP joints of the hand.   Original Report Authenticated By: Hulan Saas, M.D.    Dg Foot 2 Views Right  01/01/2013    IMPRESSION: No displaced fracture.  Normal alignment of the second toe is apparently due to hammertoe deformity.  Diffuse osteopenia.   Original Report Authenticated By: Andreas Newport, M.D.    Dg C-arm 61-120 Min  12/28/2012 IMPRESSION: Posterior fusion hardware placement from the C3 to the C6 level.   Original Report Authenticated By: Erskine Speed, M.D.    Ct Maxillofacial Wo Cm  12/25/2012   IMPRESSION: No evidence of maxillofacial fracture.   Labs: Basic Metabolic Panel:  Recent Labs Lab 12/28/12 0505 12/29/12 0512 12/30/12 0259 01/02/13 0827  NA 138 141 137 135  K 4.8 5.3* 5.2* 4.5  CL 105 109 106 103  CO2 26 25 26 25   GLUCOSE 134* 133* 132* 114*  BUN 43* 50* 46* 36*  CREATININE 1.05 1.26* 1.06 0.83  CALCIUM 9.9 9.5 9.4 9.1   CBC:  Recent Labs Lab 12/28/12 0505 12/29/12 0512  WBC 13.1* 15.3*  HGB 11.1* 10.2*  HCT 32.1* 30.1*  MCV 90.4 92.0  PLT 247 239   Cardiac Enzymes:  Recent Labs Lab 12/28/12 0505 12/30/12 0259 12/31/12 0410  CKTOTAL 2989* 807* 310*     Recent Labs Lab 12/28/12 1125  GLUCAP 116*       Signed:  LAMA,GAGAN S  [facility] 01/02/2013, 6:30 PM

## 2013-01-02 NOTE — Progress Notes (Signed)
Subjective: Patient reports doing well  Objective: Vital signs in last 24 hours: Temp:  [97.2 F (36.2 C)-98.9 F (37.2 C)] 98.9 F (37.2 C) (04/20 0637) Pulse Rate:  [62-83] 66 (04/20 0637) Resp:  [18-20] 18 (04/20 0637) BP: (134-174)/(52-69) 174/64 mmHg (04/20 0637) SpO2:  [97 %-100 %] 97 % (04/20 0637)  Intake/Output from previous day: 04/19 0701 - 04/20 0700 In: 0  Out: 2750 [Urine:2750] Intake/Output this shift:    Physical Exam: Stable with persistent left greater than right hand weakness.  Lab Results: No results found for this basename: WBC, HGB, HCT, PLT,  in the last 72 hours BMET No results found for this basename: NA, K, CL, CO2, GLUCOSE, BUN, CREATININE, CALCIUM,  in the last 72 hours  Studies/Results: Dg Foot 2 Views Right  01/01/2013  *RADIOLOGY REPORT*  Clinical Data: Foot pain.  Second toe shows abnormal alignment. Foot ulcers.  RIGHT FOOT - 2 VIEW  Comparison: None.  Findings: Diffuse osteopenia is present.  There is abnormal alignment of the second toe radiographically.  On the lateral view, the second MTP joint appears located however there is hammertoe deformity on the lateral view.  Healed fracture of the fifth metatarsal noted.  [place]erg infraction of the second metatarsal head.  There is no displaced fracture identified however given the technical degradation of the study, fracture may not be apparent. Consider conservative treatment if there is clinical suspicion of fracture.  Hallux valgus is present with severe great toe sesamoid osteoarthritis.  Ulceration is present in the distal aspect of bunion.  IMPRESSION: No displaced fracture.  Normal alignment of the second toe is apparently due to hammertoe deformity.  Diffuse osteopenia.   Original Report Authenticated By: Andreas Newport, M.D.     Assessment/Plan: To [facility] in AM. Stable.  Dr. Danielle Dess to follow.    LOS: 8 days    Dorian Heckle, MD 01/02/2013, 7:52 AM

## 2013-01-02 NOTE — Progress Notes (Signed)
[facility] Progress Note [facility] TEAM 1 - Stepdown/ICU TEAM   [name] NWG:[id number] DOB: [birth year] DOA: 12/25/2012 PCP: [name], MD  Brief narrative: 79 y.o. female with history of HTN, presumed diastolic chf, severe spinal stenosis, osteoporosis, who suffered a fall at 2 AM - she fell face down and landed on her abdomen, crawled to the life alert system and pushed the button. EMS arrived but she refused to come to the hospital. She later started developing left hip pain, and family also noted some left hand weakness and left leg weakness. She finally agreed to come to the hospital and was found to have A C2 and C5 spine fracture. She was seen by Dr. Danielle Dess from neurosurgery and he did no recommend surgical management. Patient was placed on C collar.   Assessment/Plan:  Cervical spine fracture after fall from bed  Care as per neurosurgery - status post surgical stabilization 12/28/2012 Patient is on decadron 4 mg po q 6 hr, called and discussed with Dr Venetia Maxon, will taper off the Decadron.  Rhabdomyolysis due to fall - patient was hydrated - CK has now essentially normalized  Nasal swelling No fracture on x-ray - improving clinically  Hypertension with history of diastolic CHF BP control much improved   Osteoporosis with history of Spinal stenosis of lumbar region / chronic back pain  Patient has been living at home, ambulates with a walker but fell out of bed resulting in cervical spine fracture - PT/OT at discretion of neurosurgery   Hip pain- LEFT  CT of the hip done - no fractures   AKI (acute kidney injury) improved with IV fluid hydration - GFR approaching normal   Hyponatremia resolved   Foot pain Will obtain xray of the foot  Code Status: FULL Family Communication:  No family present at time of visit Disposition Plan: SNF - likely on Monday   Consultants: Neruosurgery  Procedures: 4/15 - Decompressive laminectomy C3 C4-C5 and C6,  segmental fixation with facet screws C3-C4 C5-C6 posterior lateral arthrodesis with autograft and allograft C3-C6  Antibiotics: None  DVT prophylaxis: SCDs  HPI/Subjective: The patient is alert and oriented. She complains of pain in the right toe. Xray of the foot shows no fracture.  Objective: Blood pressure 166/61, pulse 79, temperature 98.2 F (36.8 C), temperature source Oral, resp. rate 16, height 5\' 2"  (1.575 m), weight 82.5 kg (181 lb 14.1 oz), SpO2 99.00%.  Intake/Output Summary (Last 24 hours) at 01/02/13 1633 Last data filed at 01/02/13 0700  Gross per 24 hour  Intake      0 ml  Output   2750 ml  Net  -2750 ml     Exam: General: appear in no acute distress Lungs: Clear to auscultation bilaterally without wheezes or crackles Cardiovascular: Regular rate and rhythm without murmur gallop or rub  Abdomen: Nontender, nondistended, soft, bowel sounds positive, Extremities: No edema in the lower extremities Foot: Right big toe is tender to palpation and has dried blood on the lateral side  Data Reviewed: Basic Metabolic Panel:  Recent Labs Lab 12/28/12 0505 12/29/12 0512 12/30/12 0259 01/02/13 0827  NA 138 141 137 135  K 4.8 5.3* 5.2* 4.5  CL 105 109 106 103  CO2 26 25 26 25   GLUCOSE 134* 133* 132* 114*  BUN 43* 50* 46* 36*  CREATININE 1.05 1.26* 1.06 0.83  CALCIUM 9.9 9.5 9.4 9.1   CBC:  Recent Labs Lab 12/28/12 0505 12/29/12 0512  WBC 13.1* 15.3*  HGB 11.1*  10.2*  HCT 32.1* 30.1*  MCV 90.4 92.0  PLT 247 239   Cardiac Enzymes:  Recent Labs Lab 12/28/12 0505 12/30/12 0259 12/31/12 0410  CKTOTAL 2989* 807* 310*   CBG:  Recent Labs Lab 12/28/12 1125  GLUCAP 116*    Recent Results (from the past 240 hour(s))  MRSA PCR SCREENING     Status: None   Collection Time    12/28/12  8:01 AM      Result Value Range Status   MRSA by PCR NEGATIVE  NEGATIVE Final   Comment:            The GeneXpert MRSA Assay (FDA     approved for NASAL  specimens     only), is one component of a     comprehensive MRSA colonization     surveillance program. It is not     intended to diagnose MRSA     infection nor to guide or     monitor treatment for     MRSA infections.       Scheduled Meds:  Scheduled Meds: . calcium carbonate  1 tablet Oral TID WC  . dexamethasone  4 mg Oral Q6H  . lisinopril  20 mg Oral Daily  . metoprolol  50 mg Oral BID  . multivitamin with minerals  1 tablet Oral Daily  . pantoprazole  40 mg Oral Daily  . polyethylene glycol  17 g Oral Daily  . pramipexole  0.25 mg Oral QHS  . senna-docusate  1 tablet Oral BID  . simvastatin  10 mg Oral q1800   Continuous Infusions: . sodium chloride 25 mL/hr at 12/31/12 1339    Time spent on care of this patient:   [name] S [id number] If 7PM-7AM, please contact night-coverage [email or web address] Password [facility] 01/02/2013, 4:33 PM   LOS: 8 days

## 2013-01-03 DIAGNOSIS — M549 Dorsalgia, unspecified: Secondary | ICD-10-CM

## 2013-01-03 DIAGNOSIS — M25559 Pain in unspecified hip: Secondary | ICD-10-CM

## 2013-01-03 DIAGNOSIS — W19XXXA Unspecified fall, initial encounter: Secondary | ICD-10-CM

## 2013-01-03 NOTE — Progress Notes (Signed)
Physical Therapy Treatment Patient Details Name: [name] MRN: [id number] DOB: [birth year] Today's Date: 01/03/2013 Time: 0865-7846 PT Time Calculation (min): 29 min  PT Assessment / Plan / Recommendation Comments on Treatment Session  Pt very motivated but is progressing slowly at this time.  Pt very fearful of falling.  Requires +2 (A) for ambulation.      Follow Up Recommendations  SNF     Does the patient have the potential to tolerate intense rehabilitation     Barriers to Discharge        Equipment Recommendations  None recommended by PT    Recommendations for Other Services    Frequency Min 3X/week   Plan      Precautions / Restrictions Precautions Precautions: Cervical;Fall Required Braces or Orthoses: Cervical Brace Cervical Brace: Soft collar Restrictions Weight Bearing Restrictions: No       Mobility  Bed Mobility Bed Mobility: Rolling Left;Left Sidelying to Sit;Sitting - Scoot to Edge of Bed Rolling Left: 3: Mod assist;With rail Left Sidelying to Sit: 3: Mod assist;HOB flat;With rails Sitting - Scoot to Edge of Bed: 3: Mod assist Details for Bed Mobility Assistance: Cues for sequencing & technique.  (A) to rotate trunk & hips to Lt sidelying, (A) to move LE's over EOB & minor (A) to bring shoulders/trunk to sitting uprgiht.   Transfers Transfers: Sit to Stand;Stand to Sit Sit to Stand: 2: Max assist;With upper extremity assist;With armrests;From bed;From chair/3-in-1 Stand to Sit: 2: Max assist;With upper extremity assist;With armrests;To chair/3-in-1 Details for Transfer Assistance: cues for hand placement & initiation.  (A) to properly position LE's, (A) to lift hips, balance, anterior translation of COB over BOS, & facilitation to increase upright posture.   Pt with flexed posture at trunk & hips, but her BOS stay in front of her body causing her LOB posteriorly.   pt fearful of falling.   Ambulation/Gait Ambulation/Gait Assistance: 1: +2 Total  assist Ambulation/Gait: Patient Percentage: 40% Ambulation Distance (Feet):  (3 steps forwards) Assistive device: Rolling walker Ambulation/Gait Assistance Details: (A) for balance, facilitation for trunk & hip extension.  Pt leans posteriorly but remains flexed at trunk & hips & tends to keep her feet positioned out in front of body.   Gait Pattern: Narrow base of support;Trunk flexed;Right flexed knee in stance;Left flexed knee in stance General Gait Details: Very poor balance & ability to stand upright at this date.   Stairs: No Wheelchair Mobility Wheelchair Mobility: No      PT Goals Acute Rehab PT Goals Time For Goal Achievement: 01/09/13 Potential to Achieve Goals: Good Pt will go Supine/Side to Sit: with min assist PT Goal: Supine/Side to Sit - Progress: Progressing toward goal Pt will go Sit to Supine/Side: with min assist Pt will go Sit to Stand: with min assist PT Goal: Sit to Stand - Progress: Progressing toward goal Pt will go Stand to Sit: with min assist PT Goal: Stand to Sit - Progress: Progressing toward goal Pt will Transfer Bed to Chair/Chair to Bed: with min assist Pt will Ambulate: 1 - 15 feet;with rolling walker;with mod assist PT Goal: Ambulate - Progress: Not met  Visit Information  Last PT Received On: 01/03/13 Assistance Needed: +2    Subjective Data      Cognition  Cognition Arousal/Alertness: Awake/alert Behavior During Therapy: WFL for tasks assessed/performed Overall Cognitive Status: Within Functional Limits for tasks assessed    Balance     End of Session PT - End of Session Equipment Utilized During  Treatment: Gait belt;Cervical collar Activity Tolerance: Patient tolerated treatment well Patient left: in chair;with call bell/phone within reach;with family/visitor present     Verdell Face, PTA [phone] 01/03/2013

## 2013-01-03 NOTE — Clinical Social Work Note (Signed)
Clinical Social Work   Pt is ready for discharge to [facility]. Facility has received discharge summary and is ready to accept pt. Pt and daughter are agreeable to discharge plan. [facility] will provide transportation. CSW is signing off as no further needs identified.   Dede Query, MSW, LCSW [phone]

## 2013-01-03 NOTE — Progress Notes (Signed)
Occupational Therapy Treatment Patient Details Name: [name] MRN: [id number] DOB: 1930/12/05 Today's Date: 01/03/2013 Time: 4098-1191 OT Time Calculation (min): 29 min  OT Assessment / Plan / Recommendation Comments on Treatment Session Pt making progress and is able to participate in L hand exercises with min A for recall, 4th digit still has MP subluxation and pt has difficulty with grip/grasping RW handles and ADL/toiletry items. Pt continues to use 2 handled cup for independent drinking and required assist to cut up food    Follow Up Recommendations  SNF    Barriers to Discharge   Pt lives at home alone    Equipment Recommendations  Other (comment) (TBD at SNF level of care)    Recommendations for Other Services    Frequency Min 3X/week   Plan Discharge plan remains appropriate    Precautions / Restrictions Precautions Precautions: Cervical;Fall Required Braces or Orthoses: Cervical Brace Cervical Brace: Soft collar Restrictions Weight Bearing Restrictions: No   Pertinent Vitals/Pain     ADL  Grooming: Performed;Wash/dry hands;Wash/dry face;Supervision/safety;Set up Where Assessed - Grooming: Supported sitting Toilet Transfer: Simulated;Maximal Dentist Method: Sit to stand Toileting - Clothing Manipulation and Hygiene: +1 Total assistance;Performed Where Assessed - Engineer, mining and Hygiene: Standing Equipment Used: Gait belt;Rolling walker    OT Diagnosis:    OT Problem List:   OT Treatment Interventions:     OT Goals ADL Goals ADL Goal: Grooming - Progress: Progressing toward goals ADL Goal: Statistician - Progress: Progressing toward goals ADL Goal: Toileting - Hygiene - Progress: Not progressing ADL Goal: Additional Goal #1 - Progress: Progressing toward goals Arm Goals Arm Goal: Theraputty Exercises - Progress: Progressing toward goal Arm Goal: Additional Goal #1 - Progress: Progressing toward goals  Visit  Information  Last OT Received On: 01/03/13 Assistance Needed: +2 PT/OT Co-Evaluation/Treatment: Yes    Subjective Data  Subjective: " I didn't get up at all yesterday " Patient Stated Goal: To return home after therapy at SNF   Prior Functioning       Cognition  Cognition Arousal/Alertness: Awake/alert Behavior During Therapy: WFL for tasks assessed/performed Overall Cognitive Status: Within Functional Limits for tasks assessed    Mobility  Bed Mobility Bed Mobility: Rolling Left;Left Sidelying to Sit;Sitting - Scoot to Edge of Bed Rolling Left: 3: Mod assist;With rail Left Sidelying to Sit: 3: Mod assist;HOB flat;With rails Sitting - Scoot to Edge of Bed: 3: Mod assist Details for Bed Mobility Assistance: Cues for sequencing & technique.  (A) to rotate trunk & hips to Lt sidelying, (A) to move LE's over EOB & minor (A) to bring shoulders/trunk to sitting uprgiht.   Transfers Transfers: Sit to Stand;Stand to Sit Sit to Stand: 2: Max assist;With upper extremity assist;With armrests;From bed;From chair/3-in-1 Stand to Sit: 2: Max assist;With upper extremity assist;With armrests;To chair/3-in-1 Details for Transfer Assistance: cues for hand placement & initiation.  (A) to properly position LE's, (A) to lift hips, balance, anterior translation of COB over BOS, & facilitation to increase upright posture.   Pt with flexed posture at trunk & hips, but her BOS stay in front of her body causing her LOB posteriorly.   pt fearful of falling.      Exercises  Other Exercises Other Exercises: Pt's dtr returned theraputty to pt's room. Pt worked on theraputty exercises with L hand.  Other Exercises: finger lifts x 10 L hand from side table Other Exercises: opposition L hand x 10 reps   Balance Balance Balance Assessed: No  End of Session OT - End of Session Equipment Utilized During Treatment: Gait belt;Other (comment);Cervical collar (RW) Activity Tolerance: Patient tolerated treatment  well Patient left: in chair;with call bell/phone within reach;with family/visitor present  GO     Galen Manila 01/03/2013, 3:43 PM

## 2013-01-04 ENCOUNTER — Non-Acute Institutional Stay (SKILLED_NURSING_FACILITY): Payer: Medicare Other | Admitting: Internal Medicine

## 2013-01-04 DIAGNOSIS — I509 Heart failure, unspecified: Secondary | ICD-10-CM

## 2013-01-04 DIAGNOSIS — IMO0002 Reserved for concepts with insufficient information to code with codable children: Secondary | ICD-10-CM

## 2013-01-04 DIAGNOSIS — S129XXS Fracture of neck, unspecified, sequela: Secondary | ICD-10-CM

## 2013-01-04 DIAGNOSIS — I10 Essential (primary) hypertension: Secondary | ICD-10-CM

## 2013-01-04 DIAGNOSIS — D62 Acute posthemorrhagic anemia: Secondary | ICD-10-CM

## 2013-01-13 ENCOUNTER — Other Ambulatory Visit: Payer: Self-pay | Admitting: *Deleted

## 2013-01-13 MED ORDER — HYDROCODONE-ACETAMINOPHEN 10-325 MG PO TABS: ORAL_TABLET | ORAL | Status: DC

## 2013-01-19 ENCOUNTER — Encounter: Payer: Self-pay | Admitting: Adult Health

## 2013-01-19 NOTE — Progress Notes (Signed)
Patient ID: [name], female   DOB: [birth year], 79 y.o.   MRN: [id number]        HISTORY & PHYSICAL  DATE:  01/04/2013  FACILITY: [facility]   LEVEL OF CARE: SNF   ALLERGIES:  Allergies  Allergen Reactions  . Penicillins     CHIEF COMPLAINT:  Manage C-spine fracture, hypertension and CHF.    HISTORY OF PRESENT ILLNESS:  79 year-old, Caucasian female had a fall and started developing left hip pain, left hand weakness and left leg weakness.  She was diagnosed with a C2 and C5 cervical spine fracture.  She was placed on a cervical collar and then underwent decompressive laminectomy, C3, C4-5 and C6 segmental fixation with screw, C3, C4, C5-C6 posterolateral arthrodesis, with autograft and allograft C3-C6.  The patient is admitted to this facility for short-term rehabilitation.  She denies any pain currently.    HTN: Pt 's HTN remains stable.  Denies CP, sob, DOE, pedal edema, headaches, dizziness or visual disturbances.  No complications from the medications currently being used.  Last BP : 118/63.  CHF:The patient does not relate significant weight changes, denies sob, DOE, orthopnea, PNDs, pedal edema, palpitations or chest pain.  CHF remains stable.  No complications form the medications being used.   PAST MEDICAL HISTORY :  Past Medical History  Diagnosis Date  . CHF (congestive heart failure)   . Coronary artery disease   . Hypertension   . Arthritis   . Edema of extremities     PAST SURGICAL HISTORY: Past Surgical History  Procedure Laterality Date  . Joint replacement Bilateral     knee replacements  . Back surgery    . Cholecystectomy    . Posterior cervical fusion/foraminotomy N/A 12/28/2012    Procedure: POSTERIOR CERVICAL FUSION/FORAMINOTOMY LEVEL 3;  Surgeon: Barnett Abu, MD;  Location: [facility];  Service: Neurosurgery;  Laterality: N/A;  Posterior Cervical Three-Six Laminectomy and Fusion    SOCIAL HISTORY:  reports that she has never smoked. She  does not have any smokeless tobacco history on file. She reports that she does not drink alcohol or use illicit drugs.  FAMILY HISTORY:  Family History  Problem Relation Age of Onset  . Dementia Sister   . CAD Brother     CURRENT MEDICATIONS: Reviewed per [facility]  REVIEW OF SYSTEMS:  See HPI otherwise 14 point ROS is negative.  PHYSICAL EXAMINATION  VS:  T 97       P 51      RR 18      BP 118/63      POX 96% room air        WT (Lb)  GENERAL: no acute distress, moderate body habitus SKIN: periorbital ecchymoses around right eye, warm & dry, no suspicious lesions or rashes, no excessive dryness EYES: conjunctivae normal, sclerae normal, normal eye lids MOUTH/THROAT: lips without lesions,no lesions in the mouth,tongue is without lesions,uvula elevates in midline NECK: cervical collar in place; therefore, unable to assess neck LYMPHATICS: no LAN in the neck, no supraclavicular LAN RESPIRATORY: breathing is even & unlabored, BS CTAB CARDIAC: RRR, no murmur,no extra heart sounds, no edema GI:  ABDOMEN: abdomen soft, normal BS, no masses, no tenderness  LIVER/SPLEEN: no hepatomegaly, no splenomegaly MUSCULOSKELETAL: HEAD: normal to inspection & palpation BACK: no kyphosis, scoliosis or spinal processes tenderness EXTREMITIES: LEFT UPPER EXTREMITY: full range of motion, normal strength & tone RIGHT UPPER EXTREMITY:  full range of motion, normal strength & tone LEFT LOWER  EXTREMITY: strength decreased, range of motion minimal  RIGHT LOWER EXTREMITY: strength decreased, range of motion minimal  PSYCHIATRIC: the patient is alert & oriented to person, affect & behavior appropriate  LABS/RADIOLOGY: Glucose 114, otherwise BMP normal.    White count 15.3, hemoglobin 10.2, MCV 92, platelets 239.    Total CK 310.     X-ray of the knees showed no acute findings.    C-spine x-ray postsurgically showed posterior fusion hardware placement from the C3 to C6 level.    Left hip x-ray did not  show acute fractures or dislocation in the pelvis or hip.    CT of the head:  No acute findings.     CT of the cervical spine showed C2 fracture, displaced anterior/inferior fracture at C5.    MRI of the cervical spine odontoid fracture at anterior/inferior C5 fracture, severe multifactorial spondylosis C4-5 with critical cord compression.    CT of the left hip:  No acute fracture, advanced osteoarthritis.    Left shoulder x-ray:  No acute findings.     Left hand x-ray:  No acute findings, severe osteoarthritis of the wrist.    Right foot x-ray:  No acute findings.   CT of the maxillofacial showed no evidence of fracture.    ASSESSMENT/PLAN:  C-spine fracture.  Status post surgery.  Continue cervical collar and rehabilitation.   Hypertension.  Well controlled.   CHF.  Well compensated.   Acute blood loss anemia.  Reassess hemoglobin level.   Urinary retention.  Continue Foley and follow up with urologist.  Leukocytosis.  Likely secondary to steroids.  We will reassess.    Check CBC with diff and BMP.   I have reviewed patient's medical records received at admission/from hospitalization.  CPT CODE: 13244

## 2013-01-21 ENCOUNTER — Other Ambulatory Visit: Payer: Self-pay | Admitting: *Deleted

## 2013-01-21 MED ORDER — ALPRAZOLAM 0.25 MG PO TABS: ORAL_TABLET | ORAL | Status: DC

## 2013-03-03 ENCOUNTER — Encounter: Payer: Self-pay | Admitting: Internal Medicine

## 2013-03-04 ENCOUNTER — Non-Acute Institutional Stay (SKILLED_NURSING_FACILITY): Payer: Medicare Other | Admitting: Internal Medicine

## 2013-03-04 DIAGNOSIS — I509 Heart failure, unspecified: Secondary | ICD-10-CM

## 2013-03-04 DIAGNOSIS — D638 Anemia in other chronic diseases classified elsewhere: Secondary | ICD-10-CM

## 2013-03-04 DIAGNOSIS — I10 Essential (primary) hypertension: Secondary | ICD-10-CM

## 2013-03-04 DIAGNOSIS — I2510 Atherosclerotic heart disease of native coronary artery without angina pectoris: Secondary | ICD-10-CM

## 2013-03-10 ENCOUNTER — Encounter: Payer: Self-pay | Admitting: Adult Health

## 2013-03-11 ENCOUNTER — Encounter: Payer: Self-pay | Admitting: *Deleted

## 2013-03-11 DIAGNOSIS — D638 Anemia in other chronic diseases classified elsewhere: Secondary | ICD-10-CM | POA: Insufficient documentation

## 2013-03-11 NOTE — Progress Notes (Signed)
PROGRESS NOTE  DATE: 03/04/2013  FACILITY: Nursing Home Location: [facility]  LEVEL OF CARE: SNF (31)  Routine Visit  CHIEF COMPLAINT:  Manage CHF, hypertension and CAD  HISTORY OF PRESENT ILLNESS:  REASSESSMENT OF ONGOING PROBLEM(S):  CHF:The patient does not relate significant weight changes, denies sob, DOE, orthopnea, PNDs, pedal edema, palpitations or chest pain.  CHF remains stable.  No complications form the medications being used.  HTN: Pt 's HTN remains stable.  Denies CP, sob, DOE, pedal edema, headaches, dizziness or visual disturbances.  No complications from the medications currently being used.  Last BP : 117/58.  CAD: The angina has been stable. The patient denies dyspnea on exertion, orthopnea, pedal edema, palpitations and paroxysmal nocturnal dyspnea. No complications noted from the medication presently being used.  PAST MEDICAL HISTORY : Reviewed.  No changes.  CURRENT MEDICATIONS: Reviewed per [facility]  REVIEW OF SYSTEMS:  GENERAL: no change in appetite, no fatigue, no weight changes, no fever, chills or weakness RESPIRATORY: no cough, SOB, DOE, wheezing, hemoptysis CARDIAC: no chest pain, edema or palpitations GI: no abdominal pain, diarrhea, constipation, heart burn, nausea or vomiting  PHYSICAL EXAMINATION  VS:  T 98.2       P 72      RR 19      BP     POX % 97     WT (Lb)  GENERAL: no acute distress, moderately obese body habitus EYES: conjunctivae normal, sclerae normal, normal eye lids NECK: supple, trachea midline, no neck masses, no thyroid tenderness, no thyromegaly LYMPHATICS: no LAN in the neck, no supraclavicular LAN RESPIRATORY: breathing is even & unlabored, BS CTAB CARDIAC: RRR, no murmur,no extra heart sounds, no edema GI: abdomen soft, normal BS, no masses, no tenderness, no hepatomegaly, no splenomegaly PSYCHIATRIC: the patient is alert & oriented to person, affect & behavior appropriate  LABS/RADIOLOGY:  6/14  WBC 11, hemoglobin 7.5, MCV 92.7, platelets 467 5/14 triglycerides 163, HDL 39 otherwise fasting lipid panel normal  ASSESSMENT/PLAN:  CHF-well compensated. CAD- stable. Hypertension-well controlled. Anemia of chronic disease-continue iron. Recheck pending. Constipation-well-controlled. Hyperlipidemia-well-controlled. Anxiety-stable. Check liver profile.  CPT CODE: 16109

## 2013-03-14 ENCOUNTER — Ambulatory Visit (INDEPENDENT_AMBULATORY_CARE_PROVIDER_SITE_OTHER): Payer: Medicare Other | Admitting: Internal Medicine

## 2013-03-14 ENCOUNTER — Other Ambulatory Visit: Payer: Self-pay | Admitting: Internal Medicine

## 2013-03-14 ENCOUNTER — Encounter: Payer: Self-pay | Admitting: Internal Medicine

## 2013-03-14 VITALS — BP 80/50 | HR 100 | Ht 62.0 in | Wt 162.2 lb

## 2013-03-14 DIAGNOSIS — R195 Other fecal abnormalities: Secondary | ICD-10-CM

## 2013-03-14 DIAGNOSIS — D649 Anemia, unspecified: Secondary | ICD-10-CM

## 2013-03-14 MED ORDER — MOVIPREP 100 G PO SOLR: ORAL | Status: DC

## 2013-03-14 NOTE — Progress Notes (Addendum)
Subjective:    Patient ID: [name], female    DOB: August 14, 1931, 79 y.o.   MRN: [id number]  HPI This is a very nice elderly woman who was hospitalized in the spring after she fractured her cervical spine, and she underwent directed surgery. She is in a rehabilitation facility, and slowly recovering. She has been anemic for some time with a hemoglobin in the 7 range with a normal MCV. She was recently found to be Hemoccult-positive. She has not seen any bleeding. She does relate a history of some chronic constipation well managed by [facility], as she has taken hydrocodone for some chronic back pain prior to her injury. She also has had intermittent upper abdominal pain without obvious trigger that she attributed to stress. She probably has lost some weight with her illness, her appetite has been okay. She is weak and not really able after she is using a walker she is here in wheelchair today. She can stand a little bit. Her daughter is here and provide some of the history today as well.  Allergies  Allergen Reactions  . Penicillins    Outpatient Prescriptions Prior to Visit  Medication Sig Dispense Refill  . ALPRAZolam (XANAX) 0.25 MG tablet Take 1 tablet every night at bedtime for anxiety  30 tablet  5  . aspirin EC 81 MG tablet Take 81 mg by mouth daily.      . furosemide (LASIX) 20 MG tablet Take 1 tablet (20 mg total) by mouth daily.  30 tablet  0  . HYDROcodone-acetaminophen (NORCO) 10-325 MG per tablet Take 1 tablets every 6 hours as needed for mild pain(1-4) Take 2 tablets every 6 hours as needed for moderate to severe pain(5-10).  240 tablet  0  . lisinopril (PRINIVIL,ZESTRIL) 20 MG tablet Take 1 tablet (20 mg total) by mouth daily.  30 tablet  0  . methocarbamol (ROBAXIN) 500 MG tablet Take 1 tablet (500 mg total) by mouth every 6 (six) hours as needed.  30 tablet  0  . metoprolol (LOPRESSOR) 50 MG tablet Take 50 mg by mouth 2 (two) times daily.      . Multiple Vitamin (MULTIVITAMIN  WITH MINERALS) TABS Take 1 tablet by mouth daily.      . pramipexole (MIRAPEX) 0.125 MG tablet Take 0.25 mg by mouth at bedtime.      . senna-docusate (SENOKOT-S) 8.6-50 MG per tablet Take 1 tablet by mouth 2 (two) times daily.  30 tablet  0  . dexamethasone (DECADRON) 4 MG tablet Decadron 4 mg po TIDx 3 days Decadron 4 mg po BID x 3 days then  Decadron 4 mg po daily for 3  days then stop  20 tablet  0  . pravastatin (PRAVACHOL) 80 MG tablet Take 80 mg by mouth at bedtime.       No facility-administered medications prior to visit.   Past Medical History  Diagnosis Date  . CHF (congestive heart failure)   . Coronary artery disease   . Hypertension   . Arthritis   . Edema of extremities   . Anxiety   . HLD (hyperlipidemia)    Past Surgical History  Procedure Laterality Date  . Total knee arthroplasty Bilateral     knee replacements  . Lumbar disc surgery    . Cholecystectomy    . Posterior cervical fusion/foraminotomy N/A 12/28/2012    Procedure: POSTERIOR CERVICAL FUSION/FORAMINOTOMY LEVEL 3;  Surgeon: Barnett Abu, MD;  Location: [facility];  Service: Neurosurgery;  Laterality: N/A;  Posterior Cervical Three-Six Laminectomy and Fusion  . Angioplasty    . Tonsillectomy     History   Social History  . Marital Status: Married    Spouse Name: N/A    Number of Children: 5  . Years of Education: N/A   Occupational History  . retired    Social History Main Topics  . Smoking status: Never Smoker   . Smokeless tobacco: Never Used  . Alcohol Use: Yes     Comment: rarely  . Drug Use: No  . Sexually Active: None   Other Topics Concern  . None   Social History Narrative  . None   Family History  Problem Relation Age of Onset  . Dementia Sister     x 3  . CAD Brother   . Breast cancer Sister     x 3  . Heart disease Sister   . Heart disease Son     x 2  . Alzheimer's disease Sister    Review of Systems Positive for those things mentioned in the history of  present illness, she has decreased vision, hearing problems arthritis and back pain. He has some anxiety. All other review of systems negative.    Objective:   Physical Exam General:  Chronically ill in wheelchair Eyes:  Anicteric, pale conjuctiva ENT:   Mouth and posterior pharynx free of lesions. Pale mucosa Neck:   supple w/o thyromegaly or mass.  Lungs: Clear to auscultation bilaterally. Heart:  S1S2, no rubs, murmurs, gallops. Abdomen:  soft, non-tender, no hepatosplenomegaly, or mass and BS+. + small reducible hernia above umbilicus Rectal: deferred Lymph:  no cervical or supraclavicular adenopathy. Extremities:   no edema Neuro:  A&O x 3.  Psych:  appropriate mood and  Affect.   Data Reviewed: Multiple nursing home records. Hospital notes from cervical spine fracture hospitalization. Her most recent hemoglobin is 7.7 and has been from 7-8 over the past month. MCV is normal. I do not see B12 or ferritin studies. She did have an acute kidney injury at one point. There is a diagnosis of the anemia of chronic disease in her hospital records. Hemoglobin was 12.1 on May 1.    Assessment & Plan:   1. Heme positive stool   2. Anemia    1. Heme positive anemia in this lady indicates need for evaluation, she has never had a colonoscopy or an upper endoscopy so I will schedule her for both of those. The risks and benefits as well as alternatives of endoscopic procedure(s) have been discussed and reviewed. All questions answered. The patient agrees to proceed. Anemia of chronic disease with another cause of heme positive stool certainly possible. She had blood work checked today at the nursing home, I think she should have iron studies and a B12 level we will call to see what they have done and if that has not been done we will order that. Note that given her comorbidities and complicated medical history I think it best that she have her procedure performed at the hospital where there is a  higher safety net. Given her comorbidities she would do less well with any type of complication and with her C-spine fracture she could be at higher risk of airway difficulties. I appreciate the opportunity to care for this patient.  CC: [name], MD Murray Hodgkins, MD   Labs were done - 03/15/2013  B12 is 508 Ferritin 221 Iron sat 12% UIBC 205 and TIBC 233  This is consistent with  anemia of chronic disease.  She has decided to postpone endoscopic evaluation until release from [facility].  Iva Boop, MD, [facility] [phone] (pager) 03/24/2013 5:31 PM

## 2013-03-14 NOTE — Patient Instructions (Addendum)
You have been scheduled for an endoscopy and colonoscopy at [facility]. Please follow the written instructions given to you at your visit today. Please give the nursing center the rx for your prep kit to be ordered. If you use inhalers (even only as needed), please bring them with you on the day of your procedure.

## 2013-03-15 ENCOUNTER — Encounter: Payer: Self-pay | Admitting: Internal Medicine

## 2013-03-21 ENCOUNTER — Other Ambulatory Visit: Payer: Self-pay | Admitting: Geriatric Medicine

## 2013-03-21 ENCOUNTER — Telehealth: Payer: Self-pay | Admitting: Internal Medicine

## 2013-03-21 MED ORDER — HYDROCODONE-ACETAMINOPHEN 10-325 MG PO TABS: ORAL_TABLET | ORAL | Status: DC

## 2013-03-21 NOTE — Telephone Encounter (Signed)
Left message for patient to call back  

## 2013-03-22 NOTE — Telephone Encounter (Signed)
I have spoke with the daughter [name], she is not able to talk her mother into keeping the procedures scheduled while she is admitted to [facility].  They will call back to reschedule in September, when she is ready

## 2013-04-01 ENCOUNTER — Encounter: Payer: Self-pay | Admitting: Internal Medicine

## 2013-04-01 ENCOUNTER — Non-Acute Institutional Stay (SKILLED_NURSING_FACILITY): Payer: Medicare Other | Admitting: Internal Medicine

## 2013-04-01 DIAGNOSIS — I2510 Atherosclerotic heart disease of native coronary artery without angina pectoris: Secondary | ICD-10-CM

## 2013-04-01 DIAGNOSIS — I10 Essential (primary) hypertension: Secondary | ICD-10-CM

## 2013-04-01 DIAGNOSIS — D638 Anemia in other chronic diseases classified elsewhere: Secondary | ICD-10-CM

## 2013-04-01 DIAGNOSIS — I509 Heart failure, unspecified: Secondary | ICD-10-CM

## 2013-04-04 ENCOUNTER — Non-Acute Institutional Stay (SKILLED_NURSING_FACILITY): Payer: Medicare Other | Admitting: Internal Medicine

## 2013-04-04 DIAGNOSIS — I509 Heart failure, unspecified: Secondary | ICD-10-CM

## 2013-04-04 DIAGNOSIS — I2510 Atherosclerotic heart disease of native coronary artery without angina pectoris: Secondary | ICD-10-CM

## 2013-04-04 DIAGNOSIS — IMO0002 Reserved for concepts with insufficient information to code with codable children: Secondary | ICD-10-CM

## 2013-04-04 DIAGNOSIS — I10 Essential (primary) hypertension: Secondary | ICD-10-CM

## 2013-04-04 DIAGNOSIS — S129XXS Fracture of neck, unspecified, sequela: Secondary | ICD-10-CM

## 2013-04-06 ENCOUNTER — Encounter (HOSPITAL_COMMUNITY): Admission: RE | Payer: Self-pay | Source: Ambulatory Visit

## 2013-04-06 ENCOUNTER — Ambulatory Visit (HOSPITAL_COMMUNITY): Admission: RE | Admit: 2013-04-06 | Payer: Medicare Other | Source: Ambulatory Visit | Admitting: Internal Medicine

## 2013-04-06 SURGERY — EGD (ESOPHAGOGASTRODUODENOSCOPY)
Anesthesia: Moderate Sedation

## 2013-04-07 NOTE — Progress Notes (Signed)
PROGRESS NOTE  DATE: 04-04-13  FACILITY: Nursing Home Location: [facility]  LEVEL OF CARE: SNF (31)  Discharge Visit  CHIEF COMPLAINT:  Manage CHF, hypertension and CAD  HISTORY OF PRESENT ILLNESS: I was requested by the social worker to perform face-to-face evaluation for discharge. Patient was hospitalized for a C-spine fracture. She is status post decompression and laminectomy. Has completed in- facility rehabilitation.  REASSESSMENT OF ONGOING PROBLEM(S):  CHF:The patient does not relate significant weight changes, denies sob, DOE, orthopnea, PNDs, pedal edema, palpitations or chest pain.  CHF remains stable.  No complications form the medications being used.  HTN: Pt 's HTN remains stable.  Denies CP, sob, DOE, pedal edema, headaches, dizziness or visual disturbances.  No complications from the medications currently being used.  Last BP : 117/58, 96/82  CAD: The angina has been stable. The patient denies dyspnea on exertion, orthopnea, pedal edema, palpitations and paroxysmal nocturnal dyspnea. No complications noted from the medication presently being used.  PAST MEDICAL HISTORY : Reviewed.  No changes.  CURRENT MEDICATIONS: Reviewed per [facility]  REVIEW OF SYSTEMS:  GENERAL: no change in appetite, no fatigue, no weight changes, no fever, chills or weakness RESPIRATORY: no cough, SOB, DOE, wheezing, hemoptysis CARDIAC: no chest pain, edema or palpitations GI: no abdominal pain, diarrhea, constipation, heart burn, nausea or vomiting  PHYSICAL EXAMINATION  VS:  T 97.8       P 86      RR 18      BP 96/82     POX %     WT (Lb)  GENERAL: no acute distress, moderately obese body habitus NECK: supple, trachea midline, no neck masses, no thyroid tenderness, no thyromegaly RESPIRATORY: breathing is even & unlabored, BS CTAB CARDIAC: RRR, no murmur,no extra heart sounds, no edema GI: abdomen soft, normal BS, no masses, no tenderness, no hepatomegaly, no  splenomegaly PSYCHIATRIC: the patient is alert & oriented to person, affect & behavior appropriate  LABS/RADIOLOGY: 7/14 serum iron 28, TIBC 233, percent saturation 12, vitamin B12 508, ferritin 221  6/14 WBC 11, hemoglobin 7.5, MCV 92.7, platelets 467, repeat CBC showed hemoglobin 7.7, MCV 91, platelets 454 otherwise CBC normal, albumin 2.8 otherwise liver profile normal 5/14 triglycerides 163, HDL 39 otherwise fasting lipid panel normal  ASSESSMENT/PLAN:  C-spine fracture-status post decompression and laminectomy. CHF-well compensated. CAD- stable. Hypertension-well controlled. Anemia of chronic disease-continue iron. Constipation-well-controlled. Hyperlipidemia-well-controlled. Anxiety-stable. Restless leg syndrome-Mirapex was increased.  I have filled out patient's discharge paperwork and written prescriptions. No home health services ordered. No DME given.  CPT CODE: 40981

## 2013-04-07 NOTE — Progress Notes (Signed)
PROGRESS NOTE  DATE: 04-01-13  FACILITY: Nursing Home Location: [facility]  LEVEL OF CARE: SNF (31)  Routine Visit  CHIEF COMPLAINT:  Manage CHF, hypertension and CAD  HISTORY OF PRESENT ILLNESS:  REASSESSMENT OF ONGOING PROBLEM(S):  CHF:The patient does not relate significant weight changes, denies sob, DOE, orthopnea, PNDs, pedal edema, palpitations or chest pain.  CHF remains stable.  No complications form the medications being used.  HTN: Pt 's HTN remains stable.  Denies CP, sob, DOE, pedal edema, headaches, dizziness or visual disturbances.  No complications from the medications currently being used.  Last BP : 117/58, 96/82  CAD: The angina has been stable. The patient denies dyspnea on exertion, orthopnea, pedal edema, palpitations and paroxysmal nocturnal dyspnea. No complications noted from the medication presently being used.  PAST MEDICAL HISTORY : Reviewed.  No changes.  CURRENT MEDICATIONS: Reviewed per [facility]  REVIEW OF SYSTEMS:  GENERAL: no change in appetite, no fatigue, no weight changes, no fever, chills or weakness RESPIRATORY: no cough, SOB, DOE, wheezing, hemoptysis CARDIAC: no chest pain, edema or palpitations GI: no abdominal pain, diarrhea, constipation, heart burn, nausea or vomiting  PHYSICAL EXAMINATION  VS:  T 97.8       P 86      RR 18      BP 96/82     POX %     WT (Lb)  GENERAL: no acute distress, moderately obese body habitus NECK: supple, trachea midline, no neck masses, no thyroid tenderness, no thyromegaly RESPIRATORY: breathing is even & unlabored, BS CTAB CARDIAC: RRR, no murmur,no extra heart sounds, no edema GI: abdomen soft, normal BS, no masses, no tenderness, no hepatomegaly, no splenomegaly PSYCHIATRIC: the patient is alert & oriented to person, affect & behavior appropriate  LABS/RADIOLOGY: 7/14 serum iron 28, TIBC 233, percent saturation 12, vitamin B12 508, ferritin 221  6/14 WBC 11, hemoglobin 7.5, MCV  92.7, platelets 467, repeat CBC showed hemoglobin 7.7, MCV 91, platelets 454 otherwise CBC normal, albumin 2.8 otherwise liver profile normal 5/14 triglycerides 163, HDL 39 otherwise fasting lipid panel normal  ASSESSMENT/PLAN:  CHF-well compensated. CAD- stable. Hypertension-well controlled. Anemia of chronic disease-continue iron. Constipation-well-controlled. Hyperlipidemia-well-controlled. Anxiety-stable. Restless leg syndrome-Mirapex was increased.  CPT CODE: 09811

## 2013-05-10 ENCOUNTER — Encounter (HOSPITAL_BASED_OUTPATIENT_CLINIC_OR_DEPARTMENT_OTHER): Payer: Medicare Other | Attending: General Surgery

## 2013-05-10 DIAGNOSIS — L89109 Pressure ulcer of unspecified part of back, unspecified stage: Secondary | ICD-10-CM | POA: Insufficient documentation

## 2013-05-10 DIAGNOSIS — L8990 Pressure ulcer of unspecified site, unspecified stage: Secondary | ICD-10-CM | POA: Insufficient documentation

## 2013-05-11 NOTE — H&P (Signed)
[name], [name]              ACCOUNT NO.:  [id number]  MEDICAL RECORD NO.:  [id number]  LOCATION:  FOOT                         FACILITY:  [facility]  PHYSICIAN:  Joanne Gavel, M.D.        DATE OF BIRTH:  Feb 09, 1931  DATE OF ADMISSION:  05/10/2013 DATE OF DISCHARGE:                             HISTORY & PHYSICAL   CHIEF COMPLAINT:  Wound on buttocks.  HISTORY OF PRESENT ILLNESS:  This 79 year old female had a decubitus ulcer, small one.  She then felt had a broken neck and was in rehab for 100 days, this resulted in a much larger decubitus.  The wound has been treated basically with normal saline dressings.  PAST MEDICAL HISTORY:  Significant for hypertension, arthritis, hypertension, neuropathy, esophageal reflux, constipation, spinal stenosis, coronary artery disease, diastolic congestive heart failure, psoriasis, restless legs syndrome,  carpal tunnel syndrome.  SURGERY:  Left knee replacement in 2003, right knee replaced in 1996, back surgery in 2008, gallbladder surgery, remotely and neck surgery in 2014.  SOCIAL HISTORY:  Cigarettes none.  Alcohol none.  MEDICATIONS:  Pravachol, Norco, Lasix, pramipexole, metoprolol, Xanax, aspirin, quinapril, Cardizem, and Aldactone.  ALLERGY:  PENICILLIN and CLONIDINE.  REVIEW OF SYSTEMS:  As above.  PHYSICAL EXAMINATION:  VITAL SIGNS:  Temperature 98.1, pulse 70, respirations 17, blood pressure 172/69. GENERAL APPEARANCE:  A well developed, well nourished, awake, alert, and oriented. CHEST:  Clear. HEART:  Regular rhythm.  There is a 4.0 x 2.5 presacral wound with a great deal of undermining and depth of 1.3.  The sacrum is palpable, but covered with what appears to be healthy tissue.  The wound is quite clean.  IMPRESSION:  Sacral decubitus.  She looks like a good candidate for the VAC.  We will arrange for a VAC and for medical modalities to visit the home to see about bed and wheelchair padding.  We will see her in  7 days.     Joanne Gavel, M.D.     RA/MEDQ  D:  05/10/2013  T:  05/11/2013  Job:  [id number]

## 2013-05-17 ENCOUNTER — Encounter (HOSPITAL_BASED_OUTPATIENT_CLINIC_OR_DEPARTMENT_OTHER): Payer: Medicare Other | Attending: General Surgery

## 2013-05-17 DIAGNOSIS — A4902 Methicillin resistant Staphylococcus aureus infection, unspecified site: Secondary | ICD-10-CM | POA: Insufficient documentation

## 2013-05-17 DIAGNOSIS — Z9089 Acquired absence of other organs: Secondary | ICD-10-CM | POA: Insufficient documentation

## 2013-05-17 DIAGNOSIS — Z79899 Other long term (current) drug therapy: Secondary | ICD-10-CM | POA: Insufficient documentation

## 2013-05-17 DIAGNOSIS — Z7982 Long term (current) use of aspirin: Secondary | ICD-10-CM | POA: Insufficient documentation

## 2013-05-17 DIAGNOSIS — E785 Hyperlipidemia, unspecified: Secondary | ICD-10-CM | POA: Insufficient documentation

## 2013-05-17 DIAGNOSIS — Z96659 Presence of unspecified artificial knee joint: Secondary | ICD-10-CM | POA: Insufficient documentation

## 2013-05-17 DIAGNOSIS — I10 Essential (primary) hypertension: Secondary | ICD-10-CM | POA: Insufficient documentation

## 2013-05-17 DIAGNOSIS — I2510 Atherosclerotic heart disease of native coronary artery without angina pectoris: Secondary | ICD-10-CM | POA: Insufficient documentation

## 2013-05-17 DIAGNOSIS — L98499 Non-pressure chronic ulcer of skin of other sites with unspecified severity: Secondary | ICD-10-CM | POA: Insufficient documentation

## 2013-05-17 DIAGNOSIS — G609 Hereditary and idiopathic neuropathy, unspecified: Secondary | ICD-10-CM | POA: Insufficient documentation

## 2013-05-24 NOTE — Progress Notes (Signed)
[facility]  NAME:  [name], [name]              ACCOUNT NO.:  [id number]  MEDICAL RECORD NO.:  [id number]      DATE OF BIRTH:  05-Aug-1931  PHYSICIAN:  Wayland Denis, DO       VISIT DATE:  05/23/2013                                  OFFICE VISIT   HISTORY:  The patient is an 79 year old female here with her daughter for evaluation of a sacral ulcer.  She has been seeing Dr. Wiliam Ke, and being treated with a VAC.  There has been some improvement, but it has been slow.  She is trying to stay off the area, but she is up in a wheelchair quite a bit during the day.  MEDICATIONS:  Pravachol, Norco, Lasix, pramipexole, metoprolol, Nasonex, Xanax, aspirin, Cardizem, Aldactone, and quinapril.  ALLERGIES:  Her allergies are CLONIDINE and PENICILLIN.  PAST MEDICAL HISTORY:  Positive for hyperlipidemia, hypertension, osteoarthritis, benign essential hypertension, congestive heart failure, coronary artery disease, spinal stenosis, peripheral neuropathy, esophageal reflux, psoriasis, panic attacks, restless legs syndrome, carpal tunnel syndrome, umbilical hernia, constipation, neoplasm.  PAST SURGICAL HISTORY:  Left knee replacement, right knee replacement, back surgery, cholecystectomy, and neck surgery.  She lives at home with her daughter who seems to take good care of her, and is attentive.  She is not smoking.  REVIEW OF SYSTEMS:  Otherwise negative.  PHYSICAL EXAMINATION:  GENERAL:  She is alert, oriented, and cooperative.  She is pleasant. HEENT:  Her pupils were equal.  Her extraocular muscles were intact. NECK:  She does not have any cervical lymphadenopathy. LUNGS:  Her breathing is unlabored. HEART:  Her heart rate is regular.  Her wound is sacral buttock region and has been present for the past several months.  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  We recommend treating her with clindamycin.   It does not appear to need debridement at present.  We did talk about doing a muscle flap, which she is a candidate for, but she would need to stay off of the area for at least 4-6 weeks.  She is not sure if she can do that, but they are going to think about the options, and will talk about it more at her next visit. In the meantime, increase her protein, multivitamin, vitamin C, and zinc and offloading.     Wayland Denis, DO     CS/MEDQ  D:  05/23/2013  T:  05/24/2013  Job:  [id number]

## 2013-06-21 ENCOUNTER — Encounter (HOSPITAL_BASED_OUTPATIENT_CLINIC_OR_DEPARTMENT_OTHER): Payer: Medicare Other | Attending: General Surgery

## 2013-06-21 DIAGNOSIS — L89109 Pressure ulcer of unspecified part of back, unspecified stage: Secondary | ICD-10-CM | POA: Insufficient documentation

## 2013-06-21 DIAGNOSIS — L8993 Pressure ulcer of unspecified site, stage 3: Secondary | ICD-10-CM | POA: Insufficient documentation

## 2013-07-06 ENCOUNTER — Other Ambulatory Visit (HOSPITAL_BASED_OUTPATIENT_CLINIC_OR_DEPARTMENT_OTHER): Payer: Self-pay | Admitting: Internal Medicine

## 2013-07-26 ENCOUNTER — Encounter (HOSPITAL_BASED_OUTPATIENT_CLINIC_OR_DEPARTMENT_OTHER): Payer: Medicare Other | Attending: General Surgery

## 2013-07-26 DIAGNOSIS — L89109 Pressure ulcer of unspecified part of back, unspecified stage: Secondary | ICD-10-CM | POA: Insufficient documentation

## 2013-07-26 DIAGNOSIS — L89309 Pressure ulcer of unspecified buttock, unspecified stage: Secondary | ICD-10-CM | POA: Insufficient documentation

## 2013-07-26 DIAGNOSIS — L8994 Pressure ulcer of unspecified site, stage 4: Secondary | ICD-10-CM | POA: Insufficient documentation

## 2013-08-23 ENCOUNTER — Encounter (HOSPITAL_BASED_OUTPATIENT_CLINIC_OR_DEPARTMENT_OTHER): Payer: Medicare Other | Attending: General Surgery

## 2013-08-23 DIAGNOSIS — L8994 Pressure ulcer of unspecified site, stage 4: Secondary | ICD-10-CM | POA: Insufficient documentation

## 2013-08-23 DIAGNOSIS — L89109 Pressure ulcer of unspecified part of back, unspecified stage: Secondary | ICD-10-CM | POA: Insufficient documentation

## 2013-08-23 NOTE — Progress Notes (Signed)
This encounter was created in error - please disregard.

## 2013-09-15 DIAGNOSIS — I639 Cerebral infarction, unspecified: Secondary | ICD-10-CM

## 2013-09-15 HISTORY — DX: Cerebral infarction, unspecified: I63.9

## 2013-09-20 ENCOUNTER — Encounter (HOSPITAL_BASED_OUTPATIENT_CLINIC_OR_DEPARTMENT_OTHER): Payer: Medicare Other | Attending: General Surgery

## 2013-09-20 DIAGNOSIS — L89109 Pressure ulcer of unspecified part of back, unspecified stage: Secondary | ICD-10-CM | POA: Insufficient documentation

## 2013-09-20 DIAGNOSIS — L8994 Pressure ulcer of unspecified site, stage 4: Secondary | ICD-10-CM | POA: Insufficient documentation

## 2013-09-27 NOTE — Progress Notes (Signed)
This encounter was created in error - please disregard.

## 2013-09-30 ENCOUNTER — Emergency Department (HOSPITAL_COMMUNITY): Payer: Medicare Other

## 2013-09-30 ENCOUNTER — Encounter (HOSPITAL_COMMUNITY): Payer: Self-pay | Admitting: Radiology

## 2013-09-30 ENCOUNTER — Inpatient Hospital Stay (HOSPITAL_COMMUNITY)
Admission: EM | Admit: 2013-09-30 | Discharge: 2013-10-06 | DRG: 064 | Disposition: A | Discharge status: Rehabilitation Facility | Payer: Medicare Other | Attending: Internal Medicine | Admitting: Internal Medicine

## 2013-09-30 ENCOUNTER — Other Ambulatory Visit: Payer: Self-pay | Admitting: Ophthalmology

## 2013-09-30 DIAGNOSIS — L89109 Pressure ulcer of unspecified part of back, unspecified stage: Secondary | ICD-10-CM | POA: Diagnosis present

## 2013-09-30 DIAGNOSIS — I509 Heart failure, unspecified: Secondary | ICD-10-CM

## 2013-09-30 DIAGNOSIS — I63239 Cerebral infarction due to unspecified occlusion or stenosis of unspecified carotid arteries: Principal | ICD-10-CM

## 2013-09-30 DIAGNOSIS — F411 Generalized anxiety disorder: Secondary | ICD-10-CM | POA: Diagnosis present

## 2013-09-30 DIAGNOSIS — Z96659 Presence of unspecified artificial knee joint: Secondary | ICD-10-CM

## 2013-09-30 DIAGNOSIS — D638 Anemia in other chronic diseases classified elsewhere: Secondary | ICD-10-CM | POA: Diagnosis present

## 2013-09-30 DIAGNOSIS — IMO0002 Reserved for concepts with insufficient information to code with codable children: Secondary | ICD-10-CM | POA: Diagnosis present

## 2013-09-30 DIAGNOSIS — I5032 Chronic diastolic (congestive) heart failure: Secondary | ICD-10-CM | POA: Diagnosis present

## 2013-09-30 DIAGNOSIS — I639 Cerebral infarction, unspecified: Secondary | ICD-10-CM

## 2013-09-30 DIAGNOSIS — I6350 Cerebral infarction due to unspecified occlusion or stenosis of unspecified cerebral artery: Secondary | ICD-10-CM

## 2013-09-30 DIAGNOSIS — N179 Acute kidney failure, unspecified: Secondary | ICD-10-CM

## 2013-09-30 DIAGNOSIS — I10 Essential (primary) hypertension: Secondary | ICD-10-CM

## 2013-09-30 DIAGNOSIS — I2510 Atherosclerotic heart disease of native coronary artery without angina pectoris: Secondary | ICD-10-CM

## 2013-09-30 DIAGNOSIS — R4781 Slurred speech: Secondary | ICD-10-CM

## 2013-09-30 DIAGNOSIS — H3320 Serous retinal detachment, unspecified eye: Secondary | ICD-10-CM | POA: Diagnosis present

## 2013-09-30 DIAGNOSIS — M549 Dorsalgia, unspecified: Secondary | ICD-10-CM

## 2013-09-30 DIAGNOSIS — Z79899 Other long term (current) drug therapy: Secondary | ICD-10-CM

## 2013-09-30 DIAGNOSIS — N390 Urinary tract infection, site not specified: Secondary | ICD-10-CM

## 2013-09-30 DIAGNOSIS — G8929 Other chronic pain: Secondary | ICD-10-CM

## 2013-09-30 DIAGNOSIS — E785 Hyperlipidemia, unspecified: Secondary | ICD-10-CM | POA: Diagnosis present

## 2013-09-30 DIAGNOSIS — R931 Abnormal findings on diagnostic imaging of heart and coronary circulation: Secondary | ICD-10-CM

## 2013-09-30 DIAGNOSIS — L8993 Pressure ulcer of unspecified site, stage 3: Secondary | ICD-10-CM | POA: Diagnosis present

## 2013-09-30 DIAGNOSIS — R4789 Other speech disturbances: Secondary | ICD-10-CM

## 2013-09-30 LAB — CBC
HCT: 32.7 % — ABNORMAL LOW (ref 36.0–46.0)
HEMOGLOBIN: 10.6 g/dL — AB (ref 12.0–15.0)
MCH: 29.4 pg (ref 26.0–34.0)
MCHC: 32.4 g/dL (ref 30.0–36.0)
MCV: 90.8 fL (ref 78.0–100.0)
Platelets: 299 10*3/uL (ref 150–400)
RBC: 3.6 MIL/uL — ABNORMAL LOW (ref 3.87–5.11)
RDW: 15.7 % — AB (ref 11.5–15.5)
WBC: 11.7 10*3/uL — ABNORMAL HIGH (ref 4.0–10.5)

## 2013-09-30 LAB — COMPREHENSIVE METABOLIC PANEL
ALBUMIN: 3.5 g/dL (ref 3.5–5.2)
ALT: 26 U/L (ref 0–35)
AST: 32 U/L (ref 0–37)
Alkaline Phosphatase: 137 U/L — ABNORMAL HIGH (ref 39–117)
BUN: 29 mg/dL — AB (ref 6–23)
CO2: 27 mEq/L (ref 19–32)
Calcium: 9.6 mg/dL (ref 8.4–10.5)
Chloride: 101 mEq/L (ref 96–112)
Creatinine, Ser: 0.82 mg/dL (ref 0.50–1.10)
GFR calc Af Amer: 76 mL/min — ABNORMAL LOW (ref >90)
GFR calc non Af Amer: 66 mL/min — ABNORMAL LOW (ref >90)
Glucose, Bld: 98 mg/dL (ref 70–99)
POTASSIUM: 4.3 meq/L (ref 3.7–5.3)
Sodium: 139 mEq/L (ref 137–147)
TOTAL PROTEIN: 7.8 g/dL (ref 6.0–8.3)
Total Bilirubin: 0.4 mg/dL (ref 0.3–1.2)

## 2013-09-30 LAB — DIFFERENTIAL
BASOS ABS: 0.1 10*3/uL (ref 0.0–0.1)
BASOS PCT: 0 % (ref 0–1)
EOS ABS: 0.1 10*3/uL (ref 0.0–0.7)
Eosinophils Relative: 1 % (ref 0–5)
Lymphocytes Relative: 22 % (ref 12–46)
Lymphs Abs: 2.6 10*3/uL (ref 0.7–4.0)
MONOS PCT: 6 % (ref 3–12)
Monocytes Absolute: 0.7 10*3/uL (ref 0.1–1.0)
NEUTROS ABS: 8.3 10*3/uL — AB (ref 1.7–7.7)
Neutrophils Relative %: 70 % (ref 43–77)

## 2013-09-30 LAB — URINALYSIS, ROUTINE W REFLEX MICROSCOPIC
BILIRUBIN URINE: NEGATIVE
GLUCOSE, UA: NEGATIVE mg/dL
KETONES UR: NEGATIVE mg/dL
Nitrite: NEGATIVE
Protein, ur: 100 mg/dL — AB
Specific Gravity, Urine: 1.013 (ref 1.005–1.030)
Urobilinogen, UA: 0.2 mg/dL (ref 0.0–1.0)
pH: 8 (ref 5.0–8.0)

## 2013-09-30 LAB — POCT I-STAT, CHEM 8
BUN: 29 mg/dL — AB (ref 6–23)
CREATININE: 1 mg/dL (ref 0.50–1.10)
Calcium, Ion: 1.28 mmol/L (ref 1.13–1.30)
Chloride: 101 mEq/L (ref 96–112)
GLUCOSE: 99 mg/dL (ref 70–99)
HCT: 35 % — ABNORMAL LOW (ref 36.0–46.0)
Hemoglobin: 11.9 g/dL — ABNORMAL LOW (ref 12.0–15.0)
Potassium: 4.2 mEq/L (ref 3.7–5.3)
Sodium: 140 mEq/L (ref 137–147)
TCO2: 28 mmol/L (ref 0–100)

## 2013-09-30 LAB — RAPID URINE DRUG SCREEN, HOSP PERFORMED
AMPHETAMINES: NOT DETECTED
BENZODIAZEPINES: NOT DETECTED
Barbiturates: NOT DETECTED
Cocaine: NOT DETECTED
OPIATES: NOT DETECTED
TETRAHYDROCANNABINOL: NOT DETECTED

## 2013-09-30 LAB — POCT I-STAT TROPONIN I: Troponin i, poc: 0 ng/mL (ref 0.00–0.08)

## 2013-09-30 LAB — URINE MICROSCOPIC-ADD ON

## 2013-09-30 LAB — APTT: APTT: 29 s (ref 24–37)

## 2013-09-30 LAB — GLUCOSE, CAPILLARY: Glucose-Capillary: 93 mg/dL (ref 70–99)

## 2013-09-30 LAB — PROTIME-INR
INR: 1.02 (ref 0.00–1.49)
Prothrombin Time: 13.2 seconds (ref 11.6–15.2)

## 2013-09-30 LAB — TROPONIN I: Troponin I: <0.3 ng/mL (ref <0.30)

## 2013-09-30 LAB — ETHANOL

## 2013-09-30 MED ORDER — ALPRAZOLAM 0.25 MG PO TABS
0.2500 mg | ORAL_TABLET | Freq: Every evening | ORAL | Status: DC | PRN
  Administered 2013-09-30 – 2013-10-02 (×3): 0.25 mg via ORAL
  Filled 2013-09-30 (×3): qty 1

## 2013-09-30 MED ORDER — ASPIRIN 325 MG PO TABS
325.0000 mg | ORAL_TABLET | Freq: Every day | ORAL | Status: DC
  Administered 2013-09-30 – 2013-10-06 (×7): 325 mg via ORAL
  Filled 2013-09-30 (×7): qty 1

## 2013-09-30 MED ORDER — CIPROFLOXACIN HCL 0.3 % OP SOLN
1.0000 [drp] | Freq: Four times a day (QID) | OPHTHALMIC | Status: DC
  Administered 2013-10-01 – 2013-10-06 (×22): 1 [drp] via OPHTHALMIC
  Filled 2013-09-30: qty 2.5

## 2013-09-30 MED ORDER — ENOXAPARIN SODIUM 40 MG/0.4ML ~~LOC~~ SOLN
40.0000 mg | SUBCUTANEOUS | Status: DC
  Administered 2013-09-30 – 2013-10-05 (×6): 40 mg via SUBCUTANEOUS
  Filled 2013-09-30 (×7): qty 0.4

## 2013-09-30 MED ORDER — IOHEXOL 350 MG/ML SOLN
50.0000 mL | Freq: Once | INTRAVENOUS | Status: AC | PRN
  Administered 2013-09-30: 50 mL via INTRAVENOUS

## 2013-09-30 MED ORDER — FUROSEMIDE 20 MG PO TABS: 20.0000 mg | ORAL_TABLET | Freq: Every day | ORAL | Status: DC

## 2013-09-30 MED ORDER — SENNOSIDES-DOCUSATE SODIUM 8.6-50 MG PO TABS: 1.0000 | ORAL_TABLET | Freq: Two times a day (BID) | ORAL | Status: DC

## 2013-09-30 MED ORDER — PRAMIPEXOLE DIHYDROCHLORIDE 0.25 MG PO TABS
0.2500 mg | ORAL_TABLET | Freq: Three times a day (TID) | ORAL | Status: DC | PRN
  Administered 2013-09-30 – 2013-10-06 (×8): 0.25 mg via ORAL
  Filled 2013-09-30 (×5): qty 1

## 2013-09-30 MED ORDER — LISINOPRIL 20 MG PO TABS: 20.0000 mg | ORAL_TABLET | Freq: Every day | ORAL | Status: DC

## 2013-09-30 MED ORDER — LABETALOL HCL 5 MG/ML IV SOLN: 10.0000 mg | INTRAVENOUS | Status: DC | PRN

## 2013-09-30 MED ORDER — ASPIRIN 300 MG RE SUPP
300.0000 mg | Freq: Every day | RECTAL | Status: DC
  Filled 2013-09-30 (×7): qty 1

## 2013-09-30 MED ORDER — METOPROLOL TARTRATE 50 MG PO TABS
50.0000 mg | ORAL_TABLET | Freq: Two times a day (BID) | ORAL | Status: DC
Start: 2013-09-30 — End: 2013-10-06
  Administered 2013-09-30 – 2013-10-06 (×12): 50 mg via ORAL
  Filled 2013-09-30 (×13): qty 1

## 2013-09-30 MED ORDER — ATORVASTATIN CALCIUM 40 MG PO TABS
40.0000 mg | ORAL_TABLET | Freq: Every day | ORAL | Status: DC
  Administered 2013-09-30 – 2013-10-06 (×7): 40 mg via ORAL
  Filled 2013-09-30 (×8): qty 1

## 2013-09-30 MED ORDER — PANTOPRAZOLE SODIUM 40 MG PO TBEC
40.0000 mg | DELAYED_RELEASE_TABLET | Freq: Every day | ORAL | Status: DC
  Administered 2013-09-30 – 2013-10-06 (×7): 40 mg via ORAL
  Filled 2013-09-30 (×7): qty 1

## 2013-09-30 MED ORDER — PRAMIPEXOLE DIHYDROCHLORIDE 0.25 MG PO TABS
0.2500 mg | ORAL_TABLET | Freq: Every day | ORAL | Status: DC
  Administered 2013-09-30 – 2013-10-05 (×5): 0.25 mg via ORAL
  Filled 2013-09-30 (×9): qty 1

## 2013-09-30 MED ORDER — POLYETHYLENE GLYCOL 3350 17 GM/SCOOP PO POWD: 17.0000 g | Freq: Every day | ORAL | Status: DC

## 2013-09-30 MED ORDER — HYDROMORPHONE HCL PF 1 MG/ML IJ SOLN
1.0000 mg | INTRAMUSCULAR | Status: DC | PRN
  Administered 2013-09-30 – 2013-10-05 (×14): 1 mg via INTRAVENOUS
  Filled 2013-09-30 (×14): qty 1

## 2013-09-30 MED ORDER — PREDNISOLONE ACETATE 1 % OP SUSP
1.0000 [drp] | Freq: Four times a day (QID) | OPHTHALMIC | Status: DC
  Administered 2013-10-01 – 2013-10-06 (×22): 1 [drp] via OPHTHALMIC
  Filled 2013-09-30: qty 1

## 2013-09-30 MED ORDER — ACETAMINOPHEN 325 MG PO TABS
650.0000 mg | ORAL_TABLET | ORAL | Status: DC | PRN
  Administered 2013-10-02 – 2013-10-03 (×2): 650 mg via ORAL
  Filled 2013-09-30 (×2): qty 2

## 2013-09-30 MED ORDER — MORPHINE SULFATE 2 MG/ML IJ SOLN
2.0000 mg | INTRAMUSCULAR | Status: DC | PRN
  Administered 2013-09-30 – 2013-10-04 (×2): 2 mg via INTRAVENOUS
  Filled 2013-09-30 (×2): qty 1

## 2013-09-30 MED ORDER — DORZOLAMIDE HCL-TIMOLOL MAL 2-0.5 % OP SOLN
1.0000 [drp] | Freq: Two times a day (BID) | OPHTHALMIC | Status: DC
  Administered 2013-10-01 – 2013-10-06 (×12): 1 [drp] via OPHTHALMIC
  Filled 2013-09-30: qty 10

## 2013-09-30 NOTE — ED Notes (Signed)
Patient had right eye surgery one day ago for retinal detachment.  [facility]  [facility] ten seconds and woke up with garbled speech per EMS.

## 2013-09-30 NOTE — Plan of Care (Signed)
md zamora paged because pt is still hurting a lot on bil legs due to restless syndrome. Morphine and mirapex given earlier. Nothing else nurse can give pt. Waiting for md to call back.

## 2013-09-30 NOTE — Code Documentation (Signed)
80[facility]  EMS reports that the patient was at the eye doctor's office when she became sleepy for about 10 minutes during her exam and then developed sudden onset confusion and garbled speech.  She was at a follow up appointment s/p right eye surgery yesterday for retinal detachment.  Patient lives at home with a sitter and needs assistance with ADLs, history of CAD and HTN.  Patient taken to CT on arrival.  NIHSS 7 on arrival, see documentation for details.  Will not treat with tPA per Dr. Thad Rangereynolds d/t eye surgery yesterday.  Patient taken to CTA.  No acute stroke treatment at this time. Bedside handoff with ED RN Hayley.

## 2013-09-30 NOTE — Plan of Care (Signed)
Pt arrived to the unit via stretcher. Pt is alert and oriented to self. Oriented to room. Call bell within reach and func. Safety

## 2013-09-30 NOTE — H&P (Signed)
[facility] History and Physical  [name] YNW:[id number] DOB: 24-Jan-1931 DOA: 09/30/2013  Referring physician:  PCP: Astrid DivineGRIFFIN,ELAINE COLLINS, MD   Chief Complaint: Slurred speech  HPI: [name] is a 80 y.o. female with a past medical history of hypertension, coronary artery disease, dyslipidemia who underwent vitrectomy on 09/29/2013, presenting to her ophthalmologist office today for a followup visit. She had otherwise been in her usual state health, when she developed sudden onset mental status changes at the Ophthalmology office, described by her family members as initially having a blank stare. This occurred for 20-30 seconds after which she was noted to be confused and disoriented, unable to follow commands or speak. There was no loss of consciousness during this time. After this she was noted to have slurred speech and difficulties getting words out. In the emergency room she is able to follow commands however continues to have difficulties with getting words out. Initial CT scan of brain  did not show acute intracranial abnormalities. Patient was seen and evaluate by neurology while in the emergency department. She had been on aspirin 81 mg by mouth daily prior to this. She does not have a history of CVA or TIA. She denies shortness of breath, cough, fevers, chills, nausea, vomiting, head trauma, falls, prior history of encephalopathy,  abdominal pain,  Dysuria,  Hematuria, or lower extremity swelling.                                                                                                                                      Review of Systems:  Constitutional:  No weight loss, night sweats, Fevers, chills, fatigue.  HEENT:  No headaches, Difficulty swallowing,Tooth/dental problems,Sore throat,  No sneezing, itching, ear ache, nasal congestion, post nasal drip,  Cardio-vascular:  No chest pain, Orthopnea, PND, swelling in lower extremities, anasarca, dizziness,  palpitations  GI:  No heartburn, indigestion, abdominal pain, nausea, vomiting, diarrhea, change in bowel habits, loss of appetite  Resp:  No shortness of breath with exertion or at rest. No excess mucus, no productive cough, No non-productive cough, No coughing up of blood.No change in color of mucus.No wheezing.No chest wall deformity  Skin:  no rash or lesions.  GU:  no dysuria, change in color of urine, no urgency or frequency. No flank pain.  Musculoskeletal:  . No decreased range of motion. Positive for back pain, joint pain  Psych:  No change in mood or affect. No depression or anxiety. No memory loss.   Past Medical History  Diagnosis Date  . CHF (congestive heart failure)   . Coronary artery disease   . Hypertension   . Arthritis   . Edema of extremities   . Anxiety   . HLD (hyperlipidemia)    Past Surgical History  Procedure Laterality Date  . Total knee arthroplasty Bilateral     knee replacements  . Lumbar disc surgery    . Cholecystectomy    .  Posterior cervical fusion/foraminotomy N/A 12/28/2012    Procedure: POSTERIOR CERVICAL FUSION/FORAMINOTOMY LEVEL 3;  Surgeon: Barnett Abu, MD;  Location: [facility];  Service: Neurosurgery;  Laterality: N/A;  Posterior Cervical Three-Six Laminectomy and Fusion  . Angioplasty    . Tonsillectomy     Social History:  reports that she has never smoked. She has never used smokeless tobacco. She reports that she drinks alcohol. She reports that she does not use illicit drugs.  Allergies  Allergen Reactions  . Penicillins     Family History  Problem Relation Age of Onset  . Dementia Sister     x 3  . CAD Brother   . Breast cancer Sister     x 3  . Heart disease Sister   . Heart disease Son     x 2  . Alzheimer's disease Sister      Prior to Admission medications   Medication Sig Start Date End Date Taking? Authorizing Provider  acetaminophen (TYLENOL) 325 MG tablet Take 650 mg by mouth every 4 (four) hours as  needed for pain.    Historical Provider, MD  ALPRAZolam Prudy Feeler) 0.25 MG tablet Take 1 tablet every night at bedtime for anxiety 01/21/13   Lucrezia Starch, NP  aspirin EC 81 MG tablet Take 81 mg by mouth daily.    Historical Provider, MD  atorvastatin (LIPITOR) 10 MG tablet Take 10 mg by mouth daily.    Historical Provider, MD  ferrous sulfate 325 (65 FE) MG tablet Take 325 mg by mouth 2 (two) times daily.    Historical Provider, MD  furosemide (LASIX) 20 MG tablet Take 1 tablet (20 mg total) by mouth daily. 01/02/13   Meredeth Ide, MD  HYDROcodone-acetaminophen (NORCO) 10-325 MG per tablet Take 1 tablets every 6 hours as needed for mild pain(1-4) Take 2 tablets every 6 hours as needed for moderate to severe pain(5-10). 03/21/13   Tiffany L Reed, DO  lisinopril (PRINIVIL,ZESTRIL) 20 MG tablet Take 1 tablet (20 mg total) by mouth daily. 01/02/13   Meredeth Ide, MD  loratadine (CLARITIN) 10 MG tablet Take 10 mg by mouth as needed for allergies.    Historical Provider, MD  methocarbamol (ROBAXIN) 500 MG tablet Take 1 tablet (500 mg total) by mouth every 6 (six) hours as needed. 01/02/13   Meredeth Ide, MD  metoprolol (LOPRESSOR) 50 MG tablet Take 50 mg by mouth 2 (two) times daily.    Historical Provider, MD  MOVIPREP 100 G SOLR Use per prep instructions 03/14/13   Iva Boop, MD  Multiple Vitamin (MULTIVITAMIN WITH MINERALS) TABS Take 1 tablet by mouth daily.    Historical Provider, MD  polyethylene glycol powder (GLYCOLAX/MIRALAX) powder Take 17 g by mouth daily.    Historical Provider, MD  pramipexole (MIRAPEX) 0.125 MG tablet Take 0.25 mg by mouth at bedtime.    Historical Provider, MD  promethazine (PHENERGAN) 25 MG tablet Take 25 mg by mouth every 8 (eight) hours as needed for nausea.    Historical Provider, MD  Protein (PROCEL) POWD Take 1 scoop by mouth 3 (three) times daily.    Historical Provider, MD  senna-docusate (SENOKOT-S) 8.6-50 MG per tablet Take 1 tablet by mouth 2 (two) times daily. 01/02/13    Meredeth Ide, MD   Physical Exam: Filed Vitals:   09/30/13 1244  BP:   Pulse:   Temp: 97.6 F (36.4 C)  Resp:     BP 178/87  Pulse 72  Temp(Src) 97.6  F (36.4 C) (Oral)  Resp 15  SpO2 99%  General:  Appears calm and comfortable, although having difficulties getting words out, dysphasia Eyes: Right pupil 4 mm in diameter, left pupil 3 mm in diameter. Sluggish pupillary reflex to right, left pupil reactive to light. Extraocular movement is intact Neck: no LAD, masses or thyromegaly Cardiovascular: RRR, no m/r/g. No LE edema. Telemetry: SR, no arrhythmias  Respiratory: CTA bilaterally, no w/r/r. Normal respiratory effort. She has bibasilar crackles Abdomen: soft, ntnd Skin: no rash or induration seen on limited exam Musculoskeletal: grossly normal tone BUE/BLE Psychiatric: grossly normal mood and affect, speech fluent and appropriate Neurologic: Patient is dysphasia, difficulties getting words out, inappropriate responses to questions at times. She does not have facial droop or tongue deviation. Extraocular movement was intact, no alteration to sensation over face. Upper extremities having 5 out of 5 muscle strength 2+ deep tendon reflexes. Lower extremities bilateral 5 of 5 muscle strength 2+ deep tendon reflexes. Negative Babinski, heel to toe intact, no cerebellar signs           Labs on Admission:  Basic Metabolic Panel:  Recent Labs Lab 09/30/13 1102 09/30/13 1111  NA 139 140  K 4.3 4.2  CL 101 101  CO2 27  --   GLUCOSE 98 99  BUN 29* 29*  CREATININE 0.82 1.00  CALCIUM 9.6  --    Liver Function Tests:  Recent Labs Lab 09/30/13 1102  AST 32  ALT 26  ALKPHOS 137*  BILITOT 0.4  PROT 7.8  ALBUMIN 3.5   No results found for this basename: LIPASE, AMYLASE,  in the last 168 hours No results found for this basename: AMMONIA,  in the last 168 hours CBC:  Recent Labs Lab 09/30/13 1102 09/30/13 1111  WBC 11.7*  --   NEUTROABS 8.3*  --   HGB 10.6* 11.9*   HCT 32.7* 35.0*  MCV 90.8  --   PLT 299  --    Cardiac Enzymes:  Recent Labs Lab 09/30/13 1102  TROPONINI <0.30    BNP (last 3 results) No results found for this basename: PROBNP,  in the last 8760 hours CBG:  Recent Labs Lab 09/30/13 1203  GLUCAP 93    Radiological Exams on Admission: Ct Angio Head W/cm &/or Wo Cm  09/30/2013   CLINICAL DATA:  Right eye surgery 1 day ago for retinal disc abdomen. Developed garbled speech and possible syncopal episode  EXAM: CT ANGIOGRAPHY HEAD AND NECK  TECHNIQUE: Multidetector CT imaging of the head and neck was performed using the standard protocol during bolus administration of intravenous contrast. Multiplanar CT image reconstructions including MIPs were obtained to evaluate the vascular anatomy. Carotid stenosis measurements (when applicable) are obtained utilizing NASCET criteria, using the distal internal carotid diameter as the denominator.  CONTRAST:  50mL OMNIPAQUE IOHEXOL 350 MG/ML SOLN  COMPARISON:  Noncontrast CT head earlier today.  FINDINGS: CTA HEAD FINDINGS  Moderate cerebral and cerebellar atrophy. No cortical hypodensity. No abnormal postcontrast enhancement. Biparietal thinning. Major dural venous sinuses patent. Postop changes in the right globe.  50% cavernous ICA stenosis on the right. Calcific non stenotic atheromatous change in the cavernous region on the left. Widely patent supraclinoid ICAs bilaterally. Widely patent basilar artery with right vertebral dominant. Left vertebral ends in PICA. No intracranial stenosis or occlusion is evident.  Review of the MIP images confirms the above findings.  CTA NECK FINDINGS  Transverse arch heavily calcified with some mural thrombus proximally. Conventional branching of great vessels  from the arch. Moderate atheromatous change at the right and left subclavian origin. Right vertebral dominant with left vertebral hypoplastic.  Heavily calcified plaque at the right carotid bifurcation extending  into the proximal right internal carotid artery. Ratio of 2.9/ 4.6 proximal/distal is consistent with a less than 50% stenosis. No ulceration or soft plaque. No evidence for dissection.  Extensive heavily calcified left carotid bifurcation with severe luminal narrowing. Ratio of 0.8/5.2 proximal/distal consistent with a 80-90% stenosis. No ulceration or soft plaque. No evidence for dissection.  The patient is status post C3- C6 posterior fusion with laminectomy. Moderate anterolisthesis T1 on T2 is degenerative in nature. There is healing of the previously identified C5 fracture with restoration of normal alignment. Instrumentation grossly intact. There is fusion at least across the C3-4 interspace. There is nonunion of a type 2 odontoid fracture with mild canal stenosis on the right but no apparent vertebral artery compromise. No lung apex lesion. No neck masses. No osseous destructive lesion although skeletal osteopenia is noted.  Review of the MIP images confirms the above findings.  IMPRESSION: 80-90% heavily calcified left internal carotid artery stenosis. This is potentially flow reducing.  No intracranial stenosis, occlusion, or visible acute infarct.  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 No apparent vertebral artery compromise.  Previous C3 through C6 fusion with healing of C5 fracture.  Vascular Findings reviewed with ordering provider.   Electronically Signed   By: Davonna Belling M.D.   On: 09/30/2013 12:47   Ct Head Wo Contrast  09/30/2013   CLINICAL DATA:  Code stroke.  EXAM: CT HEAD WITHOUT CONTRAST  TECHNIQUE: Contiguous axial images were obtained from the base of the skull through the vertex without intravenous contrast.  COMPARISON:  12/25/2012  FINDINGS: There is no evidence of intra-axial nor extra-axial fluid collections, no evidence of acute hemorrhage. Diffuse cortical atrophy is identified. Mild areas of low attenuation project within the subcortical, deep  and periventricular white matter regions. Ventricles and cisterns are patent. There is no evidence of subfalcine or tonsillar herniation. Iatrogenic changes are appreciated within the right globe consistent with recent surgical intervention. There is mild mucosal thickening within the ethmoid air cells. Remaining visualized paranasal sinuses and mastoid air cells are patent. There is no evidence of a depressed skull fracture. The bones are osteopenic.  IMPRESSION: Involutional and chronic changes without evidence of acute intracranial abnormalities. Iatrogenic changes are appreciated within the right globe. These results were called by telephone at the time of interpretation on 09/30/2013 at 11:35 AM to Dr. Roseanne Reno, who verbally acknowledged these results.   Electronically Signed   By: Salome Holmes M.D.   On: 09/30/2013 11:36   Ct Angio Neck W/cm &/or Wo/cm  09/30/2013   CLINICAL DATA:  Right eye surgery 1 day ago for retinal disc abdomen. Developed garbled speech and possible syncopal episode  EXAM: CT ANGIOGRAPHY HEAD AND NECK  TECHNIQUE: Multidetector CT imaging of the head and neck was performed using the standard protocol during bolus administration of intravenous contrast. Multiplanar CT image reconstructions including MIPs were obtained to evaluate the vascular anatomy. Carotid stenosis measurements (when applicable) are obtained utilizing NASCET criteria, using the distal internal carotid diameter as the denominator.  CONTRAST:  50mL OMNIPAQUE IOHEXOL 350 MG/ML SOLN  COMPARISON:  Noncontrast CT head earlier today.  FINDINGS: CTA HEAD FINDINGS  Moderate cerebral and cerebellar atrophy. No cortical hypodensity. No abnormal postcontrast enhancement. Biparietal thinning. Major dural venous sinuses patent. Postop changes in  the right globe.  50% cavernous ICA stenosis on the right. Calcific non stenotic atheromatous change in the cavernous region on the left. Widely patent supraclinoid ICAs bilaterally.  Widely patent basilar artery with right vertebral dominant. Left vertebral ends in PICA. No intracranial stenosis or occlusion is evident.  Review of the MIP images confirms the above findings.  CTA NECK FINDINGS  Transverse arch heavily calcified with some mural thrombus proximally. Conventional branching of great vessels from the arch. Moderate atheromatous change at the right and left subclavian origin. Right vertebral dominant with left vertebral hypoplastic.  Heavily calcified plaque at the right carotid bifurcation extending into the proximal right internal carotid artery. Ratio of 2.9/ 4.6 proximal/distal is consistent with a less than 50% stenosis. No ulceration or soft plaque. No evidence for dissection.  Extensive heavily calcified left carotid bifurcation with severe luminal narrowing. Ratio of 0.8/5.2 proximal/distal consistent with a 80-90% stenosis. No ulceration or soft plaque. No evidence for dissection.  The patient is status post C3- C6 posterior fusion with laminectomy. Moderate anterolisthesis T1 on T2 is degenerative in nature. There is healing of the previously identified C5 fracture with restoration of normal alignment. Instrumentation grossly intact. There is fusion at least across the C3-4 interspace. There is nonunion of a type 2 odontoid fracture with mild canal stenosis on the right but no apparent vertebral artery compromise. No lung apex lesion. No neck masses. No osseous destructive lesion although skeletal osteopenia is noted.  Review of the MIP images confirms the above findings.  IMPRESSION: 80-90% heavily calcified left internal carotid artery stenosis. This is potentially flow reducing.  No intracranial stenosis, occlusion, or visible acute infarct.  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 No apparent vertebral artery compromise.  Previous C3 through C6 fusion with healing of C5 fracture.  Vascular Findings reviewed with ordering provider.    Electronically Signed   By: Davonna BellingJohn  Curnes M.D.   On: 09/30/2013 12:47    EKG: Independently reviewed. Sinus rhythm  Assessment/Plan Active Problems:   CVA (cerebral infarction)   Slurred speech   Coronary artery disease   Hypertension   1. Probable acute CVA. Patient presenting with dysphasia, sudden onset occurred earlier today. On presentation she was not found to be TPA candidate, although was seen and evaluated by neurology. Will increase her aspirin from 81 mg by mouth daily to 325 mg by mouth daily. Admitted to telemetry place her on the stroke protocol. Obtain an MRI of brain, transthoracic echocardiogram, carotid Dopplers. Start statin therapy with Lipitor 40 mg by mouth daily, followup on fasting lipid panel as well as hemoglobin A1c. Physical therapy, occupational therapy, speech path pathology and social work consult. Await further recommendations from neurology. 2. Hypertension. Patient presented with systolic blood pressures in the 170s to 180s. Will allow for permissive hypertension to favor cerebral perfusion in setting of stroke. 3. Dyslipidemia. Check a fasting lipid panel, start statin therapy with Lipitor 40 mg by mouth daily 4. Coronary artery disease. Stable, does not appear to have active cardiac issues. She denies chest pain, troponin negative with EKG not revealing ischemic changes. Continue antiplatelet therapy, beta blocker, statin. 5. Status post vitrectomy. Patient seen by her ophthalmologist today in the office 6. DVT prophylaxis. Lovenox     Code Status: Full code Family Communication: I spoke with patient's daughter at bedside Disposition Plan: Will admit patient to the inpatient service, I anticipate she will require greater than 2 night hospitalization  Time spent: 6870  minutes  Jeralyn Bennett [facility] Pager [phone]

## 2013-09-30 NOTE — ED Notes (Signed)
CBG 93 

## 2013-09-30 NOTE — ED Notes (Signed)
CBG check of 93 completed

## 2013-09-30 NOTE — Plan of Care (Signed)
Unable to do admission because pt is in a lot of pain. Night nurse notified. When pt pain was under control, nurse was busy. Tried to call admission RN but line was busy.

## 2013-09-30 NOTE — ED Notes (Signed)
Dr. Thad Rangereynolds back in with the patient and family

## 2013-09-30 NOTE — ED Notes (Signed)
Pt placed on bedpan for a few minutes.

## 2013-09-30 NOTE — Consult Note (Signed)
Referring Physician: Ward    Chief Complaint: Sudden on set of aphasia.   HPI:                                                                                                                                         [name] is an 80 y.o. female who underwent a vitrectomy yesterday by the retinal specialist and was returning for follow up this AM.  She was normal and at baseline when entered the office.  Midway through exam she was noted to have a sudden onset confusion, inability to follow commands or express herself. She was not showing any lateralizing deficits.  She was brought to [facility] hospital as a code stroke on arrival she continues to show confusion, and inability to express or follow commands.  Patient had a cervical fracture due to a fall out of bed in [place] of 2014.  Since that time she has been in a wheelchair for the most part, although she can walk some with assistance.  She has some resultant left sided weakness and had required assistance for most ADL's.  Date last known well: Date: 09/30/2013 Time last known well: Time: 10:45 tPA Given: No: recent surgery NIHSS 7 mRankin 3  Past Medical History  Diagnosis Date  . CHF (congestive heart failure)   . Coronary artery disease   . Hypertension   . Arthritis   . Edema of extremities   . Anxiety   . HLD (hyperlipidemia)     Past Surgical History  Procedure Laterality Date  . Total knee arthroplasty Bilateral     knee replacements  . Lumbar disc surgery    . Cholecystectomy    . Posterior cervical fusion/foraminotomy N/A 12/28/2012    Procedure: POSTERIOR CERVICAL FUSION/FORAMINOTOMY LEVEL 3;  Surgeon: Kristeen Miss, MD;  Location: [facility];  Service: Neurosurgery;  Laterality: N/A;  Posterior Cervical Three-Six Laminectomy and Fusion  . Angioplasty    . Tonsillectomy      Family History  Problem Relation Age of Onset  . Dementia Sister     x 3  . CAD Brother   . Breast cancer Sister     x 3  . Heart  disease Sister   . Heart disease Son     x 2  . Alzheimer's disease Sister    Social History:  reports that she has never smoked. She has never used smokeless tobacco. She reports that she drinks alcohol. She reports that she does not use illicit drugs.  Allergies:  Allergies  Allergen Reactions  . Penicillins     Medications:  No current facility-administered medications for this encounter.   Current Outpatient Prescriptions  Medication Sig Dispense Refill  . acetaminophen (TYLENOL) 325 MG tablet Take 650 mg by mouth every 4 (four) hours as needed for pain.      Marland Kitchen ALPRAZolam (XANAX) 0.25 MG tablet Take 1 tablet every night at bedtime for anxiety  30 tablet  5  . aspirin EC 81 MG tablet Take 81 mg by mouth daily.      Marland Kitchen atorvastatin (LIPITOR) 10 MG tablet Take 10 mg by mouth daily.      . ferrous sulfate 325 (65 FE) MG tablet Take 325 mg by mouth 2 (two) times daily.      . furosemide (LASIX) 20 MG tablet Take 1 tablet (20 mg total) by mouth daily.  30 tablet  0  . HYDROcodone-acetaminophen (NORCO) 10-325 MG per tablet Take 1 tablets every 6 hours as needed for mild pain(1-4) Take 2 tablets every 6 hours as needed for moderate to severe pain(5-10).  240 tablet  5  . lisinopril (PRINIVIL,ZESTRIL) 20 MG tablet Take 1 tablet (20 mg total) by mouth daily.  30 tablet  0  . loratadine (CLARITIN) 10 MG tablet Take 10 mg by mouth as needed for allergies.      . methocarbamol (ROBAXIN) 500 MG tablet Take 1 tablet (500 mg total) by mouth every 6 (six) hours as needed.  30 tablet  0  . metoprolol (LOPRESSOR) 50 MG tablet Take 50 mg by mouth 2 (two) times daily.      Marland Kitchen MOVIPREP 100 G SOLR Use per prep instructions  1 kit  0  . Multiple Vitamin (MULTIVITAMIN WITH MINERALS) TABS Take 1 tablet by mouth daily.      . polyethylene glycol powder (GLYCOLAX/MIRALAX) powder Take 17 g by  mouth daily.      . pramipexole (MIRAPEX) 0.125 MG tablet Take 0.25 mg by mouth at bedtime.      . promethazine (PHENERGAN) 25 MG tablet Take 25 mg by mouth every 8 (eight) hours as needed for nausea.      . Protein (PROCEL) POWD Take 1 scoop by mouth 3 (three) times daily.      Marland Kitchen senna-docusate (SENOKOT-S) 8.6-50 MG per tablet Take 1 tablet by mouth 2 (two) times daily.  30 tablet  0     ROS:                                                                                                                                       History obtained from son and daughter  General ROS: negative for - chills, fatigue, fever, night sweats, weight gain or weight loss Psychological ROS: negative for - behavioral disorder, hallucinations, memory difficulties, mood swings or suicidal ideation Ophthalmic ROS: negative for - blurry vision, double vision, eye pain or loss of vision ENT ROS: negative for - epistaxis, nasal discharge, oral lesions, sore throat, tinnitus  or vertigo Allergy and Immunology ROS: negative for - hives or itchy/watery eyes Hematological and Lymphatic ROS: negative for - bleeding problems, bruising or swollen lymph nodes Endocrine ROS: negative for - galactorrhea, hair pattern changes, polydipsia/polyuria or temperature intolerance Respiratory ROS: negative for - cough, hemoptysis, shortness of breath or wheezing Cardiovascular ROS: negative for - chest pain, dyspnea on exertion, edema or irregular heartbeat Gastrointestinal ROS: negative for - abdominal pain, diarrhea, hematemesis, nausea/vomiting or stool incontinence Genito-Urinary ROS: negative for - dysuria, hematuria, incontinence or urinary frequency/urgency Musculoskeletal ROS: negative for - joint swelling or muscular weakness Neurological ROS: as noted in HPI Dermatological ROS: negative for rash and skin lesion changes  Neurologic Examination:                                                                                                       Blood pressure 178/87, pulse 72, temperature 97.6 F (36.4 C), temperature source Oral, resp. rate 15, SpO2 99.00%.  Mental Status: Alert, initially not able to follow verbal commands or visual commands.  Speech was clear but broken. Later in exam she was able one to two verbal commands such as showing two fingers and closing her eyes but still could not follow commands consistently or follow complex commands.  Cranial Nerves: II: Discs flat bilaterally; Visual fields grossly normal, pupils  52m on the right and 2 mm on the left.  The left is reactive and right is non reactive (surgical eye), round, III,IV, VI: ptosis present right, extra-ocular motions intact bilaterally with greater movement noted when looking to the left V,VII: smile symmetric, slight right NL  Decrease on the right  VIII: hearing normal bilaterally IX,X: gag reflex present XI: bilateral shoulder shrug XII: midline tongue extension without atrophy or fasciculations  Motor: Moving all extremities antigravity equally with no drift Tone and bulk:normal tone throughout; no atrophy noted Sensory: withdraws to gross noxious stimuli bilateral legs and arms Deep Tendon Reflexes:  Right: Upper Extremity   Left: Upper extremity   biceps (C-5 to C-6) 2/4   biceps (C-5 to C-6) 2/4 tricep (C7) 2/4    triceps (C7) 2/4 Brachioradialis (C6) 2/4  Brachioradialis (C6) 2/4  Lower Extremity Lower Extremity  quadriceps (L-2 to L-4) 2/4   quadriceps (L-2 to L-4) 2/4 Achilles (S1) 0/4   Achilles (S1) 0/4  Plantars: Right: downgoing   Left: downgoing Cerebellar: Unable to assess due to aphasia Gait: not tested CV: pulses palpable throughout    Lab Results: Basic Metabolic Panel:  Recent Labs Lab 09/30/13 1102 09/30/13 1111  NA 139 140  K 4.3 4.2  CL 101 101  CO2 27  --   GLUCOSE 98 99  BUN 29* 29*  CREATININE 0.82 1.00  CALCIUM 9.6  --     Liver Function Tests:  Recent Labs Lab 09/30/13 1102   AST 32  ALT 26  ALKPHOS 137*  BILITOT 0.4  PROT 7.8  ALBUMIN 3.5   No results found for this basename: LIPASE, AMYLASE,  in the last 168 hours No results found for this basename: AMMONIA,  in the last 168 hours  CBC:  Recent Labs Lab 09/30/13 1102 09/30/13 1111  WBC 11.7*  --   NEUTROABS 8.3*  --   HGB 10.6* 11.9*  HCT 32.7* 35.0*  MCV 90.8  --   PLT 299  --     Cardiac Enzymes:  Recent Labs Lab 09/30/13 1102  TROPONINI <0.30    Lipid Panel: No results found for this basename: CHOL, TRIG, HDL, CHOLHDL, VLDL, LDLCALC,  in the last 168 hours  CBG:  Recent Labs Lab 09/30/13 1203  GLUCAP 93    Microbiology: Results for orders placed during the hospital encounter of 12/25/12  MRSA PCR SCREENING     Status: None   Collection Time    12/28/12  8:01 AM      Result Value Range Status   MRSA by PCR NEGATIVE  NEGATIVE Final   Comment:            The GeneXpert MRSA Assay (FDA     approved for NASAL specimens     only), is one component of a     comprehensive MRSA colonization     surveillance program. It is not     intended to diagnose MRSA     infection nor to guide or     monitor treatment for     MRSA infections.    Coagulation Studies:  Recent Labs  09/30/13 1102  LABPROT 13.2  INR 1.02    Imaging: Ct Angio Head W/cm &/or Wo Cm  09/30/2013   CLINICAL DATA:  Right eye surgery 1 day ago for retinal disc abdomen. Developed garbled speech and possible syncopal episode  EXAM: CT ANGIOGRAPHY HEAD AND NECK  TECHNIQUE: Multidetector CT imaging of the head and neck was performed using the standard protocol during bolus administration of intravenous contrast. Multiplanar CT image reconstructions including MIPs were obtained to evaluate the vascular anatomy. Carotid stenosis measurements (when applicable) are obtained utilizing NASCET criteria, using the distal internal carotid diameter as the denominator.  CONTRAST:  8m OMNIPAQUE IOHEXOL 350 MG/ML SOLN   COMPARISON:  Noncontrast CT head earlier today.  FINDINGS: CTA HEAD FINDINGS  Moderate cerebral and cerebellar atrophy. No cortical hypodensity. No abnormal postcontrast enhancement. Biparietal thinning. Major dural venous sinuses patent. Postop changes in the right globe.  50% cavernous ICA stenosis on the right. Calcific non stenotic atheromatous change in the cavernous region on the left. Widely patent supraclinoid ICAs bilaterally. Widely patent basilar artery with right vertebral dominant. Left vertebral ends in PICA. No intracranial stenosis or occlusion is evident.  Review of the MIP images confirms the above findings.  CTA NECK FINDINGS  Transverse arch heavily calcified with some mural thrombus proximally. Conventional branching of great vessels from the arch. Moderate atheromatous change at the right and left subclavian origin. Right vertebral dominant with left vertebral hypoplastic.  Heavily calcified plaque at the right carotid bifurcation extending into the proximal right internal carotid artery. Ratio of 2.9/ 4.6 proximal/distal is consistent with a less than 50% stenosis. No ulceration or soft plaque. No evidence for dissection.  Extensive heavily calcified left carotid bifurcation with severe luminal narrowing. Ratio of 0.8/5.2 proximal/distal consistent with a 80-90% stenosis. No ulceration or soft plaque. No evidence for dissection.  The patient is status post C3- C6 posterior fusion with laminectomy. Moderate anterolisthesis T1 on T2 is degenerative in nature. There is healing of the previously identified C5 fracture with restoration of normal alignment. Instrumentation grossly intact. There is fusion at least across the  C3-4 interspace. There is nonunion of a type 2 odontoid fracture with mild canal stenosis on the right but no apparent vertebral artery compromise. No lung apex lesion. No neck masses. No osseous destructive lesion although skeletal osteopenia is noted.  Review of the MIP images  confirms the above findings.  IMPRESSION: 80-90% heavily calcified left internal carotid artery stenosis. This is potentially flow reducing.  No intracranial stenosis, occlusion, or visible acute infarct.  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 No apparent vertebral artery compromise.  Previous C3 through C6 fusion with healing of C5 fracture.  Vascular Findings reviewed with ordering provider.   Electronically Signed   By: Rolla Flatten M.D.   On: 09/30/2013 12:47   Ct Head Wo Contrast  09/30/2013   CLINICAL DATA:  Code stroke.  EXAM: CT HEAD WITHOUT CONTRAST  TECHNIQUE: Contiguous axial images were obtained from the base of the skull through the vertex without intravenous contrast.  COMPARISON:  12/25/2012  FINDINGS: There is no evidence of intra-axial nor extra-axial fluid collections, no evidence of acute hemorrhage. Diffuse cortical atrophy is identified. Mild areas of low attenuation project within the subcortical, deep and periventricular white matter regions. Ventricles and cisterns are patent. There is no evidence of subfalcine or tonsillar herniation. Iatrogenic changes are appreciated within the right globe consistent with recent surgical intervention. There is mild mucosal thickening within the ethmoid air cells. Remaining visualized paranasal sinuses and mastoid air cells are patent. There is no evidence of a depressed skull fracture. The bones are osteopenic.  IMPRESSION: Involutional and chronic changes without evidence of acute intracranial abnormalities. Iatrogenic changes are appreciated within the right globe. These results were called by telephone at the time of interpretation on 09/30/2013 at 11:35 AM to Dr. Nicole Kindred, who verbally acknowledged these results.   Electronically Signed   By: Margaree Mackintosh M.D.   On: 09/30/2013 11:36   Ct Angio Neck W/cm &/or Wo/cm  09/30/2013   CLINICAL DATA:  Right eye surgery 1 day ago for retinal disc abdomen. Developed  garbled speech and possible syncopal episode  EXAM: CT ANGIOGRAPHY HEAD AND NECK  TECHNIQUE: Multidetector CT imaging of the head and neck was performed using the standard protocol during bolus administration of intravenous contrast. Multiplanar CT image reconstructions including MIPs were obtained to evaluate the vascular anatomy. Carotid stenosis measurements (when applicable) are obtained utilizing NASCET criteria, using the distal internal carotid diameter as the denominator.  CONTRAST:  8m OMNIPAQUE IOHEXOL 350 MG/ML SOLN  COMPARISON:  Noncontrast CT head earlier today.  FINDINGS: CTA HEAD FINDINGS  Moderate cerebral and cerebellar atrophy. No cortical hypodensity. No abnormal postcontrast enhancement. Biparietal thinning. Major dural venous sinuses patent. Postop changes in the right globe.  50% cavernous ICA stenosis on the right. Calcific non stenotic atheromatous change in the cavernous region on the left. Widely patent supraclinoid ICAs bilaterally. Widely patent basilar artery with right vertebral dominant. Left vertebral ends in PICA. No intracranial stenosis or occlusion is evident.  Review of the MIP images confirms the above findings.  CTA NECK FINDINGS  Transverse arch heavily calcified with some mural thrombus proximally. Conventional branching of great vessels from the arch. Moderate atheromatous change at the right and left subclavian origin. Right vertebral dominant with left vertebral hypoplastic.  Heavily calcified plaque at the right carotid bifurcation extending into the proximal right internal carotid artery. Ratio of 2.9/ 4.6 proximal/distal is consistent with a less than 50% stenosis. No ulceration or soft plaque. No  evidence for dissection.  Extensive heavily calcified left carotid bifurcation with severe luminal narrowing. Ratio of 0.8/5.2 proximal/distal consistent with a 80-90% stenosis. No ulceration or soft plaque. No evidence for dissection.  The patient is status post C3- C6  posterior fusion with laminectomy. Moderate anterolisthesis T1 on T2 is degenerative in nature. There is healing of the previously identified C5 fracture with restoration of normal alignment. Instrumentation grossly intact. There is fusion at least across the C3-4 interspace. There is nonunion of a type 2 odontoid fracture with mild canal stenosis on the right but no apparent vertebral artery compromise. No lung apex lesion. No neck masses. No osseous destructive lesion although skeletal osteopenia is noted.  Review of the MIP images confirms the above findings.  IMPRESSION: 80-90% heavily calcified left internal carotid artery stenosis. This is potentially flow reducing.  No intracranial stenosis, occlusion, or visible acute infarct.  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 No apparent vertebral artery compromise.  Previous C3 through C6 fusion with healing of C5 fracture.  Vascular Findings reviewed with ordering provider.   Electronically Signed   By: Rolla Flatten M.D.   On: 09/30/2013 12:47    Etta Quill PA-C [facility] [phone]  09/30/2013, 1:19 PM   Patient seen and examined.  Clinical course and management discussed.  Necessary edits performed.  I agree with the above.  Assessment and plan of care developed and discussed below.    Assessment: 80 y.o. female presenting with both an expressive and receptive aphasia.  Although improved somewhat since initial presentation patient still with significant language deficits.  Head CT reviewed and shows no acute infarct.  With recent surgery at a noncompressible site patient felt to not be a tPA candidate.  Patient also with a high mRankin scale as well.  Further work up and management recommended.    Stroke Risk Factors - hypertension and CAD  Recommendations: 1.  CTA of head and neck to evaluate for possible appropriateness for interventional procedures.  Alexis Goodell, MD [facility] [phone]  09/30/2013  2:33 PM  Addendum: CTA of the head and neck reviewed with radiology.  Although there is significant atherosclerosis noted in the left ICA, much of it is calcified.  No distinct thrombus identified. Again mRankin 3 and patient not felt to be an appropriate candidate for IR  Recommendations: 1.  Admit for stroke work up 2. HgbA1c, fasting lipid panel 2. MRI of the brain without contrast 3. PT consult, OT consult, Speech consult 4. Echocardiogram 5. Carotid dopplers 6. Prophylactic therapy-Antiplatelet med: Plavix - dose 75 mg daily 7. Risk factor modification 8. Telemetry monitoring 9. Frequent neuro checks  Alexis Goodell, MD [facility] [phone]

## 2013-09-30 NOTE — ED Notes (Signed)
Daughter states she has pre-existing paralysis on the left side d/t spinal stenosis.

## 2013-09-30 NOTE — ED Provider Notes (Addendum)
TIME SEEN: 11:05 AM  CHIEF COMPLAINT: Code stroke  HPI: Patient is a 80 y.o. female with a history of CHF, CAD, hypertension, hyperlipidemia who presents the emergency department with confusion and dysarthria that started at 10:30 AM while at her ophthalmologist office. I have spoken to Dr. Stephannie Li who reports that approximately 10:30 to 1045 this morning patient began having garbled speech was very confused and difficult to understand. No focal weakness or numbness. No history of head injury. She did have a right-sided vitrectomy yesterday for retinal detachment. She is not on anticoagulation. No prior history of stroke or TIA. Patient had normal glucose with EMS and was hemodynamically stable.  ROS: See HPI Constitutional: no fever  Eyes: no drainage  ENT: no runny nose   Cardiovascular:  no chest pain  Resp: no SOB  GI: no vomiting GU: no dysuria Integumentary: no rash  Allergy: no hives  Musculoskeletal: no leg swelling  Neurological:  slurred speech ROS otherwise negative  PAST MEDICAL HISTORY/PAST SURGICAL HISTORY:  Past Medical History  Diagnosis Date  . CHF (congestive heart failure)   . Coronary artery disease   . Hypertension   . Arthritis   . Edema of extremities   . Anxiety   . HLD (hyperlipidemia)     MEDICATIONS:  Prior to Admission medications   Medication Sig Start Date End Date Taking? Authorizing Provider  acetaminophen (TYLENOL) 325 MG tablet Take 650 mg by mouth every 4 (four) hours as needed for pain.    Historical Provider, MD  ALPRAZolam Prudy Feeler) 0.25 MG tablet Take 1 tablet every night at bedtime for anxiety 01/21/13   Lucrezia Starch, NP  aspirin EC 81 MG tablet Take 81 mg by mouth daily.    Historical Provider, MD  atorvastatin (LIPITOR) 10 MG tablet Take 10 mg by mouth daily.    Historical Provider, MD  ferrous sulfate 325 (65 FE) MG tablet Take 325 mg by mouth 2 (two) times daily.    Historical Provider, MD  furosemide (LASIX) 20 MG tablet Take 1  tablet (20 mg total) by mouth daily. 01/02/13   Meredeth Ide, MD  HYDROcodone-acetaminophen (NORCO) 10-325 MG per tablet Take 1 tablets every 6 hours as needed for mild pain(1-4) Take 2 tablets every 6 hours as needed for moderate to severe pain(5-10). 03/21/13   Tiffany L Reed, DO  lisinopril (PRINIVIL,ZESTRIL) 20 MG tablet Take 1 tablet (20 mg total) by mouth daily. 01/02/13   Meredeth Ide, MD  loratadine (CLARITIN) 10 MG tablet Take 10 mg by mouth as needed for allergies.    Historical Provider, MD  methocarbamol (ROBAXIN) 500 MG tablet Take 1 tablet (500 mg total) by mouth every 6 (six) hours as needed. 01/02/13   Meredeth Ide, MD  metoprolol (LOPRESSOR) 50 MG tablet Take 50 mg by mouth 2 (two) times daily.    Historical Provider, MD  MOVIPREP 100 G SOLR Use per prep instructions 03/14/13   Iva Boop, MD  Multiple Vitamin (MULTIVITAMIN WITH MINERALS) TABS Take 1 tablet by mouth daily.    Historical Provider, MD  polyethylene glycol powder (GLYCOLAX/MIRALAX) powder Take 17 g by mouth daily.    Historical Provider, MD  pramipexole (MIRAPEX) 0.125 MG tablet Take 0.25 mg by mouth at bedtime.    Historical Provider, MD  promethazine (PHENERGAN) 25 MG tablet Take 25 mg by mouth every 8 (eight) hours as needed for nausea.    Historical Provider, MD  Protein (PROCEL) POWD Take 1 scoop by mouth  3 (three) times daily.    Historical Provider, MD  senna-docusate (SENOKOT-S) 8.6-50 MG per tablet Take 1 tablet by mouth 2 (two) times daily. 01/02/13   Meredeth IdeGagan S Lama, MD    ALLERGIES:  Allergies  Allergen Reactions  . Penicillins     SOCIAL HISTORY:  History  Substance Use Topics  . Smoking status: Never Smoker   . Smokeless tobacco: Never Used  . Alcohol Use: Yes     Comment: rarely    FAMILY HISTORY: Family History  Problem Relation Age of Onset  . Dementia Sister     x 3  . CAD Brother   . Breast cancer Sister     x 3  . Heart disease Sister   . Heart disease Son     x 2  . Alzheimer's  disease Sister     EXAM: BP 172/87  Pulse 73  Temp(Src) 97.4 F (36.3 C) (Oral)  Resp 13  SpO2 99% CONSTITUTIONAL: Alert and oriented and responds appropriately to questions. Well-appearing; well-nourished HEAD: Normocephalic EYES: Conjunctivae clear, PERRL ENT: normal nose; no rhinorrhea; moist mucous membranes; pharynx without lesions noted NECK: Supple, no meningismus, no LAD  CARD: RRR; S1 and S2 appreciated; no murmurs, no clicks, no rubs, no gallops RESP: Normal chest excursion without splinting or tachypnea; breath sounds clear and equal bilaterally; no wheezes, no rhonchi, no rales,  ABD/GI: Normal bowel sounds; non-distended; soft, non-tender, no rebound, no guarding BACK:  The back appears normal and is non-tender to palpation, there is no CVA tenderness EXT: Normal ROM in all joints; non-tender to palpation; no edema; normal capillary refill; no cyanosis    SKIN: Normal color for age and race; warm NEURO: Moves all extremities equally, cranial nerves II through XII intact, patient will in her memory follow commands, patient has some dysarthria and receptive aphasia, NIH stroke scale 2 PSYCH: The patient's mood and manner are appropriate. Grooming and personal hygiene are appropriate.  MEDICAL DECISION MAKING: Stroke called. Patient is hemodynamically stable, protecting airway. Labs, urine, head CT pending. Neurology at bedside.  ED PROGRESS: Patient having continued symptoms. CT head shows no intracranial abnormalities.  Troponin negative. Labs show mild leukocytosis which I feel is likely reactive. Neurology has seen and Dr. Thad Rangereynolds would like CT angio head and neck.   12:31 PM  Neurology feels patient stroke scale is too low and given her recent eye surgery they do not recommend TPA. Will admit to medicine. PCP is Dr. Valentina LucksGriffin.   EKG Interpretation    Date/Time:  Friday September 30 2013 11:21:20 EST Ventricular Rate:  86 PR Interval:  191 QRS Duration: 85 QT  Interval:  365 QTC Calculation: 436 R Axis:   43 Text Interpretation:  Sinus rhythm Consider left ventricular hypertrophy Confirmed by Tomika Eckles  DO, Aneya Daddona ([id number] on 09/30/2013 2:36:08 PM             Layla MawKristen N Quynn Vilchis, DO 09/30/13 [id number]  Quamesha Mullet N Darrel Baroni, DO 09/30/13 1436

## 2013-09-30 NOTE — ED Notes (Signed)
Transported to CT 

## 2013-10-01 ENCOUNTER — Inpatient Hospital Stay (HOSPITAL_COMMUNITY): Payer: Medicare Other

## 2013-10-01 ENCOUNTER — Encounter (HOSPITAL_COMMUNITY): Payer: Self-pay | Admitting: *Deleted

## 2013-10-01 DIAGNOSIS — N390 Urinary tract infection, site not specified: Secondary | ICD-10-CM | POA: Diagnosis present

## 2013-10-01 DIAGNOSIS — I6529 Occlusion and stenosis of unspecified carotid artery: Secondary | ICD-10-CM

## 2013-10-01 DIAGNOSIS — I63239 Cerebral infarction due to unspecified occlusion or stenosis of unspecified carotid arteries: Principal | ICD-10-CM | POA: Diagnosis present

## 2013-10-01 DIAGNOSIS — I059 Rheumatic mitral valve disease, unspecified: Secondary | ICD-10-CM

## 2013-10-01 DIAGNOSIS — I509 Heart failure, unspecified: Secondary | ICD-10-CM

## 2013-10-01 LAB — LIPID PANEL
CHOL/HDL RATIO: 2.1 ratio
CHOLESTEROL: 134 mg/dL (ref 0–200)
HDL: 65 mg/dL (ref >39)
LDL CALC: 52 mg/dL (ref 0–99)
TRIGLYCERIDES: 87 mg/dL (ref <150)
VLDL: 17 mg/dL (ref 0–40)

## 2013-10-01 LAB — HEMOGLOBIN A1C
Hgb A1c MFr Bld: 5.7 % — ABNORMAL HIGH (ref <5.7)
Mean Plasma Glucose: 117 mg/dL — ABNORMAL HIGH (ref <117)

## 2013-10-01 MED ORDER — FUROSEMIDE 40 MG PO TABS
40.0000 mg | ORAL_TABLET | Freq: Once | ORAL | Status: AC
  Administered 2013-10-01: 40 mg via ORAL
  Filled 2013-10-01 (×2): qty 1

## 2013-10-01 MED ORDER — CIPROFLOXACIN IN D5W 400 MG/200ML IV SOLN
400.0000 mg | Freq: Two times a day (BID) | INTRAVENOUS | Status: DC
  Administered 2013-10-01 – 2013-10-02 (×3): 400 mg via INTRAVENOUS
  Filled 2013-10-01 (×4): qty 200

## 2013-10-01 NOTE — Progress Notes (Signed)
[facility] PROGRESS NOTE  [name] ZOX:[id number] DOB: 1931/03/30 DOA: 09/30/2013 PCP: [name], MD  Assessment/Plan: #1 acute CVA The MRI of the head with acute left MCA infarct. CT angio of head and neck with 80-90% LICA stenosis. Continue ASA for secondary stroke prevention. 2-D echo is pending. Vascular surgery has been consulted per neurology for evaluation of left internal carotid artery stenosis. Neurology is following and appreciate input and recommendations.  #2 left ICA stenosis Vascular surgery consultation pending.  #3 UTI Urine cultures pending. Place on IV Cipro.  #4 status post vitrectomy 09/29/2013 Patient has been seen by ophthalmology and recommend continue all eye drops as ordered and followup one week post discharge as outpatient.  #5 history of CHF Stable. Continue metoprolol. We'll give a dose of Lasix 40 by mouth x1.  #6 hypertension Stable. Metoprolol.  #7 anxiety Continue anxiolytics as needed.  #8 hyperlipidemia Continue statin.   Code Status: full Family Communication: updated patient and daughters at bedside. Disposition Plan: home versus SNF when medically stable.   Consultants:  Neurology:Dr. Thad Ranger 09/30/2013  Ophthalmology: Dr. Allyne Gee 10/01/2013  Procedures:  MRI/MRA head 10/01/2013  Chest x-ray 10/01/2013  CT angiogram of head and neck 09/30/2013  Antibiotics:  none  HPI/Subjective: Patient states she's feeling better. Expressive aphasia improving. Confusion improved.  Objective: Filed Vitals:   10/01/13 1751  BP: 140/50  Pulse: 58  Temp: 97.6 F (36.4 C)  Resp: 16    Intake/Output Summary (Last 24 hours) at 10/01/13 1842 Last data filed at 10/01/13 1750  Gross per 24 hour  Intake    120 ml  Output      0 ml  Net    120 ml   Filed Weights   09/30/13 1535  Weight: 77.3 kg (170 lb 6.7 oz)    Exam:   General:  NAD  Cardiovascular: RRR  Respiratory: CTAB  Abdomen: soft,  nontender, nondistended, positive bowel sounds.  Musculoskeletal: no clubbing cyanosis or edema  Data Reviewed: Basic Metabolic Panel:  Recent Labs Lab 09/30/13 1102 09/30/13 1111  NA 139 140  K 4.3 4.2  CL 101 101  CO2 27  --   GLUCOSE 98 99  BUN 29* 29*  CREATININE 0.82 1.00  CALCIUM 9.6  --    Liver Function Tests:  Recent Labs Lab 09/30/13 1102  AST 32  ALT 26  ALKPHOS 137*  BILITOT 0.4  PROT 7.8  ALBUMIN 3.5   No results found for this basename: LIPASE, AMYLASE,  in the last 168 hours No results found for this basename: AMMONIA,  in the last 168 hours CBC:  Recent Labs Lab 09/30/13 1102 09/30/13 1111  WBC 11.7*  --   NEUTROABS 8.3*  --   HGB 10.6* 11.9*  HCT 32.7* 35.0*  MCV 90.8  --   PLT 299  --    Cardiac Enzymes:  Recent Labs Lab 09/30/13 1102  TROPONINI <0.30   BNP (last 3 results) No results found for this basename: PROBNP,  in the last 8760 hours CBG:  Recent Labs Lab 09/30/13 1203  GLUCAP 93    Recent Results (from the past 240 hour(s))  URINE CULTURE     Status: None   Collection Time    09/30/13 12:40 PM      Result Value Range Status   Specimen Description URINE, CLEAN CATCH   Final   Special Requests NONE   Final   Culture  Setup Time     Final   Value:  09/30/2013 17:09     Performed at [facility] Count     Final   Value: 50,000 COLONIES/ML     Performed at [facility]   Culture     Final   Value: PROTEUS MIRABILIS     Performed at [facility]   Report Status PENDING   Incomplete     Studies: Ct Angio Head W/cm &/or Wo Cm  09/30/2013   CLINICAL DATA:  Right eye surgery 1 day ago for retinal disc abdomen. Developed garbled speech and possible syncopal episode  EXAM: CT ANGIOGRAPHY HEAD AND NECK  TECHNIQUE: Multidetector CT imaging of the head and neck was performed using the standard protocol during bolus administration of intravenous contrast. Multiplanar CT image reconstructions  including MIPs were obtained to evaluate the vascular anatomy. Carotid stenosis measurements (when applicable) are obtained utilizing NASCET criteria, using the distal internal carotid diameter as the denominator.  CONTRAST:  50mL OMNIPAQUE IOHEXOL 350 MG/ML SOLN  COMPARISON:  Noncontrast CT head earlier today.  FINDINGS: CTA HEAD FINDINGS  Moderate cerebral and cerebellar atrophy. No cortical hypodensity. No abnormal postcontrast enhancement. Biparietal thinning. Major dural venous sinuses patent. Postop changes in the right globe.  50% cavernous ICA stenosis on the right. Calcific non stenotic atheromatous change in the cavernous region on the left. Widely patent supraclinoid ICAs bilaterally. Widely patent basilar artery with right vertebral dominant. Left vertebral ends in PICA. No intracranial stenosis or occlusion is evident.  Review of the MIP images confirms the above findings.  CTA NECK FINDINGS  Transverse arch heavily calcified with some mural thrombus proximally. Conventional branching of great vessels from the arch. Moderate atheromatous change at the right and left subclavian origin. Right vertebral dominant with left vertebral hypoplastic.  Heavily calcified plaque at the right carotid bifurcation extending into the proximal right internal carotid artery. Ratio of 2.9/ 4.6 proximal/distal is consistent with a less than 50% stenosis. No ulceration or soft plaque. No evidence for dissection.  Extensive heavily calcified left carotid bifurcation with severe luminal narrowing. Ratio of 0.8/5.2 proximal/distal consistent with a 80-90% stenosis. No ulceration or soft plaque. No evidence for dissection.  The patient is status post C3- C6 posterior fusion with laminectomy. Moderate anterolisthesis T1 on T2 is degenerative in nature. There is healing of the previously identified C5 fracture with restoration of normal alignment. Instrumentation grossly intact. There is fusion at least across the C3-4  interspace. There is nonunion of a type 2 odontoid fracture with mild canal stenosis on the right but no apparent vertebral artery compromise. No lung apex lesion. No neck masses. No osseous destructive lesion although skeletal osteopenia is noted.  Review of the MIP images confirms the above findings.  IMPRESSION: 80-90% heavily calcified left internal carotid artery stenosis. This is potentially flow reducing.  No intracranial stenosis, occlusion, or visible acute infarct.  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 No apparent vertebral artery compromise.  Previous C3 through C6 fusion with healing of C5 fracture.  Vascular Findings reviewed with ordering provider.   Electronically Signed   By: Davonna Belling M.D.   On: 09/30/2013 12:47   Dg Chest 2 View  10/01/2013   CLINICAL DATA:  Stroke symptoms  EXAM: CHEST  2 VIEW  COMPARISON:  PA and lateral chest x-ray dated March 19, 2008.  FINDINGS: The lungs are mildly hypoinflated. There are increased lung markings at both lung bases consistent with atelectasis or pneumonia. The hemidiaphragms  remain visible but there is blunting of the costophrenic angles consistent with small bilateral pleural effusions. The cardiopericardial silhouette is enlarged. The central pulmonary vascularity is engorged. There is chronic deformity of the midshaft of the right clavicle.  IMPRESSION: 1. New bibasilar increased density is consistent with atelectasis or pneumonia. There are small bilateral pleural effusions. 2. There is enlargement of the cardiac silhouette with prominence of the central pulmonary vascularity consistent with low-grade CHF.   Electronically Signed   By: David  [place]   On: 10/01/2013 09:20   Ct Head Wo Contrast  09/30/2013   CLINICAL DATA:  Code stroke.  EXAM: CT HEAD WITHOUT CONTRAST  TECHNIQUE: Contiguous axial images were obtained from the base of the skull through the vertex without intravenous contrast.  COMPARISON:   12/25/2012  FINDINGS: There is no evidence of intra-axial nor extra-axial fluid collections, no evidence of acute hemorrhage. Diffuse cortical atrophy is identified. Mild areas of low attenuation project within the subcortical, deep and periventricular white matter regions. Ventricles and cisterns are patent. There is no evidence of subfalcine or tonsillar herniation. Iatrogenic changes are appreciated within the right globe consistent with recent surgical intervention. There is mild mucosal thickening within the ethmoid air cells. Remaining visualized paranasal sinuses and mastoid air cells are patent. There is no evidence of a depressed skull fracture. The bones are osteopenic.  IMPRESSION: Involutional and chronic changes without evidence of acute intracranial abnormalities. Iatrogenic changes are appreciated within the right globe. These results were called by telephone at the time of interpretation on 09/30/2013 at 11:35 AM to Dr. Roseanne Reno, who verbally acknowledged these results.   Electronically Signed   By: Salome Holmes M.D.   On: 09/30/2013 11:36   Ct Angio Neck W/cm &/or Wo/cm  09/30/2013   CLINICAL DATA:  Right eye surgery 1 day ago for retinal disc abdomen. Developed garbled speech and possible syncopal episode  EXAM: CT ANGIOGRAPHY HEAD AND NECK  TECHNIQUE: Multidetector CT imaging of the head and neck was performed using the standard protocol during bolus administration of intravenous contrast. Multiplanar CT image reconstructions including MIPs were obtained to evaluate the vascular anatomy. Carotid stenosis measurements (when applicable) are obtained utilizing NASCET criteria, using the distal internal carotid diameter as the denominator.  CONTRAST:  50mL OMNIPAQUE IOHEXOL 350 MG/ML SOLN  COMPARISON:  Noncontrast CT head earlier today.  FINDINGS: CTA HEAD FINDINGS  Moderate cerebral and cerebellar atrophy. No cortical hypodensity. No abnormal postcontrast enhancement. Biparietal thinning. Major  dural venous sinuses patent. Postop changes in the right globe.  50% cavernous ICA stenosis on the right. Calcific non stenotic atheromatous change in the cavernous region on the left. Widely patent supraclinoid ICAs bilaterally. Widely patent basilar artery with right vertebral dominant. Left vertebral ends in PICA. No intracranial stenosis or occlusion is evident.  Review of the MIP images confirms the above findings.  CTA NECK FINDINGS  Transverse arch heavily calcified with some mural thrombus proximally. Conventional branching of great vessels from the arch. Moderate atheromatous change at the right and left subclavian origin. Right vertebral dominant with left vertebral hypoplastic.  Heavily calcified plaque at the right carotid bifurcation extending into the proximal right internal carotid artery. Ratio of 2.9/ 4.6 proximal/distal is consistent with a less than 50% stenosis. No ulceration or soft plaque. No evidence for dissection.  Extensive heavily calcified left carotid bifurcation with severe luminal narrowing. Ratio of 0.8/5.2 proximal/distal consistent with a 80-90% stenosis. No ulceration or soft plaque. No evidence for dissection.  The patient  is status post C3- C6 posterior fusion with laminectomy. Moderate anterolisthesis T1 on T2 is degenerative in nature. There is healing of the previously identified C5 fracture with restoration of normal alignment. Instrumentation grossly intact. There is fusion at least across the C3-4 interspace. There is nonunion of a type 2 odontoid fracture with mild canal stenosis on the right but no apparent vertebral artery compromise. No lung apex lesion. No neck masses. No osseous destructive lesion although skeletal osteopenia is noted.  Review of the MIP images confirms the above findings.  IMPRESSION: 80-90% heavily calcified left internal carotid artery stenosis. This is potentially flow reducing.  No intracranial stenosis, occlusion, or visible acute infarct.   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 No apparent vertebral artery compromise.  Previous C3 through C6 fusion with healing of C5 fracture.  Vascular Findings reviewed with ordering provider.   Electronically Signed   By: Davonna BellingJohn  Curnes M.D.   On: 09/30/2013 12:47   Mr Brain Wo Contrast  10/01/2013   CLINICAL DATA:  Acute onset mental status changes and difficulty speaking.  EXAM: MRI HEAD WITHOUT CONTRAST  MRA HEAD WITHOUT CONTRAST  TECHNIQUE: Multiplanar, multiecho pulse sequences of the brain and surrounding structures were obtained without intravenous contrast. Angiographic images of the head were obtained using MRA technique without contrast.  COMPARISON:  Head CT and CTA 09/30/2013  FINDINGS: MRI HEAD FINDINGS  Limited visualization of the upper cervical spine demonstrate nonunited dens fracture, better seen on recent CT. Incidental note is made of a partially empty sella. Restricted diffusion is present in cortical and subcortical locations in the left temporoparietal region as well as posterior left insula, consistent with acute infarcts. There is mild edema within these regions of infarct without significant mass effect. There is also a punctate focus of restricted diffusion in left frontal lobe cortex more superiorly. There is no evidence of intracranial hemorrhage. There is mild-to-moderate generalized cerebral atrophy. Scattered, small foci of T2 hyperintensity within the subcortical and deep cerebral white matter bilaterally as well as within the pons are nonspecific but compatible with mild to moderate chronic small vessel ischemic disease. There is no midline shift or extra-axial fluid collection. Major intracranial vascular flow voids are unremarkable. Abnormal signal within the right globe is likely related to recent surgery. The left maxillary sinus is small.  MRA HEAD FINDINGS  The visualized distal vertebral arteries are patent. The right vertebral artery is  dominant the vertebral artery ending in PICA. Right PICA is patent. Basilar artery is patent. SCAs are patent. There is a fetal origin of the left PCA. PCAs are otherwise unremarkable. Internal carotid arteries are patent from skullbase to carotid terminus. Mild right greater than left cavernous carotid atherosclerosis is again seen, better demonstrated on recent CTA. ACA and MCA origins and visualized branches are patent and unremarkable. There is a patent anterior communicating artery. No intracranial aneurysm is identified.  IMPRESSION: 1. Acute left MCA infarct predominantly involving the temporoparietal region. 2. No evidence of major intracranial arterial occlusion or flow-limiting stenosis. These results will be called to the ordering clinician or representative by the Radiologist Assistant, and communication documented in the PACS Dashboard.   Electronically Signed   By: Sebastian AcheAllen  Grady   On: 10/01/2013 09:22   Mr Maxine GlennMra Head/brain Wo Cm  10/01/2013   CLINICAL DATA:  Acute onset mental status changes and difficulty speaking.  EXAM: MRI HEAD WITHOUT CONTRAST  MRA HEAD WITHOUT CONTRAST  TECHNIQUE: Multiplanar, multiecho pulse sequences  of the brain and surrounding structures were obtained without intravenous contrast. Angiographic images of the head were obtained using MRA technique without contrast.  COMPARISON:  Head CT and CTA 09/30/2013  FINDINGS: MRI HEAD FINDINGS  Limited visualization of the upper cervical spine demonstrate nonunited dens fracture, better seen on recent CT. Incidental note is made of a partially empty sella. Restricted diffusion is present in cortical and subcortical locations in the left temporoparietal region as well as posterior left insula, consistent with acute infarcts. There is mild edema within these regions of infarct without significant mass effect. There is also a punctate focus of restricted diffusion in left frontal lobe cortex more superiorly. There is no evidence of  intracranial hemorrhage. There is mild-to-moderate generalized cerebral atrophy. Scattered, small foci of T2 hyperintensity within the subcortical and deep cerebral white matter bilaterally as well as within the pons are nonspecific but compatible with mild to moderate chronic small vessel ischemic disease. There is no midline shift or extra-axial fluid collection. Major intracranial vascular flow voids are unremarkable. Abnormal signal within the right globe is likely related to recent surgery. The left maxillary sinus is small.  MRA HEAD FINDINGS  The visualized distal vertebral arteries are patent. The right vertebral artery is dominant the vertebral artery ending in PICA. Right PICA is patent. Basilar artery is patent. SCAs are patent. There is a fetal origin of the left PCA. PCAs are otherwise unremarkable. Internal carotid arteries are patent from skullbase to carotid terminus. Mild right greater than left cavernous carotid atherosclerosis is again seen, better demonstrated on recent CTA. ACA and MCA origins and visualized branches are patent and unremarkable. There is a patent anterior communicating artery. No intracranial aneurysm is identified.  IMPRESSION: 1. Acute left MCA infarct predominantly involving the temporoparietal region. 2. No evidence of major intracranial arterial occlusion or flow-limiting stenosis. These results will be called to the ordering clinician or representative by the Radiologist Assistant, and communication documented in the PACS Dashboard.   Electronically Signed   By: Sebastian Ache   On: 10/01/2013 09:22    Scheduled Meds: . aspirin  300 mg Rectal Daily   Or  . aspirin  325 mg Oral Daily  . atorvastatin  40 mg Oral q1800  . ciprofloxacin  1 drop Right Eye QID  . ciprofloxacin  400 mg Intravenous Q12H  . dorzolamide-timolol  1 drop Right Eye BID  . enoxaparin (LOVENOX) injection  40 mg Subcutaneous Q24H  . metoprolol  50 mg Oral BID  . pantoprazole  40 mg Oral Daily   . pramipexole  0.25 mg Oral QHS  . prednisoLONE acetate  1 drop Right Eye QID   Continuous Infusions:   Principal Problem:   CVA (cerebral infarction) Active Problems:   Coronary artery disease   Hypertension   Anemia of other chronic disease   Slurred speech   UTI (urinary tract infection)   Occlusion and stenosis of carotid artery with cerebral infarction    Time spent: 35 minutes    Jamarrion Budai M.D. [facility] Pager [phone]. If 7PM-7AM, please contact night-coverage at [email or web address], password [facility] 10/01/2013, 6:42 PM  LOS: 1 day

## 2013-10-01 NOTE — Evaluation (Signed)
Speech Language Pathology Evaluation Patient Details Name: [name] MRN: [id number] DOB: Oct 07, 1930 Today's Date: 10/01/2013 Time: 1130-1155 SLP Time Calculation (min): 25 min  Problem List:  Patient Active Problem List   Diagnosis Date Noted  . UTI (urinary tract infection) 10/01/2013  . CVA (cerebral infarction) 09/30/2013  . Slurred speech 09/30/2013  . Anemia of other chronic disease 03/11/2013  . Osteoporosis 12/25/2012  . Spinal stenosis of lumbar region 12/25/2012  . Chronic back pain 12/25/2012  . Hip pain- LEFT 12/25/2012  . AKI (acute kidney injury) 12/25/2012  . Fall 12/25/2012  . Anemia 12/25/2012  . Hyponatremia 12/25/2012  . Cervical spine fracture 12/25/2012  . RLS (restless legs syndrome) 12/25/2012  . Varicose veins 12/25/2012  . CHF (congestive heart failure)   . Coronary artery disease   . Hypertension   . Arthritis    Past Medical History:  Past Medical History  Diagnosis Date  . CHF (congestive heart failure)   . Coronary artery disease   . Hypertension   . Arthritis   . Edema of extremities   . Anxiety   . HLD (hyperlipidemia)    Past Surgical History:  Past Surgical History  Procedure Laterality Date  . Total knee arthroplasty Bilateral     knee replacements  . Lumbar disc surgery    . Cholecystectomy    . Posterior cervical fusion/foraminotomy N/A 12/28/2012    Procedure: POSTERIOR CERVICAL FUSION/FORAMINOTOMY LEVEL 3;  Surgeon: Barnett Abu, MD;  Location: [facility];  Service: Neurosurgery;  Laterality: N/A;  Posterior Cervical Three-Six Laminectomy and Fusion  . Angioplasty    . Tonsillectomy     HPI:  Pt. is an 80 y.o. female with a PMH: HTN, CAD, dyslipidemia who underwent vitrectomy on 09/29/2013, presented to her ophthalmologist office for a followup visit and developed sudden onset mental status changes at the Ophthalmology office, as initially having a blank stare and noted to be confused and disoriented, unable to follow  commands or speak. There was no loss of consciousness during this time. After this she was noted to have slurred speech and difficulties getting words out. In the emergency room she is able to follow commands however continues to have difficulties with getting words out. Initial CT scan of brain did not show acute intracranial abnormalities.   Assessment / Plan / Recommendation Clinical Impression  Patient presents with a mild-moderate expressive aphasia characterized by word finding errors, difficulty fully expressing thoughts, delays in responses secondary to searching for words at sentence and conversational level. Patient is aware of errors and attempted to self correct 80% of the time, and responded well to semantic and sentence completion cues when "stuck". She exhibited a mild frequency of perseveration, ,mainly with numbers/dates. Patient's family members stated that her language abilities have improved a lot since yesterday (1/16). Expect patient to continue to exhibit improvements with overall expressive language with speech-language pathology intervention ,as well as some natural recovery from stroke recovery.    SLP Assessment  Patient needs continued Speech Lanaguage Pathology Services    Follow Up Recommendations  Home health SLP;Outpatient SLP;24 hour supervision/assistance    Frequency and Duration min 2x/week  2 weeks   Pertinent Vitals/Pain    SLP Goals  SLP Goals Potential to Achieve Goals: Good  SLP Evaluation Prior Functioning  Cognitive/Linguistic Baseline: Within functional limits Type of Home: House  Lives With: Family;Other (Comment) (hired caregiver ) Available Help at Discharge: Available 24 hours/day;Family;Home health   Cognition  Overall Cognitive Status: Within  Functional Limits for tasks assessed Arousal/Alertness: Awake/alert Orientation Level: Oriented X4 Attention: Selective Selective Attention: Appears intact Memory: Appears intact Awareness:  Appears intact Problem Solving: Appears intact Executive Function: Reasoning;Decision Making Reasoning: Appears intact Decision Making: Appears intact Safety/Judgment: Appears intact    Comprehension  Auditory Comprehension Overall Auditory Comprehension: Appears within functional limits for tasks assessed Yes/No Questions: Within Functional Limits Commands: Within Functional Limits Visual Recognition/Discrimination Discrimination: Within Function Limits Reading Comprehension Reading Status: Not tested    Expression Expression Primary Mode of Expression: Verbal Verbal Expression Overall Verbal Expression: Impaired Automatic Speech: Name;Social Response Level of Generative/Spontaneous Verbalization: Word;Phrase;Sentence Naming: Impairment Responsive: 51-75% accurate Confrontation: Impaired Convergent: 50-74% accurate Divergent: Not tested Verbal Errors: Perseveration;Aware of errors Pragmatics: No impairment Effective Techniques: Semantic cues;Sentence completion Non-Verbal Means of Communication: Not applicable Written Expression Dominant Hand: Right Written Expression: Not tested   Oral / Motor Oral Motor/Sensory Function Overall Oral Motor/Sensory Function: Appears within functional limits for tasks assessed Motor Speech Overall Motor Speech: Appears within functional limits for tasks assessed   GO     Pablo Lawrencereston, John Tarrell 10/01/2013, 6:36 PM  Angela NevinJohn T. Preston, MA, CCC-SLP [facility] Speech-Language Pathologist

## 2013-10-01 NOTE — Consult Note (Signed)
Reason for Consult:post op day 2  Referring Physician: ophthalmology   [name] is an 80 y.o. female.  HPI: day 2 s/p ppv /afx/el/ec/ed OD for recurrent traumatic RD. No pain last night. Doing well. Able to see shapes.  Past Medical History  Diagnosis Date  . CHF (congestive heart failure)   . Coronary artery disease   . Hypertension   . Arthritis   . Edema of extremities   . Anxiety   . HLD (hyperlipidemia)     Past Surgical History  Procedure Laterality Date  . Total knee arthroplasty Bilateral     knee replacements  . Lumbar disc surgery    . Cholecystectomy    . Posterior cervical fusion/foraminotomy N/A 12/28/2012    Procedure: POSTERIOR CERVICAL FUSION/FORAMINOTOMY LEVEL 3;  Surgeon: Kristeen Miss, MD;  Location: [facility];  Service: Neurosurgery;  Laterality: N/A;  Posterior Cervical Three-Six Laminectomy and Fusion  . Angioplasty    . Tonsillectomy      Family History  Problem Relation Age of Onset  . Dementia Sister     x 3  . CAD Brother   . Breast cancer Sister     x 3  . Heart disease Sister   . Heart disease Son     x 2  . Alzheimer's disease Sister     Social History:  reports that she has never smoked. She has never used smokeless tobacco. She reports that she drinks alcohol. She reports that she does not use illicit drugs.  Allergies:  Allergies  Allergen Reactions  . Penicillins Shortness Of Breath    Throat closed up and had hives and nausea    Medications: I have reviewed the patient's current medications.  Results for orders placed during the hospital encounter of 09/30/13 (from the past 48 hour(s))  ETHANOL     Status: None   Collection Time    09/30/13 11:02 AM      Result Value Range   Alcohol, Ethyl (B) <11  0 - 11 mg/dL   Comment:            LOWEST DETECTABLE LIMIT FOR     SERUM ALCOHOL IS 11 mg/dL     FOR MEDICAL PURPOSES ONLY  PROTIME-INR     Status: None   Collection Time    09/30/13 11:02 AM      Result Value  Range   Prothrombin Time 13.2  11.6 - 15.2 seconds   INR 1.02  0.00 - 1.49  APTT     Status: None   Collection Time    09/30/13 11:02 AM      Result Value Range   aPTT 29  24 - 37 seconds  CBC     Status: Abnormal   Collection Time    09/30/13 11:02 AM      Result Value Range   WBC 11.7 (*) 4.0 - 10.5 K/uL   RBC 3.60 (*) 3.87 - 5.11 MIL/uL   Hemoglobin 10.6 (*) 12.0 - 15.0 g/dL   HCT 32.7 (*) 36.0 - 46.0 %   MCV 90.8  78.0 - 100.0 fL   MCH 29.4  26.0 - 34.0 pg   MCHC 32.4  30.0 - 36.0 g/dL   RDW 15.7 (*) 11.5 - 15.5 %   Platelets 299  150 - 400 K/uL  DIFFERENTIAL     Status: Abnormal   Collection Time    09/30/13 11:02 AM      Result Value Range   Neutrophils  Relative % 70  43 - 77 %   Neutro Abs 8.3 (*) 1.7 - 7.7 K/uL   Lymphocytes Relative 22  12 - 46 %   Lymphs Abs 2.6  0.7 - 4.0 K/uL   Monocytes Relative 6  3 - 12 %   Monocytes Absolute 0.7  0.1 - 1.0 K/uL   Eosinophils Relative 1  0 - 5 %   Eosinophils Absolute 0.1  0.0 - 0.7 K/uL   Basophils Relative 0  0 - 1 %   Basophils Absolute 0.1  0.0 - 0.1 K/uL  COMPREHENSIVE METABOLIC PANEL     Status: Abnormal   Collection Time    09/30/13 11:02 AM      Result Value Range   Sodium 139  137 - 147 mEq/L   Potassium 4.3  3.7 - 5.3 mEq/L   Chloride 101  96 - 112 mEq/L   CO2 27  19 - 32 mEq/L   Glucose, Bld 98  70 - 99 mg/dL   BUN 29 (*) 6 - 23 mg/dL   Creatinine, Ser 0.82  0.50 - 1.10 mg/dL   Calcium 9.6  8.4 - 10.5 mg/dL   Total Protein 7.8  6.0 - 8.3 g/dL   Albumin 3.5  3.5 - 5.2 g/dL   AST 32  0 - 37 U/L   ALT 26  0 - 35 U/L   Alkaline Phosphatase 137 (*) 39 - 117 U/L   Total Bilirubin 0.4  0.3 - 1.2 mg/dL   GFR calc non Af Amer 66 (*) >90 mL/min   GFR calc Af Amer 76 (*) >90 mL/min   Comment: (NOTE)     The eGFR has been calculated using the CKD EPI equation.     This calculation has not been validated in all clinical situations.     eGFR's persistently <90 mL/min signify possible Chronic Kidney     Disease.    TROPONIN I     Status: None   Collection Time    09/30/13 11:02 AM      Result Value Range   Troponin I <0.30  <0.30 ng/mL   Comment:            Due to the release kinetics of cTnI,     a negative result within the first hours     of the onset of symptoms does not rule out     myocardial infarction with certainty.     If myocardial infarction is still suspected,     repeat the test at appropriate intervals.  POCT I-STAT TROPONIN I     Status: None   Collection Time    09/30/13 11:09 AM      Result Value Range   Troponin i, poc 0.00  0.00 - 0.08 ng/mL   Comment 3            Comment: Due to the release kinetics of cTnI,     a negative result within the first hours     of the onset of symptoms does not rule out     myocardial infarction with certainty.     If myocardial infarction is still suspected,     repeat the test at appropriate intervals.  POCT I-STAT, CHEM 8     Status: Abnormal   Collection Time    09/30/13 11:11 AM      Result Value Range   Sodium 140  137 - 147 mEq/L   Potassium 4.2  3.7 - 5.3 mEq/L  Chloride 101  96 - 112 mEq/L   BUN 29 (*) 6 - 23 mg/dL   Creatinine, Ser 1.00  0.50 - 1.10 mg/dL   Glucose, Bld 99  70 - 99 mg/dL   Calcium, Ion 1.28  1.13 - 1.30 mmol/L   TCO2 28  0 - 100 mmol/L   Hemoglobin 11.9 (*) 12.0 - 15.0 g/dL   HCT 35.0 (*) 36.0 - 46.0 %  GLUCOSE, CAPILLARY     Status: None   Collection Time    09/30/13 12:03 PM      Result Value Range   Glucose-Capillary 93  70 - 99 mg/dL   Comment 1 Documented in Chart     Comment 2 Notify RN    URINE RAPID DRUG SCREEN (HOSP PERFORMED)     Status: None   Collection Time    09/30/13 12:40 PM      Result Value Range   Opiates NONE DETECTED  NONE DETECTED   Cocaine NONE DETECTED  NONE DETECTED   Benzodiazepines NONE DETECTED  NONE DETECTED   Amphetamines NONE DETECTED  NONE DETECTED   Tetrahydrocannabinol NONE DETECTED  NONE DETECTED   Barbiturates NONE DETECTED  NONE DETECTED   Comment:             DRUG SCREEN FOR MEDICAL PURPOSES     ONLY.  IF CONFIRMATION IS NEEDED     FOR ANY PURPOSE, NOTIFY LAB     WITHIN 5 DAYS.                LOWEST DETECTABLE LIMITS     FOR URINE DRUG SCREEN     Drug Class       Cutoff (ng/mL)     Amphetamine      1000     Barbiturate      200     Benzodiazepine   888     Tricyclics       916     Opiates          300     Cocaine          300     THC              50  URINALYSIS, ROUTINE W REFLEX MICROSCOPIC     Status: Abnormal   Collection Time    09/30/13 12:40 PM      Result Value Range   Color, Urine YELLOW  YELLOW   APPearance TURBID (*) CLEAR   Specific Gravity, Urine 1.013  1.005 - 1.030   pH 8.0  5.0 - 8.0   Glucose, UA NEGATIVE  NEGATIVE mg/dL   Hgb urine dipstick MODERATE (*) NEGATIVE   Bilirubin Urine NEGATIVE  NEGATIVE   Ketones, ur NEGATIVE  NEGATIVE mg/dL   Protein, ur 100 (*) NEGATIVE mg/dL   Urobilinogen, UA 0.2  0.0 - 1.0 mg/dL   Nitrite NEGATIVE  NEGATIVE   Leukocytes, UA LARGE (*) NEGATIVE  URINE MICROSCOPIC-ADD ON     Status: Abnormal   Collection Time    09/30/13 12:40 PM      Result Value Range   Squamous Epithelial / LPF MANY (*) RARE   WBC, UA 21-50  <3 WBC/hpf   RBC / HPF 0-2  <3 RBC/hpf   Bacteria, UA FEW (*) RARE   Urine-Other LESS THAN 10 mL OF URINE SUBMITTED    LIPID PANEL     Status: None   Collection Time    10/01/13  4:46 AM  Result Value Range   Cholesterol 134  0 - 200 mg/dL   Triglycerides 87  <150 mg/dL   HDL 65  >39 mg/dL   Total CHOL/HDL Ratio 2.1     VLDL 17  0 - 40 mg/dL   LDL Cholesterol 52  0 - 99 mg/dL   Comment:            Total Cholesterol/HDL:CHD Risk     Coronary Heart Disease Risk Table                         Men   Women      1/2 Average Risk   3.4   3.3      Average Risk       5.0   4.4      2 X Average Risk   9.6   7.1      3 X Average Risk  23.4   11.0                Use the calculated Patient Ratio     above and the CHD Risk Table     to determine the patient's CHD  Risk.                ATP III CLASSIFICATION (LDL):      <100     mg/dL   Optimal      100-129  mg/dL   Near or Above                        Optimal      130-159  mg/dL   Borderline      160-189  mg/dL   High      >190     mg/dL   Very High    Ct Angio Head W/cm &/or Wo Cm  09/30/2013   CLINICAL DATA:  Right eye surgery 1 day ago for retinal disc abdomen. Developed garbled speech and possible syncopal episode  EXAM: CT ANGIOGRAPHY HEAD AND NECK  TECHNIQUE: Multidetector CT imaging of the head and neck was performed using the standard protocol during bolus administration of intravenous contrast. Multiplanar CT image reconstructions including MIPs were obtained to evaluate the vascular anatomy. Carotid stenosis measurements (when applicable) are obtained utilizing NASCET criteria, using the distal internal carotid diameter as the denominator.  CONTRAST:  71m OMNIPAQUE IOHEXOL 350 MG/ML SOLN  COMPARISON:  Noncontrast CT head earlier today.  FINDINGS: CTA HEAD FINDINGS  Moderate cerebral and cerebellar atrophy. No cortical hypodensity. No abnormal postcontrast enhancement. Biparietal thinning. Major dural venous sinuses patent. Postop changes in the right globe.  50% cavernous ICA stenosis on the right. Calcific non stenotic atheromatous change in the cavernous region on the left. Widely patent supraclinoid ICAs bilaterally. Widely patent basilar artery with right vertebral dominant. Left vertebral ends in PICA. No intracranial stenosis or occlusion is evident.  Review of the MIP images confirms the above findings.  CTA NECK FINDINGS  Transverse arch heavily calcified with some mural thrombus proximally. Conventional branching of great vessels from the arch. Moderate atheromatous change at the right and left subclavian origin. Right vertebral dominant with left vertebral hypoplastic.  Heavily calcified plaque at the right carotid bifurcation extending into the proximal right internal carotid artery. Ratio of  2.9/ 4.6 proximal/distal is consistent with a less than 50% stenosis. No ulceration or soft plaque. No evidence for dissection.  Extensive heavily calcified left carotid bifurcation with  severe luminal narrowing. Ratio of 0.8/5.2 proximal/distal consistent with a 80-90% stenosis. No ulceration or soft plaque. No evidence for dissection.  The patient is status post C3- C6 posterior fusion with laminectomy. Moderate anterolisthesis T1 on T2 is degenerative in nature. There is healing of the previously identified C5 fracture with restoration of normal alignment. Instrumentation grossly intact. There is fusion at least across the C3-4 interspace. There is nonunion of a type 2 odontoid fracture with mild canal stenosis on the right but no apparent vertebral artery compromise. No lung apex lesion. No neck masses. No osseous destructive lesion although skeletal osteopenia is noted.  Review of the MIP images confirms the above findings.  IMPRESSION: 80-90% heavily calcified left internal carotid artery stenosis. This is potentially flow reducing.  No intracranial stenosis, occlusion, or visible acute infarct.  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 No apparent vertebral artery compromise.  Previous C3 through C6 fusion with healing of C5 fracture.  Vascular Findings reviewed with ordering provider.   Electronically Signed   By: Rolla Flatten M.D.   On: 09/30/2013 12:47   Dg Chest 2 View  10/01/2013   CLINICAL DATA:  Stroke symptoms  EXAM: CHEST  2 VIEW  COMPARISON:  PA and lateral chest x-ray dated March 19, 2008.  FINDINGS: The lungs are mildly hypoinflated. There are increased lung markings at both lung bases consistent with atelectasis or pneumonia. The hemidiaphragms remain visible but there is blunting of the costophrenic angles consistent with small bilateral pleural effusions. The cardiopericardial silhouette is enlarged. The central pulmonary vascularity is engorged. There is  chronic deformity of the midshaft of the right clavicle.  IMPRESSION: 1. New bibasilar increased density is consistent with atelectasis or pneumonia. There are small bilateral pleural effusions. 2. There is enlargement of the cardiac silhouette with prominence of the central pulmonary vascularity consistent with low-grade CHF.   Electronically Signed   By: David  Martinique   On: 10/01/2013 09:20   Ct Head Wo Contrast  09/30/2013   CLINICAL DATA:  Code stroke.  EXAM: CT HEAD WITHOUT CONTRAST  TECHNIQUE: Contiguous axial images were obtained from the base of the skull through the vertex without intravenous contrast.  COMPARISON:  12/25/2012  FINDINGS: There is no evidence of intra-axial nor extra-axial fluid collections, no evidence of acute hemorrhage. Diffuse cortical atrophy is identified. Mild areas of low attenuation project within the subcortical, deep and periventricular white matter regions. Ventricles and cisterns are patent. There is no evidence of subfalcine or tonsillar herniation. Iatrogenic changes are appreciated within the right globe consistent with recent surgical intervention. There is mild mucosal thickening within the ethmoid air cells. Remaining visualized paranasal sinuses and mastoid air cells are patent. There is no evidence of a depressed skull fracture. The bones are osteopenic.  IMPRESSION: Involutional and chronic changes without evidence of acute intracranial abnormalities. Iatrogenic changes are appreciated within the right globe. These results were called by telephone at the time of interpretation on 09/30/2013 at 11:35 AM to Dr. Nicole Kindred, who verbally acknowledged these results.   Electronically Signed   By: Margaree Mackintosh M.D.   On: 09/30/2013 11:36   Ct Angio Neck W/cm &/or Wo/cm  09/30/2013   CLINICAL DATA:  Right eye surgery 1 day ago for retinal disc abdomen. Developed garbled speech and possible syncopal episode  EXAM: CT ANGIOGRAPHY HEAD AND NECK  TECHNIQUE: Multidetector CT  imaging of the head and neck was performed using the standard protocol during bolus administration  of intravenous contrast. Multiplanar CT image reconstructions including MIPs were obtained to evaluate the vascular anatomy. Carotid stenosis measurements (when applicable) are obtained utilizing NASCET criteria, using the distal internal carotid diameter as the denominator.  CONTRAST:  33m OMNIPAQUE IOHEXOL 350 MG/ML SOLN  COMPARISON:  Noncontrast CT head earlier today.  FINDINGS: CTA HEAD FINDINGS  Moderate cerebral and cerebellar atrophy. No cortical hypodensity. No abnormal postcontrast enhancement. Biparietal thinning. Major dural venous sinuses patent. Postop changes in the right globe.  50% cavernous ICA stenosis on the right. Calcific non stenotic atheromatous change in the cavernous region on the left. Widely patent supraclinoid ICAs bilaterally. Widely patent basilar artery with right vertebral dominant. Left vertebral ends in PICA. No intracranial stenosis or occlusion is evident.  Review of the MIP images confirms the above findings.  CTA NECK FINDINGS  Transverse arch heavily calcified with some mural thrombus proximally. Conventional branching of great vessels from the arch. Moderate atheromatous change at the right and left subclavian origin. Right vertebral dominant with left vertebral hypoplastic.  Heavily calcified plaque at the right carotid bifurcation extending into the proximal right internal carotid artery. Ratio of 2.9/ 4.6 proximal/distal is consistent with a less than 50% stenosis. No ulceration or soft plaque. No evidence for dissection.  Extensive heavily calcified left carotid bifurcation with severe luminal narrowing. Ratio of 0.8/5.2 proximal/distal consistent with a 80-90% stenosis. No ulceration or soft plaque. No evidence for dissection.  The patient is status post C3- C6 posterior fusion with laminectomy. Moderate anterolisthesis T1 on T2 is degenerative in nature. There is healing  of the previously identified C5 fracture with restoration of normal alignment. Instrumentation grossly intact. There is fusion at least across the C3-4 interspace. There is nonunion of a type 2 odontoid fracture with mild canal stenosis on the right but no apparent vertebral artery compromise. No lung apex lesion. No neck masses. No osseous destructive lesion although skeletal osteopenia is noted.  Review of the MIP images confirms the above findings.  IMPRESSION: 80-90% heavily calcified left internal carotid artery stenosis. This is potentially flow reducing.  No intracranial stenosis, occlusion, or visible acute infarct.  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 No apparent vertebral artery compromise.  Previous C3 through C6 fusion with healing of C5 fracture.  Vascular Findings reviewed with ordering provider.   Electronically Signed   By: JRolla FlattenM.D.   On: 09/30/2013 12:47   Mr Brain Wo Contrast  10/01/2013   CLINICAL DATA:  Acute onset mental status changes and difficulty speaking.  EXAM: MRI HEAD WITHOUT CONTRAST  MRA HEAD WITHOUT CONTRAST  TECHNIQUE: Multiplanar, multiecho pulse sequences of the brain and surrounding structures were obtained without intravenous contrast. Angiographic images of the head were obtained using MRA technique without contrast.  COMPARISON:  Head CT and CTA 09/30/2013  FINDINGS: MRI HEAD FINDINGS  Limited visualization of the upper cervical spine demonstrate nonunited dens fracture, better seen on recent CT. Incidental note is made of a partially empty sella. Restricted diffusion is present in cortical and subcortical locations in the left temporoparietal region as well as posterior left insula, consistent with acute infarcts. There is mild edema within these regions of infarct without significant mass effect. There is also a punctate focus of restricted diffusion in left frontal lobe cortex more superiorly. There is no evidence of  intracranial hemorrhage. There is mild-to-moderate generalized cerebral atrophy. Scattered, small foci of T2 hyperintensity within the subcortical and deep cerebral white matter bilaterally as  well as within the pons are nonspecific but compatible with mild to moderate chronic small vessel ischemic disease. There is no midline shift or extra-axial fluid collection. Major intracranial vascular flow voids are unremarkable. Abnormal signal within the right globe is likely related to recent surgery. The left maxillary sinus is small.  MRA HEAD FINDINGS  The visualized distal vertebral arteries are patent. The right vertebral artery is dominant the vertebral artery ending in PICA. Right PICA is patent. Basilar artery is patent. SCAs are patent. There is a fetal origin of the left PCA. PCAs are otherwise unremarkable. Internal carotid arteries are patent from skullbase to carotid terminus. Mild right greater than left cavernous carotid atherosclerosis is again seen, better demonstrated on recent CTA. ACA and MCA origins and visualized branches are patent and unremarkable. There is a patent anterior communicating artery. No intracranial aneurysm is identified.  IMPRESSION: 1. Acute left MCA infarct predominantly involving the temporoparietal region. 2. No evidence of major intracranial arterial occlusion or flow-limiting stenosis. These results will be called to the ordering clinician or representative by the Radiologist Assistant, and communication documented in the PACS Dashboard.   Electronically Signed   By: Logan Bores   On: 10/01/2013 09:22   Mr Jodene Nam Head/brain Wo Cm  10/01/2013   CLINICAL DATA:  Acute onset mental status changes and difficulty speaking.  EXAM: MRI HEAD WITHOUT CONTRAST  MRA HEAD WITHOUT CONTRAST  TECHNIQUE: Multiplanar, multiecho pulse sequences of the brain and surrounding structures were obtained without intravenous contrast. Angiographic images of the head were obtained using MRA technique  without contrast.  COMPARISON:  Head CT and CTA 09/30/2013  FINDINGS: MRI HEAD FINDINGS  Limited visualization of the upper cervical spine demonstrate nonunited dens fracture, better seen on recent CT. Incidental note is made of a partially empty sella. Restricted diffusion is present in cortical and subcortical locations in the left temporoparietal region as well as posterior left insula, consistent with acute infarcts. There is mild edema within these regions of infarct without significant mass effect. There is also a punctate focus of restricted diffusion in left frontal lobe cortex more superiorly. There is no evidence of intracranial hemorrhage. There is mild-to-moderate generalized cerebral atrophy. Scattered, small foci of T2 hyperintensity within the subcortical and deep cerebral white matter bilaterally as well as within the pons are nonspecific but compatible with mild to moderate chronic small vessel ischemic disease. There is no midline shift or extra-axial fluid collection. Major intracranial vascular flow voids are unremarkable. Abnormal signal within the right globe is likely related to recent surgery. The left maxillary sinus is small.  MRA HEAD FINDINGS  The visualized distal vertebral arteries are patent. The right vertebral artery is dominant the vertebral artery ending in PICA. Right PICA is patent. Basilar artery is patent. SCAs are patent. There is a fetal origin of the left PCA. PCAs are otherwise unremarkable. Internal carotid arteries are patent from skullbase to carotid terminus. Mild right greater than left cavernous carotid atherosclerosis is again seen, better demonstrated on recent CTA. ACA and MCA origins and visualized branches are patent and unremarkable. There is a patent anterior communicating artery. No intracranial aneurysm is identified.  IMPRESSION: 1. Acute left MCA infarct predominantly involving the temporoparietal region. 2. No evidence of major intracranial arterial  occlusion or flow-limiting stenosis. These results will be called to the ordering clinician or representative by the Radiologist Assistant, and communication documented in the PACS Dashboard.   Electronically Signed   By: Logan Bores  On: 10/01/2013 09:22    Review of Systems  Eyes: Positive for blurred vision. Negative for pain and discharge.  All other systems reviewed and are negative.   Blood pressure 170/56, pulse 67, temperature 97.7 F (36.5 C), temperature source Oral, resp. rate 16, height _0  (1.6 m), weight 77.3 kg (170 lb 6.7 oz), SpO2 100.00%. Physical Exam  Eyes: Right conjunctiva is injected.      Assessment/Plan: 1. POD #2 s/p PPV/ EL/EC/ED/AFX for recurrecnt RD:   Retina attached  No infection  Pressure normal (20 mm Hg)  Continue all drops as ordered (Predforte/Cipro/Cosopt)  Return to [facility] in one week as out patient  [facility] [phone]) with all eye questions.   Ginni Eichler B 10/01/2013, 11:14 AM

## 2013-10-01 NOTE — Progress Notes (Signed)
VASCULAR LAB PRELIMINARY  PRELIMINARY  PRELIMINARY  PRELIMINARY  Carotid Dopplers completed.    Preliminary report:  There is 40-59% right ICA stenosis.  There is 60-79% left ICA stenosis.  Vertebral artery flow is antegrade.  Ayah Cozzolino, RVT 10/01/2013, 11:04 AM

## 2013-10-01 NOTE — Progress Notes (Signed)
  Echocardiogram 2D Echocardiogram has been performed.  [name], [name] 10/01/2013, 4:44 PM

## 2013-10-01 NOTE — Progress Notes (Signed)
Stroke Team Progress Note  HISTORY [name] is an 80 y.o. female who underwent a vitrectomy yesterday by the retinal specialist and was returning for follow up The morning of 09/30/2013. She was normal and at baseline when she entered the office. Midway through exam she was noted to have a sudden onset confusion, inability to follow commands or express herself. She was not showing any lateralizing deficits. She was brought [facility] as a code stroke on arrival she continued to show confusion, and inability to express or follow commands.  Patient had a cervical fracture due to a fall out of bed in April of 2014. Since that time she had been in a wheelchair for the most part, although she can walk some with assistance. She had some resultant left sided weakness and had required assistance for most ADL's.   Date last known well: Date: 09/30/2013  Time last known well: Time: 10:45  tPA Given: No: recent surgery  NIHSS 7  mRankin 3   SUBJECTIVE The patient's 3 children her to bedside today. Multiple questions were answered. The patient seems somewhat improved although she still has severe expressive aphasia and word finding difficulties.  OBJECTIVE Most recent Vital Signs: Filed Vitals:   09/30/13 2053 10/01/13 0058 10/01/13 0320 10/01/13 0947  BP: 181/62 136/42 164/58 170/56  Pulse: 81 57 62 67  Temp: 99 F (37.2 C) 97.9 F (36.6 C) 97.2 F (36.2 C) 97.7 F (36.5 C)  TempSrc: Oral Axillary Oral Oral  Resp:  12 16 16   Height:      Weight:      SpO2: 99% 99% 100% 100%   CBG (last 3)   Recent Labs  09/30/13 1203  GLUCAP 93    IV Fluid Intake:     MEDICATIONS  . aspirin  300 mg Rectal Daily   Or  . aspirin  325 mg Oral Daily  . atorvastatin  40 mg Oral q1800  . ciprofloxacin  1 drop Right Eye QID  . ciprofloxacin  400 mg Intravenous Q12H  . dorzolamide-timolol  1 drop Right Eye BID  . enoxaparin (LOVENOX) injection  40 mg Subcutaneous Q24H  . metoprolol  50  mg Oral BID  . pantoprazole  40 mg Oral Daily  . pramipexole  0.25 mg Oral QHS  . prednisoLONE acetate  1 drop Right Eye QID   PRN:  acetaminophen, ALPRAZolam, HYDROmorphone (DILAUDID) injection, labetalol, morphine injection, pramipexole  Diet:  Cardiac thin liquids Activity:  As tolerated DVT Prophylaxis:  Lovenox  CLINICALLY SIGNIFICANT STUDIES Basic Metabolic Panel:   Recent Labs Lab 09/30/13 1102 09/30/13 1111  NA 139 140  K 4.3 4.2  CL 101 101  CO2 27  --   GLUCOSE 98 99  BUN 29* 29*  CREATININE 0.82 1.00  CALCIUM 9.6  --    Liver Function Tests:   Recent Labs Lab 09/30/13 1102  AST 32  ALT 26  ALKPHOS 137*  BILITOT 0.4  PROT 7.8  ALBUMIN 3.5   CBC:   Recent Labs Lab 09/30/13 1102 09/30/13 1111  WBC 11.7*  --   NEUTROABS 8.3*  --   HGB 10.6* 11.9*  HCT 32.7* 35.0*  MCV 90.8  --   PLT 299  --    Coagulation:   Recent Labs Lab 09/30/13 1102  LABPROT 13.2  INR 1.02   Cardiac Enzymes:   Recent Labs Lab 09/30/13 1102  TROPONINI <0.30   Urinalysis:   Recent Labs Lab 09/30/13 1240  COLORURINE  YELLOW  LABSPEC 1.013  PHURINE 8.0  GLUCOSEU NEGATIVE  HGBUR MODERATE*  BILIRUBINUR NEGATIVE  KETONESUR NEGATIVE  PROTEINUR 100*  UROBILINOGEN 0.2  NITRITE NEGATIVE  LEUKOCYTESUR LARGE*   Lipid Panel    Component Value Date/Time   CHOL 134 10/01/2013 0446   TRIG 87 10/01/2013 0446   HDL 65 10/01/2013 0446   CHOLHDL 2.1 10/01/2013 0446   VLDL 17 10/01/2013 0446   LDLCALC 52 10/01/2013 0446   HgbA1C  Lab Results  Component Value Date   HGBA1C 5.7* 10/01/2013    Urine Drug Screen:     Component Value Date/Time   LABOPIA NONE DETECTED 09/30/2013 1240   COCAINSCRNUR NONE DETECTED 09/30/2013 1240   LABBENZ NONE DETECTED 09/30/2013 1240   AMPHETMU NONE DETECTED 09/30/2013 1240   THCU NONE DETECTED 09/30/2013 1240   LABBARB NONE DETECTED 09/30/2013 1240    Alcohol Level:   Recent Labs Lab 09/30/13 1102  ETH <11    Ct Angio Head W/cm  &/or Wo Cm 09/30/2013    80-90% heavily calcified left internal carotid artery stenosis. This is potentially flow reducing.  No intracranial stenosis, occlusion, or visible acute infarct.  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 No apparent vertebral artery compromise.  Previous C3 through C6 fusion with healing of C5 fracture.    Dg Chest 2 View 10/01/2013    1. New bibasilar increased density is consistent with atelectasis or pneumonia. There are small bilateral pleural effusions. 2. There is enlargement of the cardiac silhouette with prominence of the central pulmonary vascularity consistent with low-grade   Ct Head Wo Contrast 09/30/2013    Involutional and chronic changes without evidence of acute intracranial abnormalities. Iatrogenic changes are appreciated within the right globe.    Mr Brain Wo Contrast 10/01/2013   1. Acute left MCA infarct predominantly involving the temporoparietal region. 2. No evidence of major intracranial arterial occlusion or flow-limiting stenosis.    2D Echocardiogram  pending  Carotid Doppler  Preliminary report: There is 40-59% right ICA stenosis. There is 60-79% left ICA stenosis. Vertebral artery flow is antegrade.   EKG  Sinus rhythm rate 86 beats per minute  Therapy Recommendations pending  Physical Exam    Neurologic Examination:   Mental Status:  Alert, severe expressive aphasia. Occasional nonsensical speech. At times able to answer questions appropriately. Follows some simple commands but inconsistently. Cranial Nerves:  II: Discs flat bilaterally; Visual fields grossly normal, pupils 8mm on the right and 2 mm on the left. The left is reactive and right is non reactive (surgical eye), round,  III,IV, VI: ptosis present right, extra-ocular motions intact bilaterally with greater movement noted when looking to the left  V,VII: smile symmetric, slight right NL Decrease on the right  VIII: hearing normal  bilaterally  IX,X: gag reflex present  XI: bilateral shoulder shrug  XII: midline tongue extension without atrophy or fasciculations  Motor:  Moving all extremities antigravity equally with no drift  Tone and bulk:normal tone throughout; no atrophy noted  Sensory: withdraws to gross noxious stimuli bilateral legs and arms  Deep Tendon Reflexes:  Right: Upper Extremity Left: Upper extremity  biceps (C-5 to C-6) 2/4 biceps (C-5 to C-6) 2/4  tricep (C7) 2/4 triceps (C7) 2/4  Brachioradialis (C6) 2/4 Brachioradialis (C6) 2/4  Lower Extremity Lower Extremity  quadriceps (L-2 to L-4) 2/4 quadriceps (L-2 to L-4) 2/4  Achilles (S1) 0/4 Achilles (S1) 0/4  Plantars:  Right: downgoing Left: downgoing  Cerebellar:  Unable to assess due to aphasia  Gait: not tested  CV: pulses palpable throughout    ASSESSMENT Ms. [name] is a 80 y.o. female presenting with confusion, inability to follow commands, and inability to communicate. TPA was not given secondary to recent surgery. An MRI revealed an acute left MCA infarct predominantly involving the temporoparietal region. Infarct felt to be secondary to left carotid stenosis. On aspirin 81 mg orally every day prior to admission. Now on aspirin 325 mg orally every day for secondary stroke prevention. Patient with resultant confusion. Work up underway.   Vitrectomy 09/29/2013  Coronary artery disease with CHF  Hypertension  Hyperlipidemia - Pravachol prior to admission - now on Lipitor.  80-90% left internal carotid artery stenosis CT angiogram.  Cervical fracture after falling with surgical repair Dr. Danielle Dess 12/28/2012. Family reports residual left upper extremity weakness.   Hospital day # 1  TREATMENT/PLAN  Continue aspirin 325 mg orally every day for secondary stroke prevention.  Await 2-D echo  Await therapy evaluations  Vascular surgery consult for left internal carotid artery stenosis. Have discussed with the family and  they want a surgical opinion  Delton See PA-C [facility] Pager [phone] 10/01/2013, 12:26 PM  I have personally obtained a history, examined the patient, evaluated imaging results, and formulated the assessment and plan of care. I agree with the above. Lesly Dukes

## 2013-10-01 NOTE — Consult Note (Signed)
[facility] HISTORY AND PHYSICAL    Requesting: Anne Hahn, MD Reason for consult: symptomatic left carotid stenosis History of Present Illness:  Patient is a 80 y.o. year old female who presents for evaluation of symptomatic left internal carotid stenosis. Pt with aphasia after recent eye procedure.  Some improvement overnight but still not back to baseline.  No obvious motor deficits but has some mild left upper extremity weakness baseline from prior Cspine fracture last spring.  No prior stroke or TIA.   Other medical problems include hypertension, deconditioning, sacral decubitus ulcer, hyperlipidemia, coronary artery disease (previously seen by Dr Antoine Poche several years ago).  She is on aspirin.  Past Medical History  Diagnosis Date  . CHF (congestive heart failure)   . Coronary artery disease   . Hypertension   . Arthritis   . Edema of extremities   . Anxiety   . HLD (hyperlipidemia)     Past Surgical History  Procedure Laterality Date  . Total knee arthroplasty Bilateral     knee replacements  . Lumbar disc surgery    . Cholecystectomy    . Posterior cervical fusion/foraminotomy N/A 12/28/2012    Procedure: POSTERIOR CERVICAL FUSION/FORAMINOTOMY LEVEL 3;  Surgeon: Barnett Abu, MD;  Location: [facility];  Service: Neurosurgery;  Laterality: N/A;  Posterior Cervical Three-Six Laminectomy and Fusion  . Angioplasty    . Tonsillectomy      Social History History  Substance Use Topics  . Smoking status: Never Smoker   . Smokeless tobacco: Never Used  . Alcohol Use: Yes     Comment: rarely    Family History Family History  Problem Relation Age of Onset  . Dementia Sister     x 3  . CAD Brother   . Breast cancer Sister     x 3  . Heart disease Sister   . Heart disease Son     x 2  . Alzheimer's disease Sister     Allergies  Allergies  Allergen Reactions  . Penicillins Shortness Of Breath    Throat closed up and had hives and nausea      Current Facility-Administered Medications  Medication Dose Route Frequency Provider Last Rate Last Dose  . acetaminophen (TYLENOL) tablet 650 mg  650 mg Oral Q4H PRN Jeralyn Bennett, MD      . ALPRAZolam Prudy Feeler) tablet 0.25 mg  0.25 mg Oral QHS PRN Jeralyn Bennett, MD   0.25 mg at 09/30/13 2130  . aspirin suppository 300 mg  300 mg Rectal Daily Jeralyn Bennett, MD       Or  . aspirin tablet 325 mg  325 mg Oral Daily Jeralyn Bennett, MD   325 mg at 10/01/13 1039  . atorvastatin (LIPITOR) tablet 40 mg  40 mg Oral q1800 Jeralyn Bennett, MD   40 mg at 09/30/13 2131  . ciprofloxacin (CILOXAN) 0.3 % ophthalmic solution 1 drop  1 drop Right Eye QID Donnel Saxon, MD   1 drop at 10/01/13 1409  . ciprofloxacin (CIPRO) IVPB 400 mg  400 mg Intravenous Q12H Rodolph Bong, MD   400 mg at 10/01/13 1032  . dorzolamide-timolol (COSOPT) 22.3-6.8 MG/ML ophthalmic solution 1 drop  1 drop Right Eye BID Donnel Saxon, MD   1 drop at 10/01/13 1040  . enoxaparin (LOVENOX) injection 40 mg  40 mg Subcutaneous Q24H Jeralyn Bennett, MD   40 mg at 09/30/13 2130  . HYDROmorphone (DILAUDID) injection 1 mg  1 mg Intravenous Q2H  PRN Jeralyn BennettEzequiel Zamora, MD   1 mg at 10/01/13 1409  . labetalol (NORMODYNE,TRANDATE) injection 10 mg  10 mg Intravenous Q2H PRN Jeralyn BennettEzequiel Zamora, MD      . metoprolol (LOPRESSOR) tablet 50 mg  50 mg Oral BID Jeralyn BennettEzequiel Zamora, MD   50 mg at 10/01/13 1039  . morphine 2 MG/ML injection 2 mg  2 mg Intravenous Q3H PRN Jeralyn BennettEzequiel Zamora, MD   2 mg at 09/30/13 1712  . pantoprazole (PROTONIX) EC tablet 40 mg  40 mg Oral Daily Jeralyn BennettEzequiel Zamora, MD   40 mg at 10/01/13 1044  . pramipexole (MIRAPEX) tablet 0.25 mg  0.25 mg Oral QHS Jeralyn BennettEzequiel Zamora, MD   0.25 mg at 09/30/13 2132  . pramipexole (MIRAPEX) tablet 0.25 mg  0.25 mg Oral TID PRN Jeralyn BennettEzequiel Zamora, MD   0.25 mg at 10/01/13 1209  . prednisoLONE acetate (PRED FORTE) 1 % ophthalmic suspension 1 drop  1 drop Right Eye QID Donnel SaxonJason B Sanders, MD   1 drop at  10/01/13 1409    ROS:   General:  No weight loss, Fever, chills  HEENT: No recent headaches, no nasal bleeding, no visual changes, no sore throat  Neurologic: No dizziness, blackouts, seizures. + recent symptoms of stroke or mini- stroke. + recent episodes of slurred speech  Cardiac: No recent episodes of chest pain/pressure, no shortness of breath at rest.  + shortness of breath with exertion.  Denies history of atrial fibrillation or irregular heartbeat  Vascular: No history of rest pain in feet.  No history of claudication.  No history of non-healing ulcer, No history of DVT   Pulmonary: No home oxygen, no productive cough, no hemoptysis,  No asthma or wheezing  Musculoskeletal:  [x ] Arthritis, [ ]  Low back pain,  [ ]  Joint pain  Hematologic:No history of hypercoagulable state.  No history of easy bleeding.  No history of anemia  Gastrointestinal: No hematochezia or melena,  No gastroesophageal reflux, no trouble swallowing  Urinary: [ ]  chronic Kidney disease, [ ]  on HD - [ ]  MWF or [ ]  TTHS, [ ]  Burning with urination, [ ]  Frequent urination, [ ]  Difficulty urinating;   Skin: No rashes  Psychological: No history of anxiety,  No history of depression   Physical Examination  Filed Vitals:   10/01/13 0058 10/01/13 0320 10/01/13 0947 10/01/13 1503  BP: 136/42 164/58 170/56 146/46  Pulse: 57 62 67 56  Temp: 97.9 F (36.6 C) 97.2 F (36.2 C) 97.7 F (36.5 C) 97.4 F (36.3 C)  TempSrc: Axillary Oral Oral Oral  Resp: 12 16 16 16   Height:      Weight:      SpO2: 99% 100% 100% 100%    Body mass index is 30.2 kg/(m^2).  General:  Alert and oriented, no acute distress HEENT: Normal Neck: No bruit or JVD Pulmonary: Clear to auscultation bilaterally Cardiac: Regular Rate and Rhythm  Abdomen: Soft, non-tender, non-distended,  Skin: No rash Extremity Pulses:  2+ radial, brachial,calcified non pulsatile femoral, absent dorsalis pedis, posterior tibial pulses  bilaterally Musculoskeletal: No deformity or edema  Neurologic: Upper and lower extremity motor 5/5 and symmetric  DATA:  Carotid duplex 60-80 left ICA 40-60% right ICA, CTA >80% left ICA calcified appears to have type 1 arch but cut off portion of upper chest, MRI head large parietal temporal infarct, cerebral atrophy  BMET    Component Value Date/Time   NA 140 09/30/2013 1111   K 4.2 09/30/2013 1111   CL 101  09/30/2013 1111   CO2 27 09/30/2013 1102   GLUCOSE 99 09/30/2013 1111   BUN 29* 09/30/2013 1111   CREATININE 1.00 09/30/2013 1111   CALCIUM 9.6 09/30/2013 1102   GFRNONAA 66* 09/30/2013 1102   GFRAA 76* 09/30/2013 1102     CBC    Component Value Date/Time   WBC 11.7* 09/30/2013 1102   RBC 3.60* 09/30/2013 1102   HGB 11.9* 09/30/2013 1111   HCT 35.0* 09/30/2013 1111   PLT 299 09/30/2013 1102   MCV 90.8 09/30/2013 1102   MCH 29.4 09/30/2013 1102   MCHC 32.4 09/30/2013 1102   RDW 15.7* 09/30/2013 1102   LYMPHSABS 2.6 09/30/2013 1102   MONOABS 0.7 09/30/2013 1102   EOSABS 0.1 09/30/2013 1102   BASOSABS 0.1 09/30/2013 1102      ASSESSMENT:  Acute large area of brain infarct most likely from carotid.  Will give 4-6 weeks to heal brain and then consider stent vs CEA.  On exam she has calcified femorals which may prohibit access for angio or stent.  We will get arterial duplex as outpt to examine further.  Neck surgery limits her mobility which may may CEA more difficult.   PLAN:  See above.  Plan discussed with family 3 daughters and pt.  Fabienne Bruns, MD [facility] [place] Office: [phone] Pager: [phone]

## 2013-10-01 NOTE — Evaluation (Addendum)
Physical Therapy Evaluation Patient Details Name: [name] MRN: [id number] DOB: [birth year] Today's Date: 10/01/2013 Time: [id number] PT Time Calculation (min): 42 min  PT Assessment / Plan / Recommendation History of Present Illness  Patient is a 80 y.o. year old female who presents for evaluation of symptomatic left internal carotid stenosis. Pt with aphasia after recent eye procedure.  Some improvement overnight but still not back to baseline.  No obvious motor deficits but has some mild left upper extremity weakness baseline from prior Cspine fracture last spring.   Clinical Impression  Patient presents with problems listed below.  Will benefit from acute PT to maximize independence prior to discharge.  Patient was able to perform transfers and ambulation with supervision with RW pta. Recommend Inpatient Rehab consult for comprehensive therapies to return patient to supervision level with goal to return home with 24 hour assist.    PT Assessment  Patient needs continued PT services    Follow Up Recommendations  CIR;Supervision/Assistance - 24 hour    Does the patient have the potential to tolerate intense rehabilitation      Barriers to Discharge        Equipment Recommendations  None recommended by PT    Recommendations for Other Services Rehab consult   Frequency Min 4X/week    Precautions / Restrictions Precautions Precautions: Fall Precaution Comments: Patient fearful of falling in standing Restrictions Weight Bearing Restrictions: No   Pertinent Vitals/Pain       Mobility  Bed Mobility Overal bed mobility: Needs Assistance Bed Mobility: Supine to Sit;Sit to Supine Supine to sit: Max assist;HOB elevated Sit to supine: Max assist;+2 for physical assistance General bed mobility comments: Verbal and tactile cues for technique.  Used bed pad to assist patient to sitting.  Once upright, patient with good sitting balance. Transfers Overall transfer level: Needs  assistance Equipment used: 1 person hand held assist (PT sitting in front of patient, assisting at low back) Transfers: Sit to/from Stand Sit to Stand: Total assist General transfer comment: Verbal cues for technique and hand placement.  Assist to rise to standing and for balance during standing.  Able to stand x 2 for 45 seconds each time.  Posture flexed, with cues to look up and stand upright. Modified Rankin (Stroke Patients Only) Pre-Morbid Rankin Score: Moderately severe disability Modified Rankin: Severe disability    Exercises     PT Diagnosis: Difficulty walking;Generalized weakness;Hemiplegia non-dominant side  PT Problem List: Decreased strength;Decreased activity tolerance;Decreased balance;Decreased mobility;Decreased knowledge of use of DME PT Treatment Interventions: DME instruction;Gait training;Functional mobility training;Balance training;Neuromuscular re-education;Patient/family education     PT Goals(Current goals can be found in the care plan section) Acute Rehab PT Goals Patient Stated Goal: To be able to go home PT Goal Formulation: With patient/family Time For Goal Achievement: 10/15/13 Potential to Achieve Goals: Good  Visit Information  Last PT Received On: 10/01/13 Assistance Needed: +2 History of Present Illness: Patient is a 80 y.o. year old female who presents for evaluation of symptomatic left internal carotid stenosis. Pt with aphasia after recent eye procedure.  Some improvement overnight but still not back to baseline.  No obvious motor deficits but has some mild left upper extremity weakness baseline from prior Cspine fracture last spring.        Prior Functioning  Home Living Family/patient expects to be discharged to:: Inpatient rehab Living Arrangements: Children Available Help at Discharge: Available 24 hours/day;Family;Home health Type of Home: House Home Access: Ramped entrance Home Layout: One level Home  Equipment: Dan Humphreys - 2  wheels;Shower seat;Wheelchair - [employer]  Lives With: Family;Other (Comment) (hired caregiver ) Prior Function Level of Independence: Needs assistance Gait / Transfers Assistance Needed: Since fall in April, patient primarily uses w/c.  Is able to ambulate with RW very short distances in house with assist. ADL's / Homemaking Assistance Needed: Assist with bathing, dressing, meal prep, housekeeping Communication Communication: Receptive difficulties;Expressive difficulties Dominant Hand: Right    Cognition  Cognition Arousal/Alertness: Awake/alert Behavior During Therapy: Anxious Overall Cognitive Status: Within Functional Limits for tasks assessed Difficult to assess due to: Impaired communication    Extremity/Trunk Assessment Upper Extremity Assessment Upper Extremity Assessment: Generalized weakness;LUE deficits/detail LUE Deficits / Details: Decreased strength grossly 3-/5.  Decreased shoulder ROM. Arthritic changes wrists/hands LUE Coordination: decreased gross motor Lower Extremity Assessment Lower Extremity Assessment: RLE deficits/detail;LLE deficits/detail RLE Deficits / Details: Strength grossly 3-/5 RLE Coordination: decreased gross motor LLE Deficits / Details: Strength grossly 3-/5 LLE Coordination: decreased gross motor   Balance Balance Overall balance assessment: Needs assistance Sitting-balance support: Single extremity supported;Feet supported Sitting balance-Leahy Scale: Good Standing balance support: Bilateral upper extremity supported Standing balance-Leahy Scale: Poor Standing balance comment: Patient fearful of falling in standing.  Flexed posture, leaning posteriorly.  End of Session PT - End of Session Equipment Utilized During Treatment: Gait belt Activity Tolerance: Patient limited by fatigue Patient left: in bed;with call bell/phone within reach;with bed alarm set;with family/visitor present Nurse Communication: Mobility status  GP      Vena Austria 10/01/2013, 6:33 PM Durenda Hurt. Renaldo Fiddler, [facility] Acute Rehab Services Pager [phone]

## 2013-10-02 LAB — BASIC METABOLIC PANEL
BUN: 23 mg/dL (ref 6–23)
CALCIUM: 9.1 mg/dL (ref 8.4–10.5)
CO2: 28 mEq/L (ref 19–32)
Chloride: 100 mEq/L (ref 96–112)
Creatinine, Ser: 0.87 mg/dL (ref 0.50–1.10)
GFR calc non Af Amer: 61 mL/min — ABNORMAL LOW (ref >90)
GFR, EST AFRICAN AMERICAN: 71 mL/min — AB (ref >90)
Glucose, Bld: 99 mg/dL (ref 70–99)
POTASSIUM: 4.4 meq/L (ref 3.7–5.3)
Sodium: 139 mEq/L (ref 137–147)

## 2013-10-02 LAB — URINE CULTURE: Colony Count: 50000

## 2013-10-02 LAB — PRO B NATRIURETIC PEPTIDE: Pro B Natriuretic peptide (BNP): 2552 pg/mL — ABNORMAL HIGH (ref 0–450)

## 2013-10-02 MED ORDER — CIPROFLOXACIN IN D5W 400 MG/200ML IV SOLN
400.0000 mg | Freq: Two times a day (BID) | INTRAVENOUS | Status: AC
  Administered 2013-10-02: 400 mg via INTRAVENOUS
  Filled 2013-10-02: qty 200

## 2013-10-02 MED ORDER — CIPROFLOXACIN HCL 500 MG PO TABS
500.0000 mg | ORAL_TABLET | Freq: Two times a day (BID) | ORAL | Status: DC
  Administered 2013-10-03 – 2013-10-04 (×3): 500 mg via ORAL
  Filled 2013-10-02 (×5): qty 1

## 2013-10-02 MED ORDER — LISINOPRIL 5 MG PO TABS
5.0000 mg | ORAL_TABLET | Freq: Every day | ORAL | Status: DC
  Administered 2013-10-02 – 2013-10-03 (×2): 5 mg via ORAL
  Filled 2013-10-02 (×2): qty 1

## 2013-10-02 NOTE — Progress Notes (Signed)
OT Cancellation Note  Patient Details Name: [name] MRN: [id number] DOB: April 06, 1931   Cancelled Treatment:     Wound care nurse in room when OT entered. Spoke with nurse and pt very tired today. Will re-attempt evaluation tomorrow.   [name], [name] L OTR/L [phone] 10/02/2013, 2:47 PM

## 2013-10-02 NOTE — Consult Note (Signed)
WOC wound consult note Reason for Consult: Healing Stage III Pressure Ulcer on coccyx (POA) Wound type:Pressure ulcer Pressure Ulcer POA: Yes Measurement: 2.5cm x 2cm x 0.4cm in an area of periwound erythema secondary to moisture (urinary incontinence) measuring 14cm x 17cm.  The erythematous area blanches.  The immediate periwound area is macerated. Wound bed: clean, pink, pale, moist. Drainage (amount, consistency, odor) serous, no odor Periwound:As described above. Dressing procedure/placement/frequency: I recommend protection of the periwound area with a zinc-based skin protective cream and filling of the defect with a calcium alginate dressing topped with dry gauze.  This will have to be replaced periodically due to the incontinence, but it is more efficient than saline moistened gauze as we [facility] and the outpatient wound care center in granulating in this defect. Prevalon boots are provided as her heels are at risk due to immobility an diminished sensation as is a chair cushion for OOB use.  Guidelines are provided to redistribute pressure while OOB. WOC nursing team will not follow, but will remain available to this patient, the nursing and medical team.  Please re-consult if needed. Thanks, Ladona MowLaurie Onedia Vargus, MSN, RN, GNP, [facility], CWON-AP [phone])

## 2013-10-02 NOTE — Progress Notes (Signed)
Stroke Team Progress Note  HISTORY [name] is an 80 y.o. female who underwent a vitrectomy 09/29/2013 by the retinal specialist and was returning for follow up The morning of 09/30/2013. She was normal and at baseline when she entered the office. Midway through exam she was noted to have a sudden onset confusion, inability to follow commands or express herself. She was not showing any lateralizing deficits. She was [facility] as a code stroke on arrival she continued to show confusion, and inability to express or follow commands.  Patient had a cervical fracture due to a fall out of bed in April of 2014. Since that time she had been in a wheelchair for the most part, although she can walk some with assistance. She had some resultant left sided weakness and had required assistance for most ADL's.   Date last known well: Date: 09/30/2013  Time last known well: Time: 10:45  tPA Given: No: recent surgery  NIHSS 7  mRankin 3   SUBJECTIVE No family members present today. The patient's speech is much improved. She still has some aphasia and some difficulty following commands; however, she is much better today.  OBJECTIVE Most recent Vital Signs: Filed Vitals:   10/01/13 1751 10/01/13 2240 10/02/13 0057 10/02/13 0503  BP: 140/50 139/53 139/79 151/53  Pulse: 58 62 62 60  Temp: 97.6 F (36.4 C) 98 F (36.7 C) 97.4 F (36.3 C) 97.4 F (36.3 C)  TempSrc: Oral Oral Oral Oral  Resp: 16 18 22 20   Height:      Weight:      SpO2: 98% 100% 100% 100%   CBG (last 3)   Recent Labs  09/30/13 1203  GLUCAP 93    IV Fluid Intake:     MEDICATIONS  . aspirin  300 mg Rectal Daily   Or  . aspirin  325 mg Oral Daily  . atorvastatin  40 mg Oral q1800  . ciprofloxacin  1 drop Right Eye QID  . ciprofloxacin  400 mg Intravenous Q12H  . dorzolamide-timolol  1 drop Right Eye BID  . enoxaparin (LOVENOX) injection  40 mg Subcutaneous Q24H  . metoprolol  50 mg Oral BID  .  pantoprazole  40 mg Oral Daily  . pramipexole  0.25 mg Oral QHS  . prednisoLONE acetate  1 drop Right Eye QID   PRN:  acetaminophen, ALPRAZolam, HYDROmorphone (DILAUDID) injection, labetalol, morphine injection, pramipexole  Diet:  Cardiac thin liquids Activity:  As tolerated DVT Prophylaxis:  Lovenox  CLINICALLY SIGNIFICANT STUDIES Basic Metabolic Panel:   Recent Labs Lab 09/30/13 1102 09/30/13 1111 10/02/13 0555  NA 139 140 139  K 4.3 4.2 4.4  CL 101 101 100  CO2 27  --  28  GLUCOSE 98 99 99  BUN 29* 29* 23  CREATININE 0.82 1.00 0.87  CALCIUM 9.6  --  9.1   Liver Function Tests:   Recent Labs Lab 09/30/13 1102  AST 32  ALT 26  ALKPHOS 137*  BILITOT 0.4  PROT 7.8  ALBUMIN 3.5   CBC:   Recent Labs Lab 09/30/13 1102 09/30/13 1111  WBC 11.7*  --   NEUTROABS 8.3*  --   HGB 10.6* 11.9*  HCT 32.7* 35.0*  MCV 90.8  --   PLT 299  --    Coagulation:   Recent Labs Lab 09/30/13 1102  LABPROT 13.2  INR 1.02   Cardiac Enzymes:   Recent Labs Lab 09/30/13 1102  TROPONINI <0.30   Urinalysis:  Recent Labs Lab 09/30/13 1240  COLORURINE YELLOW  LABSPEC 1.013  PHURINE 8.0  GLUCOSEU NEGATIVE  HGBUR MODERATE*  BILIRUBINUR NEGATIVE  KETONESUR NEGATIVE  PROTEINUR 100*  UROBILINOGEN 0.2  NITRITE NEGATIVE  LEUKOCYTESUR LARGE*   Lipid Panel    Component Value Date/Time   CHOL 134 10/01/2013 0446   TRIG 87 10/01/2013 0446   HDL 65 10/01/2013 0446   CHOLHDL 2.1 10/01/2013 0446   VLDL 17 10/01/2013 0446   LDLCALC 52 10/01/2013 0446   HgbA1C  Lab Results  Component Value Date   HGBA1C 5.7* 10/01/2013    Urine Drug Screen:     Component Value Date/Time   LABOPIA NONE DETECTED 09/30/2013 1240   COCAINSCRNUR NONE DETECTED 09/30/2013 1240   LABBENZ NONE DETECTED 09/30/2013 1240   AMPHETMU NONE DETECTED 09/30/2013 1240   THCU NONE DETECTED 09/30/2013 1240   LABBARB NONE DETECTED 09/30/2013 1240    Alcohol Level:   Recent Labs Lab 09/30/13 1102  ETH  <11    Ct Angio Head W/cm &/or Wo Cm 09/30/2013    80-90% heavily calcified left internal carotid artery stenosis. This is potentially flow reducing.  No intracranial stenosis, occlusion, or visible acute infarct.  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 No apparent vertebral artery compromise.  Previous C3 through C6 fusion with healing of C5 fracture.    Dg Chest 2 View 10/01/2013    1. New bibasilar increased density is consistent with atelectasis or pneumonia. There are small bilateral pleural effusions. 2. There is enlargement of the cardiac silhouette with prominence of the central pulmonary vascularity consistent with low-grade   Ct Head Wo Contrast 09/30/2013    Involutional and chronic changes without evidence of acute intracranial abnormalities. Iatrogenic changes are appreciated within the right globe.    Mr Brain Wo Contrast 10/01/2013   1. Acute left MCA infarct predominantly involving the temporoparietal region. 2. No evidence of major intracranial arterial occlusion or flow-limiting stenosis.    2D Echocardiogram  Ejection fraction of 40-45%. No cardiac source of emboli identified.  Carotid Doppler  Preliminary report: There is 40-59% right ICA stenosis. There is 60-79% left ICA stenosis. Vertebral artery flow is antegrade.   EKG  Sinus rhythm rate 86 beats per minute  Therapy Recommendations - physical therapist recommends inpatient rehabilitation. Rehabilitation physician consult pending.  Physical Exam    Neurologic Examination:   Mental Status:  Alert still with some aphasia. Occasional nonsensical speech. Most of the time able to answer questions appropriately. Follows commands better but still needs cueing for 2 and 3 step commands. Cranial Nerves:  II: Discs flat bilaterally; Visual fields grossly normal, pupils 8mm on the right and 2 mm on the left. The left is reactive and right is non reactive (surgical eye), round,  III,IV,  VI: ptosis present right, extra-ocular motions intact bilaterally with greater movement noted when looking to the left  V,VII: smile symmetric, slight right NL Decrease on the right  VIII: hearing normal bilaterally  IX,X: gag reflex present  XI: bilateral shoulder shrug  XII: midline tongue extension without atrophy or fasciculations  Motor:  4/5 throughout. Tone and bulk:normal tone throughout; no atrophy noted  Sensory: withdraws to gross noxious stimuli bilateral legs and arms  Deep Tendon Reflexes:  Right: Upper Extremity Left: Upper extremity  biceps (C-5 to C-6) 2/4 biceps (C-5 to C-6) 2/4  tricep (C7) 2/4 triceps (C7) 2/4  Brachioradialis (C6) 2/4 Brachioradialis (C6) 2/4  Lower Extremity Lower Extremity  quadriceps (L-2 to L-4) 2/4 quadriceps (L-2 to L-4) 2/4  Achilles (S1) 0/4 Achilles (S1) 0/4  Plantars:  Right: downgoing Left: downgoing  Cerebellar:  Able to perform finger to nose today with cueing. Gait: not tested  CV: pulses palpable throughout    ASSESSMENT [name] is a 80 y.o. female presenting with confusion, inability to follow commands, and inability to communicate. TPA was not given secondary to recent surgery. An MRI revealed an acute left MCA infarct predominantly involving the temporoparietal region. Infarct felt to be secondary to left carotid stenosis. On aspirin 81 mg orally every day prior to admission. Now on aspirin 325 mg orally every day for secondary stroke prevention. Patient with resultant confusion. Work up complete.   Vitrectomy 09/29/2013  Coronary artery disease with CHF  Hypertension  Hyperlipidemia - Pravachol prior to admission - now on Lipitor.  80-90% left internal carotid artery stenosis CT angiogram. Dr. Darrick Penna consulted.  Cervical fracture after falling with surgical repair Dr. Danielle Dess 12/28/2012. Family reports residual left upper extremity weakness.   Hospital day # 2  TREATMENT/PLAN  Continue aspirin 325 mg  orally every day for secondary stroke prevention.  Physical therapist recommends inpatient rehabilitation. Rehabilitation physician consult pending.  Dr. Darrick Penna recommends stent versus carotid endarterectomy in 4-6 weeks.  Stroke workup is complete.  Delton See PA-C [facility] Pager [phone] 10/02/2013, 9:23 AM  I have personally obtained a history, examined the patient, evaluated imaging results, and formulated the assessment and plan of care. I agree with the above.  Lesly Dukes

## 2013-10-02 NOTE — Progress Notes (Addendum)
[facility] PROGRESS NOTE  [name] ZOX:[id number] DOB: May 04, 1931 DOA: 09/30/2013 PCP: [name], MD  Assessment/Plan: #1 acute CVA The MRI of the head with acute left MCA infarct. CT angio of head and neck with 80-90% LICA stenosis. Continue ASA for secondary stroke prevention. 2-D echo with EF 40-45%, with moderate hypokinesis of inferolateral, inferiror, and inferoseptal myocardium. pending. Vascular surgery has been consulted per neurology for evaluation of left internal carotid artery stenosis and recommend stent vs CEA in 4-6 weeks. Neurology is following and appreciate input and recommendations.  #2 left ICA stenosis( 80-90% LICA) Vascular surgery ff and recommend stent vs CEA in 4-6 weeks.  #3 UTI Urine cultures pending. Continue IV Cipro.  #4 status post vitrectomy 09/29/2013 Patient has been seen by ophthalmology and recommend continue all eye drops as ordered and followup one week post discharge as outpatient.  #5 history of CHF Stable. Continue metoprolol.   #6 hypertension Stable. Metoprolol.  #7 anxiety Continue anxiolytics as needed.  #8 hyperlipidemia Continue statin.   Code Status: full Family Communication: updated patient and daughters at bedside. Disposition Plan: CIR versus SNF when medically stable.   Consultants:  Neurology:Dr. Thad Ranger 09/30/2013  Ophthalmology: Dr. Allyne Gee 10/01/2013  Procedures:  MRI/MRA head 10/01/2013  Chest x-ray 10/01/2013  CT angiogram of head and neck 09/30/2013  Antibiotics:  none  HPI/Subjective: Patient states she's feeling better. Expressive aphasia. Confusion improved.  Objective: Filed Vitals:   10/02/13 0926  BP: 156/47  Pulse: 60  Temp: 97.6 F (36.4 C)  Resp: 20    Intake/Output Summary (Last 24 hours) at 10/02/13 1138 Last data filed at 10/02/13 0944  Gross per 24 hour  Intake    120 ml  Output      1 ml  Net    119 ml   Filed Weights   09/30/13 1535   Weight: 77.3 kg (170 lb 6.7 oz)    Exam:   General:  NAD  Cardiovascular: RRR  Respiratory: CTAB  Abdomen: soft, nontender, nondistended, positive bowel sounds.  Musculoskeletal: no clubbing cyanosis or edema  Data Reviewed: Basic Metabolic Panel:  Recent Labs Lab 09/30/13 1102 09/30/13 1111 10/02/13 0555  NA 139 140 139  K 4.3 4.2 4.4  CL 101 101 100  CO2 27  --  28  GLUCOSE 98 99 99  BUN 29* 29* 23  CREATININE 0.82 1.00 0.87  CALCIUM 9.6  --  9.1   Liver Function Tests:  Recent Labs Lab 09/30/13 1102  AST 32  ALT 26  ALKPHOS 137*  BILITOT 0.4  PROT 7.8  ALBUMIN 3.5   No results found for this basename: LIPASE, AMYLASE,  in the last 168 hours No results found for this basename: AMMONIA,  in the last 168 hours CBC:  Recent Labs Lab 09/30/13 1102 09/30/13 1111  WBC 11.7*  --   NEUTROABS 8.3*  --   HGB 10.6* 11.9*  HCT 32.7* 35.0*  MCV 90.8  --   PLT 299  --    Cardiac Enzymes:  Recent Labs Lab 09/30/13 1102  TROPONINI <0.30   BNP (last 3 results) No results found for this basename: PROBNP,  in the last 8760 hours CBG:  Recent Labs Lab 09/30/13 1203  GLUCAP 93    Recent Results (from the past 240 hour(s))  URINE CULTURE     Status: None   Collection Time    09/30/13 12:40 PM      Result Value Range Status   Specimen Description URINE,  CLEAN CATCH   Final   Special Requests NONE   Final   Culture  Setup Time     Final   Value: 09/30/2013 17:09     Performed at [facility] Count     Final   Value: 50,000 COLONIES/ML     Performed at [facility]   Culture     Final   Value: PROTEUS MIRABILIS     Performed at [facility]   Report Status 10/02/2013 FINAL   Final   Organism ID, Bacteria PROTEUS MIRABILIS   Final     Studies: Ct Angio Head W/cm &/or Wo Cm  09/30/2013   CLINICAL DATA:  Right eye surgery 1 day ago for retinal disc abdomen. Developed garbled speech and possible syncopal  episode  EXAM: CT ANGIOGRAPHY HEAD AND NECK  TECHNIQUE: Multidetector CT imaging of the head and neck was performed using the standard protocol during bolus administration of intravenous contrast. Multiplanar CT image reconstructions including MIPs were obtained to evaluate the vascular anatomy. Carotid stenosis measurements (when applicable) are obtained utilizing NASCET criteria, using the distal internal carotid diameter as the denominator.  CONTRAST:  50mL OMNIPAQUE IOHEXOL 350 MG/ML SOLN  COMPARISON:  Noncontrast CT head earlier today.  FINDINGS: CTA HEAD FINDINGS  Moderate cerebral and cerebellar atrophy. No cortical hypodensity. No abnormal postcontrast enhancement. Biparietal thinning. Major dural venous sinuses patent. Postop changes in the right globe.  50% cavernous ICA stenosis on the right. Calcific non stenotic atheromatous change in the cavernous region on the left. Widely patent supraclinoid ICAs bilaterally. Widely patent basilar artery with right vertebral dominant. Left vertebral ends in PICA. No intracranial stenosis or occlusion is evident.  Review of the MIP images confirms the above findings.  CTA NECK FINDINGS  Transverse arch heavily calcified with some mural thrombus proximally. Conventional branching of great vessels from the arch. Moderate atheromatous change at the right and left subclavian origin. Right vertebral dominant with left vertebral hypoplastic.  Heavily calcified plaque at the right carotid bifurcation extending into the proximal right internal carotid artery. Ratio of 2.9/ 4.6 proximal/distal is consistent with a less than 50% stenosis. No ulceration or soft plaque. No evidence for dissection.  Extensive heavily calcified left carotid bifurcation with severe luminal narrowing. Ratio of 0.8/5.2 proximal/distal consistent with a 80-90% stenosis. No ulceration or soft plaque. No evidence for dissection.  The patient is status post C3- C6 posterior fusion with laminectomy.  Moderate anterolisthesis T1 on T2 is degenerative in nature. There is healing of the previously identified C5 fracture with restoration of normal alignment. Instrumentation grossly intact. There is fusion at least across the C3-4 interspace. There is nonunion of a type 2 odontoid fracture with mild canal stenosis on the right but no apparent vertebral artery compromise. No lung apex lesion. No neck masses. No osseous destructive lesion although skeletal osteopenia is noted.  Review of the MIP images confirms the above findings.  IMPRESSION: 80-90% heavily calcified left internal carotid artery stenosis. This is potentially flow reducing.  No intracranial stenosis, occlusion, or visible acute infarct.  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 No apparent vertebral artery compromise.  Previous C3 through C6 fusion with healing of C5 fracture.  Vascular Findings reviewed with ordering provider.   Electronically Signed   By: Davonna Belling M.D.   On: 09/30/2013 12:47   Dg Chest 2 View  10/01/2013   CLINICAL DATA:  Stroke symptoms  EXAM: CHEST  2 VIEW  COMPARISON:  PA and lateral chest x-ray dated March 19, 2008.  FINDINGS: The lungs are mildly hypoinflated. There are increased lung markings at both lung bases consistent with atelectasis or pneumonia. The hemidiaphragms remain visible but there is blunting of the costophrenic angles consistent with small bilateral pleural effusions. The cardiopericardial silhouette is enlarged. The central pulmonary vascularity is engorged. There is chronic deformity of the midshaft of the right clavicle.  IMPRESSION: 1. New bibasilar increased density is consistent with atelectasis or pneumonia. There are small bilateral pleural effusions. 2. There is enlargement of the cardiac silhouette with prominence of the central pulmonary vascularity consistent with low-grade CHF.   Electronically Signed   By: David  Swazil[place]   On: 10/01/2013 09:20   Ct Angio Neck  W/cm &/or Wo/cm  09/30/2013   CLINICAL DATA:  Right eye surgery 1 day ago for retinal disc abdomen. Developed garbled speech and possible syncopal episode  EXAM: CT ANGIOGRAPHY HEAD AND NECK  TECHNIQUE: Multidetector CT imaging of the head and neck was performed using the standard protocol during bolus administration of intravenous contrast. Multiplanar CT image reconstructions including MIPs were obtained to evaluate the vascular anatomy. Carotid stenosis measurements (when applicable) are obtained utilizing NASCET criteria, using the distal internal carotid diameter as the denominator.  CONTRAST:  50mL OMNIPAQUE IOHEXOL 350 MG/ML SOLN  COMPARISON:  Noncontrast CT head earlier today.  FINDINGS: CTA HEAD FINDINGS  Moderate cerebral and cerebellar atrophy. No cortical hypodensity. No abnormal postcontrast enhancement. Biparietal thinning. Major dural venous sinuses patent. Postop changes in the right globe.  50% cavernous ICA stenosis on the right. Calcific non stenotic atheromatous change in the cavernous region on the left. Widely patent supraclinoid ICAs bilaterally. Widely patent basilar artery with right vertebral dominant. Left vertebral ends in PICA. No intracranial stenosis or occlusion is evident.  Review of the MIP images confirms the above findings.  CTA NECK FINDINGS  Transverse arch heavily calcified with some mural thrombus proximally. Conventional branching of great vessels from the arch. Moderate atheromatous change at the right and left subclavian origin. Right vertebral dominant with left vertebral hypoplastic.  Heavily calcified plaque at the right carotid bifurcation extending into the proximal right internal carotid artery. Ratio of 2.9/ 4.6 proximal/distal is consistent with a less than 50% stenosis. No ulceration or soft plaque. No evidence for dissection.  Extensive heavily calcified left carotid bifurcation with severe luminal narrowing. Ratio of 0.8/5.2 proximal/distal consistent with a  80-90% stenosis. No ulceration or soft plaque. No evidence for dissection.  The patient is status post C3- C6 posterior fusion with laminectomy. Moderate anterolisthesis T1 on T2 is degenerative in nature. There is healing of the previously identified C5 fracture with restoration of normal alignment. Instrumentation grossly intact. There is fusion at least across the C3-4 interspace. There is nonunion of a type 2 odontoid fracture with mild canal stenosis on the right but no apparent vertebral artery compromise. No lung apex lesion. No neck masses. No osseous destructive lesion although skeletal osteopenia is noted.  Review of the MIP images confirms the above findings.  IMPRESSION: 80-90% heavily calcified left internal carotid artery stenosis. This is potentially flow reducing.  No intracranial stenosis, occlusion, or visible acute infarct.  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 No apparent vertebral artery compromise.  Previous C3 through C6 fusion with healing of C5 fracture.  Vascular Findings reviewed with ordering provider.   Electronically Signed   By: Unice BaileyJohn  Curnes M.D.  On: 09/30/2013 12:47   Mr Brain Wo Contrast  10/01/2013   CLINICAL DATA:  Acute onset mental status changes and difficulty speaking.  EXAM: MRI HEAD WITHOUT CONTRAST  MRA HEAD WITHOUT CONTRAST  TECHNIQUE: Multiplanar, multiecho pulse sequences of the brain and surrounding structures were obtained without intravenous contrast. Angiographic images of the head were obtained using MRA technique without contrast.  COMPARISON:  Head CT and CTA 09/30/2013  FINDINGS: MRI HEAD FINDINGS  Limited visualization of the upper cervical spine demonstrate nonunited dens fracture, better seen on recent CT. Incidental note is made of a partially empty sella. Restricted diffusion is present in cortical and subcortical locations in the left temporoparietal region as well as posterior left insula, consistent with acute  infarcts. There is mild edema within these regions of infarct without significant mass effect. There is also a punctate focus of restricted diffusion in left frontal lobe cortex more superiorly. There is no evidence of intracranial hemorrhage. There is mild-to-moderate generalized cerebral atrophy. Scattered, small foci of T2 hyperintensity within the subcortical and deep cerebral white matter bilaterally as well as within the pons are nonspecific but compatible with mild to moderate chronic small vessel ischemic disease. There is no midline shift or extra-axial fluid collection. Major intracranial vascular flow voids are unremarkable. Abnormal signal within the right globe is likely related to recent surgery. The left maxillary sinus is small.  MRA HEAD FINDINGS  The visualized distal vertebral arteries are patent. The right vertebral artery is dominant the vertebral artery ending in PICA. Right PICA is patent. Basilar artery is patent. SCAs are patent. There is a fetal origin of the left PCA. PCAs are otherwise unremarkable. Internal carotid arteries are patent from skullbase to carotid terminus. Mild right greater than left cavernous carotid atherosclerosis is again seen, better demonstrated on recent CTA. ACA and MCA origins and visualized branches are patent and unremarkable. There is a patent anterior communicating artery. No intracranial aneurysm is identified.  IMPRESSION: 1. Acute left MCA infarct predominantly involving the temporoparietal region. 2. No evidence of major intracranial arterial occlusion or flow-limiting stenosis. These results will be called to the ordering clinician or representative by the Radiologist Assistant, and communication documented in the PACS Dashboard.   Electronically Signed   By: Sebastian Ache   On: 10/01/2013 09:22   Mr Maxine Glenn Head/brain Wo Cm  10/01/2013   CLINICAL DATA:  Acute onset mental status changes and difficulty speaking.  EXAM: MRI HEAD WITHOUT CONTRAST  MRA HEAD  WITHOUT CONTRAST  TECHNIQUE: Multiplanar, multiecho pulse sequences of the brain and surrounding structures were obtained without intravenous contrast. Angiographic images of the head were obtained using MRA technique without contrast.  COMPARISON:  Head CT and CTA 09/30/2013  FINDINGS: MRI HEAD FINDINGS  Limited visualization of the upper cervical spine demonstrate nonunited dens fracture, better seen on recent CT. Incidental note is made of a partially empty sella. Restricted diffusion is present in cortical and subcortical locations in the left temporoparietal region as well as posterior left insula, consistent with acute infarcts. There is mild edema within these regions of infarct without significant mass effect. There is also a punctate focus of restricted diffusion in left frontal lobe cortex more superiorly. There is no evidence of intracranial hemorrhage. There is mild-to-moderate generalized cerebral atrophy. Scattered, small foci of T2 hyperintensity within the subcortical and deep cerebral white matter bilaterally as well as within the pons are nonspecific but compatible with mild to moderate chronic small vessel ischemic disease. There is no  midline shift or extra-axial fluid collection. Major intracranial vascular flow voids are unremarkable. Abnormal signal within the right globe is likely related to recent surgery. The left maxillary sinus is small.  MRA HEAD FINDINGS  The visualized distal vertebral arteries are patent. The right vertebral artery is dominant the vertebral artery ending in PICA. Right PICA is patent. Basilar artery is patent. SCAs are patent. There is a fetal origin of the left PCA. PCAs are otherwise unremarkable. Internal carotid arteries are patent from skullbase to carotid terminus. Mild right greater than left cavernous carotid atherosclerosis is again seen, better demonstrated on recent CTA. ACA and MCA origins and visualized branches are patent and unremarkable. There is a  patent anterior communicating artery. No intracranial aneurysm is identified.  IMPRESSION: 1. Acute left MCA infarct predominantly involving the temporoparietal region. 2. No evidence of major intracranial arterial occlusion or flow-limiting stenosis. These results will be called to the ordering clinician or representative by the Radiologist Assistant, and communication documented in the PACS Dashboard.   Electronically Signed   By: Sebastian Ache   On: 10/01/2013 09:22    Scheduled Meds: . aspirin  300 mg Rectal Daily   Or  . aspirin  325 mg Oral Daily  . atorvastatin  40 mg Oral q1800  . ciprofloxacin  1 drop Right Eye QID  . ciprofloxacin  400 mg Intravenous Q12H  . dorzolamide-timolol  1 drop Right Eye BID  . enoxaparin (LOVENOX) injection  40 mg Subcutaneous Q24H  . metoprolol  50 mg Oral BID  . pantoprazole  40 mg Oral Daily  . pramipexole  0.25 mg Oral QHS  . prednisoLONE acetate  1 drop Right Eye QID   Continuous Infusions:   Principal Problem:   CVA (cerebral infarction) Active Problems:   Coronary artery disease   Hypertension   Anemia of other chronic disease   Slurred speech   UTI (urinary tract infection)   Occlusion and stenosis of carotid artery with cerebral infarction    Time spent: 35 minutes    THOMPSON,DANIEL M.D. [facility] Pager [phone]. If 7PM-7AM, please contact night-coverage at [email or web address], password [facility] 10/02/2013, 11:38 AM  LOS: 2 days    Addendum: Spoke with Dr. Anne Fu of [facility] cardiology who looked up records from the [place] office of prior 2-D echo done on the patient. Records indicate that on 09/09/2012 patient had a 2-D echo done with a normal ejection fraction mild LVH and mild TR/MR. Also mild dilated left atrium. Patient also noted to have a cardiac catheterization in 2008 that showed a 95% RCA stenosis as well as a 30-40% LAD stenosis which was managed medically.  Patient during this hospitalization had a 2-D echo that shows a  EF of 40-45% with moderate hypokinesis of the inferolateral, inferior, inferioroseptal myocardium which is a change from prior 2-D echo. Will consult with cardiology for further evaluation and management.

## 2013-10-02 NOTE — Consult Note (Signed)
Cardiology Consult Note  Admit date: 09/30/2013 Name: [name] 80 y.o.  female DOB:  09-Feb-1931 MRN:  [id number]  Today's date:  10/02/2013  Referring Physician: Dr. Ramiro Harvest  Primary Physician:  Dr. Maurice Small  Reason for Consultation:  Abnormal echocardiogram  IMPRESSIONS: 1. Recent large parietal stroke with resulting aphasia 2. Severe symptomatic left carotid artery disease 3. Previous cervical spinal fracture with residual disability 4. Coronary artery disease with catheterization many years ago 5. Abnormal echocardiogram with inferolateral hypokinesis reportedly new by echo but no old echo is available for review. She has not had a clinical infarction and does not have Q waves on her EKG. 6. Anemia 7. Hyperlipidemia  RECOMMENDATION: She has had a significant stroke and is currently in the process of recovering from this. She has mild to moderately depressed left ventricular ejection fraction but has no cardiac symptoms.  1. Initiate low-dose ACE inhibition 2. Continue beta blocker therapy 3. Continue aspirin 4. Obtain BNP level 5. She does not have clinical heart failure and her prominent symptoms are neurologic. We will follow along with you and give further recommendations depending on the results of the BNP level and her clinical course as to her suitability for potential surgery if needed.   HISTORY: This 80 year old female has a history of hypertension and hyperlipidemia. She evidently had coronary artery disease noted at catheterization many years ago by Dr. Juanda Chance ( ?1996 by review of old record) that reportedly showed mild coronary disease. She has not had clinical angina. She was admitted with significant expressive a fascia was found to have a large parietal stroke asymptomatic left carotid artery stenosis. As part of her workup she had an echocardiogram that showed inferolateral hypokinesis with moderate rejection LV ejection fraction. She is  completely asymptomatic from this and I was asked to consult. She denies angina and has no PND orthopnea. She does not currently have edema. She does according to the chart have a history of diastolic congestive heart failure in the past.  Past Medical History  Diagnosis Date  . CHF (congestive heart failure)   . Coronary artery disease   . Hypertension   . Arthritis   . Edema of extremities   . Anxiety   . HLD (hyperlipidemia)       Past Surgical History  Procedure Laterality Date  . Total knee arthroplasty Bilateral     knee replacements  . Lumbar disc surgery    . Cholecystectomy    . Posterior cervical fusion/foraminotomy N/A 12/28/2012    Procedure: POSTERIOR CERVICAL FUSION/FORAMINOTOMY LEVEL 3;  Surgeon: Barnett Abu, MD;  Location: [facility];  Service: Neurosurgery;  Laterality: N/A;  Posterior Cervical Three-Six Laminectomy and Fusion  . Angioplasty    . Tonsillectomy      Allergies:  is allergic to penicillins.   Medications: Prior to Admission medications   Medication Sig Start Date End Date Taking? Authorizing Provider  ALPRAZolam Prudy Feeler) 0.25 MG tablet Take 1 tablet every night at bedtime for anxiety 01/21/13  Yes Lucrezia Starch, NP  aspirin EC 81 MG tablet Take 81 mg by mouth daily.   Yes Historical Provider, MD  HYDROcodone-acetaminophen (NORCO) 10-325 MG per tablet Take 1 tablets every 6 hours as needed for mild pain(1-4) Take 2 tablets every 6 hours as needed for moderate to severe pain(5-10). 03/21/13  Yes Tiffany L Reed, DO  methocarbamol (ROBAXIN) 500 MG tablet Take 1 tablet (500 mg total) by mouth every 6 (six) hours as needed. 01/02/13  Yes  Meredeth IdeGagan S Lama, MD  metoprolol (LOPRESSOR) 50 MG tablet Take 50 mg by mouth 2 (two) times daily.   Yes Historical Provider, MD  Multiple Vitamin (MULTIVITAMIN WITH MINERALS) TABS Take 1 tablet by mouth daily.   Yes Historical Provider, MD  pramipexole (MIRAPEX) 0.125 MG tablet Take 0.25 mg by mouth at bedtime.   Yes Historical  Provider, MD  pravastatin (PRAVACHOL) 80 MG tablet Take 80 mg by mouth daily at 6 PM.   Yes Historical Provider, MD    Family History: Family Status  Relation Status Death Age  . Mother Deceased   . Father Deceased   . Sister Deceased   . Brother Deceased     Social History:   reports that she has never smoked. She has never used smokeless tobacco. She reports that she drinks alcohol. She reports that she does not use illicit drugs.   History   Social History Narrative  . No narrative on file    Review of Systems: Difficult to obtain during a fascia but doesn't complain of shortness of breath or chest pain.  Physical Exam: BP 135/50  Pulse 58  Temp(Src) 97.6 F (36.4 C) (Oral)  Resp 20  Ht 5\' 3"  (1.6 m)  Wt 77.3 kg (170 lb 6.7 oz)  BMI 30.20 kg/m2  SpO2 100%  General appearance: Pleasant elderly female who is mildly aphasic Head: Normocephalic, without obvious abnormality, atraumatic Eyes: conjunctivae/corneas clear. PERRL, EOM's intact. Fundi benign. Neck: no adenopathy, no JVD, supple, symmetrical, trachea midline and Left carotid bruit noted Lungs: clear to auscultation bilaterally Heart: regular rate and rhythm, S1, S2 normal, no murmur, click, rub or gallop Abdomen: soft, non-tender; bowel sounds normal; no masses,  no organomegaly Pelvic: deferred Extremities: extremities normal, atraumatic, no cyanosis or edema Pulses: 2+ femorals, dorsalis pedis posterior tibials are diminished. Neurologic: Grossly normal   Labs: CBC  Recent Labs  09/30/13 1102 09/30/13 1111  WBC 11.7*  --   RBC 3.60*  --   HGB 10.6* 11.9*  HCT 32.7* 35.0*  PLT 299  --   MCV 90.8  --   MCH 29.4  --   MCHC 32.4  --   RDW 15.7*  --   LYMPHSABS 2.6  --   MONOABS 0.7  --   EOSABS 0.1  --   BASOSABS 0.1  --    CMP   Recent Labs  09/30/13 1102  10/02/13 0555  NA 139  < > 139  K 4.3  < > 4.4  CL 101  < > 100  CO2 27  --  28  GLUCOSE 98  < > 99  BUN 29*  < > 23   CREATININE 0.82  < > 0.87  CALCIUM 9.6  --  9.1  PROT 7.8  --   --   ALBUMIN 3.5  --   --   AST 32  --   --   ALT 26  --   --   ALKPHOS 137*  --   --   BILITOT 0.4  --   --   GFRNONAA 66*  --  61*  GFRAA 76*  --  71*  < > = values in this interval not displayed.  Cardiac Panel (last 3 results)  Recent Labs  09/30/13 1102  TROPONINI <0.30     Radiology: Bibasilar density, pleural effusions small  EKG: Normal sinus rhythm, no acute changes  Signed:  W. Ashley RoyaltySpencer Levert Heslop, Jr. MD [facility]ACC   Cardiology Consultant  10/02/2013, 3:47 PM

## 2013-10-03 ENCOUNTER — Telehealth: Payer: Self-pay | Admitting: Vascular Surgery

## 2013-10-03 ENCOUNTER — Other Ambulatory Visit: Payer: Self-pay | Admitting: *Deleted

## 2013-10-03 DIAGNOSIS — I63239 Cerebral infarction due to unspecified occlusion or stenosis of unspecified carotid arteries: Secondary | ICD-10-CM

## 2013-10-03 DIAGNOSIS — R9389 Abnormal findings on diagnostic imaging of other specified body structures: Secondary | ICD-10-CM

## 2013-10-03 DIAGNOSIS — I6529 Occlusion and stenosis of unspecified carotid artery: Secondary | ICD-10-CM

## 2013-10-03 LAB — BASIC METABOLIC PANEL
BUN: 20 mg/dL (ref 6–23)
CO2: 30 mEq/L (ref 19–32)
CREATININE: 0.79 mg/dL (ref 0.50–1.10)
Calcium: 9.1 mg/dL (ref 8.4–10.5)
Chloride: 100 mEq/L (ref 96–112)
GFR calc Af Amer: 89 mL/min — ABNORMAL LOW (ref >90)
GFR, EST NON AFRICAN AMERICAN: 77 mL/min — AB (ref >90)
GLUCOSE: 85 mg/dL (ref 70–99)
POTASSIUM: 4.2 meq/L (ref 3.7–5.3)
Sodium: 138 mEq/L (ref 137–147)

## 2013-10-03 MED ORDER — FUROSEMIDE 20 MG PO TABS
20.0000 mg | ORAL_TABLET | Freq: Every day | ORAL | Status: DC
  Administered 2013-10-03 – 2013-10-06 (×4): 20 mg via ORAL
  Filled 2013-10-03 (×4): qty 1

## 2013-10-03 NOTE — Consult Note (Signed)
Physical Medicine and Rehabilitation Consult  Reason for Consult: Difficulty talking,  Referring Physician:  Dr. Janee Mornhompson.    HPI: [name] is a 80 y.o. RH-female with history of CHF, CAD, C2 dens fracture with left sided weakness; who underwent vitrectomy on 09/29/13 and on follow up in office next day developed acute onset of confusion and inability to talk. CT head without acute changes. CTA head/neck with 80-90% heavily calcified left internal carotid artery stenosis with  potentially flow reduction and nNo intracranial stenosis, occlusion, or visible acute infarct. MRI brain done revealing acute left MCA infarct predominantly involving the temporoparietal region. 2D echo with EF 40-45% with moderate hypokinesis, grade 2 diastolic dysfunction and mild to moderate MVR. Dr Darrick PennaFields consulted and recommends stent v/s CEA in 4-6 weeks for symptomatic carotid stenosis. Dr. Donnie Ahoilley consulted for input on echo and recommended medical management as patient without clinical HF. Neurology recommended increasing ASA to 325 mg daily for stroke due to L-CAS. WOC consulted for input on stage III cocygeal ulcer and calcium alginate ordered for treatment.    Review of Systems  Eyes: Positive for blurred vision (right eye).  Gastrointestinal: Negative for heartburn and nausea.  Musculoskeletal: Positive for myalgias.  Neurological: Positive for speech change and focal weakness (LUE weakness. ). Negative for headaches.    Past Medical History  Diagnosis Date  . CHF (congestive heart failure)   . Coronary artery disease   . Hypertension   . Arthritis   . Edema of extremities   . Anxiety   . HLD (hyperlipidemia)    Past Surgical History  Procedure Laterality Date  . Total knee arthroplasty Bilateral     knee replacements  . Lumbar disc surgery    . Cholecystectomy    . Posterior cervical fusion/foraminotomy N/A 12/28/2012    Procedure: POSTERIOR CERVICAL FUSION/FORAMINOTOMY LEVEL 3;  Surgeon:  Barnett AbuHenry Elsner, MD;  Location: [facility];  Service: Neurosurgery;  Laterality: N/A;  Posterior Cervical Three-Six Laminectomy and Fusion  . Angioplasty    . Tonsillectomy      Family History  Problem Relation Age of Onset  . Dementia Sister     x 3  . CAD Brother   . Breast cancer Sister     x 3  . Heart disease Sister   . Heart disease Son     x 2  . Alzheimer's disease Sister     Social History:  Has 24 hours sitters to help with care. Limited mobility-used walker and wheelchair. She reports that she has never smoked. She has never used smokeless tobacco. She reports that she drinks alcohol. She reports that she does not use illicit drugs.   Allergies  Allergen Reactions  . Penicillins Shortness Of Breath    Throat closed up and had hives and nausea    Medications Prior to Admission  Medication Sig Dispense Refill  . ALPRAZolam (XANAX) 0.25 MG tablet Take 1 tablet every night at bedtime for anxiety  30 tablet  5  . aspirin EC 81 MG tablet Take 81 mg by mouth daily.      Marland Kitchen. HYDROcodone-acetaminophen (NORCO) 10-325 MG per tablet Take 1 tablets every 6 hours as needed for mild pain(1-4) Take 2 tablets every 6 hours as needed for moderate to severe pain(5-10).  240 tablet  5  . methocarbamol (ROBAXIN) 500 MG tablet Take 1 tablet (500 mg total) by mouth every 6 (six) hours as needed.  30 tablet  0  . metoprolol (LOPRESSOR) 50 MG  tablet Take 50 mg by mouth 2 (two) times daily.      . Multiple Vitamin (MULTIVITAMIN WITH MINERALS) TABS Take 1 tablet by mouth daily.      . pramipexole (MIRAPEX) 0.125 MG tablet Take 0.25 mg by mouth at bedtime.      . pravastatin (PRAVACHOL) 80 MG tablet Take 80 mg by mouth daily at 6 PM.        Home: Home Living Family/patient expects to be discharged to:: Inpatient rehab Living Arrangements: Children Available Help at Discharge: Available 24 hours/day;Family;Home health Type of Home: House Home Access: Ramped entrance Home Layout: One  level Home Equipment: Walker - 2 wheels;Shower seat;Wheelchair - [employer]  Lives With: Family;Other (Comment) (hired caregiver )  Functional History:   Functional Status:  Mobility:          ADL:    Cognition: Cognition Overall Cognitive Status: Within Functional Limits for tasks assessed Arousal/Alertness: Awake/alert Orientation Level: Oriented to person;Oriented to place;Oriented to situation Attention: Selective Selective Attention: Appears intact Memory: Appears intact Awareness: Appears intact Problem Solving: Appears intact Executive Function: Reasoning;Decision Making Reasoning: Appears intact Decision Making: Appears intact Safety/Judgment: Appears intact Cognition Arousal/Alertness: Awake/alert Behavior During Therapy: Anxious Overall Cognitive Status: Within Functional Limits for tasks assessed Difficult to assess due to: Impaired communication  Blood pressure 163/68, pulse 65, temperature 98.5 F (36.9 C), temperature source Oral, resp. rate 20, height 5\' 3"  (1.6 m), weight 77.3 kg (170 lb 6.7 oz), SpO2 100.00%. Physical Exam  Nursing note and vitals reviewed. Constitutional: She is oriented to person, place, and time. She appears well-developed and well-nourished.  HENT:  Head: Normocephalic and atraumatic.  Neck: Normal range of motion.  Cardiovascular: Normal rate and regular rhythm.   No murmur heard. Respiratory: Effort normal and breath sounds normal. No respiratory distress. She has no wheezes.  GI: Soft. Bowel sounds are normal. She exhibits no distension. There is no tenderness.  Musculoskeletal:  Bilateral thenar atrophy. Decrease in fine motor movements left side.   Neurological: She is alert and oriented to person, place, and time. A cranial nerve deficit is present.  Expressive deficits but occasional spontaneous outburst with cues.  Apraxic. Unable to follow two step commands without max cues. RIght limb ataxia and mild  weakness, 3+ to 4-/5. RUE more involved than lower. (inconsistent). Decreased awareness and insight.   Skin: Skin is warm and dry.  Psychiatric: She has a normal mood and affect. Her behavior is normal.    Results for orders placed during the hospital encounter of 09/30/13 (from the past 24 hour(s))  BASIC METABOLIC PANEL     Status: Abnormal   Collection Time    10/03/13  4:19 AM      Result Value Range   Sodium 138  137 - 147 mEq/L   Potassium 4.2  3.7 - 5.3 mEq/L   Chloride 100  96 - 112 mEq/L   CO2 30  19 - 32 mEq/L   Glucose, Bld 85  70 - 99 mg/dL   BUN 20  6 - 23 mg/dL   Creatinine, Ser 6.96  0.50 - 1.10 mg/dL   Calcium 9.1  8.4 - 29.5 mg/dL   GFR calc non Af Amer 77 (*) >90 mL/min   GFR calc Af Amer 89 (*) >90 mL/min   Mr Brain Wo Contrast  10/01/2013   CLINICAL DATA:  Acute onset mental status changes and difficulty speaking.  EXAM: MRI HEAD WITHOUT CONTRAST  MRA HEAD WITHOUT CONTRAST  TECHNIQUE: Multiplanar,  multiecho pulse sequences of the brain and surrounding structures were obtained without intravenous contrast. Angiographic images of the head were obtained using MRA technique without contrast.  COMPARISON:  Head CT and CTA 09/30/2013  FINDINGS: MRI HEAD FINDINGS  Limited visualization of the upper cervical spine demonstrate nonunited dens fracture, better seen on recent CT. Incidental note is made of a partially empty sella. Restricted diffusion is present in cortical and subcortical locations in the left temporoparietal region as well as posterior left insula, consistent with acute infarcts. There is mild edema within these regions of infarct without significant mass effect. There is also a punctate focus of restricted diffusion in left frontal lobe cortex more superiorly. There is no evidence of intracranial hemorrhage. There is mild-to-moderate generalized cerebral atrophy. Scattered, small foci of T2 hyperintensity within the subcortical and deep cerebral white matter  bilaterally as well as within the pons are nonspecific but compatible with mild to moderate chronic small vessel ischemic disease. There is no midline shift or extra-axial fluid collection. Major intracranial vascular flow voids are unremarkable. Abnormal signal within the right globe is likely related to recent surgery. The left maxillary sinus is small.  MRA HEAD FINDINGS  The visualized distal vertebral arteries are patent. The right vertebral artery is dominant the vertebral artery ending in PICA. Right PICA is patent. Basilar artery is patent. SCAs are patent. There is a fetal origin of the left PCA. PCAs are otherwise unremarkable. Internal carotid arteries are patent from skullbase to carotid terminus. Mild right greater than left cavernous carotid atherosclerosis is again seen, better demonstrated on recent CTA. ACA and MCA origins and visualized branches are patent and unremarkable. There is a patent anterior communicating artery. No intracranial aneurysm is identified.  IMPRESSION: 1. Acute left MCA infarct predominantly involving the temporoparietal region. 2. No evidence of major intracranial arterial occlusion or flow-limiting stenosis. These results will be called to the ordering clinician or representative by the Radiologist Assistant, and communication documented in the PACS Dashboard.   Electronically Signed   By: Sebastian Ache   On: 10/01/2013 09:22   Mr Maxine Glenn Head/brain Wo Cm  10/01/2013   CLINICAL DATA:  Acute onset mental status changes and difficulty speaking.  EXAM: MRI HEAD WITHOUT CONTRAST  MRA HEAD WITHOUT CONTRAST  TECHNIQUE: Multiplanar, multiecho pulse sequences of the brain and surrounding structures were obtained without intravenous contrast. Angiographic images of the head were obtained using MRA technique without contrast.  COMPARISON:  Head CT and CTA 09/30/2013  FINDINGS: MRI HEAD FINDINGS  Limited visualization of the upper cervical spine demonstrate nonunited dens fracture,  better seen on recent CT. Incidental note is made of a partially empty sella. Restricted diffusion is present in cortical and subcortical locations in the left temporoparietal region as well as posterior left insula, consistent with acute infarcts. There is mild edema within these regions of infarct without significant mass effect. There is also a punctate focus of restricted diffusion in left frontal lobe cortex more superiorly. There is no evidence of intracranial hemorrhage. There is mild-to-moderate generalized cerebral atrophy. Scattered, small foci of T2 hyperintensity within the subcortical and deep cerebral white matter bilaterally as well as within the pons are nonspecific but compatible with mild to moderate chronic small vessel ischemic disease. There is no midline shift or extra-axial fluid collection. Major intracranial vascular flow voids are unremarkable. Abnormal signal within the right globe is likely related to recent surgery. The left maxillary sinus is small.  MRA HEAD FINDINGS  The visualized  distal vertebral arteries are patent. The right vertebral artery is dominant the vertebral artery ending in PICA. Right PICA is patent. Basilar artery is patent. SCAs are patent. There is a fetal origin of the left PCA. PCAs are otherwise unremarkable. Internal carotid arteries are patent from skullbase to carotid terminus. Mild right greater than left cavernous carotid atherosclerosis is again seen, better demonstrated on recent CTA. ACA and MCA origins and visualized branches are patent and unremarkable. There is a patent anterior communicating artery. No intracranial aneurysm is identified.  IMPRESSION: 1. Acute left MCA infarct predominantly involving the temporoparietal region. 2. No evidence of major intracranial arterial occlusion or flow-limiting stenosis. These results will be called to the ordering clinician or representative by the Radiologist Assistant, and communication documented in the PACS  Dashboard.   Electronically Signed   By: Sebastian Ache   On: 10/01/2013 09:22    Assessment/Plan: Diagnosis: left temporal-parietal infarct 1. Does the need for close, 24 hr/day medical supervision in concert with the patient's rehab needs make it unreasonable for this patient to be served in a less intensive setting? Yes 2. Co-Morbidities requiring supervision/potential complications: cad, htn, osteoporosis 3. Due to bladder management, bowel management, safety, skin/wound care, disease management, medication administration, pain management and patient education, does the patient require 24 hr/day rehab nursing? Yes 4. Does the patient require coordinated care of a physician, rehab nurse, PT (1-2 hrs/day, 5 days/week), OT (1-2 hrs/day, 5 days/week) and SLP (1-2 hrs/day, 5 days/week) to address physical and functional deficits in the context of the above medical diagnosis(es)? Yes Addressing deficits in the following areas: balance, endurance, locomotion, strength, transferring, bowel/bladder control, bathing, dressing, feeding, grooming, toileting, cognition, speech and psychosocial support 5. Can the patient actively participate in an intensive therapy program of at least 3 hrs of therapy per day at least 5 days per week? Yes 6. The potential for patient to make measurable gains while on inpatient rehab is good 7. Anticipated functional outcomes upon discharge from inpatient rehab are min assist with PT, min to mod assist with OT, supervision with SLP. 8. Estimated rehab length of stay to reach the above functional goals is: 13-20 days 9. Does the patient have adequate social supports to accommodate these discharge functional goals? Yes 10. Anticipated D/C setting: Home 11. Anticipated post D/C treatments: HH therapy 12. Overall Rehab/Functional Prognosis: good  RECOMMENDATIONS: This patient's condition is appropriate for continued rehabilitative care in the following setting: CIR Patient has  agreed to participate in recommended program. Yes Note that insurance prior authorization may be required for reimbursement for recommended care.  Comment: Rehab Admissions Coordinator to follow up.  Thanks,  Ranelle Oyster, MD, [place] [facility]     10/03/2013

## 2013-10-03 NOTE — Progress Notes (Signed)
Rehab admissions - I met with pt and son, Stormy Card and they are interested in pursuing inpatient acute rehab. I gave informational brochures about the rehab unit. Noted that pt has [employer] and will begin to work on [employer]. I have faxed clinicals to [facility]. Please call me with any questions. Thanks,  Nanetta Batty, [facility] Admissions Coordinator [phone]

## 2013-10-03 NOTE — Progress Notes (Signed)
Physical Therapy Treatment Patient Details Name: Toni PartridgeMayfie A Friddle MRN: [id number] DOB: 22-May-1931 Today's Date: 10/03/2013 Time: 1011-1023 PT Time Calculation (min): 12 min  PT Assessment / Plan / Recommendation  History of Present Illness Patient is a 80 y.o. year old female who presents for evaluation of symptomatic left internal carotid stenosis. Pt with aphasia after recent eye procedure.  Some improvement overnight but still not back to baseline.  No obvious motor deficits but has some mild left upper extremity weakness baseline from prior Cspine fracture last spring.    PT Comments   Patient demonstrates some improvements in functional mobility today. Patient required min assist for ambulation and transfers. Feel patient will benefit from continued Skilled PT at d/c prior to home.    Follow Up Recommendations  CIR;Supervision/Assistance - 24 hour           Equipment Recommendations  None recommended by PT    Recommendations for Other Services Rehab consult  Frequency Min 4X/week   Progress towards PT Goals Progress towards PT goals  Plan  Current plan remains appropriate  Precautions / Restrictions Precautions Precautions: Fall Precaution Comments: Patient fearful of falling in standing Restrictions Weight Bearing Restrictions: No   Pertinent Vitals/Pain Patient has some pain at this time, no value given    Mobility  Bed Mobility Overal bed mobility: Needs Assistance Bed Mobility: Supine to Sit Supine to sit: Min assist Transfers Overall transfer level: Needs assistance Equipment used: Rolling walker (2 wheeled) Transfers: Sit to/from Stand Sit to Stand: Min assist General transfer comment: VCs for hand placement and upright posture pt with preflexed fwd head posture) assist for stability to come to upright, VCs for hand placement Ambulation/Gait Ambulation/Gait assistance: Min assist Ambulation Distance (Feet): 26 Feet Assistive device: Rolling walker (2  wheeled) Gait Pattern/deviations: Step-to pattern;Decreased stride length;Shuffle;Trunk flexed Gait velocity: decreased Gait velocity interpretation: <1.8 ft/sec, indicative of risk for recurrent falls General Gait Details: very slow with gait      PT Goals (current goals can now be found in the care plan section) Acute Rehab PT Goals Patient Stated Goal: To be able to go home PT Goal Formulation: With patient/family Time For Goal Achievement: 10/15/13 Potential to Achieve Goals: Good  Visit Information  Last PT Received On: 10/03/13 Assistance Needed: +1 History of Present Illness: Patient is a 80 y.o. year old female who presents for evaluation of symptomatic left internal carotid stenosis. Pt with aphasia after recent eye procedure.  Some improvement overnight but still not back to baseline.  No obvious motor deficits but has some mild left upper extremity weakness baseline from prior Cspine fracture last spring.     Subjective Data  Subjective: I would like to get out of bed Patient Stated Goal: To be able to go home   Cognition  Cognition Arousal/Alertness: Awake/alert Behavior During Therapy: WFL for tasks assessed/performed Overall Cognitive Status: Difficult to assess Difficult to assess due to:  (communication)    Balance  Balance Overall balance assessment: Needs assistance Sitting balance-Leahy Scale: Good Standing balance-Leahy Scale: Fair  End of Session PT - End of Session Equipment Utilized During Treatment: Gait belt Activity Tolerance: Patient limited by fatigue;Other (comment) (about to work with OT) Patient left: in chair;with call bell/phone within reach;Other (comment) (with OT) Nurse Communication: Mobility status   GP     [name], [name] 10/03/2013, 12:01 PM [name], PT DPT  [phone]

## 2013-10-03 NOTE — Telephone Encounter (Addendum)
Message copied by Fredrich BirksMILLIKAN, DANA P on Mon Oct 03, 2013  2:03 PM ------      Message from: Sherren KernsFIELDS, CHARLES E      Created: Sat Oct 01, 2013  3:48 PM       Level 4 consult for symptomatic left carotid stenosis.            She needs follow up appt with me in 4-6 weeks      Carotid duplex at that time.            For appt call her daughter Samul Dadaeggy Waye [phone]            [facility] ------She also needs lower extremity arterial duplex for PAD weakness in legs and assess for access for possible carotid stent    10/03/13: spoke with pts daughter [name] to schedule, dpm

## 2013-10-03 NOTE — Progress Notes (Signed)
Stroke Team Progress Note  HISTORY [name] is an 80 y.o. female who underwent a vitrectomy 09/29/2013 by the retinal specialist and was returning for follow up The morning of 09/30/2013. She was normal and at baseline when she entered the office. Midway through exam she was noted to have a sudden onset confusion, inability to follow commands or express herself. She was not showing any lateralizing deficits. [facility] a code stroke on arrival she continued to show confusion, and inability to express or follow commands.  Patient had a cervical fracture due to a fall out of bed in April of 2014. Since that time she had been in a wheelchair for the most part, although she can walk some with assistance. She had some resultant left sided weakness and had required assistance for most ADL's.   Date last known well: Date: 09/30/2013  Time last known well: Time: 10:45  tPA Given: No: recent surgery  NIHSS 7  mRankin 3   SUBJECTIVE The patient continues to improve. Her speech is much better today..  OBJECTIVE Most recent Vital Signs: Filed Vitals:   10/02/13 1759 10/02/13 2100 10/03/13 0121 10/03/13 0519  BP: 157/62 166/52 144/55 163/68  Pulse: 62 79 58 65  Temp: 97.9 F (36.6 C) 97.9 F (36.6 C) 97.8 F (36.6 C) 98.5 F (36.9 C)  TempSrc: Oral Oral Oral Oral  Resp: 20 18 20 20   Height:      Weight:      SpO2: 100% 97% 97% 100%   CBG (last 3)   Recent Labs  09/30/13 1203  GLUCAP 93    IV Fluid Intake:     MEDICATIONS  . aspirin  300 mg Rectal Daily   Or  . aspirin  325 mg Oral Daily  . atorvastatin  40 mg Oral q1800  . ciprofloxacin  1 drop Right Eye QID  . ciprofloxacin  500 mg Oral BID  . dorzolamide-timolol  1 drop Right Eye BID  . enoxaparin (LOVENOX) injection  40 mg Subcutaneous Q24H  . lisinopril  5 mg Oral Daily  . metoprolol  50 mg Oral BID  . pantoprazole  40 mg Oral Daily  . pramipexole  0.25 mg Oral QHS  . prednisoLONE acetate   1 drop Right Eye QID   PRN:  acetaminophen, ALPRAZolam, HYDROmorphone (DILAUDID) injection, labetalol, morphine injection, pramipexole  Diet:  Cardiac thin liquids Activity:  As tolerated DVT Prophylaxis:  Lovenox  CLINICALLY SIGNIFICANT STUDIES Basic Metabolic Panel:   Recent Labs Lab 10/02/13 0555 10/03/13 0419  NA 139 138  K 4.4 4.2  CL 100 100  CO2 28 30  GLUCOSE 99 85  BUN 23 20  CREATININE 0.87 0.79  CALCIUM 9.1 9.1   Liver Function Tests:   Recent Labs Lab 09/30/13 1102  AST 32  ALT 26  ALKPHOS 137*  BILITOT 0.4  PROT 7.8  ALBUMIN 3.5   CBC:   Recent Labs Lab 09/30/13 1102 09/30/13 1111  WBC 11.7*  --   NEUTROABS 8.3*  --   HGB 10.6* 11.9*  HCT 32.7* 35.0*  MCV 90.8  --   PLT 299  --    Coagulation:   Recent Labs Lab 09/30/13 1102  LABPROT 13.2  INR 1.02   Cardiac Enzymes:   Recent Labs Lab 09/30/13 1102  TROPONINI <0.30   Urinalysis:   Recent Labs Lab 09/30/13 1240  COLORURINE YELLOW  LABSPEC 1.013  PHURINE 8.0  GLUCOSEU NEGATIVE  HGBUR MODERATE*  BILIRUBINUR NEGATIVE  KETONESUR NEGATIVE  PROTEINUR 100*  UROBILINOGEN 0.2  NITRITE NEGATIVE  LEUKOCYTESUR LARGE*   Lipid Panel    Component Value Date/Time   CHOL 134 10/01/2013 0446   TRIG 87 10/01/2013 0446   HDL 65 10/01/2013 0446   CHOLHDL 2.1 10/01/2013 0446   VLDL 17 10/01/2013 0446   LDLCALC 52 10/01/2013 0446   HgbA1C  Lab Results  Component Value Date   HGBA1C 5.7* 10/01/2013    Urine Drug Screen:     Component Value Date/Time   LABOPIA NONE DETECTED 09/30/2013 1240   COCAINSCRNUR NONE DETECTED 09/30/2013 1240   LABBENZ NONE DETECTED 09/30/2013 1240   AMPHETMU NONE DETECTED 09/30/2013 1240   THCU NONE DETECTED 09/30/2013 1240   LABBARB NONE DETECTED 09/30/2013 1240    Alcohol Level:   Recent Labs Lab 09/30/13 1102  ETH <11    Ct Angio Head W/cm &/or Wo Cm 09/30/2013    80-90% heavily calcified left internal carotid artery stenosis. This is potentially  flow reducing.  No intracranial stenosis, occlusion, or visible acute infarct.  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 No apparent vertebral artery compromise.  Previous C3 through C6 fusion with healing of C5 fracture.    Dg Chest 2 View 10/01/2013    1. New bibasilar increased density is consistent with atelectasis or pneumonia. There are small bilateral pleural effusions. 2. There is enlargement of the cardiac silhouette with prominence of the central pulmonary vascularity consistent with low-grade   Ct Head Wo Contrast 09/30/2013    Involutional and chronic changes without evidence of acute intracranial abnormalities. Iatrogenic changes are appreciated within the right globe.    Mr Brain Wo Contrast 10/01/2013   1. Acute left MCA infarct predominantly involving the temporoparietal region. 2. No evidence of major intracranial arterial occlusion or flow-limiting stenosis.    2D Echocardiogram  Ejection fraction of 40-45%. No cardiac source of emboli identified.  Carotid Doppler  Preliminary report: There is 40-59% right ICA stenosis. There is 60-79% left ICA stenosis. Vertebral artery flow is antegrade.   EKG  Sinus rhythm rate 86 beats per minute  Therapy Recommendations - physical therapist recommends inpatient rehabilitation. Rehabilitation physician consult pending.  Physical Exam    Neurologic Examination:   Mental Status:  Alert still with some aphasia. Occasional nonsensical speech. Most of the time able to answer questions appropriately. Follows commands better but still needs cueing for 2 and 3 step commands. Cranial Nerves:  II: Discs flat bilaterally; Visual fields grossly normal, pupils 8mm on the right and 2 mm on the left. The left is reactive and right is non reactive (surgical eye), round,  III,IV, VI: ptosis present right, extra-ocular motions intact bilaterally with greater movement noted when looking to the left  V,VII: smile  symmetric, slight right NL Decrease on the right  VIII: hearing normal bilaterally  IX,X: gag reflex present  XI: bilateral shoulder shrug  XII: midline tongue extension without atrophy or fasciculations  Motor:  4/5 throughout. Tone and bulk:normal tone throughout; no atrophy noted  Sensory: withdraws to gross noxious stimuli bilateral legs and arms  Deep Tendon Reflexes:  Right: Upper Extremity Left: Upper extremity  biceps (C-5 to C-6) 2/4 biceps (C-5 to C-6) 2/4  tricep (C7) 2/4 triceps (C7) 2/4  Brachioradialis (C6) 2/4 Brachioradialis (C6) 2/4  Lower Extremity Lower Extremity  quadriceps (L-2 to L-4) 2/4 quadriceps (L-2 to L-4) 2/4  Achilles (S1) 0/4 Achilles (S1) 0/4  Plantars:  Right: downgoing Left: downgoing  Cerebellar:  Able to perform finger to nose today with cueing. Gait: not tested  CV: pulses palpable throughout    ASSESSMENT Ms. [name] is a 80 y.o. female presenting with confusion, inability to follow commands, and inability to communicate. TPA was not given secondary to recent surgery. An MRI revealed an acute left MCA infarct predominantly involving the temporoparietal region. Infarct felt to be secondary to left carotid stenosis. On aspirin 81 mg orally every day prior to admission. Now on aspirin 325 mg orally every day for secondary stroke prevention. Patient with resultant confusion. Work up complete.   Vitrectomy 09/29/2013  Coronary artery disease with CHF  Hypertension  Hyperlipidemia - Pravachol prior to admission - now on Lipitor.  80-90% left internal carotid artery stenosis CT angiogram. Dr. Darrick Penna consulted.  Cervical fracture after falling with surgical repair Dr. Danielle Dess 12/28/2012. Family reports residual left upper extremity weakness.   Hospital day # 3  TREATMENT/PLAN  Continue aspirin 325 mg orally every day for secondary stroke prevention.  Physical therapist recommends inpatient rehabilitation. Rehabilitation physician  consult pending.  Dr. Darrick Penna recommends stent versus carotid endarterectomy in 4-6 weeks.  Stroke workup is complete.  We will sign off at this time. Please call if we can be of further assistance.  Followup Dr. Pearlean Brownie in 2 months  Delton See PA-C [facility] Pager [phone] 10/03/2013, 8:16 AM  I have personally obtained a history, examined the patient, evaluated imaging results, and formulated the assessment and plan of care. I agree with the above. Delia Heady, MD

## 2013-10-03 NOTE — Evaluation (Signed)
Occupational Therapy Evaluation Patient Details Name: Toni PartridgeMayfie A Diaz MRN: [id number] DOB: [birth year] Today's Date: 10/03/2013 Time: 1005-1104 OT Time Calculation (min): 59 min  OT Assessment / Plan / Recommendation History of present illness Patient is a 80 y.o. year old female who presents for evaluation of symptomatic left internal carotid stenosis. Pt with aphasia after recent eye procedure.  Some improvement overnight but still not back to baseline.  No obvious motor deficits but has some mild left upper extremity weakness baseline from prior Cspine fracture last spring.    Clinical Impression   Pt. Is very pleasant female who was very cooperative with eval. Pt. Was able to follow 1-2 step directions with extra time to process information. Pt. Would benefit from additional OT at D/C from CIR vs. SNF and is an agreement with plan.     OT Assessment  Patient needs continued OT Services    Follow Up Recommendations  CIR;SNF    Barriers to Discharge      Equipment Recommendations       Recommendations for Other Services    Frequency  Min 3X/week    Precautions / Restrictions Precautions Precautions: Fall Precaution Comments: Patient fearful of falling in standing Restrictions Weight Bearing Restrictions: No   Pertinent Vitals/Pain No c/o pain.    ADL  Eating/Feeding: Simulated;Set up Where Assessed - Eating/Feeding: Edge of bed Grooming: Performed;Wash/dry face;Wash/dry hands;Set up Where Assessed - Grooming: Unsupported sitting Upper Body Bathing: Simulated;Set up Where Assessed - Upper Body Bathing: Unsupported sitting Lower Body Bathing: Minimal assistance;Simulated Where Assessed - Lower Body Bathing: Unsupported standing;Supported standing Upper Body Dressing: Performed;Moderate assistance Where Assessed - Upper Body Dressing: Unsupported sitting Lower Body Dressing: Performed;Moderate assistance Where Assessed - Lower Body Dressing: Unsupported sitting;Supported  standing Toilet Transfer: Simulated;Minimal assistance Toilet Transfer Method: Stand pivot ADL Comments: Pt. has decreased ROM of L SHLD since last April when pt. had fall. Pt. has decreased ability to use L UE for dressing and grooming tasks secondary to decreased ROM.     OT Diagnosis: Generalized weakness;Disturbance of vision  OT Problem List: Decreased range of motion;Decreased activity tolerance;Impaired balance (sitting and/or standing);Impaired vision/perception;Decreased knowledge of use of DME or AE OT Treatment Interventions: Self-care/ADL training;Neuromuscular education;DME and/or AE instruction;Therapeutic activities   OT Goals(Current goals can be found in the care plan section) Acute Rehab OT Goals Patient Stated Goal: To be able to go home OT Goal Formulation: With patient Time For Goal Achievement: 10/17/13 Potential to Achieve Goals: Good ADL Goals Pt Will Perform Grooming: with supervision;standing Pt Will Perform Lower Body Bathing: with supervision;sit to/from stand Pt Will Perform Upper Body Dressing: with min assist;sitting Pt Will Perform Lower Body Dressing: with supervision;with adaptive equipment;sit to/from stand Pt Will Transfer to Toilet: with supervision;stand pivot transfer;ambulating;grab bars  Visit Information  Last OT Received On: 10/03/13 History of Present Illness: Patient is a 80 y.o. year old female who presents for evaluation of symptomatic left internal carotid stenosis. Pt with aphasia after recent eye procedure.  Some improvement overnight but still not back to baseline.  No obvious motor deficits but has some mild left upper extremity weakness baseline from prior Cspine fracture last spring.        Prior Functioning     Home Living Family/patient expects to be discharged to:: Inpatient rehab Living Arrangements: Children Available Help at Discharge: Available 24 hours/day;Family;Home health Type of Home: House Home Access: Ramped  entrance Home Layout: One level Home Equipment: Walker - 2 wheels;Shower seat;Wheelchair - [employer]manual;Bedside commode  Lives With: Family;Other (Comment) Prior Function Level of Independence: Needs assistance Gait / Transfers Assistance Needed: Since fall in April, patient primarily uses w/c.  Is able to ambulate with RW very short distances in house with assist. ADL's / Homemaking Assistance Needed: Assist with bathing, dressing, meal prep, housekeeping Communication Communication: Receptive difficulties;Expressive difficulties Dominant Hand: Right         Vision/Perception Vision - History Baseline Vision: Wears glasses all the time Visual History:  (Pt. underwent vitrectomy on 09/29/13 and is currently low vis) Patient Visual Report:  (blind in R eye.)   Cognition  Cognition Arousal/Alertness: Awake/alert Behavior During Therapy: WFL for tasks assessed/performed Overall Cognitive Status: Difficult to assess Difficult to assess due to:  (communication)    Extremity/Trunk Assessment Upper Extremity Assessment Upper Extremity Assessment: Generalized weakness LUE Deficits / Details: Decreased strength grossly 3-/5.  Decreased shoulder ROM. Arthritic changes wrists/hands LUE Coordination: decreased gross motor     Mobility Bed Mobility Supine to sit: Min assist Transfers Overall transfer level: Needs assistance Equipment used: Rolling walker (2 wheeled) Transfers: Stand Pivot Transfers Sit to Stand: Min assist     Exercise     Balance     End of Session OT - End of Session Equipment Utilized During Treatment: Gait belt;Rolling walker Activity Tolerance: Patient tolerated treatment well Patient left: in chair;with call bell/phone within reach  GO     Alvan Culpepper 10/03/2013, 11:02 AM

## 2013-10-03 NOTE — Progress Notes (Signed)
Patient Name: [name] Date of Encounter: 10/03/2013  Principal Problem:   CVA (cerebral infarction) Active Problems:   Coronary artery disease   Hypertension   Anemia of other chronic disease   Slurred speech   UTI (urinary tract infection)   Occlusion and stenosis of carotid artery with cerebral infarction    SUBJECTIVE: Resting comfortably on oxygen, no chest pain or SOB.  OBJECTIVE Filed Vitals:   10/02/13 1759 10/02/13 2100 10/03/13 0121 10/03/13 0519  BP: 157/62 166/52 144/55 163/68  Pulse: 62 79 58 65  Temp: 97.9 F (36.6 C) 97.9 F (36.6 C) 97.8 F (36.6 C) 98.5 F (36.9 C)  TempSrc: Oral Oral Oral Oral  Resp: 20 18 20 20   Height:      Weight:      SpO2: 100% 97% 97% 100%    Intake/Output Summary (Last 24 hours) at 10/03/13 0915 Last data filed at 10/02/13 1759  Gross per 24 hour  Intake    240 ml  Output      1 ml  Net    239 ml   Filed Weights   09/30/13 1535  Weight: 170 lb 6.7 oz (77.3 kg)    PHYSICAL EXAM General: Well developed, elderly, female in no acute distress. Head: Normocephalic, atraumatic.  Neck: Supple without bruits, JVD at 10 cm. Lungs:  Resp regular and unlabored, few rales bases. Heart: RRR, S1, S2, no S3, S4, or murmur; no rub. Abdomen: Soft, non-tender, non-distended, BS + x 4.  Extremities: No clubbing, cyanosis, no edema.  Neuro: Alert and oriented X 3. Moves all extremities spontaneously. Psych: Normal affect.  LABS: CBC: Recent Labs  09/30/13 1102 09/30/13 1111  WBC 11.7*  --   NEUTROABS 8.3*  --   HGB 10.6* 11.9*  HCT 32.7* 35.0*  MCV 90.8  --   PLT 299  --    INR: Recent Labs  09/30/13 1102  INR 1.02   Basic Metabolic Panel: Recent Labs  10/02/13 0555 10/03/13 0419  NA 139 138  K 4.4 4.2  CL 100 100  CO2 28 30  GLUCOSE 99 85  BUN 23 20  CREATININE 0.87 0.79  CALCIUM 9.1 9.1   Liver Function Tests: Recent Labs  09/30/13 1102  AST 32  ALT 26  ALKPHOS 137*  BILITOT 0.4    PROT 7.8  ALBUMIN 3.5   Cardiac Enzymes: Recent Labs  09/30/13 1102  TROPONINI <0.30    Recent Labs  09/30/13 1109  TROPIPOC 0.00   BNP: Pro B Natriuretic peptide (BNP)  Date/Time Value Range Status  10/02/2013  5:55 AM 2552.0* 0 - 450 pg/mL Final   Hemoglobin A1C: Recent Labs  10/01/13 0446  HGBA1C 5.7*   Fasting Lipid Panel: Recent Labs  10/01/13 0446  CHOL 134  HDL 65  LDLCALC 52  TRIG 87  CHOLHDL 2.1   TELE:  SR, sinus arrhythmia, sinus brady    ECHO: 10/01/2013  Study Conclusions - Left ventricle: Systolic function was mildly to moderately reduced. The estimated ejection fraction was in the range of 40% to 45%. Moderate hypokinesis of the inferolateral, inferior, and inferoseptal myocardium. Features are consistent with a pseudonormal left ventricular filling pattern, with concomitant abnormal relaxation and increased filling pressure (grade 2 diastolic dysfunction). - Mitral valve: Calcified annulus. Mild to moderate regurgitation directed centrally. - Left atrium: The atrium was severely dilated. - Pulmonary arteries: Systolic pressure was moderately increased. PA peak pressure: 56mm Hg (S).   Radiology/Studies: Dg Chest  2 View 10/01/2013   CLINICAL DATA:  Stroke symptoms  EXAM: CHEST  2 VIEW  COMPARISON:  PA and lateral chest x-ray dated March 19, 2008.  FINDINGS: The lungs are mildly hypoinflated. There are increased lung markings at both lung bases consistent with atelectasis or pneumonia. The hemidiaphragms remain visible but there is blunting of the costophrenic angles consistent with small bilateral pleural effusions. The cardiopericardial silhouette is enlarged. The central pulmonary vascularity is engorged. There is chronic deformity of the midshaft of the right clavicle.  IMPRESSION: 1. New bibasilar increased density is consistent with atelectasis or pneumonia. There are small bilateral pleural effusions. 2. There is enlargement of the cardiac  silhouette with prominence of the central pulmonary vascularity consistent with low-grade CHF.   Electronically Signed   By: David  [place]   On: 10/01/2013 09:20   Ct Angio Neck W/cm &/or Wo/cm 09/30/2013   CLINICAL DATA:  Right eye surgery 1 day ago for retinal disc abdomen. Developed garbled speech and possible syncopal episode  EXAM: CT ANGIOGRAPHY HEAD AND NECK  TECHNIQUE: Multidetector CT imaging of the head and neck was performed using the standard protocol during bolus administration of intravenous contrast. Multiplanar CT image reconstructions including MIPs were obtained to evaluate the vascular anatomy. Carotid stenosis measurements (when applicable) are obtained utilizing NASCET criteria, using the distal internal carotid diameter as the denominator.  CONTRAST:  50mL OMNIPAQUE IOHEXOL 350 MG/ML SOLN  COMPARISON:  Noncontrast CT head earlier today.  FINDINGS: CTA HEAD FINDINGS  Moderate cerebral and cerebellar atrophy. No cortical hypodensity. No abnormal postcontrast enhancement. Biparietal thinning. Major dural venous sinuses patent. Postop changes in the right globe.  50% cavernous ICA stenosis on the right. Calcific non stenotic atheromatous change in the cavernous region on the left. Widely patent supraclinoid ICAs bilaterally. Widely patent basilar artery with right vertebral dominant. Left vertebral ends in PICA. No intracranial stenosis or occlusion is evident.  Review of the MIP images confirms the above findings.  CTA NECK FINDINGS  Transverse arch heavily calcified with some mural thrombus proximally. Conventional branching of great vessels from the arch. Moderate atheromatous change at the right and left subclavian origin. Right vertebral dominant with left vertebral hypoplastic.  Heavily calcified plaque at the right carotid bifurcation extending into the proximal right internal carotid artery. Ratio of 2.9/ 4.6 proximal/distal is consistent with a less than 50% stenosis. No ulceration or  soft plaque. No evidence for dissection.  Extensive heavily calcified left carotid bifurcation with severe luminal narrowing. Ratio of 0.8/5.2 proximal/distal consistent with a 80-90% stenosis. No ulceration or soft plaque. No evidence for dissection.  The patient is status post C3- C6 posterior fusion with laminectomy. Moderate anterolisthesis T1 on T2 is degenerative in nature. There is healing of the previously identified C5 fracture with restoration of normal alignment. Instrumentation grossly intact. There is fusion at least across the C3-4 interspace. There is nonunion of a type 2 odontoid fracture with mild canal stenosis on the right but no apparent vertebral artery compromise. No lung apex lesion. No neck masses. No osseous destructive lesion although skeletal osteopenia is noted.  Review of the MIP images confirms the above findings.  IMPRESSION: 80-90% heavily calcified left internal carotid artery stenosis. This is potentially flow reducing.  No intracranial stenosis, occlusion, or visible acute infarct.  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 No apparent vertebral artery compromise.  Previous C3 through C6 fusion with healing of C5 fracture.  Vascular Findings reviewed with ordering  provider.   Electronically Signed   By: Davonna Belling M.D.   On: 09/30/2013 12:47   Mr Maxine Glenn Head/brain Wo Cm 10/01/2013   CLINICAL DATA:  Acute onset mental status changes and difficulty speaking.  EXAM: MRI HEAD WITHOUT CONTRAST  MRA HEAD WITHOUT CONTRAST  TECHNIQUE: Multiplanar, multiecho pulse sequences of the brain and surrounding structures were obtained without intravenous contrast. Angiographic images of the head were obtained using MRA technique without contrast.  COMPARISON:  Head CT and CTA 09/30/2013  FINDINGS: MRI HEAD FINDINGS  Limited visualization of the upper cervical spine demonstrate nonunited dens fracture, better seen on recent CT. Incidental note is made of a partially  empty sella. Restricted diffusion is present in cortical and subcortical locations in the left temporoparietal region as well as posterior left insula, consistent with acute infarcts. There is mild edema within these regions of infarct without significant mass effect. There is also a punctate focus of restricted diffusion in left frontal lobe cortex more superiorly. There is no evidence of intracranial hemorrhage. There is mild-to-moderate generalized cerebral atrophy. Scattered, small foci of T2 hyperintensity within the subcortical and deep cerebral white matter bilaterally as well as within the pons are nonspecific but compatible with mild to moderate chronic small vessel ischemic disease. There is no midline shift or extra-axial fluid collection. Major intracranial vascular flow voids are unremarkable. Abnormal signal within the right globe is likely related to recent surgery. The left maxillary sinus is small.   MRA HEAD FINDINGS  The visualized distal vertebral arteries are patent. The right vertebral artery is dominant the vertebral artery ending in PICA. Right PICA is patent. Basilar artery is patent. SCAs are patent. There is a fetal origin of the left PCA. PCAs are otherwise unremarkable. Internal carotid arteries are patent from skullbase to carotid terminus. Mild right greater than left cavernous carotid atherosclerosis is again seen, better demonstrated on recent CTA. ACA and MCA origins and visualized branches are patent and unremarkable. There is a patent anterior communicating artery. No intracranial aneurysm is identified.  IMPRESSION: 1. Acute left MCA infarct predominantly involving the temporoparietal region. 2. No evidence of major intracranial arterial occlusion or flow-limiting stenosis. These results will be called to the ordering clinician or representative by the Radiologist Assistant, and communication documented in the PACS Dashboard.   Electronically Signed   By: Sebastian Ache   On:  10/01/2013 09:22     Current Medications:  . aspirin  300 mg Rectal Daily   Or  . aspirin  325 mg Oral Daily  . atorvastatin  40 mg Oral q1800  . ciprofloxacin  1 drop Right Eye QID  . ciprofloxacin  500 mg Oral BID  . dorzolamide-timolol  1 drop Right Eye BID  . enoxaparin (LOVENOX) injection  40 mg Subcutaneous Q24H  . lisinopril  5 mg Oral Daily  . metoprolol  50 mg Oral BID  . pantoprazole  40 mg Oral Daily  . pramipexole  0.25 mg Oral QHS  . prednisoLONE acetate  1 drop Right Eye QID      ASSESSMENT AND PLAN: 80 yo female with history of non-obs CAD by remote cath and preserved EF as well as HTN, HL, fall with cervical fx 12/2012 with surgery, and vitrectomy 01/15, was admitted 01/16 after sudden onset of CVA symptoms in Ophthalmology MD office.     LVD - see echo report above. EF 59% at [facility] in 2002, now 40-45% with WMA, mild-mod MR. She has grade 2 diastolic dysfunction  as well. PAS 56, pt rec'd Lasix 40 mg PO x 1 on 01/17. Will add Lasix 20 mg PO daily and follow weights, I/O; recheck BMET in am. On ACE/BB. Of note, pt had acute renal failure in 2009, Cr peaked at 2.85 after lumbar decompression when she was on HCTZ and ACE, both d/c'd.    Hx non-obs CAD by cath approx 1996. No history of ischemic symptoms, MD advise if further eval needed and its timing. May need CEA is for 80-90% LICA disease in 4-6 weeks.  Otherwise, per  Principal Problem:   CVA (cerebral infarction) Active Problems:   Coronary artery disease   Hypertension - SBP occasionally 130s, generally 140s-160s.    Anemia of other chronic disease   Slurred speech   UTI (urinary tract infection)   Occlusion and stenosis of carotid artery with cerebral infarction - 80-90% LICA disease; Per neuro, stent vs CEA in 4-6 weeks.     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 She walks a little at baseline.  Signed, Theodore Demarkhonda Barrett ,  PA-C 9:15 AM 10/03/2013 Patient seen and examined and history reviewed. Agree with above findings and plan. I reviewed history available in Epic as well as Dr. Carollee Massedhompson's note concerning [facility]. Patient had remote cath which showed a 95% distal RCA obstruction. Otherwise nonobstructive disease. She had a nuclear stress test in 2008 for pre op knee surgery and I cannot find those results. Apparently [facility] showed normal EF. Now EF is reduced at 40-45% with inferior WMA. Significant carotid disease that may require surgery. I agree with low dose lasix for now. I would not add an ACEi given history of renal impairment. I would recommend an outpatient lexiscan myoview to assess her coronary ischemic risk and to clear her for potential carotid surgery.  Theron Aristaeter [facility] CenterJordanMD,FACC 10/03/2013 11:54 AM

## 2013-10-03 NOTE — Progress Notes (Signed)
[facility] PROGRESS NOTE  [name] OZH:[id number] DOB: [birth year] DOA: 09/30/2013 PCP: [name], MD  Assessment/Plan: #1 acute CVA The MRI of the head with acute left MCA infarct. CT angio of head and neck with 80-90% LICA stenosis. Continue ASA for secondary stroke prevention. 2-D echo with EF 40-45%, with moderate hypokinesis of inferolateral, inferiror, and inferoseptal myocardium. pending. Vascular surgery has been consulted per neurology for evaluation of left internal carotid artery stenosis and recommend stent vs CEA in 4-6 weeks. Neurology is following and appreciate input and recommendations.  #2 left ICA stenosis( 80-90% LICA) Vascular surgery ff and recommend stent vs CEA in 4-6 weeks.  #3 abnormal 2-D echo 2-D echo during this hospitalization an EF= 40-45% with wall motion abnormalities. Spoke with Dr. Anne Fu of [facility] cardiology yesterday, who looked up records from the [facility] office of prior 2-D echo done on the patient. Records indicate that on 09/09/2012 patient had a 2-D echo done with a normal ejection fraction mild LVH and mild TR/MR. Also mild dilated left atrium. Patient also noted to have a cardiac catheterization in 2008 that showed a 95% RCA stenosis as well as a 30-40% LAD stenosis which was managed medically. Cardiology has been consulted and ff. D/C ACE inhibitor per cardiology rxcs. and recommended outpatient Lexiscan myoview for preop clearance prior to potential carotid surgery.  #4 UTI Urine cultures pending. Continue IV Cipro.  #5 status post vitrectomy 09/29/2013 Patient has been seen by ophthalmology and recommend continue all eye drops as ordered and followup one week post discharge as outpatient.  #6 history of CHF Stable. Continue metoprolol.   #7 hypertension Stable. Metoprolol.  #8 anxiety Continue anxiolytics as needed.  #9 hyperlipidemia Continue statin.   Code Status: full Family Communication: updated patient  at bedside. Disposition Plan: CIR versus SNF when medically stable.   Consultants:  Neurology:Dr. Thad Ranger 09/30/2013  Ophthalmology: Dr. Allyne Gee 10/01/2013  Procedures:  MRI/MRA head 10/01/2013  Chest x-ray 10/01/2013  CT angiogram of head and neck 09/30/2013  Antibiotics:  none  HPI/Subjective: Patient states she's feeling better. Expressive aphasia. Confusion improved.  Objective: Filed Vitals:   10/03/13 1047  BP: 129/72  Pulse: 65  Temp: 97.5 F (36.4 C)  Resp: 20    Intake/Output Summary (Last 24 hours) at 10/03/13 1150 Last data filed at 10/02/13 1759  Gross per 24 hour  Intake    240 ml  Output      0 ml  Net    240 ml   Filed Weights   09/30/13 1535 10/03/13 0953  Weight: 77.3 kg (170 lb 6.7 oz) 76.7 kg (169 lb 1.5 oz)    Exam:   General:  NAD  Cardiovascular: RRR  Respiratory: CTAB  Abdomen: soft, nontender, nondistended, positive bowel sounds.  Musculoskeletal: no clubbing cyanosis or edema  Data Reviewed: Basic Metabolic Panel:  Recent Labs Lab 09/30/13 1102 09/30/13 1111 10/02/13 0555 10/03/13 0419  NA 139 140 139 138  K 4.3 4.2 4.4 4.2  CL 101 101 100 100  CO2 27  --  28 30  GLUCOSE 98 99 99 85  BUN 29* 29* 23 20  CREATININE 0.82 1.00 0.87 0.79  CALCIUM 9.6  --  9.1 9.1   Liver Function Tests:  Recent Labs Lab 09/30/13 1102  AST 32  ALT 26  ALKPHOS 137*  BILITOT 0.4  PROT 7.8  ALBUMIN 3.5   No results found for this basename: LIPASE, AMYLASE,  in the last 168 hours No results found for  this basename: AMMONIA,  in the last 168 hours CBC:  Recent Labs Lab 09/30/13 1102 09/30/13 1111  WBC 11.7*  --   NEUTROABS 8.3*  --   HGB 10.6* 11.9*  HCT 32.7* 35.0*  MCV 90.8  --   PLT 299  --    Cardiac Enzymes:  Recent Labs Lab 09/30/13 1102  TROPONINI <0.30   BNP (last 3 results)  Recent Labs  10/02/13 0555  PROBNP 2552.0*   CBG:  Recent Labs Lab 09/30/13 1203  GLUCAP 93    Recent Results  (from the past 240 hour(s))  URINE CULTURE     Status: None   Collection Time    09/30/13 12:40 PM      Result Value Range Status   Specimen Description URINE, CLEAN CATCH   Final   Special Requests NONE   Final   Culture  Setup Time     Final   Value: 09/30/2013 17:09     Performed at [facility]   Colony Count     Final   Value: 50,000 COLONIES/ML     Performed at [facility]   Culture     Final   Value: PROTEUS MIRABILIS     Performed at [facility]   Report Status 10/02/2013 FINAL   Final   Organism ID, Bacteria PROTEUS MIRABILIS   Final     Studies: No results found.  Scheduled Meds: . aspirin  300 mg Rectal Daily   Or  . aspirin  325 mg Oral Daily  . atorvastatin  40 mg Oral q1800  . ciprofloxacin  1 drop Right Eye QID  . ciprofloxacin  500 mg Oral BID  . dorzolamide-timolol  1 drop Right Eye BID  . enoxaparin (LOVENOX) injection  40 mg Subcutaneous Q24H  . furosemide  20 mg Oral Daily  . lisinopril  5 mg Oral Daily  . metoprolol  50 mg Oral BID  . pantoprazole  40 mg Oral Daily  . pramipexole  0.25 mg Oral QHS  . prednisoLONE acetate  1 drop Right Eye QID   Continuous Infusions:   Principal Problem:   CVA (cerebral infarction) Active Problems:   Coronary artery disease   Hypertension   Anemia of other chronic disease   Slurred speech   UTI (urinary tract infection)   Occlusion and stenosis of carotid artery with cerebral infarction    Time spent: 35 minutes    [name] M.D. [facility] Pager [phone]. If 7PM-7AM, please contact night-coverage at [email or web address], password [email or web address] 10/03/2013, 11:50 AM  LOS: 3 days

## 2013-10-04 ENCOUNTER — Other Ambulatory Visit: Payer: Self-pay | Admitting: Physician Assistant

## 2013-10-04 DIAGNOSIS — R931 Abnormal findings on diagnostic imaging of heart and coronary circulation: Secondary | ICD-10-CM

## 2013-10-04 LAB — BASIC METABOLIC PANEL
BUN: 18 mg/dL (ref 6–23)
CALCIUM: 9.2 mg/dL (ref 8.4–10.5)
CHLORIDE: 101 meq/L (ref 96–112)
CO2: 28 mEq/L (ref 19–32)
CREATININE: 0.84 mg/dL (ref 0.50–1.10)
GFR, EST AFRICAN AMERICAN: 74 mL/min — AB (ref >90)
GFR, EST NON AFRICAN AMERICAN: 64 mL/min — AB (ref >90)
Glucose, Bld: 89 mg/dL (ref 70–99)
Potassium: 4.2 mEq/L (ref 3.7–5.3)
Sodium: 140 mEq/L (ref 137–147)

## 2013-10-04 MED ORDER — DOCUSATE SODIUM 100 MG PO CAPS
100.0000 mg | ORAL_CAPSULE | Freq: Two times a day (BID) | ORAL | Status: DC
  Administered 2013-10-04 – 2013-10-06 (×4): 100 mg via ORAL
  Filled 2013-10-04 (×3): qty 1

## 2013-10-04 MED ORDER — DICLOFENAC SODIUM 1 % TD GEL
2.0000 g | Freq: Four times a day (QID) | TRANSDERMAL | Status: DC | PRN
  Administered 2013-10-04 – 2013-10-05 (×3): 2 g via TOPICAL
  Filled 2013-10-04: qty 100

## 2013-10-04 MED ORDER — POLYETHYLENE GLYCOL 3350 17 G PO PACK
17.0000 g | PACK | Freq: Every day | ORAL | Status: DC
  Administered 2013-10-04 – 2013-10-06 (×3): 17 g via ORAL
  Filled 2013-10-04 (×3): qty 1

## 2013-10-04 NOTE — Clinical Social Work Placement (Signed)
Clinical Social Work Department CLINICAL SOCIAL WORK PLACEMENT NOTE 10/04/2013  Patient:  Toni CootsGLEASON,[name]  Account Number:  [id number] Admit date:  09/30/2013  Clinical Social Worker:  Irving BurtonEMILY SUM[place], LCSWA  Date/time:  10/04/2013 03:20 PM  Clinical Social Work is seeking post-discharge placement for this patient at the following level of care:   SKILLED NURSING   (*CSW will update this form in Epic as items are completed)   10/04/2013  Patient/family provided with [facility] Department of Clinical Social Works list of facilities offering this level of care within the geographic area requested by the patient (or if unable, by the patients family).  10/04/2013  Patient/family informed of their freedom to choose among providers that offer the needed level of care, that participate in Medicare, Medicaid or managed care program needed by the patient, have an available bed and are willing to accept the patient.  10/04/2013  Patient/family informed of [facility] ownership interest in [facility], as well as of the fact that they are under no obligation to receive care at this facility.  PASARR submitted to EDS on 10/04/2013 PASARR number received from EDS on 10/04/2013  FL2 transmitted to all facilities in geographic area requested by pt/family on  10/04/2013 FL2 transmitted to all facilities within larger geographic area on   Patient informed that his/her managed care company has contracts with or will negotiate with  certain facilities, including the following:     Patient/family informed of bed offers received:   Patient chooses bed at  Physician recommends and patient chooses bed at    Patient to be transferred to  on   Patient to be transferred to facility by   The following physician request were entered in Epic:   Additional Comments:  Darlyn ChamberEmily [place], Theresia MajorsLCSWA Clinical Social Worker [phone]

## 2013-10-04 NOTE — Progress Notes (Signed)
Patient Name: [name] Date of Encounter: 10/04/2013  Principal Problem:   CVA (cerebral infarction) Active Problems:   Coronary artery disease   Hypertension   Anemia of other chronic disease   Slurred speech   UTI (urinary tract infection)   Occlusion and stenosis of carotid artery with cerebral infarction    SUBJECTIVE: No chest pain, no SOB, feels she is doing better today, still some problems with speech.  OBJECTIVE Filed Vitals:   10/04/13 0052 10/04/13 0500 10/04/13 0516 10/04/13 0700  BP: 113/58  119/84 164/69  Pulse: 60  63 68  Temp: 97.9 F (36.6 C)  97.6 F (36.4 C) 98.6 F (37 C)  TempSrc: Oral  Oral Oral  Resp: 22  20 18   Height:      Weight:  168 lb 14 oz (76.6 kg)    SpO2: 95%  100% 96%    Intake/Output Summary (Last 24 hours) at 10/04/13 0825 Last data filed at 10/04/13 0600  Gross per 24 hour  Intake    425 ml  Output    150 ml  Net    275 ml   Filed Weights   09/30/13 1535 10/03/13 0953 10/04/13 0500  Weight: 170 lb 6.7 oz (77.3 kg) 169 lb 1.5 oz (76.7 kg) 168 lb 14 oz (76.6 kg)    PHYSICAL EXAM General: Well developed, elderly, female in no acute distress. Head: Normocephalic, atraumatic.  Neck: Supple without bruits, JVD not elevated. Lungs:  Resp regular and unlabored, CTA. Heart: RRR, S1, S2, no S3, S4, or murmur; no rub. Abdomen: Soft, non-tender, non-distended, BS + x 4.  Extremities: No clubbing, cyanosis, no edema.  Neuro: Alert and oriented X 3. Moves all extremities spontaneously. Psych: Normal affect.  LABS: CBC:No results found for this basename: WBC, NEUTROABS, HGB, HCT, MCV, PLT,  in the last 72 hours  Basic Metabolic Panel: Recent Labs  10/03/13 0419 10/04/13 0604  NA 138 140  K 4.2 4.2  CL 100 101  CO2 30 28  GLUCOSE 85 89  BUN 20 18  CREATININE 0.79 0.84  CALCIUM 9.1 9.2   BNP: Pro B Natriuretic peptide (BNP)  Date/Time Value Range Status  10/02/2013  5:55 AM 2552.0* 0 - 450 pg/mL Final     TELE:  SR, occ. S brady  Radiology/Studies: No results found.   Current Medications:  . aspirin  300 mg Rectal Daily   Or  . aspirin  325 mg Oral Daily  . atorvastatin  40 mg Oral q1800  . ciprofloxacin  1 drop Right Eye QID  . ciprofloxacin  500 mg Oral BID  . dorzolamide-timolol  1 drop Right Eye BID  . enoxaparin (LOVENOX) injection  40 mg Subcutaneous Q24H  . furosemide  20 mg Oral Daily  . metoprolol  50 mg Oral BID  . pantoprazole  40 mg Oral Daily  . pramipexole  0.25 mg Oral QHS  . prednisoLONE acetate  1 drop Right Eye QID    ASSESSMENT AND PLAN: 80 yo female with history of 30-40%LAD and 95% distal RCA stenosis by remote cath  and preserved EF by echo December 2013, as well as HTN, HL, fall with cervical fx 12/2012 with surgery; who had a vitrectomy 01/15, was admitted 01/16 after sudden onset of CVA symptoms in Ophthalmology MD office.    Coronary artery disease - now with LVD, presumed ICM. On BB, low-dose Lasix, and statin. No ACE/ARB for with borderline BP at times. Have scheduled Lexiscan  and f/u appt for the second week in February.   Principal Problem:   CVA (cerebral infarction) Active Problems:   Hypertension   Anemia of other chronic disease   Slurred speech   UTI (urinary tract infection)   Occlusion and stenosis of carotid artery with cerebral infarction   Signed, Theodore Demark , PA-C 8:25 AM 10/04/2013 Patient seen and examined and history reviewed. Agree with above findings and plan. Plan as noted above with lexiscan myoview and follow up with Dr. Anne Fu in February. No other acute cardiac issues so we will sign off. Please call if you have further questions.  Theron Arista [facility] 10/04/2013 10:52 AM

## 2013-10-04 NOTE — Progress Notes (Signed)
Speech Language Pathology Treatment: Cognitive-Linquistic  Patient Details Name: Toni PartridgeMayfie A Diaz MRN: [id number] DOB: [birth year] Today's Date: 10/04/2013 Time: 1510-1551 SLP Time Calculation (min): 41 min  Assessment / Plan / Recommendation Clinical Impression  Pt seen for f/u aphasia treatment and education with daughters [name] and [name] present. Pt presents with moderate word-finding difficulty at the conversational level with Min cues for self-monitoring and correcting. SLP provided education regarding word-finding strategies. Pt required Mod cueing for utilization of strategies at the word-level. Continue plan of care.   HPI HPI: Pt. is an 80 y.o. female with a PMH: HTN, CAD, dyslipidemia who underwent vitrectomy on 09/29/2013, presented to her ophthalmologist office for a followup visit and developed sudden onset mental status changes at the Ophthalmology office, as initially having a blank stare and noted to be confused and disoriented, unable to follow commands or speak. There was no loss of consciousness during this time. After this she was noted to have slurred speech and difficulties getting words out. In the emergency room she is able to follow commands however continues to have difficulties with getting words out. Initial CT scan of brain did not show acute intracranial abnormalities.   Pertinent Vitals N/A  SLP Plan  Goals updated    Recommendations                General recommendations: Rehab consult Oral Care Recommendations: Oral care BID Follow up Recommendations: Inpatient Rehab;24 hour supervision/assistance Plan: Goals updated    GO       [name], M.A. CCC-SLP [phone]  [name], [name] 10/04/2013, 3:56 PM

## 2013-10-04 NOTE — Clinical Social Work Psychosocial (Signed)
Clinical Social Work Department BRIEF PSYCHOSOCIAL ASSESSMENT 10/04/2013  Patient:  [name], [name]     Account Number:  [id number]     Admit date:  09/30/2013  Clinical Social Worker:  Donna Christen  Date/Time:  10/04/2013 02:54 PM  Referred by:  Physician  Date Referred:  10/04/2013 Referred for  SNF Placement   Other Referral:   none.   Interview type:  Patient Other interview type:   Pt's daughters are at bedside and helped with answering home life questions for pt.    PSYCHOSOCIAL DATA Living Status:  ALONE Admitted from facility:   Level of care:   Primary support name:  [name] Primary support relationship to patient:  CHILD, ADULT Degree of support available:   Strong support system. Pt has six children that help in her care, and she has a private aide that spends the night with her.    CURRENT CONCERNS Current Concerns  Post-Acute Placement   Other Concerns:   none.    SOCIAL WORK ASSESSMENT / PLAN CSW met with pt and her two daughters at bedside. Pt insisted that her daughters be present because her memory was "bad lately." CSW explained her role at [facility] and her purpose for speaking with the family today. Pt and her daughters were open to speaking with CSW. CSW explained that pt was being referred for CIR at time of discharge, but CSW would like to see if family was open to SNF placement as a back-up option. Family stated that pt has been to both CIR and SNF [facility]) before, and that family would like for CIR to be first choice and [facility] as second. Pt stated that she lives at home alone, but has a lot of help. Pt's daughter Vickii Chafe explained that pt has a Charity fundraiser that is with her from 8:30pm-8:30am, then her other daughter stays with her throughout the morning and into the afternoon where Vickii Chafe comes and stays until the private aide returns at 8:30pm. Vickii Chafe stated that family will continue this routine as long as they can due to pt not  wanting to live in a SNF or ALF. CSW acknowledged that family has a lot going and that the pt is lucky to have a supportive family. Pt and her daughters seemed at ease and comfortable with the discharge plan. CSW will continue to follow and assist with discharge planning needs as needed.   Assessment/plan status:  Psychosocial Support/Ongoing Assessment of Needs Other assessment/ plan:   none.   Information/referral to community resources:   [facility] SNF bed offers if needed.    PATIENTS/FAMILYS RESPONSE TO PLAN OF CARE: Pt and pt's daughter are understanding and agreeable to CSW plan of care.       Pati Gallo, [place] Social Worker [phone]

## 2013-10-04 NOTE — Progress Notes (Signed)
[facility] PROGRESS NOTE  Lakeyia A Maino ZOX:[id number] DOB: [birth year] DOA: 09/30/2013 PCP: [name], MD  Assessment/Plan: #1 acute CVA The MRI of the head with acute left MCA infarct. CT angio of head and neck with 80-90% LICA stenosis. Continue ASA for secondary stroke prevention. 2-D echo with EF 40-45%, with moderate hypokinesis of inferolateral, inferiror, and inferoseptal myocardium. pending. Vascular surgery has been consulted per neurology for evaluation of left internal carotid artery stenosis and recommend stent vs CEA in 4-6 weeks. Neurology is following and appreciate input and recommendations.  #2 left ICA stenosis( 80-90% LICA) Vascular surgery ff and recommend stent vs CEA in 4-6 weeks.  #3 abnormal 2-D echo 2-D echo during this hospitalization an EF= 40-45% with wall motion abnormalities. Spoke with Dr. Anne Fu of [facility] cardiology yesterday, who looked up records from the [place] office of prior 2-D echo done on the patient. Records indicate that on 09/09/2012 patient had a 2-D echo done with a normal ejection fraction mild LVH and mild TR/MR. Also mild dilated left atrium. Patient also noted to have a cardiac catheterization in 2008 that showed a 95% RCA stenosis as well as a 30-40% LAD stenosis which was managed medically. Cardiology has been consulted and ff. D/C'd ACE inhibitor per cardiology rxcs. and recommended outpatient Lexiscan myoview for preop clearance prior to potential carotid surgery.  #4 UTI Urine cultures c/w proteus mirabilis. D/c Cipro.  #5 status post vitrectomy 09/29/2013 Patient has been seen by ophthalmology and recommend continue all eye drops as ordered and followup one week post discharge as outpatient.  #6 history of CHF Stable. Continue metoprolol.   #7 hypertension Stable. Metoprolol.  #8 anxiety Continue anxiolytics as needed.  #9 hyperlipidemia Continue statin.   Code Status: full Family Communication: updated  patient at bedside. Disposition Plan: CIR versus SNF when medically stable.   Consultants:  Neurology:Dr. Thad Ranger 09/30/2013  Ophthalmology: Dr. Allyne Gee 10/01/2013  Procedures:  MRI/MRA head 10/01/2013  Chest x-ray 10/01/2013  CT angiogram of head and neck 09/30/2013  Antibiotics:  IV Cipro 10/01/13---> 10/02/13  Oral cipro 10/03/13  HPI/Subjective: Patient states she's feeling better. Expressive aphasia improved. Confusion improved.  Objective: Filed Vitals:   10/04/13 1031  BP: 168/60  Pulse: 86  Temp:   Resp:     Intake/Output Summary (Last 24 hours) at 10/04/13 1148 Last data filed at 10/04/13 0831  Gross per 24 hour  Intake    665 ml  Output    150 ml  Net    515 ml   Filed Weights   09/30/13 1535 10/03/13 0953 10/04/13 0500  Weight: 77.3 kg (170 lb 6.7 oz) 76.7 kg (169 lb 1.5 oz) 76.6 kg (168 lb 14 oz)    Exam:   General:  NAD  Cardiovascular: RRR  Respiratory: CTAB  Abdomen: soft, nontender, nondistended, positive bowel sounds.  Musculoskeletal: no clubbing cyanosis or edema  Data Reviewed: Basic Metabolic Panel:  Recent Labs Lab 09/30/13 1102 09/30/13 1111 10/02/13 0555 10/03/13 0419 10/04/13 0604  NA 139 140 139 138 140  K 4.3 4.2 4.4 4.2 4.2  CL 101 101 100 100 101  CO2 27  --  28 30 28   GLUCOSE 98 99 99 85 89  BUN 29* 29* 23 20 18   CREATININE 0.82 1.00 0.87 0.79 0.84  CALCIUM 9.6  --  9.1 9.1 9.2   Liver Function Tests:  Recent Labs Lab 09/30/13 1102  AST 32  ALT 26  ALKPHOS 137*  BILITOT 0.4  PROT 7.8  ALBUMIN 3.5   No results found for this basename: LIPASE, AMYLASE,  in the last 168 hours No results found for this basename: AMMONIA,  in the last 168 hours CBC:  Recent Labs Lab 09/30/13 1102 09/30/13 1111  WBC 11.7*  --   NEUTROABS 8.3*  --   HGB 10.6* 11.9*  HCT 32.7* 35.0*  MCV 90.8  --   PLT 299  --    Cardiac Enzymes:  Recent Labs Lab 09/30/13 1102  TROPONINI <0.30   BNP (last 3  results)  Recent Labs  10/02/13 0555  PROBNP 2552.0*   CBG:  Recent Labs Lab 09/30/13 1203  GLUCAP 93    Recent Results (from the past 240 hour(s))  URINE CULTURE     Status: None   Collection Time    09/30/13 12:40 PM      Result Value Range Status   Specimen Description URINE, CLEAN CATCH   Final   Special Requests NONE   Final   Culture  Setup Time     Final   Value: 09/30/2013 17:09     Performed at [facility]   Colony Count     Final   Value: 50,000 COLONIES/ML     Performed at [facility]   Culture     Final   Value: PROTEUS MIRABILIS     Performed at [facility]   Report Status 10/02/2013 FINAL   Final   Organism ID, Bacteria PROTEUS MIRABILIS   Final     Studies: No results found.  Scheduled Meds: . aspirin  300 mg Rectal Daily   Or  . aspirin  325 mg Oral Daily  . atorvastatin  40 mg Oral q1800  . ciprofloxacin  1 drop Right Eye QID  . ciprofloxacin  500 mg Oral BID  . dorzolamide-timolol  1 drop Right Eye BID  . enoxaparin (LOVENOX) injection  40 mg Subcutaneous Q24H  . furosemide  20 mg Oral Daily  . metoprolol  50 mg Oral BID  . pantoprazole  40 mg Oral Daily  . pramipexole  0.25 mg Oral QHS  . prednisoLONE acetate  1 drop Right Eye QID   Continuous Infusions:   Principal Problem:   CVA (cerebral infarction) Active Problems:   Coronary artery disease   Hypertension   Anemia of other chronic disease   Slurred speech   UTI (urinary tract infection)   Occlusion and stenosis of carotid artery with cerebral infarction    Time spent: 35 minutes    Maeson Purohit M.D. [facility] Pager [phone]. If 7PM-7AM, please contact night-coverage at [email or web address], password [email or web address] 10/04/2013, 11:48 AM  LOS: 4 days

## 2013-10-04 NOTE — Progress Notes (Signed)
UR COMPLETED  

## 2013-10-05 DIAGNOSIS — N179 Acute kidney failure, unspecified: Secondary | ICD-10-CM

## 2013-10-05 LAB — BASIC METABOLIC PANEL
BUN: 18 mg/dL (ref 6–23)
CALCIUM: 9.2 mg/dL (ref 8.4–10.5)
CO2: 28 mEq/L (ref 19–32)
CREATININE: 0.87 mg/dL (ref 0.50–1.10)
Chloride: 102 mEq/L (ref 96–112)
GFR, EST AFRICAN AMERICAN: 71 mL/min — AB (ref >90)
GFR, EST NON AFRICAN AMERICAN: 61 mL/min — AB (ref >90)
GLUCOSE: 97 mg/dL (ref 70–99)
Potassium: 4.1 mEq/L (ref 3.7–5.3)
Sodium: 140 mEq/L (ref 137–147)

## 2013-10-05 MED ORDER — WHITE PETROLATUM GEL
Status: AC
  Administered 2013-10-05: 06:00:00
  Filled 2013-10-05: qty 5

## 2013-10-05 NOTE — Consult Note (Signed)
Wound care (coccyx). Clean with normal saline the entire area, barrier used on the outside of wound, calcium alginate placed in the area, covered with 4 x 4 and abdominal dressing. Inside looks pinkish, yellowish, no odor, no drainage and patient very cooperative. Sherrie SportMary Sharlize Hoar, SN [facility]

## 2013-10-05 NOTE — Progress Notes (Signed)
[facility] PROGRESS NOTE  [name] WUJ:[id number] DOB: 03-18-1931 DOA: 09/30/2013 PCP: Toni Divine, MD  Assessment/Plan: acute CVA The MRI of the head with acute left MCA infarct. CT angio of head and neck with 80-90% LICA stenosis. Continue ASA for secondary stroke prevention. 2-D echo with EF 40-45%, with moderate hypokinesis of inferolateral, inferiror, and inferoseptal myocardium. pending. Vascular surgery has been consulted per neurology for evaluation of left internal carotid artery stenosis and recommend stent vs CEA in 4-6 weeks. Neurology is following and appreciate input and recommendations.   left ICA stenosis( 80-90% LICA) Vascular surgery ff and recommend stent vs CEA in 4-6 weeks.  abnormal 2-D echo 2-D echo during this hospitalization an EF= 40-45% with wall motion abnormalities. Spoke with Dr. Anne Diaz of [facility] cardiology yesterday, who looked up records from the [place] office of prior 2-D echo done on the patient. Records indicate that on 09/09/2012 patient had a 2-D echo done with a normal ejection fraction mild LVH and mild TR/MR. Also mild dilated left atrium. Patient also noted to have a cardiac catheterization in 2008 that showed a 95% RCA stenosis as well as a 30-40% LAD stenosis which was managed medically. Cardiology has been consulted and ff. D/C'd ACE inhibitor per cardiology rxcs. and recommended outpatient Lexiscan myoview for preop clearance prior to potential carotid surgery.   UTI Urine cultures c/w proteus mirabilis. D/c Cipro.   status post vitrectomy 09/29/2013 Patient has been seen by ophthalmology and recommend continue all eye drops as ordered and followup one week post discharge as outpatient.   history of CHF Stable. Continue metoprolol.    hypertension Stable. Metoprolol.  anxiety Continue anxiolytics as needed.  hyperlipidemia Continue statin.   Code Status: full Family Communication: updated patient at  bedside. Disposition Plan: CIR versus SNF when medically stable.   Consultants:  Neurology:  Ophthalmology:  Cards/vascular  Procedures:  MRI/MRA head 10/01/2013  Chest x-ray 10/01/2013  CT angiogram of head and neck 09/30/2013  Antibiotics:  IV Cipro 10/01/13---> 10/02/13  Oral cipro 10/03/13  HPI/Subjective: No new c/o today  Objective: Filed Vitals:   10/05/13 1137  BP: 130/52  Pulse: 70  Temp:   Resp:     Intake/Output Summary (Last 24 hours) at 10/05/13 1302 Last data filed at 10/05/13 0924  Gross per 24 hour  Intake    480 ml  Output      0 ml  Net    480 ml   Filed Weights   10/03/13 0953 10/04/13 0500 10/05/13 0507  Weight: 76.7 kg (169 lb 1.5 oz) 76.6 kg (168 lb 14 oz) 73.7 kg (162 lb 7.7 oz)    Exam:   General:  NAD  Cardiovascular: RRR  Respiratory: CTAB  Abdomen: soft, nontender, nondistended, positive bowel sounds.  Musculoskeletal: no clubbing cyanosis or edema  Data Reviewed: Basic Metabolic Panel:  Recent Labs Lab 09/30/13 1102 09/30/13 1111 10/02/13 0555 10/03/13 0419 10/04/13 0604 10/05/13 0441  NA 139 140 139 138 140 140  K 4.3 4.2 4.4 4.2 4.2 4.1  CL 101 101 100 100 101 102  CO2 27  --  28 30 28 28   GLUCOSE 98 99 99 85 89 97  BUN 29* 29* 23 20 18 18   CREATININE 0.82 1.00 0.87 0.79 0.84 0.87  CALCIUM 9.6  --  9.1 9.1 9.2 9.2   Liver Function Tests:  Recent Labs Lab 09/30/13 1102  AST 32  ALT 26  ALKPHOS 137*  BILITOT 0.4  PROT 7.8  ALBUMIN  3.5   No results found for this basename: LIPASE, AMYLASE,  in the last 168 hours No results found for this basename: AMMONIA,  in the last 168 hours CBC:  Recent Labs Lab 09/30/13 1102 09/30/13 1111  WBC 11.7*  --   NEUTROABS 8.3*  --   HGB 10.6* 11.9*  HCT 32.7* 35.0*  MCV 90.8  --   PLT 299  --    Cardiac Enzymes:  Recent Labs Lab 09/30/13 1102  TROPONINI <0.30   BNP (last 3 results)  Recent Labs  10/02/13 0555  PROBNP 2552.0*    CBG:  Recent Labs Lab 09/30/13 1203  GLUCAP 93    Recent Results (from the past 240 hour(s))  URINE CULTURE     Status: None   Collection Time    09/30/13 12:40 PM      Result Value Range Status   Specimen Description URINE, CLEAN CATCH   Final   Special Requests NONE   Final   Culture  Setup Time     Final   Value: 09/30/2013 17:09     Performed at [facility]   Colony Count     Final   Value: 50,000 COLONIES/ML     Performed at [facility]   Culture     Final   Value: PROTEUS MIRABILIS     Performed at [facility]   Report Status 10/02/2013 FINAL   Final   Organism ID, Bacteria PROTEUS MIRABILIS   Final     Studies: No results found.  Scheduled Meds: . aspirin  300 mg Rectal Daily   Or  . aspirin  325 mg Oral Daily  . atorvastatin  40 mg Oral q1800  . ciprofloxacin  1 drop Right Eye QID  . docusate sodium  100 mg Oral BID  . dorzolamide-timolol  1 drop Right Eye BID  . enoxaparin (LOVENOX) injection  40 mg Subcutaneous Q24H  . furosemide  20 mg Oral Daily  . metoprolol  50 mg Oral BID  . pantoprazole  40 mg Oral Daily  . polyethylene glycol  17 g Oral Daily  . pramipexole  0.25 mg Oral QHS  . prednisoLONE acetate  1 drop Right Eye QID   Continuous Infusions:   Principal Problem:   CVA (cerebral infarction) Active Problems:   Coronary artery disease   Hypertension   Anemia of other chronic disease   Slurred speech   UTI (urinary tract infection)   Occlusion and stenosis of carotid artery with cerebral infarction    Time spent: 35 minutes    [name]  [facility] Pager [phone]. If 7PM-7AM, please contact night-coverage at [email or web address], password [email or web address] 10/05/2013, 1:02 PM  LOS: 5 days

## 2013-10-05 NOTE — Consult Note (Signed)
Joene Gelder, SN NCAT/Sonja Wilson EdD 

## 2013-10-05 NOTE — Progress Notes (Signed)
Physical Therapy Treatment Patient Details Name: [name] MRN: [id number] DOB: 03-Nov-1930 Today's Date: 10/05/2013 Time: [id number] PT Time Calculation (min): 26 min  PT Assessment / Plan / Recommendation  History of Present Illness Patient is a 80 y.o. year old female who presents for evaluation of symptomatic left internal carotid stenosis. Pt with aphasia after recent eye procedure.  Some improvement overnight but still not back to baseline.  No obvious motor deficits but has some mild left upper extremity weakness baseline from prior Cspine fracture last spring.    PT Comments   Patient continues to be limited by fatigue. Entered room as patient moaning in bed stating it felt hard to breathe. Sat patient up EOB and breathing was better. RN was notified. Patient able to ambulate to bathroom and back. Continue to recommend comprehensive inpatient rehab (CIR) for post-acute therapy needs.   Follow Up Recommendations  CIR;Supervision/Assistance - 24 hour     Does the patient have the potential to tolerate intense rehabilitation     Barriers to Discharge        Equipment Recommendations  None recommended by PT    Recommendations for Other Services Rehab consult  Frequency Min 4X/week   Progress towards PT Goals Progress towards PT goals: Progressing toward goals  Plan      Precautions / Restrictions Precautions Precautions: Fall Precaution Comments: Patient fearful of falling in standing   Pertinent Vitals/Pain Complained of leg soreness prior to walking but stated subsided after walking.    Mobility  Bed Mobility Bed Mobility: Supine to Sit Supine to sit: Min assist General bed mobility comments: Verbal and tactile cues for technique.  Used bed pad to assist patient to sitting.  Once upright, patient with good sitting balance. Transfers Overall transfer level: Needs assistance Equipment used: Rolling walker (2 wheeled) Sit to Stand: Min assist General transfer  comment: VCs for hand placement and upright posture pt with preflexed fwd head posture) assist for stability to come to upright, VCs for hand placement Ambulation/Gait Ambulation/Gait assistance: Min assist Ambulation Distance (Feet): 30 Feet Assistive device: Rolling walker (2 wheeled) Gait Pattern/deviations: Step-to pattern;Trunk flexed;Shuffle Gait velocity: decreased Gait velocity interpretation: <1.8 ft/sec, indicative of risk for recurrent falls General Gait Details: Patient with very kyphotic posture. Max verbal and tactile cues to stand upright    Exercises     PT Diagnosis:    PT Problem List:   PT Treatment Interventions:     PT Goals (current goals can now be found in the care plan section)    Visit Information  Last PT Received On: 10/05/13 Assistance Needed: +1 History of Present Illness: Patient is a 80 y.o. year old female who presents for evaluation of symptomatic left internal carotid stenosis. Pt with aphasia after recent eye procedure.  Some improvement overnight but still not back to baseline.  No obvious motor deficits but has some mild left upper extremity weakness baseline from prior Cspine fracture last spring.     Subjective Data      Cognition  Cognition Arousal/Alertness: Awake/alert Behavior During Therapy: WFL for tasks assessed/performed Overall Cognitive Status: Difficult to assess    Balance  Balance Sitting balance-Leahy Scale: Good Standing balance-Leahy Scale: Fair  End of Session PT - End of Session Equipment Utilized During Treatment: Gait belt Activity Tolerance: Patient limited by fatigue Patient left: in chair;with call bell/phone within reach;Other (comment) Nurse Communication: Mobility status   GP     Fredrich BirksRobinette, Julia Elizabeth 10/05/2013, 12:03 PM 10/05/2013 Mckinley Jewelobinette, Julia  [place] pager [phone] office

## 2013-10-06 ENCOUNTER — Encounter (HOSPITAL_COMMUNITY): Payer: Self-pay | Admitting: Rehabilitation

## 2013-10-06 ENCOUNTER — Inpatient Hospital Stay (HOSPITAL_COMMUNITY)
Admission: RE | Admit: 2013-10-06 | Discharge: 2013-10-14 | DRG: 945 | Disposition: A | Discharge status: Home Health | Payer: Medicare Other | Source: Intra-hospital | Attending: Physical Medicine & Rehabilitation | Admitting: Physical Medicine & Rehabilitation

## 2013-10-06 DIAGNOSIS — I5020 Unspecified systolic (congestive) heart failure: Secondary | ICD-10-CM | POA: Diagnosis present

## 2013-10-06 DIAGNOSIS — I6529 Occlusion and stenosis of unspecified carotid artery: Secondary | ICD-10-CM | POA: Diagnosis present

## 2013-10-06 DIAGNOSIS — Z96659 Presence of unspecified artificial knee joint: Secondary | ICD-10-CM

## 2013-10-06 DIAGNOSIS — F411 Generalized anxiety disorder: Secondary | ICD-10-CM | POA: Diagnosis present

## 2013-10-06 DIAGNOSIS — Z79899 Other long term (current) drug therapy: Secondary | ICD-10-CM | POA: Diagnosis not present

## 2013-10-06 DIAGNOSIS — R4789 Other speech disturbances: Secondary | ICD-10-CM | POA: Diagnosis present

## 2013-10-06 DIAGNOSIS — I10 Essential (primary) hypertension: Secondary | ICD-10-CM | POA: Diagnosis present

## 2013-10-06 DIAGNOSIS — Z5189 Encounter for other specified aftercare: Secondary | ICD-10-CM | POA: Diagnosis present

## 2013-10-06 DIAGNOSIS — I509 Heart failure, unspecified: Secondary | ICD-10-CM | POA: Diagnosis present

## 2013-10-06 DIAGNOSIS — Z7982 Long term (current) use of aspirin: Secondary | ICD-10-CM | POA: Diagnosis not present

## 2013-10-06 DIAGNOSIS — I2510 Atherosclerotic heart disease of native coronary artery without angina pectoris: Secondary | ICD-10-CM | POA: Diagnosis present

## 2013-10-06 DIAGNOSIS — R4701 Aphasia: Secondary | ICD-10-CM | POA: Diagnosis present

## 2013-10-06 DIAGNOSIS — L8993 Pressure ulcer of unspecified site, stage 3: Secondary | ICD-10-CM | POA: Diagnosis present

## 2013-10-06 DIAGNOSIS — M79609 Pain in unspecified limb: Secondary | ICD-10-CM | POA: Diagnosis present

## 2013-10-06 DIAGNOSIS — I639 Cerebral infarction, unspecified: Secondary | ICD-10-CM | POA: Diagnosis present

## 2013-10-06 DIAGNOSIS — L89899 Pressure ulcer of other site, unspecified stage: Secondary | ICD-10-CM | POA: Diagnosis present

## 2013-10-06 DIAGNOSIS — I6350 Cerebral infarction due to unspecified occlusion or stenosis of unspecified cerebral artery: Secondary | ICD-10-CM | POA: Diagnosis present

## 2013-10-06 DIAGNOSIS — E785 Hyperlipidemia, unspecified: Secondary | ICD-10-CM | POA: Diagnosis present

## 2013-10-06 DIAGNOSIS — G2581 Restless legs syndrome: Secondary | ICD-10-CM | POA: Diagnosis present

## 2013-10-06 DIAGNOSIS — G8110 Spastic hemiplegia affecting unspecified side: Secondary | ICD-10-CM

## 2013-10-06 DIAGNOSIS — I6330 Cerebral infarction due to thrombosis of unspecified cerebral artery: Secondary | ICD-10-CM

## 2013-10-06 LAB — BASIC METABOLIC PANEL
BUN: 21 mg/dL (ref 6–23)
CALCIUM: 9.4 mg/dL (ref 8.4–10.5)
CO2: 27 mEq/L (ref 19–32)
CREATININE: 0.82 mg/dL (ref 0.50–1.10)
Chloride: 103 mEq/L (ref 96–112)
GFR calc non Af Amer: 66 mL/min — ABNORMAL LOW (ref >90)
GFR, EST AFRICAN AMERICAN: 76 mL/min — AB (ref >90)
Glucose, Bld: 89 mg/dL (ref 70–99)
Potassium: 4.4 mEq/L (ref 3.7–5.3)
Sodium: 141 mEq/L (ref 137–147)

## 2013-10-06 LAB — CREATININE, SERUM
Creatinine, Ser: 0.9 mg/dL (ref 0.50–1.10)
GFR calc Af Amer: 68 mL/min — ABNORMAL LOW (ref >90)
GFR, EST NON AFRICAN AMERICAN: 59 mL/min — AB (ref >90)

## 2013-10-06 LAB — CBC
HEMATOCRIT: 33.8 % — AB (ref 36.0–46.0)
HEMOGLOBIN: 10.9 g/dL — AB (ref 12.0–15.0)
MCH: 29.5 pg (ref 26.0–34.0)
MCHC: 32.2 g/dL (ref 30.0–36.0)
MCV: 91.4 fL (ref 78.0–100.0)
Platelets: 289 10*3/uL (ref 150–400)
RBC: 3.7 MIL/uL — ABNORMAL LOW (ref 3.87–5.11)
RDW: 15.6 % — ABNORMAL HIGH (ref 11.5–15.5)
WBC: 8.5 10*3/uL (ref 4.0–10.5)

## 2013-10-06 MED ORDER — PRAMIPEXOLE DIHYDROCHLORIDE 0.25 MG PO TABS
0.2500 mg | ORAL_TABLET | Freq: Every day | ORAL | Status: DC
  Administered 2013-10-06 – 2013-10-07 (×2): 0.25 mg via ORAL
  Filled 2013-10-06 (×3): qty 1

## 2013-10-06 MED ORDER — DSS 100 MG PO CAPS: 100.0000 mg | ORAL_CAPSULE | Freq: Two times a day (BID) | ORAL | Status: DC

## 2013-10-06 MED ORDER — PREDNISOLONE ACETATE 1 % OP SUSP: 1.0000 [drp] | Freq: Four times a day (QID) | OPHTHALMIC | Status: DC

## 2013-10-06 MED ORDER — PRAMIPEXOLE DIHYDROCHLORIDE 0.25 MG PO TABS
0.2500 mg | ORAL_TABLET | Freq: Three times a day (TID) | ORAL | Status: DC | PRN
  Administered 2013-10-08 – 2013-10-13 (×5): 0.25 mg via ORAL
  Filled 2013-10-06 (×7): qty 1

## 2013-10-06 MED ORDER — ONDANSETRON HCL 4 MG PO TABS
4.0000 mg | ORAL_TABLET | Freq: Four times a day (QID) | ORAL | Status: DC | PRN
Start: 2013-10-06 — End: 2013-10-14
  Administered 2013-10-09: 4 mg via ORAL
  Filled 2013-10-06: qty 1

## 2013-10-06 MED ORDER — DICLOFENAC SODIUM 1 % TD GEL
2.0000 g | Freq: Four times a day (QID) | TRANSDERMAL | Status: DC | PRN
  Administered 2013-10-09 – 2013-10-14 (×5): 2 g via TOPICAL
  Filled 2013-10-06: qty 100

## 2013-10-06 MED ORDER — PREDNISOLONE ACETATE 1 % OP SUSP
1.0000 [drp] | Freq: Four times a day (QID) | OPHTHALMIC | Status: DC
  Administered 2013-10-06 – 2013-10-12 (×21): 1 [drp] via OPHTHALMIC
  Filled 2013-10-06: qty 1

## 2013-10-06 MED ORDER — CIPROFLOXACIN HCL 0.3 % OP SOLN
1.0000 [drp] | Freq: Four times a day (QID) | OPHTHALMIC | Status: DC
  Administered 2013-10-06 – 2013-10-12 (×21): 1 [drp] via OPHTHALMIC
  Filled 2013-10-06: qty 2.5

## 2013-10-06 MED ORDER — ACETAMINOPHEN 325 MG PO TABS
650.0000 mg | ORAL_TABLET | ORAL | Status: DC | PRN
  Administered 2013-10-07 – 2013-10-13 (×6): 650 mg via ORAL
  Filled 2013-10-06 (×7): qty 2

## 2013-10-06 MED ORDER — METOPROLOL TARTRATE 50 MG PO TABS
50.0000 mg | ORAL_TABLET | Freq: Two times a day (BID) | ORAL | Status: DC
  Administered 2013-10-06 – 2013-10-14 (×16): 50 mg via ORAL
  Filled 2013-10-06 (×18): qty 1

## 2013-10-06 MED ORDER — FUROSEMIDE 20 MG PO TABS: 20.0000 mg | ORAL_TABLET | Freq: Every day | ORAL | Status: DC

## 2013-10-06 MED ORDER — PRAMIPEXOLE DIHYDROCHLORIDE 0.25 MG PO TABS: 0.2500 mg | ORAL_TABLET | Freq: Three times a day (TID) | ORAL | Status: DC | PRN

## 2013-10-06 MED ORDER — DOCUSATE SODIUM 100 MG PO CAPS
100.0000 mg | ORAL_CAPSULE | Freq: Two times a day (BID) | ORAL | Status: DC
  Administered 2013-10-06 – 2013-10-14 (×15): 100 mg via ORAL
  Filled 2013-10-06 (×19): qty 1

## 2013-10-06 MED ORDER — ATORVASTATIN CALCIUM 40 MG PO TABS: 40.0000 mg | ORAL_TABLET | Freq: Every day | ORAL | Status: DC

## 2013-10-06 MED ORDER — DICLOFENAC SODIUM 1 % TD GEL: 2.0000 g | Freq: Four times a day (QID) | TRANSDERMAL | Status: DC | PRN

## 2013-10-06 MED ORDER — ENOXAPARIN SODIUM 40 MG/0.4ML ~~LOC~~ SOLN: 40.0000 mg | SUBCUTANEOUS | Status: DC

## 2013-10-06 MED ORDER — FUROSEMIDE 20 MG PO TABS
20.0000 mg | ORAL_TABLET | Freq: Every day | ORAL | Status: DC
  Administered 2013-10-07 – 2013-10-14 (×8): 20 mg via ORAL
  Filled 2013-10-06 (×9): qty 1

## 2013-10-06 MED ORDER — POLYETHYLENE GLYCOL 3350 17 G PO PACK: 17.0000 g | PACK | Freq: Every day | ORAL | Status: DC

## 2013-10-06 MED ORDER — ENOXAPARIN SODIUM 40 MG/0.4ML ~~LOC~~ SOLN
40.0000 mg | SUBCUTANEOUS | Status: DC
  Administered 2013-10-06 – 2013-10-13 (×8): 40 mg via SUBCUTANEOUS
  Filled 2013-10-06 (×9): qty 0.4

## 2013-10-06 MED ORDER — ONDANSETRON HCL 4 MG/2ML IJ SOLN: 4.0000 mg | Freq: Four times a day (QID) | INTRAMUSCULAR | Status: DC | PRN

## 2013-10-06 MED ORDER — ASPIRIN 325 MG PO TABS: 325.0000 mg | ORAL_TABLET | Freq: Every day | ORAL | Status: DC

## 2013-10-06 MED ORDER — PANTOPRAZOLE SODIUM 40 MG PO TBEC: 40.0000 mg | DELAYED_RELEASE_TABLET | Freq: Every day | ORAL | Status: DC

## 2013-10-06 MED ORDER — DORZOLAMIDE HCL-TIMOLOL MAL 2-0.5 % OP SOLN: 1.0000 [drp] | Freq: Two times a day (BID) | OPHTHALMIC | Status: DC

## 2013-10-06 MED ORDER — CIPROFLOXACIN HCL 0.3 % OP SOLN
1.0000 [drp] | Freq: Four times a day (QID) | OPHTHALMIC | Status: DC
Start: 2013-10-06 — End: 2013-10-14

## 2013-10-06 MED ORDER — ALPRAZOLAM 0.25 MG PO TABS
0.2500 mg | ORAL_TABLET | Freq: Every evening | ORAL | Status: DC | PRN
  Administered 2013-10-07 – 2013-10-13 (×5): 0.25 mg via ORAL
  Filled 2013-10-06 (×5): qty 1

## 2013-10-06 MED ORDER — SORBITOL 70 % SOLN: 30.0000 mL | Freq: Every day | Status: DC | PRN

## 2013-10-06 MED ORDER — ASPIRIN 300 MG RE SUPP
300.0000 mg | Freq: Every day | RECTAL | Status: DC
  Filled 2013-10-06 (×9): qty 1

## 2013-10-06 MED ORDER — ASPIRIN 325 MG PO TABS
325.0000 mg | ORAL_TABLET | Freq: Every day | ORAL | Status: DC
  Administered 2013-10-07 – 2013-10-14 (×8): 325 mg via ORAL
  Filled 2013-10-06 (×9): qty 1

## 2013-10-06 MED ORDER — ATORVASTATIN CALCIUM 40 MG PO TABS
40.0000 mg | ORAL_TABLET | Freq: Every day | ORAL | Status: DC
  Administered 2013-10-07 – 2013-10-13 (×7): 40 mg via ORAL
  Filled 2013-10-06 (×8): qty 1

## 2013-10-06 MED ORDER — PANTOPRAZOLE SODIUM 40 MG PO TBEC
40.0000 mg | DELAYED_RELEASE_TABLET | Freq: Every day | ORAL | Status: DC
  Administered 2013-10-07 – 2013-10-14 (×8): 40 mg via ORAL
  Filled 2013-10-06 (×10): qty 1

## 2013-10-06 MED ORDER — DORZOLAMIDE HCL-TIMOLOL MAL 2-0.5 % OP SOLN
1.0000 [drp] | Freq: Two times a day (BID) | OPHTHALMIC | Status: DC
  Administered 2013-10-06 – 2013-10-14 (×16): 1 [drp] via OPHTHALMIC
  Filled 2013-10-06: qty 10

## 2013-10-06 MED ORDER — POLYETHYLENE GLYCOL 3350 17 G PO PACK
17.0000 g | PACK | Freq: Every day | ORAL | Status: DC
  Administered 2013-10-07 – 2013-10-12 (×6): 17 g via ORAL
  Filled 2013-10-06 (×9): qty 1

## 2013-10-06 NOTE — Progress Notes (Signed)
Speech Language Pathology Treatment: Cognitive-Linquistic  Patient Details Name: [name] MRN: [id number] DOB: [birth year] Today's Date: 10/06/2013 Time: 1115-1205 SLP Time Calculation (min): 50 min  Assessment / Plan / Recommendation Clinical Impression  Treatment focus on expressive language goals. SLP facilitated session by providing extra time and Min-Mod A question and verbal cues to self-monitor and correct verbal errors during a complex conversation about abstract thoughts/topics. Pt also required Min question cues to recall and utilize word-finding strategies throughout the session. Pt participated in basic written expression task of writing biographical information at the word level. Pt required Min verbal and visual cues to self-monitor and correct verbal errors throughout task. Pt's overall verbal expression is improving but requires cueing to decrease verbosity, maintain topic of conversation and turn taking. Continue plan of care.    HPI HPI: Pt. is an 80 y.o. female with a PMH: HTN, CAD, dyslipidemia who underwent vitrectomy on 09/29/2013, presented to her ophthalmologist office for a followup visit and developed sudden onset mental status changes at the Ophthalmology office, as initially having a blank stare and noted to be confused and disoriented, unable to follow commands or speak. There was no loss of consciousness during this time. After this she was noted to have slurred speech and difficulties getting words out. In the emergency room she is able to follow commands however continues to have difficulties with getting words out. Initial CT scan of brain did not show acute intracranial abnormalities.   Pertinent Vitals N/A  SLP Plan  Continue with current plan of care    Recommendations     Follow up Recommendations: Inpatient Rehab;24 hour supervision/assistance Plan: Continue with current plan of care    GO     [name] 10/06/2013, 12:12 PM

## 2013-10-06 NOTE — Progress Notes (Signed)
Patient ID: [name], female   DOB: Jan 24, 1931, 80 y.o.   MRN: [id number] Patient admitted to room [id number] via wheelchair, escorted by nursing staff and daughter.  Patient verbalized understanding of rehab process, given and reviewed rehab notebook.  VSS.  Appears to be in no immediate distress at this time.  Dani Gobbleeardon, Clee Pandit J, RN

## 2013-10-06 NOTE — Progress Notes (Signed)
Continue to have difficulty with insurance approval for inpatient rehab/ patient's daughter [name] [phone]) [facility], [name] wants the pt to go to rehab and stated that she will call the insurance company herself. If no answer from the insurance carrier [facility]; Alexis GoodellB Chandra Feger RN,BSN,MHA [phone]

## 2013-10-06 NOTE — Progress Notes (Signed)
Physical Therapy Treatment Patient Details Name: Toni PartridgeMayfie A Diaz MRN: [id number] DOB: [birth year] Today's Date: 10/06/2013 Time: [id number] PT Time Calculation (min): 23 min  PT Assessment / Plan / Recommendation  History of Present Illness Patient is a 80 y.o. year old female who presents for evaluation of symptomatic left internal carotid stenosis. Pt with aphasia after recent eye procedure.  Some improvement overnight but still not back to baseline.  No obvious motor deficits but has some mild left upper extremity weakness baseline from prior Cspine fracture last spring.    PT Comments   Patient progressing well with overall mobility. Patient appeared to have increased energy today compared to yesterday. Patient very motivated to ambulate and work with therapy. Continue to recommend comprehensive inpatient rehab (CIR) for post-acute therapy needs.   Follow Up Recommendations  CIR;Supervision/Assistance - 24 hour     Does the patient have the potential to tolerate intense rehabilitation     Barriers to Discharge        Equipment Recommendations  None recommended by PT    Recommendations for Other Services    Frequency Min 4X/week   Progress towards PT Goals Progress towards PT goals: Progressing toward goals  Plan Current plan remains appropriate    Precautions / Restrictions Precautions Precautions: Fall   Pertinent Vitals/Pain no apparent distress     Mobility  Bed Mobility Bed Mobility: Supine to Sit Supine to sit: Min assist General bed mobility comments: Verbal and tactile cues for technique. A for trunk Transfers Equipment used: Rolling walker (2 wheeled) Transfers: Sit to/from Stand Sit to Stand: Min assist General transfer comment: VCs for hand placement and upright posture pt with preflexed fwd head posture) assist for stability to come to upright, VCs for hand placement Ambulation/Gait Ambulation/Gait assistance: Min assist Ambulation Distance (Feet): 40  Feet Assistive device: Rolling walker (2 wheeled) Gait Pattern/deviations: Step-to pattern;Trunk flexed Gait velocity: decreased Gait velocity interpretation: <1.8 ft/sec, indicative of risk for recurrent falls General Gait Details: Patient with very kyphotic posture. Max verbal and tactile cues to stand upright. A with turns with use of RW    Exercises General Exercises - Lower Extremity Long Arc Quad: AROM;Both;20 reps Hip Flexion/Marching: AROM;Both;20 reps   PT Diagnosis:    PT Problem List:   PT Treatment Interventions:     PT Goals (current goals can now be found in the care plan section)    Visit Information  Last PT Received On: 10/06/13 Assistance Needed: +1 History of Present Illness: Patient is a 80 y.o. year old female who presents for evaluation of symptomatic left internal carotid stenosis. Pt with aphasia after recent eye procedure.  Some improvement overnight but still not back to baseline.  No obvious motor deficits but has some mild left upper extremity weakness baseline from prior Cspine fracture last spring.     Subjective Data      Cognition  Cognition Arousal/Alertness: Awake/alert Behavior During Therapy: WFL for tasks assessed/performed Overall Cognitive Status: Within Functional Limits for tasks assessed    Balance     End of Session PT - End of Session Equipment Utilized During Treatment: Gait belt Activity Tolerance: Patient tolerated treatment well;Patient limited by fatigue Patient left: in chair;with call bell/phone within reach Nurse Communication: Mobility status   GP     Toni BirksRobinette, [name] 10/06/2013, 11:38 AM 10/06/2013 Toni Birksobinette, [name] PTA [phone] pager [phone] office

## 2013-10-06 NOTE — Progress Notes (Signed)
Report given to Rehab unit.

## 2013-10-06 NOTE — Progress Notes (Signed)
Rehab admissions - [facility], and have approval for acute inpatient rehab admission.  Bed available and will admit to acute inpatient rehab today.  Call me for questions.  #[phone]

## 2013-10-06 NOTE — H&P (Signed)
Physical Medicine and Rehabilitation Admission H&P    Chief Complaint  Patient presents with  . Code Stroke  :  Chief complaint: Weakness  HPI: [name] is a 80 y.o. RH-female with history of CHF, CAD, C2 dens fracture with left sided weakness; who underwent vitrectomy on 09/29/13 and on follow up in office next day developed acute onset of confusion and inability to talk. CT head without acute changes. CTA head/neck with 80-90% heavily calcified left internal carotid artery stenosis with potentially flow reduction and nNo intracranial stenosis, occlusion, or visible acute infarct. MRI brain done revealing acute left MCA infarct predominantly involving the temporoparietal region. 2D echo with EF 40-45% with moderate hypokinesis, grade 2 diastolic dysfunction and mild to moderate MVR. Dr Oneida Alar consulted and recommends stent v/s CEA in 4-6 weeks for symptomatic carotid stenosis. Dr. Wynonia Lawman consulted for input on echo and recommended medical management as patient without clinical HF. Neurology recommended increasing ASA to 325 mg daily for stroke due to L-CAS. Maintained on subcutaneous Lovenox for DVT prophylaxis. She is tolerating a regular diet. WOC consulted for input on stage III cocygeal ulcer and calcium alginate ordered for treatment. Physical occupational therapy evaluations completed an ongoing with recommendations for physical medicine rehabilitation consult to consider inpatient rehabilitation services. Patient was admitted for comprehensive rehabilitation program  After patient had C2 dens fracture in April of 2014, she went to skilled nursing facility for 100 days. Coccygeal decubitus occurred in the skilled nursing facility. Has been going to outpatient wound center since discharge to home. Also sees Dr. Suella Broad for her spinal stenosis   ROS Review of Systems  Eyes: Positive for blurred vision (right eye).  Gastrointestinal: Negative for heartburn and nausea.   Musculoskeletal: Positive for myalgias.  Neurological: Positive for speech change and focal weakness (LUE weakness. ). Negative for headaches All other systems negative  Past Medical History  Diagnosis Date  . CHF (congestive heart failure)   . Coronary artery disease   . Hypertension   . Arthritis   . Edema of extremities   . Anxiety   . HLD (hyperlipidemia)    Past Surgical History  Procedure Laterality Date  . Total knee arthroplasty Bilateral     knee replacements  . Lumbar disc surgery    . Cholecystectomy    . Posterior cervical fusion/foraminotomy N/A 12/28/2012    Procedure: POSTERIOR CERVICAL FUSION/FORAMINOTOMY LEVEL 3;  Surgeon: Kristeen Miss, MD;  Location: [facility];  Service: Neurosurgery;  Laterality: N/A;  Posterior Cervical Three-Six Laminectomy and Fusion  . Angioplasty    . Tonsillectomy     Family History  Problem Relation Age of Onset  . Dementia Sister     x 3  . CAD Brother   . Breast cancer Sister     x 3  . Heart disease Sister   . Heart disease Son     x 2  . Alzheimer's disease Sister    Social History:  reports that she has never smoked. She has never used smokeless tobacco. She reports that she drinks alcohol. She reports that she does not use illicit drugs. Allergies:  Allergies  Allergen Reactions  . Penicillins Shortness Of Breath    Throat closed up and had hives and nausea   Medications Prior to Admission  Medication Sig Dispense Refill  . ALPRAZolam (XANAX) 0.25 MG tablet Take 1 tablet every night at bedtime for anxiety  30 tablet  5  . aspirin EC 81 MG tablet Take 81  mg by mouth daily.      Marland Kitchen HYDROcodone-acetaminophen (NORCO) 10-325 MG per tablet Take 1 tablets every 6 hours as needed for mild pain(1-4) Take 2 tablets every 6 hours as needed for moderate to severe pain(5-10).  240 tablet  5  . methocarbamol (ROBAXIN) 500 MG tablet Take 1 tablet (500 mg total) by mouth every 6 (six) hours as needed.  30 tablet  0  . metoprolol  (LOPRESSOR) 50 MG tablet Take 50 mg by mouth 2 (two) times daily.      . Multiple Vitamin (MULTIVITAMIN WITH MINERALS) TABS Take 1 tablet by mouth daily.      . pramipexole (MIRAPEX) 0.125 MG tablet Take 0.25 mg by mouth at bedtime.      . pravastatin (PRAVACHOL) 80 MG tablet Take 80 mg by mouth daily at 6 PM.        Home: Home Living Family/patient expects to be discharged to:: Inpatient rehab Living Arrangements: Children Available Help at Discharge: Available 24 hours/day;Family;Home health Type of Home: House Home Access: Ramped entrance Home Layout: One level Home Equipment: Walker - 2 wheels;Shower seat;Wheelchair - [employer]  Lives With: Family;Other (Comment)   Functional History:    Functional Status:  Mobility:     Ambulation/Gait Ambulation Distance (Feet): 30 Feet Gait velocity: decreased General Gait Details: Patient with very kyphotic posture. Max verbal and tactile cues to stand upright    ADL: ADL Eating/Feeding: Simulated;Set up Where Assessed - Eating/Feeding: Edge of bed Grooming: Performed;Wash/dry face;Wash/dry hands;Set up Where Assessed - Grooming: Unsupported sitting Upper Body Bathing: Simulated;Set up Where Assessed - Upper Body Bathing: Unsupported sitting Lower Body Bathing: Minimal assistance;Simulated Where Assessed - Lower Body Bathing: Unsupported standing;Supported standing Upper Body Dressing: Performed;Moderate assistance Where Assessed - Upper Body Dressing: Unsupported sitting Lower Body Dressing: Performed;Moderate assistance Where Assessed - Lower Body Dressing: Unsupported sitting;Supported standing Toilet Transfer: Simulated;Minimal assistance Toilet Transfer Method: Stand pivot ADL Comments: Pt. has decreased ROM of L SHLD since last April when pt. had fall. Pt. has decreased ability to use L UE for dressing and grooming tasks secondary to decreased ROM.   Cognition: Cognition Overall Cognitive Status: Difficult  to assess Arousal/Alertness: Awake/alert Orientation Level: Oriented X4 Attention: Selective Selective Attention: Appears intact Memory: Appears intact Awareness: Appears intact Problem Solving: Appears intact Executive Function: Reasoning;Decision Making Reasoning: Appears intact Decision Making: Appears intact Safety/Judgment: Appears intact Cognition Arousal/Alertness: Awake/alert Behavior During Therapy: WFL for tasks assessed/performed Overall Cognitive Status: Difficult to assess Difficult to assess due to:  (communication)  Physical Exam: Blood pressure 155/45, pulse 64, temperature 98.5 F (36.9 C), temperature source Oral, resp. rate 18, height '5\' 3"'  (1.6 m), weight 73.7 kg (162 lb 7.7 oz), SpO2 99.00%. Physical Exam Constitutional: She is oriented to person, place, and time. She appears well-developed and well-nourished.  HENT:  Head: Normocephalic and atraumatic.  Neck: Normal range of motion.  Cardiovascular: Normal rate and regular rhythm.  No murmur heard.  Respiratory: Effort normal and breath sounds normal. No respiratory distress. She has no wheezes.  GI: Soft. Bowel sounds are normal. She exhibits no distension. There is no tenderness.  Musculoskeletal:  Bilateral thenar atrophy. Decrease in fine motor movements left side.  Neurological: She is alert and oriented to person, place, and time. A cranial nerve deficit is present.  Expressive deficits with word finding deficits. Apraxic. Unable to follow two step commands without max cues. RIght limb ataxia and mild weakness, 3+ to 4-/5. RUE more involved than lower. (inconsistent). Decreased awareness  and insight.  Skin: Skin is warm and dry.   sensory exam is reduced in both feet. To light touch Grade 3 decubitus coccyx 2 cm diameter, no drainage, no order, good granulation tissue  Results for orders placed during the hospital encounter of 09/30/13 (from the past 48 hour(s))  BASIC METABOLIC PANEL     Status:  Abnormal   Collection Time    10/05/13  4:41 AM      Result Value Range   Sodium 140  137 - 147 mEq/L   Potassium 4.1  3.7 - 5.3 mEq/L   Chloride 102  96 - 112 mEq/L   CO2 28  19 - 32 mEq/L   Glucose, Bld 97  70 - 99 mg/dL   BUN 18  6 - 23 mg/dL   Creatinine, Ser 0.87  0.50 - 1.10 mg/dL   Calcium 9.2  8.4 - 10.5 mg/dL   GFR calc non Af Amer 61 (*) >90 mL/min   GFR calc Af Amer 71 (*) >90 mL/min   Comment: (NOTE)     The eGFR has been calculated using the CKD EPI equation.     This calculation has not been validated in all clinical situations.     eGFR's persistently <90 mL/min signify possible Chronic Kidney     Disease.  BASIC METABOLIC PANEL     Status: Abnormal   Collection Time    10/06/13  5:50 AM      Result Value Range   Sodium 141  137 - 147 mEq/L   Potassium 4.4  3.7 - 5.3 mEq/L   Chloride 103  96 - 112 mEq/L   CO2 27  19 - 32 mEq/L   Glucose, Bld 89  70 - 99 mg/dL   BUN 21  6 - 23 mg/dL   Creatinine, Ser 0.82  0.50 - 1.10 mg/dL   Calcium 9.4  8.4 - 10.5 mg/dL   GFR calc non Af Amer 66 (*) >90 mL/min   GFR calc Af Amer 76 (*) >90 mL/min   Comment: (NOTE)     The eGFR has been calculated using the CKD EPI equation.     This calculation has not been validated in all clinical situations.     eGFR's persistently <90 mL/min signify possible Chronic Kidney     Disease.   No results found.  Post Admission Physician Evaluation: 1. Functional deficits secondary  to left temporal parietal infarct with right upper extremity fine motor deficits as well as decreased balance.. 2. Patient is admitted to receive collaborative, interdisciplinary care between the physiatrist, rehab nursing staff, and therapy team. 3. Patient's level of medical complexity and substantial therapy needs in context of that medical necessity cannot be provided at a lesser intensity of care such as a SNF. 4. Patient has experienced substantial functional loss from his/her baseline which was documented  above under the "Functional History" and "Functional Status" headings.  Judging by the patient's diagnosis, physical exam, and functional history, the patient has potential for functional progress which will result in measurable gains while on inpatient rehab.  These gains will be of substantial and practical use upon discharge  in facilitating mobility and self-care at the household level. 5. Physiatrist will provide 24 hour management of medical needs as well as oversight of the therapy plan/treatment and provide guidance as appropriate regarding the interaction of the two. 6. 24 hour rehab nursing will assist with bladder management, bowel management, safety, skin/wound care, disease management, medication administration, pain management  and patient education  and help integrate therapy concepts, techniques,education, etc. 7. PT will assess and treat for/with: pre gait, gait training, endurance , safety, equipment, neuromuscular re education.   Goals are:  supervision . 8. OT will assess and treat for/with: ADLs, Cognitive perceptual skills, Neuromuscular re education, safety, endurance, equipment.   Goals are:  supervision. 9. SLP will assess and treat for/with:  eval language .  Goals are:  reduce word finding error . 10. Case Management and Social Worker will assess and treat for psychological issues and discharge planning. 11. Team conference will be held weekly to assess progress toward goals and to determine barriers to discharge. 12. Patient will receive at least 3 hours of therapy per day at least 5 days per week. 13. ELOS:  10-14 days        14. Prognosis:  excellent   Medical Problem List and Plan: 1. Acute left temporoparietal infarct secondary to left carotid stenosis 2. DVT Prophylaxis/Anticoagulation: Subcutaneous Lovenox. Monitor platelet counts and any signs of bleeding 3. Pain Management: Tylenol as needed.Voltaren Gel as needed for knee pain 4. Mood/anxiety: Xanax 0.25 mg each  bedtime as needed 5. Neuropsych: This patient  is  capable of making decisions on  her own behalf. 6. Left ICA stenosis. Followup vascular surgery/Dr. Oneida Alar outpatient question stent versus CEA 4-6 weeks 7. CAD/LVD. Followup cardiology services plan [place] and followup appointment second week in February as outpatient 8. Hypertension. Lopressor 50 mg twice a day. Monitor with increased mobility 9. Systolic congestive heart failure. Lasix 20 mg daily. Monitor for any signs of fluid overload 10. Vitrectomy 09/29/2013. Followup ophthalmology services 11. Stage III cocygeal ulcer. Wound care nurse followup. Skin care as directed 12. Hyperlipidemia. Lipitor  Charlett Blake M.D. [facility] FAAPM&R (Sports Med, Neuromuscular Med) Diplomate Am Board of Electrodiagnostic Med Diplomate Am Board of Pain Medicine Fellow Am Board of Interventional Pain Physicians 10/06/2013

## 2013-10-06 NOTE — Discharge Summary (Signed)
Physician Discharge Summary  [name] ZOX:[id number] DOB: 1931/08/03 DOA: 09/30/2013  PCP: [name], MD  Admit date: 09/30/2013 Discharge date: 10/06/2013  Time spent: 35 minutes  Recommendations for Outpatient Follow-up:  1. Vascular surgery follow up 2. opthalmology follow up 1 week after d./c  Discharge Diagnoses:  Principal Problem:   CVA (cerebral infarction) Active Problems:   Coronary artery disease   Hypertension   Anemia of other chronic disease   Slurred speech   UTI (urinary tract infection)   Occlusion and stenosis of carotid artery with cerebral infarction   Discharge Condition: improved  Diet recommendation: heart healthy  Filed Weights   10/03/13 0953 10/04/13 0500 10/05/13 0507  Weight: 76.7 kg (169 lb 1.5 oz) 76.6 kg (168 lb 14 oz) 73.7 kg (162 lb 7.7 oz)    History of present illness:  [name] is a 80 y.o. female with a past medical history of hypertension, coronary artery disease, dyslipidemia who underwent vitrectomy on 09/29/2013, presenting to her ophthalmologist office today for a followup visit. She had otherwise been in her usual state health, when she developed sudden onset mental status changes at the Ophthalmology office, described by her family members as initially having a blank stare. This occurred for 20-30 seconds after which she was noted to be confused and disoriented, unable to follow commands or speak. There was no loss of consciousness during this time. After this she was noted to have slurred speech and difficulties getting words out. In the emergency room she is able to follow commands however continues to have difficulties with getting words out. Initial CT scan of brain did not show acute intracranial abnormalities. Patient was seen and evaluate by neurology while in the emergency department. She had been on aspirin 81 mg by mouth daily prior to this. She does not have a history of CVA or TIA. She denies shortness  of breath, cough, fevers, chills, nausea, vomiting, head trauma, falls, prior history of encephalopathy, abdominal pain, Dysuria, Hematuria, or lower extremity swelling  Hospital Course:  acute CVA  The MRI of the head with acute left MCA infarct. CT angio of head and neck with 80-90% LICA stenosis. Continue ASA for secondary stroke prevention. 2-D echo with EF 40-45%, with moderate hypokinesis of inferolateral, inferiror, and inferoseptal myocardium. pending. Vascular surgery has been consulted per neurology for evaluation of left internal carotid artery stenosis and recommend stent vs CEA in 4-6 weeks.  left ICA stenosis( 80-90% LICA)  Vascular surgery ff and recommend stent vs CEA in 4-6 weeks.   abnormal 2-D echo  2-D echo during this hospitalization an EF= 40-45% with wall motion abnormalities.  Spoke with Dr. Anne Fu of [facility] cardiology yesterday, who looked up records from the [place] office of prior 2-D echo done on the patient.  Records indicate that on 09/09/2012 patient had a 2-D echo done with a normal ejection fraction mild LVH and mild TR/MR. Also mild dilated left atrium.  Patient also noted to have a cardiac catheterization in 2008 that showed a 95% RCA stenosis as well as a 30-40% LAD stenosis which was managed medically.  Cardiology has been consulted and ff. D/C'd ACE inhibitor per cardiology rxcs. and recommended outpatient Lexiscan myoview for preop clearance prior to potential carotid surgery.   UTI  Urine cultures c/w proteus mirabilis. D/c Cipro.   status post vitrectomy 09/29/2013  Patient has been seen by ophthalmology and recommend continue all eye drops as ordered and followup one week post discharge as outpatient.  history of CHF  Stable. Continue metoprolol.   hypertension  Stable. Metoprolol.   anxiety  Continue anxiolytics as needed.   hyperlipidemia  Continue statin.      Procedures: Echo:Study Conclusions  - Left ventricle: Systolic function was  mildly to moderately reduced. The estimated ejection fraction was in the range of 40% to 45%. Moderate hypokinesis of the inferolateral, inferior, and inferoseptal myocardium. Features are consistent with a pseudonormal left ventricular filling pattern, with concomitant abnormal relaxation and increased filling pressure (grade 2 diastolic dysfunction). - Mitral valve: Calcified annulus. Mild to moderate regurgitation directed centrally. - Left atrium: The atrium was severely dilated. - Pulmonary arteries: Systolic pressure was moderately increased. PA peak pressure: 56mm Hg (S).     Consultations:  neuro  Discharge Exam: Filed Vitals:   10/06/13 1006  BP: 151/54  Pulse: 69  Temp: 97.8 F (36.6 C)  Resp: 18    General: pleasant/cooperative Cardiovascular: rrr Respiratory: clear  Discharge Instructions       Future Appointments Provider Department Dept Phone   10/18/2013 11:15 AM [facility] [phone]   10/25/2013 11:30 AM [facility] SITE 3 NUCLEAR MED [phone]   10/27/2013 9:50 AM Beatrice Lecher, PA-C [facility] [place] Office [phone]   11/03/2013 2:00 PM [facility] [place] [facility] [address] [phone]   11/03/2013 3:00 PM [facility] [place] [facility] [address] [phone]   11/03/2013 4:00 PM Sherren Kerns, MD [facility] [phone]       Medication List    STOP taking these medications       aspirin EC 81 MG tablet  Replaced by:  aspirin 325 MG tablet     HYDROcodone-acetaminophen 10-325 MG per tablet  Commonly known as:  NORCO     methocarbamol 500 MG tablet  Commonly known as:  ROBAXIN     pravastatin 80 MG tablet  Commonly known as:  PRAVACHOL      TAKE these medications       ALPRAZolam 0.25 MG tablet  Commonly known as:  XANAX  Take 1 tablet every night at bedtime  for anxiety     aspirin 325 MG tablet  Take 1 tablet (325 mg total) by mouth daily.     atorvastatin 40 MG tablet  Commonly known as:  LIPITOR  Take 1 tablet (40 mg total) by mouth daily at 6 PM.     ciprofloxacin 0.3 % ophthalmic solution  Commonly known as:  CILOXAN  Place 1 drop into the right eye 4 (four) times daily. Administer 1 drop, every 2 hours, while awake, for 2 days. Then 1 drop, every 4 hours, while awake, for the next 5 days.     diclofenac sodium 1 % Gel  Commonly known as:  VOLTAREN  Apply 2 g topically 4 (four) times daily as needed (knee pain).     dorzolamide-timolol 22.3-6.8 MG/ML ophthalmic solution  Commonly known as:  COSOPT  Place 1 drop into the right eye 2 (two) times daily.     DSS 100 MG Caps  Take 100 mg by mouth 2 (two) times daily.     furosemide 20 MG tablet  Commonly known as:  LASIX  Take 1 tablet (20 mg total) by mouth daily.     metoprolol 50 MG tablet  Commonly known as:  LOPRESSOR  Take 50 mg by mouth 2 (two) times daily.  multivitamin with minerals Tabs tablet  Take 1 tablet by mouth daily.     pantoprazole 40 MG tablet  Commonly known as:  PROTONIX  Take 1 tablet (40 mg total) by mouth daily.     polyethylene glycol packet  Commonly known as:  MIRALAX / GLYCOLAX  Take 17 g by mouth daily.     pramipexole 0.25 MG tablet  Commonly known as:  MIRAPEX  Take 1 tablet (0.25 mg total) by mouth 3 (three) times daily as needed (restless leg syndrome).     pramipexole 0.125 MG tablet  Commonly known as:  MIRAPEX  Take 0.25 mg by mouth at bedtime.     prednisoLONE acetate 1 % ophthalmic suspension  Commonly known as:  PRED FORTE  Place 1 drop into the right eye 4 (four) times daily.       Allergies  Allergen Reactions  . Penicillins Shortness Of Breath    Throat closed up and had hives and nausea   Follow-up Information   Follow up with Gates Rigg, MD In 2 months. (call for appointment)    Specialties:   Neurology, Radiology   Contact information:   [address] [place] [address] [phone]       Follow up with [facility] [address] On 10/25/2013. (Stress test at 11:30 am.)    Contact information:   [address] [place] [address]       Follow up with Tereso Newcomer, PA-C On 10/27/2013. (See for Dr. Swaziland at 9:30 am.)    Specialty:  Physician Assistant   Contact information:   [address] [place] [address] [phone]        The results of significant diagnostics from this hospitalization (including imaging, microbiology, ancillary and laboratory) are listed below for reference.    Significant Diagnostic Studies: Ct Angio Head W/cm &/or Wo Cm  09/30/2013   CLINICAL DATA:  Right eye surgery 1 day ago for retinal disc abdomen. Developed garbled speech and possible syncopal episode  EXAM: CT ANGIOGRAPHY HEAD AND NECK  TECHNIQUE: Multidetector CT imaging of the head and neck was performed using the standard protocol during bolus administration of intravenous contrast. Multiplanar CT image reconstructions including MIPs were obtained to evaluate the vascular anatomy. Carotid stenosis measurements (when applicable) are obtained utilizing NASCET criteria, using the distal internal carotid diameter as the denominator.  CONTRAST:  50mL OMNIPAQUE IOHEXOL 350 MG/ML SOLN  COMPARISON:  Noncontrast CT head earlier today.  FINDINGS: CTA HEAD FINDINGS  Moderate cerebral and cerebellar atrophy. No cortical hypodensity. No abnormal postcontrast enhancement. Biparietal thinning. Major dural venous sinuses patent. Postop changes in the right globe.  50% cavernous ICA stenosis on the right. Calcific non stenotic atheromatous change in the cavernous region on the left. Widely patent supraclinoid ICAs bilaterally. Widely patent basilar artery with right vertebral dominant. Left vertebral ends in PICA. No intracranial stenosis or occlusion is evident.   Review of the MIP images confirms the above findings.  CTA NECK FINDINGS  Transverse arch heavily calcified with some mural thrombus proximally. Conventional branching of great vessels from the arch. Moderate atheromatous change at the right and left subclavian origin. Right vertebral dominant with left vertebral hypoplastic.  Heavily calcified plaque at the right carotid bifurcation extending into the proximal right internal carotid artery. Ratio of 2.9/ 4.6 proximal/distal is consistent with a less than 50% stenosis. No ulceration or soft plaque. No evidence for dissection.  Extensive heavily calcified left carotid bifurcation  with severe luminal narrowing. Ratio of 0.8/5.2 proximal/distal consistent with a 80-90% stenosis. No ulceration or soft plaque. No evidence for dissection.  The patient is status post C3- C6 posterior fusion with laminectomy. Moderate anterolisthesis T1 on T2 is degenerative in nature. There is healing of the previously identified C5 fracture with restoration of normal alignment. Instrumentation grossly intact. There is fusion at least across the C3-4 interspace. There is nonunion of a type 2 odontoid fracture with mild canal stenosis on the right but no apparent vertebral artery compromise. No lung apex lesion. No neck masses. No osseous destructive lesion although skeletal osteopenia is noted.  Review of the MIP images confirms the above findings.  IMPRESSION: 80-90% heavily calcified left internal carotid artery stenosis. This is potentially flow reducing.  No intracranial stenosis, occlusion, or visible acute infarct.  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 No apparent vertebral artery compromise.  Previous C3 through C6 fusion with healing of C5 fracture.  Vascular Findings reviewed with ordering provider.   Electronically Signed   By: Davonna Belling M.D.   On: 09/30/2013 12:47   Dg Chest 2 View  10/01/2013   CLINICAL DATA:  Stroke symptoms  EXAM:  CHEST  2 VIEW  COMPARISON:  PA and lateral chest x-ray dated March 19, 2008.  FINDINGS: The lungs are mildly hypoinflated. There are increased lung markings at both lung bases consistent with atelectasis or pneumonia. The hemidiaphragms remain visible but there is blunting of the costophrenic angles consistent with small bilateral pleural effusions. The cardiopericardial silhouette is enlarged. The central pulmonary vascularity is engorged. There is chronic deformity of the midshaft of the right clavicle.  IMPRESSION: 1. New bibasilar increased density is consistent with atelectasis or pneumonia. There are small bilateral pleural effusions. 2. There is enlargement of the cardiac silhouette with prominence of the central pulmonary vascularity consistent with low-grade CHF.   Electronically Signed   By: David  Swaziland   On: 10/01/2013 09:20   Ct Head Wo Contrast  09/30/2013   CLINICAL DATA:  Code stroke.  EXAM: CT HEAD WITHOUT CONTRAST  TECHNIQUE: Contiguous axial images were obtained from the base of the skull through the vertex without intravenous contrast.  COMPARISON:  12/25/2012  FINDINGS: There is no evidence of intra-axial nor extra-axial fluid collections, no evidence of acute hemorrhage. Diffuse cortical atrophy is identified. Mild areas of low attenuation project within the subcortical, deep and periventricular white matter regions. Ventricles and cisterns are patent. There is no evidence of subfalcine or tonsillar herniation. Iatrogenic changes are appreciated within the right globe consistent with recent surgical intervention. There is mild mucosal thickening within the ethmoid air cells. Remaining visualized paranasal sinuses and mastoid air cells are patent. There is no evidence of a depressed skull fracture. The bones are osteopenic.  IMPRESSION: Involutional and chronic changes without evidence of acute intracranial abnormalities. Iatrogenic changes are appreciated within the right globe. These results  were called by telephone at the time of interpretation on 09/30/2013 at 11:35 AM to Dr. Roseanne Reno, who verbally acknowledged these results.   Electronically Signed   By: Salome Holmes M.D.   On: 09/30/2013 11:36   Ct Angio Neck W/cm &/or Wo/cm  09/30/2013   CLINICAL DATA:  Right eye surgery 1 day ago for retinal disc abdomen. Developed garbled speech and possible syncopal episode  EXAM: CT ANGIOGRAPHY HEAD AND NECK  TECHNIQUE: Multidetector CT imaging of the head and neck was performed using the standard protocol during bolus  administration of intravenous contrast. Multiplanar CT image reconstructions including MIPs were obtained to evaluate the vascular anatomy. Carotid stenosis measurements (when applicable) are obtained utilizing NASCET criteria, using the distal internal carotid diameter as the denominator.  CONTRAST:  50mL OMNIPAQUE IOHEXOL 350 MG/ML SOLN  COMPARISON:  Noncontrast CT head earlier today.  FINDINGS: CTA HEAD FINDINGS  Moderate cerebral and cerebellar atrophy. No cortical hypodensity. No abnormal postcontrast enhancement. Biparietal thinning. Major dural venous sinuses patent. Postop changes in the right globe.  50% cavernous ICA stenosis on the right. Calcific non stenotic atheromatous change in the cavernous region on the left. Widely patent supraclinoid ICAs bilaterally. Widely patent basilar artery with right vertebral dominant. Left vertebral ends in PICA. No intracranial stenosis or occlusion is evident.  Review of the MIP images confirms the above findings.  CTA NECK FINDINGS  Transverse arch heavily calcified with some mural thrombus proximally. Conventional branching of great vessels from the arch. Moderate atheromatous change at the right and left subclavian origin. Right vertebral dominant with left vertebral hypoplastic.  Heavily calcified plaque at the right carotid bifurcation extending into the proximal right internal carotid artery. Ratio of 2.9/ 4.6 proximal/distal is consistent  with a less than 50% stenosis. No ulceration or soft plaque. No evidence for dissection.  Extensive heavily calcified left carotid bifurcation with severe luminal narrowing. Ratio of 0.8/5.2 proximal/distal consistent with a 80-90% stenosis. No ulceration or soft plaque. No evidence for dissection.  The patient is status post C3- C6 posterior fusion with laminectomy. Moderate anterolisthesis T1 on T2 is degenerative in nature. There is healing of the previously identified C5 fracture with restoration of normal alignment. Instrumentation grossly intact. There is fusion at least across the C3-4 interspace. There is nonunion of a type 2 odontoid fracture with mild canal stenosis on the right but no apparent vertebral artery compromise. No lung apex lesion. No neck masses. No osseous destructive lesion although skeletal osteopenia is noted.  Review of the MIP images confirms the above findings.  IMPRESSION: 80-90% heavily calcified left internal carotid artery stenosis. This is potentially flow reducing.  No intracranial stenosis, occlusion, or visible acute infarct.  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 No apparent vertebral artery compromise.  Previous C3 through C6 fusion with healing of C5 fracture.  Vascular Findings reviewed with ordering provider.   Electronically Signed   By: Davonna Belling M.D.   On: 09/30/2013 12:47   Mr Brain Wo Contrast  10/01/2013   CLINICAL DATA:  Acute onset mental status changes and difficulty speaking.  EXAM: MRI HEAD WITHOUT CONTRAST  MRA HEAD WITHOUT CONTRAST  TECHNIQUE: Multiplanar, multiecho pulse sequences of the brain and surrounding structures were obtained without intravenous contrast. Angiographic images of the head were obtained using MRA technique without contrast.  COMPARISON:  Head CT and CTA 09/30/2013  FINDINGS: MRI HEAD FINDINGS  Limited visualization of the upper cervical spine demonstrate nonunited dens fracture, better seen on  recent CT. Incidental note is made of a partially empty sella. Restricted diffusion is present in cortical and subcortical locations in the left temporoparietal region as well as posterior left insula, consistent with acute infarcts. There is mild edema within these regions of infarct without significant mass effect. There is also a punctate focus of restricted diffusion in left frontal lobe cortex more superiorly. There is no evidence of intracranial hemorrhage. There is mild-to-moderate generalized cerebral atrophy. Scattered, small foci of T2 hyperintensity within the subcortical and deep cerebral white matter bilaterally  as well as within the pons are nonspecific but compatible with mild to moderate chronic small vessel ischemic disease. There is no midline shift or extra-axial fluid collection. Major intracranial vascular flow voids are unremarkable. Abnormal signal within the right globe is likely related to recent surgery. The left maxillary sinus is small.  MRA HEAD FINDINGS  The visualized distal vertebral arteries are patent. The right vertebral artery is dominant the vertebral artery ending in PICA. Right PICA is patent. Basilar artery is patent. SCAs are patent. There is a fetal origin of the left PCA. PCAs are otherwise unremarkable. Internal carotid arteries are patent from skullbase to carotid terminus. Mild right greater than left cavernous carotid atherosclerosis is again seen, better demonstrated on recent CTA. ACA and MCA origins and visualized branches are patent and unremarkable. There is a patent anterior communicating artery. No intracranial aneurysm is identified.  IMPRESSION: 1. Acute left MCA infarct predominantly involving the temporoparietal region. 2. No evidence of major intracranial arterial occlusion or flow-limiting stenosis. These results will be called to the ordering clinician or representative by the Radiologist Assistant, and communication documented in the PACS Dashboard.    Electronically Signed   By: Sebastian Ache   On: 10/01/2013 09:22   Mr Maxine Glenn Head/brain Wo Cm  10/01/2013   CLINICAL DATA:  Acute onset mental status changes and difficulty speaking.  EXAM: MRI HEAD WITHOUT CONTRAST  MRA HEAD WITHOUT CONTRAST  TECHNIQUE: Multiplanar, multiecho pulse sequences of the brain and surrounding structures were obtained without intravenous contrast. Angiographic images of the head were obtained using MRA technique without contrast.  COMPARISON:  Head CT and CTA 09/30/2013  FINDINGS: MRI HEAD FINDINGS  Limited visualization of the upper cervical spine demonstrate nonunited dens fracture, better seen on recent CT. Incidental note is made of a partially empty sella. Restricted diffusion is present in cortical and subcortical locations in the left temporoparietal region as well as posterior left insula, consistent with acute infarcts. There is mild edema within these regions of infarct without significant mass effect. There is also a punctate focus of restricted diffusion in left frontal lobe cortex more superiorly. There is no evidence of intracranial hemorrhage. There is mild-to-moderate generalized cerebral atrophy. Scattered, small foci of T2 hyperintensity within the subcortical and deep cerebral white matter bilaterally as well as within the pons are nonspecific but compatible with mild to moderate chronic small vessel ischemic disease. There is no midline shift or extra-axial fluid collection. Major intracranial vascular flow voids are unremarkable. Abnormal signal within the right globe is likely related to recent surgery. The left maxillary sinus is small.  MRA HEAD FINDINGS  The visualized distal vertebral arteries are patent. The right vertebral artery is dominant the vertebral artery ending in PICA. Right PICA is patent. Basilar artery is patent. SCAs are patent. There is a fetal origin of the left PCA. PCAs are otherwise unremarkable. Internal carotid arteries are patent from  skullbase to carotid terminus. Mild right greater than left cavernous carotid atherosclerosis is again seen, better demonstrated on recent CTA. ACA and MCA origins and visualized branches are patent and unremarkable. There is a patent anterior communicating artery. No intracranial aneurysm is identified.  IMPRESSION: 1. Acute left MCA infarct predominantly involving the temporoparietal region. 2. No evidence of major intracranial arterial occlusion or flow-limiting stenosis. These results will be called to the ordering clinician or representative by the Radiologist Assistant, and communication documented in the PACS Dashboard.   Electronically Signed   By: Sebastian Ache  On: 10/01/2013 09:22    Microbiology: Recent Results (from the past 240 hour(s))  URINE CULTURE     Status: None   Collection Time    09/30/13 12:40 PM      Result Value Range Status   Specimen Description URINE, CLEAN CATCH   Final   Special Requests NONE   Final   Culture  Setup Time     Final   Value: 09/30/2013 17:09     Performed at [facility]   Colony Count     Final   Value: 50,000 COLONIES/ML     Performed at [facility]   Culture     Final   Value: PROTEUS MIRABILIS     Performed at [facility]   Report Status 10/02/2013 FINAL   Final   Organism ID, Bacteria PROTEUS MIRABILIS   Final     Labs: Basic Metabolic Panel:  Recent Labs Lab 10/02/13 0555 10/03/13 0419 10/04/13 0604 10/05/13 0441 10/06/13 0550  NA 139 138 140 140 141  K 4.4 4.2 4.2 4.1 4.4  CL 100 100 101 102 103  CO2 28 30 28 28 27   GLUCOSE 99 85 89 97 89  BUN 23 20 18 18 21   CREATININE 0.87 0.79 0.84 0.87 0.82  CALCIUM 9.1 9.1 9.2 9.2 9.4   Liver Function Tests:  Recent Labs Lab 09/30/13 1102  AST 32  ALT 26  ALKPHOS 137*  BILITOT 0.4  PROT 7.8  ALBUMIN 3.5   No results found for this basename: LIPASE, AMYLASE,  in the last 168 hours No results found for this basename: AMMONIA,  in the last 168  hours CBC:  Recent Labs Lab 09/30/13 1102 09/30/13 1111  WBC 11.7*  --   NEUTROABS 8.3*  --   HGB 10.6* 11.9*  HCT 32.7* 35.0*  MCV 90.8  --   PLT 299  --    Cardiac Enzymes:  Recent Labs Lab 09/30/13 1102  TROPONINI <0.30   BNP: BNP (last 3 results)  Recent Labs  10/02/13 0555  PROBNP 2552.0*   CBG:  Recent Labs Lab 09/30/13 1203  GLUCAP 93       Signed:  VANN, JESSICA  [facility] 10/06/2013, 2:43 PM

## 2013-10-06 NOTE — PMR Pre-admission (Signed)
PMR Admission Coordinator Pre-Admission Assessment  Patient: [name] is an 80 y.o., female MRN: [id number] DOB: 08-27-1931 Height: 5' 3" (160 cm) Weight: 73.7 kg (162 lb 7.7 oz)              Insurance Information HMO: Yes, enhanced    PPO:       PCP:       IPA:       80/20:       OTHER:  Group # [id number] PRIMARY: Blue Medicare      Policy#:  [id number]      SubscriberNikky Duba Brillhart CM Name:        Phone#:       Fax#: [phone] Pre-Cert#: [id number] with update due 10/13/13     Employer: Retired Benefits:  Phone #: [phone]     Name: Lorretta Harp. Date: 09/15/13     Deduct:$0      Out of Pocket Max: $4500(met $190.57)      Life Max: unlimited CIR: $250 days 1-6, $0 days 7+      SNF: $0 days 1-20, $60 days 21-100 Outpatient: no visit limit.  Auth for ST.     Co-Pay: $35/visit Home Health: 100%      Co-Pay: none DME: 80%     Co-Pay: 20% Providers: in network  Emergency Contact Information Contact Information   Name Relation Home Work Mobile   [place] Daughter [phone]  [phone]   Znya, Albino [phone]  [phone]     Current Medical History  Patient Admitting Diagnosis: left temporal-parietal infarct  History of Present Illness:  An 80 y.o. RH-female with history of CHF, CAD, C2 dens fracture with left sided weakness; who underwent vitrectomy on 09/29/13 and on follow up in office next day developed acute onset of confusion and inability to talk. CT head without acute changes. CTA head/neck with 80-90% heavily calcified left internal carotid artery stenosis with potentially flow reduction and no intracranial stenosis, occlusion, or visible acute infarct. MRI brain done revealing acute left MCA infarct predominantly involving the temporoparietal region. 2D echo with EF 40-45% with moderate hypokinesis, grade 2 diastolic dysfunction and mild to moderate MVR. Dr Oneida Alar consulted and recommends stent v/s CEA in 4-6 weeks for symptomatic carotid stenosis. Dr.  Wynonia Lawman consulted for input on echo and recommended medical management as patient without clinical HF. Neurology recommended increasing ASA to 325 mg daily for stroke due to L-CAS. Maintained on subcutaneous Lovenox for DVT prophylaxis. She is tolerating a regular diet. WOC consulted for input on stage III cocygeal ulcer and calcium alginate ordered for treatment. Physical occupational therapy evaluations completed an ongoing with recommendations for physical medicine rehabilitation consult to consider inpatient rehabilitation services. Patient will be admitted for comprehensive rehabilitation program.      Total: 2=NIH  Past Medical History  Past Medical History  Diagnosis Date  . CHF (congestive heart failure)   . Coronary artery disease   . Hypertension   . Arthritis   . Edema of extremities   . Anxiety   . HLD (hyperlipidemia)     Family History  family history includes Alzheimer's disease in her sister; Breast cancer in her sister; CAD in her brother; Dementia in her sister; Heart disease in her sister and son.  Prior Rehab/Hospitalizations:  Broke her neck in 04/14 and was at [facility] for 100 days.  Also had in home [facility] as well.   Current Medications  Current facility-administered medications:acetaminophen (TYLENOL) tablet 650 mg, 650 mg, Oral, Q4H  PRN, Kelvin Cellar, MD, 650 mg at 10/03/13 0957;  ALPRAZolam Duanne Moron) tablet 0.25 mg, 0.25 mg, Oral, QHS PRN, Kelvin Cellar, MD, 0.25 mg at 10/02/13 2148;  aspirin suppository 300 mg, 300 mg, Rectal, Daily, Kelvin Cellar, MD;  aspirin tablet 325 mg, 325 mg, Oral, Daily, Kelvin Cellar, MD, 325 mg at 10/06/13 1021 atorvastatin (LIPITOR) tablet 40 mg, 40 mg, Oral, q1800, Kelvin Cellar, MD, 40 mg at 10/05/13 1726;  ciprofloxacin (CILOXAN) 0.3 % ophthalmic solution 1 drop, 1 drop, Right Eye, QID, Corliss Parish, MD, 1 drop at 10/06/13 1448;  diclofenac sodium (VOLTAREN) 1 % transdermal gel 2 g, 2 g, Topical, QID PRN, Eugenie Filler,  MD, 2 g at 10/05/13 2153 docusate sodium (COLACE) capsule 100 mg, 100 mg, Oral, BID, Eugenie Filler, MD, 100 mg at 10/06/13 1027;  dorzolamide-timolol (COSOPT) 22.3-6.8 MG/ML ophthalmic solution 1 drop, 1 drop, Right Eye, BID, Corliss Parish, MD, 1 drop at 10/06/13 1023;  enoxaparin (LOVENOX) injection 40 mg, 40 mg, Subcutaneous, Q24H, Kelvin Cellar, MD, 40 mg at 10/05/13 2201 furosemide (LASIX) tablet 20 mg, 20 mg, Oral, Daily, Rhonda G Barrett, PA-C, 20 mg at 10/06/13 1022;  HYDROmorphone (DILAUDID) injection 1 mg, 1 mg, Intravenous, Q2H PRN, Kelvin Cellar, MD, 1 mg at 10/05/13 2342;  labetalol (NORMODYNE,TRANDATE) injection 10 mg, 10 mg, Intravenous, Q2H PRN, Kelvin Cellar, MD;  metoprolol (LOPRESSOR) tablet 50 mg, 50 mg, Oral, BID, Kelvin Cellar, MD, 50 mg at 10/06/13 1022 morphine 2 MG/ML injection 2 mg, 2 mg, Intravenous, Q3H PRN, Kelvin Cellar, MD, 2 mg at 10/04/13 0031;  pantoprazole (PROTONIX) EC tablet 40 mg, 40 mg, Oral, Daily, Kelvin Cellar, MD, 40 mg at 10/06/13 1027;  polyethylene glycol (MIRALAX / GLYCOLAX) packet 17 g, 17 g, Oral, Daily, Eugenie Filler, MD, 17 g at 10/06/13 1023;  pramipexole (MIRAPEX) tablet 0.25 mg, 0.25 mg, Oral, QHS, Kelvin Cellar, MD, 0.25 mg at 10/05/13 2315 pramipexole (MIRAPEX) tablet 0.25 mg, 0.25 mg, Oral, TID PRN, Kelvin Cellar, MD, 0.25 mg at 10/06/13 1022;  prednisoLONE acetate (PRED FORTE) 1 % ophthalmic suspension 1 drop, 1 drop, Right Eye, QID, Corliss Parish, MD, 1 drop at 10/06/13 1448  Patients Current Diet: Cardiac  Precautions / Restrictions Precautions Precautions: Fall Precaution Comments: Patient fearful of falling in standing Restrictions Weight Bearing Restrictions: No   Prior Activity Level Limited Community (1-2x/wk): Went out 2-3 X a week.  Went to MD office and to [facility].  Home Assistive Devices / Equipment Home Assistive Devices/Equipment: Wheelchair Home Equipment: Environmental consultant - 2 wheels;Shower seat;Wheelchair -  manual;Bedside commode  Prior Functional Level Prior Function Level of Independence: Needs assistance Gait / Transfers Assistance Needed: Since fall in April, patient primarily uses w/c.  Is able to ambulate with RW very short distances in house with assist. ADL's / Homemaking Assistance Needed: Assist with bathing, dressing, meal prep, housekeeping  Current Functional Level Cognition  Arousal/Alertness: Awake/alert Overall Cognitive Status: Within Functional Limits for tasks assessed Difficult to assess due to:  (communication) Orientation Level: Oriented X4 Attention: Selective Selective Attention: Appears intact Memory: Appears intact Awareness: Appears intact Problem Solving: Appears intact Executive Function: Reasoning;Decision Making Reasoning: Appears intact Decision Making: Appears intact Safety/Judgment: Appears intact    Extremity Assessment (includes Sensation/Coordination)          ADLs  Eating/Feeding: Simulated;Set up Where Assessed - Eating/Feeding: Edge of bed Grooming: Performed;Wash/dry face;Wash/dry hands;Set up Where Assessed - Grooming: Unsupported sitting Upper Body Bathing: Simulated;Set up Where Assessed - Upper Body Bathing: Unsupported sitting  Lower Body Bathing: Minimal assistance;Simulated Where Assessed - Lower Body Bathing: Unsupported standing;Supported standing Upper Body Dressing: Performed;Moderate assistance Where Assessed - Upper Body Dressing: Unsupported sitting Lower Body Dressing: Performed;Moderate assistance Where Assessed - Lower Body Dressing: Unsupported sitting;Supported standing Toilet Transfer: Simulated;Minimal assistance Toilet Transfer Method: Stand pivot ADL Comments: Pt. has decreased ROM of L SHLD since last April when pt. had fall. Pt. has decreased ability to use L UE for dressing and grooming tasks secondary to decreased ROM.     Mobility  Bed Mobility  Bed Mobility: Supine to Sit  Supine to sit: Min assist   General bed mobility comments: Verbal and tactile cues for technique. A for trunk     Transfers  Transfers  Equipment used: Rolling walker (2 wheeled)  Transfers: Sit to/from Stand  Sit to Stand: Min assist  General transfer comment: VCs for hand placement and upright posture pt with preflexed fwd head posture) assist for stability to come to upright, VCs for hand placement     Ambulation / Gait / Stairs / Wheelchair Mobility  Ambulation/Gait Ambulation Distance (Feet): 40 Feet Gait velocity: decreased General Gait Details: Patient with very kyphotic posture. Max verbal and tactile cues to stand upright. A with turns with use of RW    Posture / Balance      Special needs/care consideration BiPAP/CPAP No CPM No Continuous Drip IV No Dialysis No        Life Vest No Oxygen No Special Bed No Trach Size No Wound Vac (area) No       Skin Has reddened coccyx area with stage III decubitus ulcer.  Was treated at the wound center PTA                               Bowel mgmt: Incontinent at times.  Wears pull-ups at home Bladder mgmt: Voiding in the bathroom with assistance Diabetic mgmt No    Previous Home Environment Living Arrangements: Children  Lives With: Family;Other (Comment) Available Help at Discharge: Available 24 hours/day;Family;Home health Type of Home: House Home Layout: One level Home Access: Ramped entrance [place]: Yes Type of Home Care Services: Homehealth aide  Discharge Living Setting Plans for Discharge Living Setting: Patient's home;Alone;House (Lives alone, but has caregivers around the clock.) Type of Home at Discharge: House Discharge Home Layout: One level Discharge Home Access: Ramped entrance (Ramp at front entry.) Does the patient have any problems obtaining your medications?: No  Social/Family/Support Systems Patient Roles: Parent (Has 3 daughters and 2 sons.) Contact Information: [name] - daughter is POA Anticipated  Caregiver: Daughters and caregivers, Bethena Roys and Teen Anticipated Caregiver's Contact Information: Vickii Chafe - daughter (h) [phone] (c) [phone] Ability/Limitations of Caregiver: Caregiver Judy 8:30 pm to 7:30 am, Teen 7:30 am to 11:30 am, Dtr Suanne Marker after 11:30, Bethena Roys in and out and another daughter 2-3 pm Caregiver Availability: 24/7 (Dtr assures me of 24 hour supervision at discharge.) Discharge Plan Discussed with Primary Caregiver: Yes Is Caregiver In Agreement with Plan?: Yes Does Caregiver/Family have Issues with Lodging/Transportation while Pt is in Rehab?: No  Goals/Additional Needs Patient/Family Goal for Rehab: PT S, OT S/min A, ST S goals Expected length of stay: 13-20 days Cultural Considerations: Christian.  Has attended interdenominational church Dietary Needs: Heart, thin liquids Equipment Needs: TBD Pt/Family Agrees to Admission and willing to participate: Yes Program Orientation Provided & Reviewed with Pt/Caregiver Including Roles  & Responsibilities: Yes   Decrease  burden of Care through IP rehab admission:  N/A  Possible need for SNF placement upon discharge: Not anticipated   Patient Condition: This patient's medical and functional status has changed since the consult dated: 10/03/13 in which the Rehabilitation Physician determined and documented that the patient's condition is appropriate for intensive rehabilitative care in an inpatient rehabilitation facility. See "History of Present Illness" (above) for medical update. Functional changes are: Currently requiring min A to ambulate 40 ft with 2 wheeled RW.  Patient's medical and functional status update has been discussed with the Rehabilitation physician and patient remains appropriate for inpatient rehabilitation. Will admit to inpatient rehab today.  Preadmission Screen Completed By:  Retta Diones, 10/06/2013 3:29 PM ______________________________________________________________________   Discussed status with Dr.  Letta Pate on 10/06/13 at 1553 and received telephone approval for admission today.  Admission Coordinator:  Retta Diones, time1553/Date01/22/15

## 2013-10-06 NOTE — Progress Notes (Signed)
[facility] PROGRESS NOTE  [name] ZOX:[id number] DOB: [birth year] DOA: 09/30/2013 PCP: [name], MD  Assessment/Plan: acute CVA The MRI of the head with acute left MCA infarct. CT angio of head and neck with 80-90% LICA stenosis. Continue ASA for secondary stroke prevention. 2-D echo with EF 40-45%, with moderate hypokinesis of inferolateral, inferiror, and inferoseptal myocardium. pending. Vascular surgery has been consulted per neurology for evaluation of left internal carotid artery stenosis and recommend stent vs CEA in 4-6 weeks. Neurology is following and appreciate input and recommendations.   left ICA stenosis( 80-90% LICA) Vascular surgery ff and recommend stent vs CEA in 4-6 weeks.  abnormal 2-D echo 2-D echo during this hospitalization an EF= 40-45% with wall motion abnormalities. Spoke with Dr. Anne Fu of [facility] cardiology yesterday, who looked up records from the [place] office of prior 2-D echo done on the patient. Records indicate that on 09/09/2012 patient had a 2-D echo done with a normal ejection fraction mild LVH and mild TR/MR. Also mild dilated left atrium. Patient also noted to have a cardiac catheterization in 2008 that showed a 95% RCA stenosis as well as a 30-40% LAD stenosis which was managed medically. Cardiology has been consulted and ff. D/C'd ACE inhibitor per cardiology rxcs. and recommended outpatient Lexiscan myoview for preop clearance prior to potential carotid surgery.   UTI Urine cultures c/w proteus mirabilis. D/c Cipro.   status post vitrectomy 09/29/2013 Patient has been seen by ophthalmology and recommend continue all eye drops as ordered and followup one week post discharge as outpatient.   history of CHF Stable. Continue metoprolol.    hypertension Stable. Metoprolol.  anxiety Continue anxiolytics as needed.  hyperlipidemia Continue statin.   Code Status: full Family Communication: updated patient at  bedside. Disposition Plan: CIR versus SNF once insurance approves   Consultants:  Neurology:  Ophthalmology:  Cards/vascular  Procedures:  MRI/MRA head 10/01/2013  Chest x-ray 10/01/2013  CT angiogram of head and neck 09/30/2013  Antibiotics:  IV Cipro 10/01/13---> 10/02/13  Oral cipro 10/03/13  HPI/Subjective: Feeling well No SOb, no CP  Objective: Filed Vitals:   10/06/13 1006  BP: 151/54  Pulse: 69  Temp: 97.8 F (36.6 C)  Resp: 18    Intake/Output Summary (Last 24 hours) at 10/06/13 1338 Last data filed at 10/06/13 0800  Gross per 24 hour  Intake    120 ml  Output      0 ml  Net    120 ml   Filed Weights   10/03/13 0953 10/04/13 0500 10/05/13 0507  Weight: 76.7 kg (169 lb 1.5 oz) 76.6 kg (168 lb 14 oz) 73.7 kg (162 lb 7.7 oz)    Exam:   General:  NAD  Cardiovascular: RRR  Respiratory: CTAB  Abdomen: soft, nontender, nondistended, positive bowel sounds.  Musculoskeletal: no clubbing cyanosis or edema  Data Reviewed: Basic Metabolic Panel:  Recent Labs Lab 10/02/13 0555 10/03/13 0419 10/04/13 0604 10/05/13 0441 10/06/13 0550  NA 139 138 140 140 141  K 4.4 4.2 4.2 4.1 4.4  CL 100 100 101 102 103  CO2 28 30 28 28 27   GLUCOSE 99 85 89 97 89  BUN 23 20 18 18 21   CREATININE 0.87 0.79 0.84 0.87 0.82  CALCIUM 9.1 9.1 9.2 9.2 9.4   Liver Function Tests:  Recent Labs Lab 09/30/13 1102  AST 32  ALT 26  ALKPHOS 137*  BILITOT 0.4  PROT 7.8  ALBUMIN 3.5   No results found for this basename:  LIPASE, AMYLASE,  in the last 168 hours No results found for this basename: AMMONIA,  in the last 168 hours CBC:  Recent Labs Lab 09/30/13 1102 09/30/13 1111  WBC 11.7*  --   NEUTROABS 8.3*  --   HGB 10.6* 11.9*  HCT 32.7* 35.0*  MCV 90.8  --   PLT 299  --    Cardiac Enzymes:  Recent Labs Lab 09/30/13 1102  TROPONINI <0.30   BNP (last 3 results)  Recent Labs  10/02/13 0555  PROBNP 2552.0*   CBG:  Recent Labs Lab  09/30/13 1203  GLUCAP 93    Recent Results (from the past 240 hour(s))  URINE CULTURE     Status: None   Collection Time    09/30/13 12:40 PM      Result Value Range Status   Specimen Description URINE, CLEAN CATCH   Final   Special Requests NONE   Final   Culture  Setup Time     Final   Value: 09/30/2013 17:09     Performed at [facility]   Colony Count     Final   Value: 50,000 COLONIES/ML     Performed at [facility]   Culture     Final   Value: PROTEUS MIRABILIS     Performed at [facility]   Report Status 10/02/2013 FINAL   Final   Organism ID, Bacteria PROTEUS MIRABILIS   Final     Studies: No results found.  Scheduled Meds: . aspirin  300 mg Rectal Daily   Or  . aspirin  325 mg Oral Daily  . atorvastatin  40 mg Oral q1800  . ciprofloxacin  1 drop Right Eye QID  . docusate sodium  100 mg Oral BID  . dorzolamide-timolol  1 drop Right Eye BID  . enoxaparin (LOVENOX) injection  40 mg Subcutaneous Q24H  . furosemide  20 mg Oral Daily  . metoprolol  50 mg Oral BID  . pantoprazole  40 mg Oral Daily  . polyethylene glycol  17 g Oral Daily  . pramipexole  0.25 mg Oral QHS  . prednisoLONE acetate  1 drop Right Eye QID   Continuous Infusions:   Principal Problem:   CVA (cerebral infarction) Active Problems:   Coronary artery disease   Hypertension   Anemia of other chronic disease   Slurred speech   UTI (urinary tract infection)   Occlusion and stenosis of carotid artery with cerebral infarction    Time spent: 35 minutes    Jenkins Risdon  [facility] Pager [phone]. If 7PM-7AM, please contact night-coverage at [email or web address], password [email or web address] 10/06/2013, 1:38 PM  LOS: 6 days

## 2013-10-06 NOTE — Consult Note (Signed)
[name] [name] SN NCAT/ Toni ScaleS Wilson EdD

## 2013-10-06 NOTE — Clinical Social Work Note (Signed)
CSW continuing to follow for possible SNF placement as back-up plan to CIR. Pt has bed offers to SNF placement in the event pt is unable to be accepted to CIR.   Darlyn ChamberEmily [place], LCSWA Clinical Social Worker [phone]

## 2013-10-06 NOTE — Progress Notes (Signed)
Rehab admissions - [facility]. We also faxed clinical updates. Awaiting call back regarding possible inpatient acute rehab admission. Steward DroneBrenda case manager is aware. Updated pt as well.   Please call for questions. Thanks,  Juliann MuleJanine Vaishnavi Dalby, PT Rehabilitation Admissions Coordinator [phone]

## 2013-10-07 ENCOUNTER — Inpatient Hospital Stay (HOSPITAL_COMMUNITY): Payer: Medicare Other

## 2013-10-07 ENCOUNTER — Inpatient Hospital Stay (HOSPITAL_COMMUNITY): Payer: Medicare Other | Admitting: Physical Therapy

## 2013-10-07 DIAGNOSIS — I6330 Cerebral infarction due to thrombosis of unspecified cerebral artery: Secondary | ICD-10-CM

## 2013-10-07 DIAGNOSIS — G8110 Spastic hemiplegia affecting unspecified side: Secondary | ICD-10-CM

## 2013-10-07 LAB — COMPREHENSIVE METABOLIC PANEL
ALK PHOS: 117 U/L (ref 39–117)
ALT: 23 U/L (ref 0–35)
AST: 31 U/L (ref 0–37)
Albumin: 2.9 g/dL — ABNORMAL LOW (ref 3.5–5.2)
BUN: 19 mg/dL (ref 6–23)
CALCIUM: 9.3 mg/dL (ref 8.4–10.5)
CO2: 27 mEq/L (ref 19–32)
Chloride: 104 mEq/L (ref 96–112)
Creatinine, Ser: 0.83 mg/dL (ref 0.50–1.10)
GFR calc non Af Amer: 65 mL/min — ABNORMAL LOW (ref >90)
GFR, EST AFRICAN AMERICAN: 75 mL/min — AB (ref >90)
GLUCOSE: 88 mg/dL (ref 70–99)
Potassium: 4.3 mEq/L (ref 3.7–5.3)
SODIUM: 142 meq/L (ref 137–147)
Total Bilirubin: 0.3 mg/dL (ref 0.3–1.2)
Total Protein: 6.8 g/dL (ref 6.0–8.3)

## 2013-10-07 LAB — CBC WITH DIFFERENTIAL/PLATELET
Basophils Absolute: 0.1 10*3/uL (ref 0.0–0.1)
Basophils Relative: 1 % (ref 0–1)
EOS ABS: 0.4 10*3/uL (ref 0.0–0.7)
EOS PCT: 4 % (ref 0–5)
HCT: 32.5 % — ABNORMAL LOW (ref 36.0–46.0)
Hemoglobin: 10.6 g/dL — ABNORMAL LOW (ref 12.0–15.0)
Lymphocytes Relative: 26 % (ref 12–46)
Lymphs Abs: 2.7 10*3/uL (ref 0.7–4.0)
MCH: 29.5 pg (ref 26.0–34.0)
MCHC: 32.6 g/dL (ref 30.0–36.0)
MCV: 90.5 fL (ref 78.0–100.0)
Monocytes Absolute: 0.8 10*3/uL (ref 0.1–1.0)
Monocytes Relative: 8 % (ref 3–12)
Neutro Abs: 6.3 10*3/uL (ref 1.7–7.7)
Neutrophils Relative %: 62 % (ref 43–77)
PLATELETS: 259 10*3/uL (ref 150–400)
RBC: 3.59 MIL/uL — ABNORMAL LOW (ref 3.87–5.11)
RDW: 15.4 % (ref 11.5–15.5)
WBC: 10.2 10*3/uL (ref 4.0–10.5)

## 2013-10-07 MED ORDER — TRAMADOL HCL 50 MG PO TABS
50.0000 mg | ORAL_TABLET | Freq: Four times a day (QID) | ORAL | Status: DC | PRN
  Administered 2013-10-07 – 2013-10-14 (×11): 50 mg via ORAL
  Filled 2013-10-07 (×12): qty 1

## 2013-10-07 MED ORDER — ENSURE COMPLETE PO LIQD: 237.0000 mL | Freq: Every day | ORAL | Status: DC | PRN

## 2013-10-07 MED ORDER — ADULT MULTIVITAMIN W/MINERALS CH
1.0000 | ORAL_TABLET | Freq: Every day | ORAL | Status: DC
  Administered 2013-10-07 – 2013-10-14 (×8): 1 via ORAL
  Filled 2013-10-07 (×10): qty 1

## 2013-10-07 MED ORDER — JUVEN PO PACK
1.0000 | PACK | Freq: Two times a day (BID) | ORAL | Status: DC
  Administered 2013-10-08 – 2013-10-14 (×12): 1 via ORAL
  Filled 2013-10-07 (×14): qty 1

## 2013-10-07 NOTE — IPOC Note (Signed)
Overall Plan of Care [facility](IPOC) Patient Details Name: [name] MRN: [id number] DOB: Aug 03, 1931  Admitting Diagnosis: L CVA  Hospital Problems: Active Problems:   CVA (cerebral infarction)     Functional Problem List: Nursing Endurance;Motor;Skin Integrity  PT Balance;Sensory;Skin Integrity;Endurance;Motor;Pain  OT Balance;Endurance;Motor;Safety;Vision  SLP Linguistic  TR         Basic ADL's: OT Grooming;Bathing;Dressing;Toileting     Advanced  ADL's: OT       Transfers: PT Bed Mobility;Bed to Chair;Car;Furniture  OT Toilet;Tub/Shower     Locomotion: PT Ambulation;Wheelchair Mobility;Stairs     Additional Impairments: OT Fuctional Use of Upper Extremity  SLP Communication expression    TR      Anticipated Outcomes Item Anticipated Outcome  Self Feeding    Swallowing      Basic self-care  supervision  Toileting  supervision   Bathroom Transfers supervision  Bowel/Bladder  min assist  Transfers  Supervision  Locomotion  Supervision  Communication  Min with word-finding strategies  Cognition     Pain  < 3  Safety/Judgment  supervision   Therapy Plan: PT Intensity: Minimum of 1-2 x/day ,45 to 90 minutes PT Frequency: 5 out of 7 days PT Duration Estimated Length of Stay: 6-8 days OT Intensity: Minimum of 1-2 x/day, 45 to 90 minutes OT Frequency: 5 out of 7 days OT Duration/Estimated Length of Stay: 6-8 days SLP Intensity: Minumum of 1-2 x/day, 30 to 90 minutes SLP Frequency: 5 out of 7 days SLP Duration/Estimated Length of Stay: 6-8 days       Team Interventions: Nursing Interventions Skin Care/Wound Management;Cognitive Remediation/Compensation;Patient/Family Education  PT interventions Ambulation/gait training;Balance/vestibular training;Cognitive remediation/compensation;Functional mobility training;Discharge planning;Disease management/prevention;Skin care/wound management;Patient/family education;DME/adaptive equipment instruction;Pain  management;Neuromuscular re-education;Stair training;Therapeutic Activities;Therapeutic Exercise;Wheelchair propulsion/positioning;UE/LE Strength taining/ROM;UE/LE Coordination activities  OT Interventions Balance/vestibular training;Cognitive remediation/compensation;Community Designer, multimediareintegration;DME/adaptive equipment instruction;Discharge planning;Functional mobility training;Neuromuscular re-education;Psychosocial support;Self Care/advanced ADL retraining;Skin care/wound managment;Patient/family education;Therapeutic Activities;Therapeutic Exercise;UE/LE Strength taining/ROM;UE/LE Coordination activities  SLP Interventions Cueing hierarchy;Functional tasks;Multimodal communication approach;Patient/family education;Speech/Language facilitation  TR Interventions    SW/CM Interventions      Team Discharge Planning: Destination: PT-Home ,OT- Home , SLP-Home Projected Follow-up: PT-Home health PT, OT-  Home health OT, SLP-Home Health SLP;24 hour supervision/assistance Projected Equipment Needs: PT-To be determined, OT- None recommended by OT, SLP-None recommended by SLP Equipment Details: PT-Pt owns personal rolling walker. Pending daughter report of current w/c cushion, may need specialty cushion., OT-  Patient/family involved in discharge planning: PT- Patient;Family member/caregiver,  OT-Patient, SLP-Patient;Family member/caregiver  MD ELOS: 7-9 days Medical Rehab Prognosis:  Good Assessment: 80 y.o. RH-female with history of CHF, CAD, C2 dens fracture with left sided weakness; who underwent vitrectomy on 09/29/13 and on follow up in office next day developed acute onset of confusion and inability to talk. CT head without acute changes. CTA head/neck with 80-90% heavily calcified left internal carotid artery stenosis with potentially flow reduction and nNo intracranial stenosis, occlusion, or visible acute infarct. MRI brain done revealing acute left MCA infarct predominantly involving the  temporoparietal region. 2D echo with EF 40-45% with moderate hypokinesis, grade 2 diastolic dysfunction and mild to moderate MVR. Dr Darrick PennaFields consulted and recommends stent v/s CEA in 4-6 weeks for symptomatic carotid stenosis. Dr. Donnie Ahoilley consulted for input on echo and recommended medical management as patient without clinical HF. Neurology recommended increasing ASA to 325 mg daily for stroke due to L-CAS.    Now requiring 24/7 Rehab RN,MD, as well as CIR level PT, OT and SLP.  Treatment team will focus on ADLs and  mobility with goals set at Sup  See Team Conference Notes for weekly updates to the plan of care

## 2013-10-07 NOTE — Evaluation (Signed)
Occupational Therapy Assessment and Plan  Patient Details  Name: [name] MRN: [id number] Date of Birth: Jan 28, 1931  OT Diagnosis: abnormal posture, hemiplegia affecting dominant side and muscle weakness (generalized) Rehab Potential: Rehab Potential: Good ELOS: 6-8 days   Today's Date: 10/07/2013 Time: 0730-0830 Time Calculation (min): 60 min  Problem List:  Patient Active Problem List   Diagnosis Date Noted  . UTI (urinary tract infection) 10/01/2013  . Occlusion and stenosis of carotid artery with cerebral infarction 10/01/2013  . CVA (cerebral infarction) 09/30/2013  . Slurred speech 09/30/2013  . Anemia of other chronic disease 03/11/2013  . Osteoporosis 12/25/2012  . Spinal stenosis of lumbar region 12/25/2012  . Chronic back pain 12/25/2012  . Hip pain- LEFT 12/25/2012  . AKI (acute kidney injury) 12/25/2012  . Fall 12/25/2012  . Anemia 12/25/2012  . Hyponatremia 12/25/2012  . Cervical spine fracture 12/25/2012  . RLS (restless legs syndrome) 12/25/2012  . Varicose veins 12/25/2012  . CHF (congestive heart failure)   . Coronary artery disease   . Hypertension   . Arthritis     Past Medical History:  Past Medical History  Diagnosis Date  . CHF (congestive heart failure)   . Coronary artery disease   . Hypertension   . Arthritis   . Edema of extremities   . Anxiety   . HLD (hyperlipidemia)    Past Surgical History:  Past Surgical History  Procedure Laterality Date  . Total knee arthroplasty Bilateral     knee replacements  . Lumbar disc surgery    . Cholecystectomy    . Posterior cervical fusion/foraminotomy N/A 12/28/2012    Procedure: POSTERIOR CERVICAL FUSION/FORAMINOTOMY LEVEL 3;  Surgeon: Kristeen Miss, MD;  Location: [facility];  Service: Neurosurgery;  Laterality: N/A;  Posterior Cervical Three-Six Laminectomy and Fusion  . Angioplasty    . Tonsillectomy      Assessment & Plan Clinical Impression: Patient is a 80 y.o. RH-female with  history of CHF, CAD, C2 dens fracture with left sided weakness; who underwent vitrectomy on 09/29/13 and on follow up in office next day developed acute onset of confusion and inability to talk. CT head without acute changes. CTA head/neck with 80-90% heavily calcified left internal carotid artery stenosis with potentially flow reduction and nNo intracranial stenosis, occlusion, or visible acute infarct. MRI brain done revealing acute left MCA infarct predominantly involving the temporoparietal region. 2D echo with EF 40-45% with moderate hypokinesis, grade 2 diastolic dysfunction and mild to moderate MVR. Dr Oneida Alar consulted and recommends stent v/s CEA in 4-6 weeks for symptomatic carotid stenosis. Dr. Wynonia Lawman consulted for input on echo and recommended medical management as patient without clinical HF. Neurology recommended increasing ASA to 325 mg daily for stroke due to L-CAS. Maintained on subcutaneous Lovenox for DVT prophylaxis. She is tolerating a regular diet. WOC consulted for input on stage III cocygeal ulcer and calcium alginate ordered for treatment. Physical occupational therapy evaluations completed an ongoing with recommendations for physical medicine rehabilitation consult to consider inpatient rehabilitation services. Patient transferred to CIR on 10/06/2013 .    After patient had C2 dens fracture in April of 2014, she went to skilled nursing facility for 100 days. Coccygeal decubitus occurred in the skilled nursing facility. Has been going to outpatient wound center since discharge to home.   Patient currently requires max assist LB dressing and min assist with functional transfers and UB dressing secondary to muscle weakness, decreased cardiorespiratoy endurance and decreased standing balance, decreased postural control and  decreased balance strategies.  Prior to hospitalization, patient could complete BADLs with supervision.  Patient will benefit from skilled intervention to increase  independence with basic self-care skills prior to discharge home with family and hired Health and safety inspector.  Anticipate patient will require 24 hour supervision and follow up home health.  OT - End of Session Activity Tolerance: Improving;Decreased this session Endurance Deficit: Yes Endurance Deficit Description: Pt with significant fatigue, decreased quality of movement after ~35' of ambulation. OT Assessment Rehab Potential: Good OT Patient demonstrates impairments in the following area(s): Balance;Endurance;Motor;Safety;Vision OT Basic ADL's Functional Problem(s): Grooming;Bathing;Dressing;Toileting OT Transfers Functional Problem(s): Toilet;Tub/Shower OT Additional Impairment(s): Fuctional Use of Upper Extremity OT Plan OT Intensity: Minimum of 1-2 x/day, 45 to 90 minutes OT Frequency: 5 out of 7 days OT Duration/Estimated Length of Stay: 6-8 days OT Treatment/Interventions: Balance/vestibular training;Cognitive remediation/compensation;Community reintegration;DME/adaptive equipment instruction;Discharge planning;Functional mobility training;Neuromuscular re-education;Psychosocial support;Self Care/advanced ADL retraining;Skin care/wound managment;Patient/family education;Therapeutic Activities;Therapeutic Exercise;UE/LE Strength taining/ROM;UE/LE Coordination activities OT Basic Self-Care Anticipated Outcome(s): supervision OT Toileting Anticipated Outcome(s): supervision OT Bathroom Transfers Anticipated Outcome(s): supervision OT Recommendation Patient destination: Home Follow Up Recommendations: Home health OT Equipment Recommended: None recommended by OT   Skilled Therapeutic Intervention Pt seen for ADL retraining with focus on functional transfers, sit<>stand, and functional use of RUE. Pt received supine in bed asking to eat breakfast. Pt completed supine>sit with mod assist for management of BLE and sat EOB at supervision for breakfast. Pt opened all containers and used RUE for  management of fork with slight ataxic movements noted. Pt with report of blurred vision in R eye however not affecting self-care tasks. Pt declining bathing this AM and wishing to complete dressing. Stand pivot transfer to w/c with min assist using RW. Pt attempting to use crossover technique for LB however unsuccessful and fatiguing quickly. Pt required max assist LB dressing and min assist for sit<>stand and standing balance. Pt with difficulty with word finding approx 25% of therapy session. At end of session pt left sitting in w/c with all needs in reach.   OT Evaluation Precautions/Restrictions  Precautions Precautions: Fall Restrictions Weight Bearing Restrictions: No General   Vital Signs   Pain Pain Assessment Pain Assessment: No/denies pain Pain Score: 0-No pain Pain Type: Acute pain Pain Location: Buttocks Pain Orientation: Other (Comment) (coccyx) Pain Descriptors / Indicators: Aching Pain Onset: Gradual Pain Intervention(s): Repositioned;Other (Comment) (chnaged w/c cushion; per pt, RN aware and had already given medication) Multiple Pain Sites: No Home Living/Prior Functioning Home Living Family/patient expects to be discharged to:: Private residence Living Arrangements: Children Available Help at Discharge: Available 24 hours/day;Family;Home health Type of Home: House Home Access: Ramped entrance Home Layout: One level Additional Comments: Pt lives alone with 24-hour assistance/supervision via daughter, hired Lawyer, and aid.  Lives With: Family;Other (Comment) (pt has CNA, and other hired help during day) Prior Function Level of Independence: Needs assistance with homemaking;Needs assistance with ADLs;Needs assistance with gait;Requires assistive device for independence  Able to Take Stairs?: No Leisure: Hobbies-yes (Comment) Comments: Reading, playing Bingo ADL   Vision/Perception  Vision - History Baseline Vision: Wears glasses only for reading Visual History:  Other (comment) (virectomy ) Patient Visual Report:  (burred vision in R eye) Vision - Assessment Eye Alignment: Impaired (comment) Perception Perception: Within Functional Limits Praxis Praxis: Intact  Cognition Overall Cognitive Status: Within Functional Limits for tasks assessed Arousal/Alertness: Awake/alert Orientation Level: Oriented X4 Attention: Selective;Sustained Sustained Attention: Appears intact Memory: Impaired Memory Impairment: Decreased recall of new information Awareness: Appears intact Problem Solving: Impaired Problem  Solving Impairment: Functional basic Decision Making: Appears intact Safety/Judgment: Appears intact Sensation Sensation Light Touch: Appears Intact Hot/Cold: Appears Intact Proprioception: Appears Intact Additional Comments: Pt reports diminished light touch (compared to LLE) on dorsum of R foot. Coordination Gross Motor Movements are Fluid and Coordinated: No Fine Motor Movements are Fluid and Coordinated: No Finger Nose Finger Test: slight apraxia noted in RUE Motor  Motor Motor: Other (comment);Abnormal postural alignment and control Motor - Skilled Clinical Observations: Bilat LE weakness (more prominent in RLE); significant thoracic kyphosis with standing/walking Mobility  Transfers Sit to Stand: 4: Min assist;From chair/3-in-1;With armrests Sit to Stand Details: Verbal cues for technique;Verbal cues for safe use of DME/AE Sit to Stand Details (indicate cue type and reason): Verbal cueing for setup, body mechanics/technique and safe hand placement on rolling walker. Stand to Sit: 4: Min assist Stand to Sit Details (indicate cue type and reason): Verbal cues for technique;Verbal cues for safe use of DME/AE Stand to Sit Details: Verbal cueing for hand placement, for safe positioning prior to sitting; min A to control descent.  Trunk/Postural Assessment  Cervical Assessment Cervical Assessment: Within Functional Limits Thoracic  Assessment Thoracic Assessment: Exceptions to [facility] Thoracic Strength Overall Thoracic Strength: Deficits Overall Thoracic Strength Comments: Decreased strength/endurance in thoracic spine extensors. When seated in w/c, thoracic  Lumbar Assessment Lumbar Assessment: Within Functional Limits Postural Control Postural Control: Deficits on evaluation  Balance   Extremity/Trunk Assessment RUE Assessment RUE Assessment: Exceptions to WFL (overall 4/5 strength with slight apraxia) LUE Assessment LUE Assessment: Exceptions to WFL (decreased ROM at shoulder d/t c2 fx)  FIM:  FIM - Grooming Grooming Steps: Wash, rinse, dry face;Wash, rinse, dry hands Grooming: 3: Patient completes 2 of 4 or 3 of 5 steps FIM - Upper Body Dressing/Undressing Upper body dressing/undressing steps patient completed: Thread/unthread right bra strap;Thread/unthread left bra strap;Thread/unthread right sleeve of pullover shirt/dresss;Thread/unthread left sleeve of pullover shirt/dress;Put head through opening of pull over shirt/dress;Pull shirt over trunk Upper body dressing/undressing: 4: Min-Patient completed 75 plus % of tasks FIM - Lower Body Dressing/Undressing Lower body dressing/undressing steps patient completed: Thread/unthread left underwear leg;Pull underwear up/down;Pull pants up/down Lower body dressing/undressing: 2: Max-Patient completed 25-49% of tasks FIM - Control and instrumentation engineer Devices: Adult nurse Transfer: 3: Supine > Sit: Mod A (lifting assist/Pt. 50-74%/lift 2 legs;4: Bed > Chair or W/C: Min A (steadying Pt. > 75%) FIM - Radio producer Devices: Insurance account manager Transfers: 4-To toilet/BSC: Min A (steadying Pt. > 75%);4-From toilet/BSC: Min A (steadying Pt. > 75%)   Refer to Care Plan for Long Term Goals  Recommendations for other services: None  Discharge Criteria: Patient will be discharged from OT if patient refuses treatment 3  consecutive times without medical reason, if treatment goals not met, if there is a change in medical status, if patient makes no progress towards goals or if patient is discharged from hospital.  The above assessment, treatment plan, treatment alternatives and goals were discussed and mutually agreed upon: by patient  Duayne Cal 10/07/2013, 12:40 PM

## 2013-10-07 NOTE — Progress Notes (Signed)
INITIAL NUTRITION ASSESSMENT  DOCUMENTATION CODES Per approved criteria  -Non-severe (moderate) malnutrition in the context of acute illness or injury  Pt meets criteria for Moderate MALNUTRITION in the context of ACUTE as evidenced by >6% wt loss in 2 weeks and mild to moderate fat and muscle wasting per nutrition focused physical exam.  INTERVENTION: Provide Juven BID Provide Multivitamin with minerals daily Provide Ensure Complete once daily as needed  NUTRITION DIAGNOSIS: Unintentional weight loss related to recent hospitalization as evidenced by >6% weight loss in less than one month.   Goal: Pt to meet >/= 90% of their estimated nutrition needs   Monitor:  PO intake Weights Labs   Reason for Assessment: Malnutrition Screening Tool, score of 3  80 y.o. female  Admitting Dx: <principal problem not specified>  ASSESSMENT: 80 y.o. female with history of CHF, CAD, C2 dens fracture with left sided weakness; who underwent vitrectomy on 09/29/13 and on follow up in office next day developed acute onset of confusion and inability to talk. Admitted to Inpatient Rehabilitation with the diagnosis of L MCA infarct.   Pt reports having a good appetite and eating well. Per nursing notes pt is eating 75%-85% of most meals. Pt reports usual body weight of 167 lbs which she weighed one week prior to having stroke. Per weight history pt has lost >6% of her body weight in the past 2 weeks. Per pt's daughter pt was drinking Ensure and eating protein bars PTA because pt was told she needed more protein due to wound on her back; daughter thinks weight loss may be related to pt not receiving supplements or snacks since admission. Daughter also states that recent weight of 152 lbs may be inaccurate due to pt being unsteady and needing to be supported while on scale. Pt reports needing chopped meats.   Nutrition Focused Physical Exam:  Subcutaneous Fat:  Orbital Region: mild wasting Upper Arm  Region: moderate wasting Thoracic and Lumbar Region: NA  Muscle:  Temple Region: moderate wasting Clavicle Bone Region: moderate wasing Clavicle and Acromion Bone Region: wnl Scapular Bone Region: NA Dorsal Hand: moderate wasting Patellar Region: wnl Anterior Thigh Region: wnl Posterior Calf Region: wnl  Edema: none noted  Height: Ht Readings from Last 1 Encounters:  10/07/13 5\' 2"  (1.575 m)    Weight: Wt Readings from Last 1 Encounters:  10/07/13 152 lb 1.9 oz (69 kg)    Ideal Body Weight: 110 lbs  % Ideal Body Weight: 138%  Wt Readings from Last 10 Encounters:  10/07/13 152 lb 1.9 oz (69 kg)  10/05/13 162 lb 7.7 oz (73.7 kg)  03/14/13 162 lb 4 oz (73.596 kg)  12/28/12 181 lb 14.1 oz (82.5 kg)  12/28/12 181 lb 14.1 oz (82.5 kg)    Usual Body Weight: 167 lbs  % Usual Body Weight: 91%  BMI:  Body mass index is 27.82 kg/(m^2).  Estimated Nutritional Needs: Kcal: 1600-1800 Protein: 80-90 grams Fluid: 1.8 L/day  Skin: stage 2 pressure ulcer to mid buttocks  Diet Order: Cardiac  EDUCATION NEEDS: -No education needs identified at this time  No intake or output data in the 24 hours ending 10/07/13 1608  Last BM: 1/23  Labs:   Recent Labs Lab 10/05/13 0441 10/06/13 0550 10/06/13 1950 10/07/13 0555  NA 140 141  --  142  K 4.1 4.4  --  4.3  CL 102 103  --  104  CO2 28 27  --  27  BUN 18 21  --  19  CREATININE 0.87 0.82 0.90 0.83  CALCIUM 9.2 9.4  --  9.3  GLUCOSE 97 89  --  88    CBG (last 3)  No results found for this basename: GLUCAP,  in the last 72 hours  Scheduled Meds: . aspirin  300 mg Rectal Daily   Or  . aspirin  325 mg Oral Daily  . atorvastatin  40 mg Oral q1800  . ciprofloxacin  1 drop Right Eye QID  . docusate sodium  100 mg Oral BID  . dorzolamide-timolol  1 drop Right Eye BID  . enoxaparin (LOVENOX) injection  40 mg Subcutaneous Q24H  . furosemide  20 mg Oral Daily  . metoprolol  50 mg Oral BID  . pantoprazole  40 mg  Oral Daily  . polyethylene glycol  17 g Oral Daily  . pramipexole  0.25 mg Oral QHS  . prednisoLONE acetate  1 drop Right Eye QID    Continuous Infusions:   Past Medical History  Diagnosis Date  . CHF (congestive heart failure)   . Coronary artery disease   . Hypertension   . Arthritis   . Edema of extremities   . Anxiety   . HLD (hyperlipidemia)     Past Surgical History  Procedure Laterality Date  . Total knee arthroplasty Bilateral     knee replacements  . Lumbar disc surgery    . Cholecystectomy    . Posterior cervical fusion/foraminotomy N/A 12/28/2012    Procedure: POSTERIOR CERVICAL FUSION/FORAMINOTOMY LEVEL 3;  Surgeon: Barnett Abu, MD;  Location: [facility];  Service: Neurosurgery;  Laterality: N/A;  Posterior Cervical Three-Six Laminectomy and Fusion  . Angioplasty    . Tonsillectomy      Ian Malkin RD, LDN Inpatient Clinical Dietitian Pager: [phone] After Hours Pager: [phone]

## 2013-10-07 NOTE — Evaluation (Signed)
Speech Language Pathology Assessment and Plan  Patient Details  Name: [name] MRN: [id number] Date of Birth: 1930-11-24  SLP Diagnosis: Aphasia  Rehab Potential: Excellent ELOS: 6-8 days   Today's Date: 10/07/2013 Time: 1330-1430 Time Calculation (min): 60 min  Problem List:  Patient Active Problem List   Diagnosis Date Noted  . UTI (urinary tract infection) 10/01/2013  . Occlusion and stenosis of carotid artery with cerebral infarction 10/01/2013  . CVA (cerebral infarction) 09/30/2013  . Slurred speech 09/30/2013  . Anemia of other chronic disease 03/11/2013  . Osteoporosis 12/25/2012  . Spinal stenosis of lumbar region 12/25/2012  . Chronic back pain 12/25/2012  . Hip pain- LEFT 12/25/2012  . AKI (acute kidney injury) 12/25/2012  . Fall 12/25/2012  . Anemia 12/25/2012  . Hyponatremia 12/25/2012  . Cervical spine fracture 12/25/2012  . RLS (restless legs syndrome) 12/25/2012  . Varicose veins 12/25/2012  . CHF (congestive heart failure)   . Coronary artery disease   . Hypertension   . Arthritis    Past Medical History:  Past Medical History  Diagnosis Date  . CHF (congestive heart failure)   . Coronary artery disease   . Hypertension   . Arthritis   . Edema of extremities   . Anxiety   . HLD (hyperlipidemia)    Past Surgical History:  Past Surgical History  Procedure Laterality Date  . Total knee arthroplasty Bilateral     knee replacements  . Lumbar disc surgery    . Cholecystectomy    . Posterior cervical fusion/foraminotomy N/A 12/28/2012    Procedure: POSTERIOR CERVICAL FUSION/FORAMINOTOMY LEVEL 3;  Surgeon: Kristeen Miss, MD;  Location: [facility];  Service: Neurosurgery;  Laterality: N/A;  Posterior Cervical Three-Six Laminectomy and Fusion  . Angioplasty    . Tonsillectomy      Assessment / Plan / Recommendation Clinical Impression  Pt is an 80 y.o. RH-female with h/o CHF, CAD, C2 dens fracture with left sided weakness; who underwent  vitrectomy on 09/29/13 and on f/u in office next day developed acute onset of confusion and inability to talk. CT head without acute changes. CTA head/neck with 80-90% heavily calcified left ICA stenosis with potentially flow reduction and no intracranial stenosis, occlusion, or visible acute infarct. MRI brain done revealing acute left MCA infarct predominantly involving the temporoparietal region. 2D echo with EF 40-45% with moderate hypokinesis, grade 2 diastolic dysfunction and mild to moderate MVR. She is tolerating a regular diet. WOC consulted for input on stage III cocygeal ulcer and calcium alginate ordered for treatment. PT/OT evaluations completed with recommendations for physical medicine rehabilitation consult to consider inpatient rehabilitation services. Patient was admitted for comprehensive rehabilitation program 1/22 with SLP cognitive-linguistic eval completed 1/23. Pt appears to be within gross functional limits with cognitive function, however presents with expressive aphasia characterized by anomia, semantic paraphasias, and overall mildly dyslfuent speech. Pt will benefit from skilled SLP services to maximize functional communication by increasing fluent speech and use of word-finding strategies.     SLP Assessment  Patient will need skilled [facility] Pathology Services during CIR admission    Recommendations  Patient destination: Home Follow up Recommendations: Home Health SLP;24 hour supervision/assistance Equipment Recommended: None recommended by SLP    SLP Frequency 5 out of 7 days   SLP Treatment/Interventions Cueing hierarchy;Functional tasks;Multimodal communication approach;Patient/family education;Speech/Language facilitation    Pain Pain Assessment Pain Assessment: No/denies pain Pain Score: 0-No pain Prior Functioning Cognitive/Linguistic Baseline: Within functional limits Type of Home: House  Lives With: Family;Other (Comment) (pt has help from family  and CNA/hired help) Available Help at Discharge: Available 24 hours/day;Family;Home health  Short Term Goals: Week 1: SLP Short Term Goal 1 (Week 1): Pt will communicate wants/needs with supervision level cues SLP Short Term Goal 2 (Week 1): Pt will utilize word-finding strategies in conversation with Min cues  See FIM for current functional status Refer to Care Plan for Long Term Goals  Recommendations for other services: None  Discharge Criteria: Patient will be discharged from SLP if patient refuses treatment 3 consecutive times without medical reason, if treatment goals not met, if there is a change in medical status, if patient makes no progress towards goals or if patient is discharged from hospital.  The above assessment, treatment plan, treatment alternatives and goals were discussed and mutually agreed upon: by patient and by family   Germain Osgood, M.A. CCC-SLP [phone]   Germain Osgood 10/07/2013, 2:49 PM

## 2013-10-07 NOTE — Interval H&P Note (Deleted)
[name] was admitted today to Inpatient Rehabilitation with the diagnosis of L MCA infarct.  The patient's history has been reviewed, patient examined, and there is no change in status.  Patient continues to be appropriate for intensive inpatient rehabilitation.  I have reviewed the patient's chart and labs.  Questions were answered to the patient's satisfaction.  Erick ColaceKIRSTEINS,ANDREW E 10/07/2013, 5:55 AM

## 2013-10-07 NOTE — H&P (View-Only) (Signed)
[facility] History and Physical  [name] YNW:[id number] DOB: 24-Jan-1931 DOA: 09/30/2013  Referring physician:  PCP: Astrid DivineGRIFFIN,ELAINE COLLINS, MD   Chief Complaint: Slurred speech  HPI: [name] is a 80 y.o. female with a past medical history of hypertension, coronary artery disease, dyslipidemia who underwent vitrectomy on 09/29/2013, presenting to her ophthalmologist office today for a followup visit. She had otherwise been in her usual state health, when she developed sudden onset mental status changes at the Ophthalmology office, described by her family members as initially having a blank stare. This occurred for 20-30 seconds after which she was noted to be confused and disoriented, unable to follow commands or speak. There was no loss of consciousness during this time. After this she was noted to have slurred speech and difficulties getting words out. In the emergency room she is able to follow commands however continues to have difficulties with getting words out. Initial CT scan of brain  did not show acute intracranial abnormalities. Patient was seen and evaluate by neurology while in the emergency department. She had been on aspirin 81 mg by mouth daily prior to this. She does not have a history of CVA or TIA. She denies shortness of breath, cough, fevers, chills, nausea, vomiting, head trauma, falls, prior history of encephalopathy,  abdominal pain,  Dysuria,  Hematuria, or lower extremity swelling.                                                                                                                                      Review of Systems:  Constitutional:  No weight loss, night sweats, Fevers, chills, fatigue.  HEENT:  No headaches, Difficulty swallowing,Tooth/dental problems,Sore throat,  No sneezing, itching, ear ache, nasal congestion, post nasal drip,  Cardio-vascular:  No chest pain, Orthopnea, PND, swelling in lower extremities, anasarca, dizziness,  palpitations  GI:  No heartburn, indigestion, abdominal pain, nausea, vomiting, diarrhea, change in bowel habits, loss of appetite  Resp:  No shortness of breath with exertion or at rest. No excess mucus, no productive cough, No non-productive cough, No coughing up of blood.No change in color of mucus.No wheezing.No chest wall deformity  Skin:  no rash or lesions.  GU:  no dysuria, change in color of urine, no urgency or frequency. No flank pain.  Musculoskeletal:  . No decreased range of motion. Positive for back pain, joint pain  Psych:  No change in mood or affect. No depression or anxiety. No memory loss.   Past Medical History  Diagnosis Date  . CHF (congestive heart failure)   . Coronary artery disease   . Hypertension   . Arthritis   . Edema of extremities   . Anxiety   . HLD (hyperlipidemia)    Past Surgical History  Procedure Laterality Date  . Total knee arthroplasty Bilateral     knee replacements  . Lumbar disc surgery    . Cholecystectomy    .  Posterior cervical fusion/foraminotomy N/A 12/28/2012    Procedure: POSTERIOR CERVICAL FUSION/FORAMINOTOMY LEVEL 3;  Surgeon: Barnett Abu, MD;  Location: [facility];  Service: Neurosurgery;  Laterality: N/A;  Posterior Cervical Three-Six Laminectomy and Fusion  . Angioplasty    . Tonsillectomy     Social History:  reports that she has never smoked. She has never used smokeless tobacco. She reports that she drinks alcohol. She reports that she does not use illicit drugs.  Allergies  Allergen Reactions  . Penicillins     Family History  Problem Relation Age of Onset  . Dementia Sister     x 3  . CAD Brother   . Breast cancer Sister     x 3  . Heart disease Sister   . Heart disease Son     x 2  . Alzheimer's disease Sister      Prior to Admission medications   Medication Sig Start Date End Date Taking? Authorizing Provider  acetaminophen (TYLENOL) 325 MG tablet Take 650 mg by mouth every 4 (four) hours as  needed for pain.    Historical Provider, MD  ALPRAZolam Prudy Feeler) 0.25 MG tablet Take 1 tablet every night at bedtime for anxiety 01/21/13   Lucrezia Starch, NP  aspirin EC 81 MG tablet Take 81 mg by mouth daily.    Historical Provider, MD  atorvastatin (LIPITOR) 10 MG tablet Take 10 mg by mouth daily.    Historical Provider, MD  ferrous sulfate 325 (65 FE) MG tablet Take 325 mg by mouth 2 (two) times daily.    Historical Provider, MD  furosemide (LASIX) 20 MG tablet Take 1 tablet (20 mg total) by mouth daily. 01/02/13   Meredeth Ide, MD  HYDROcodone-acetaminophen (NORCO) 10-325 MG per tablet Take 1 tablets every 6 hours as needed for mild pain(1-4) Take 2 tablets every 6 hours as needed for moderate to severe pain(5-10). 03/21/13   Tiffany L Reed, DO  lisinopril (PRINIVIL,ZESTRIL) 20 MG tablet Take 1 tablet (20 mg total) by mouth daily. 01/02/13   Meredeth Ide, MD  loratadine (CLARITIN) 10 MG tablet Take 10 mg by mouth as needed for allergies.    Historical Provider, MD  methocarbamol (ROBAXIN) 500 MG tablet Take 1 tablet (500 mg total) by mouth every 6 (six) hours as needed. 01/02/13   Meredeth Ide, MD  metoprolol (LOPRESSOR) 50 MG tablet Take 50 mg by mouth 2 (two) times daily.    Historical Provider, MD  MOVIPREP 100 G SOLR Use per prep instructions 03/14/13   Iva Boop, MD  Multiple Vitamin (MULTIVITAMIN WITH MINERALS) TABS Take 1 tablet by mouth daily.    Historical Provider, MD  polyethylene glycol powder (GLYCOLAX/MIRALAX) powder Take 17 g by mouth daily.    Historical Provider, MD  pramipexole (MIRAPEX) 0.125 MG tablet Take 0.25 mg by mouth at bedtime.    Historical Provider, MD  promethazine (PHENERGAN) 25 MG tablet Take 25 mg by mouth every 8 (eight) hours as needed for nausea.    Historical Provider, MD  Protein (PROCEL) POWD Take 1 scoop by mouth 3 (three) times daily.    Historical Provider, MD  senna-docusate (SENOKOT-S) 8.6-50 MG per tablet Take 1 tablet by mouth 2 (two) times daily. 01/02/13    Meredeth Ide, MD   Physical Exam: Filed Vitals:   09/30/13 1244  BP:   Pulse:   Temp: 97.6 F (36.4 C)  Resp:     BP 178/87  Pulse 72  Temp(Src) 97.6  F (36.4 C) (Oral)  Resp 15  SpO2 99%  General:  Appears calm and comfortable, although having difficulties getting words out, dysphasia Eyes: Right pupil 4 mm in diameter, left pupil 3 mm in diameter. Sluggish pupillary reflex to right, left pupil reactive to light. Extraocular movement is intact Neck: no LAD, masses or thyromegaly Cardiovascular: RRR, no m/r/g. No LE edema. Telemetry: SR, no arrhythmias  Respiratory: CTA bilaterally, no w/r/r. Normal respiratory effort. She has bibasilar crackles Abdomen: soft, ntnd Skin: no rash or induration seen on limited exam Musculoskeletal: grossly normal tone BUE/BLE Psychiatric: grossly normal mood and affect, speech fluent and appropriate Neurologic: Patient is dysphasia, difficulties getting words out, inappropriate responses to questions at times. She does not have facial droop or tongue deviation. Extraocular movement was intact, no alteration to sensation over face. Upper extremities having 5 out of 5 muscle strength 2+ deep tendon reflexes. Lower extremities bilateral 5 of 5 muscle strength 2+ deep tendon reflexes. Negative Babinski, heel to toe intact, no cerebellar signs           Labs on Admission:  Basic Metabolic Panel:  Recent Labs Lab 09/30/13 1102 09/30/13 1111  NA 139 140  K 4.3 4.2  CL 101 101  CO2 27  --   GLUCOSE 98 99  BUN 29* 29*  CREATININE 0.82 1.00  CALCIUM 9.6  --    Liver Function Tests:  Recent Labs Lab 09/30/13 1102  AST 32  ALT 26  ALKPHOS 137*  BILITOT 0.4  PROT 7.8  ALBUMIN 3.5   No results found for this basename: LIPASE, AMYLASE,  in the last 168 hours No results found for this basename: AMMONIA,  in the last 168 hours CBC:  Recent Labs Lab 09/30/13 1102 09/30/13 1111  WBC 11.7*  --   NEUTROABS 8.3*  --   HGB 10.6* 11.9*   HCT 32.7* 35.0*  MCV 90.8  --   PLT 299  --    Cardiac Enzymes:  Recent Labs Lab 09/30/13 1102  TROPONINI <0.30    BNP (last 3 results) No results found for this basename: PROBNP,  in the last 8760 hours CBG:  Recent Labs Lab 09/30/13 1203  GLUCAP 93    Radiological Exams on Admission: Ct Angio Head W/cm &/or Wo Cm  09/30/2013   CLINICAL DATA:  Right eye surgery 1 day ago for retinal disc abdomen. Developed garbled speech and possible syncopal episode  EXAM: CT ANGIOGRAPHY HEAD AND NECK  TECHNIQUE: Multidetector CT imaging of the head and neck was performed using the standard protocol during bolus administration of intravenous contrast. Multiplanar CT image reconstructions including MIPs were obtained to evaluate the vascular anatomy. Carotid stenosis measurements (when applicable) are obtained utilizing NASCET criteria, using the distal internal carotid diameter as the denominator.  CONTRAST:  50mL OMNIPAQUE IOHEXOL 350 MG/ML SOLN  COMPARISON:  Noncontrast CT head earlier today.  FINDINGS: CTA HEAD FINDINGS  Moderate cerebral and cerebellar atrophy. No cortical hypodensity. No abnormal postcontrast enhancement. Biparietal thinning. Major dural venous sinuses patent. Postop changes in the right globe.  50% cavernous ICA stenosis on the right. Calcific non stenotic atheromatous change in the cavernous region on the left. Widely patent supraclinoid ICAs bilaterally. Widely patent basilar artery with right vertebral dominant. Left vertebral ends in PICA. No intracranial stenosis or occlusion is evident.  Review of the MIP images confirms the above findings.  CTA NECK FINDINGS  Transverse arch heavily calcified with some mural thrombus proximally. Conventional branching of great vessels  from the arch. Moderate atheromatous change at the right and left subclavian origin. Right vertebral dominant with left vertebral hypoplastic.  Heavily calcified plaque at the right carotid bifurcation extending  into the proximal right internal carotid artery. Ratio of 2.9/ 4.6 proximal/distal is consistent with a less than 50% stenosis. No ulceration or soft plaque. No evidence for dissection.  Extensive heavily calcified left carotid bifurcation with severe luminal narrowing. Ratio of 0.8/5.2 proximal/distal consistent with a 80-90% stenosis. No ulceration or soft plaque. No evidence for dissection.  The patient is status post C3- C6 posterior fusion with laminectomy. Moderate anterolisthesis T1 on T2 is degenerative in nature. There is healing of the previously identified C5 fracture with restoration of normal alignment. Instrumentation grossly intact. There is fusion at least across the C3-4 interspace. There is nonunion of a type 2 odontoid fracture with mild canal stenosis on the right but no apparent vertebral artery compromise. No lung apex lesion. No neck masses. No osseous destructive lesion although skeletal osteopenia is noted.  Review of the MIP images confirms the above findings.  IMPRESSION: 80-90% heavily calcified left internal carotid artery stenosis. This is potentially flow reducing.  No intracranial stenosis, occlusion, or visible acute infarct.  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 No apparent vertebral artery compromise.  Previous C3 through C6 fusion with healing of C5 fracture.  Vascular Findings reviewed with ordering provider.   Electronically Signed   By: Davonna Belling M.D.   On: 09/30/2013 12:47   Ct Head Wo Contrast  09/30/2013   CLINICAL DATA:  Code stroke.  EXAM: CT HEAD WITHOUT CONTRAST  TECHNIQUE: Contiguous axial images were obtained from the base of the skull through the vertex without intravenous contrast.  COMPARISON:  12/25/2012  FINDINGS: There is no evidence of intra-axial nor extra-axial fluid collections, no evidence of acute hemorrhage. Diffuse cortical atrophy is identified. Mild areas of low attenuation project within the subcortical, deep  and periventricular white matter regions. Ventricles and cisterns are patent. There is no evidence of subfalcine or tonsillar herniation. Iatrogenic changes are appreciated within the right globe consistent with recent surgical intervention. There is mild mucosal thickening within the ethmoid air cells. Remaining visualized paranasal sinuses and mastoid air cells are patent. There is no evidence of a depressed skull fracture. The bones are osteopenic.  IMPRESSION: Involutional and chronic changes without evidence of acute intracranial abnormalities. Iatrogenic changes are appreciated within the right globe. These results were called by telephone at the time of interpretation on 09/30/2013 at 11:35 AM to Dr. Roseanne Reno, who verbally acknowledged these results.   Electronically Signed   By: Salome Holmes M.D.   On: 09/30/2013 11:36   Ct Angio Neck W/cm &/or Wo/cm  09/30/2013   CLINICAL DATA:  Right eye surgery 1 day ago for retinal disc abdomen. Developed garbled speech and possible syncopal episode  EXAM: CT ANGIOGRAPHY HEAD AND NECK  TECHNIQUE: Multidetector CT imaging of the head and neck was performed using the standard protocol during bolus administration of intravenous contrast. Multiplanar CT image reconstructions including MIPs were obtained to evaluate the vascular anatomy. Carotid stenosis measurements (when applicable) are obtained utilizing NASCET criteria, using the distal internal carotid diameter as the denominator.  CONTRAST:  50mL OMNIPAQUE IOHEXOL 350 MG/ML SOLN  COMPARISON:  Noncontrast CT head earlier today.  FINDINGS: CTA HEAD FINDINGS  Moderate cerebral and cerebellar atrophy. No cortical hypodensity. No abnormal postcontrast enhancement. Biparietal thinning. Major dural venous sinuses patent. Postop changes in  the right globe.  50% cavernous ICA stenosis on the right. Calcific non stenotic atheromatous change in the cavernous region on the left. Widely patent supraclinoid ICAs bilaterally.  Widely patent basilar artery with right vertebral dominant. Left vertebral ends in PICA. No intracranial stenosis or occlusion is evident.  Review of the MIP images confirms the above findings.  CTA NECK FINDINGS  Transverse arch heavily calcified with some mural thrombus proximally. Conventional branching of great vessels from the arch. Moderate atheromatous change at the right and left subclavian origin. Right vertebral dominant with left vertebral hypoplastic.  Heavily calcified plaque at the right carotid bifurcation extending into the proximal right internal carotid artery. Ratio of 2.9/ 4.6 proximal/distal is consistent with a less than 50% stenosis. No ulceration or soft plaque. No evidence for dissection.  Extensive heavily calcified left carotid bifurcation with severe luminal narrowing. Ratio of 0.8/5.2 proximal/distal consistent with a 80-90% stenosis. No ulceration or soft plaque. No evidence for dissection.  The patient is status post C3- C6 posterior fusion with laminectomy. Moderate anterolisthesis T1 on T2 is degenerative in nature. There is healing of the previously identified C5 fracture with restoration of normal alignment. Instrumentation grossly intact. There is fusion at least across the C3-4 interspace. There is nonunion of a type 2 odontoid fracture with mild canal stenosis on the right but no apparent vertebral artery compromise. No lung apex lesion. No neck masses. No osseous destructive lesion although skeletal osteopenia is noted.  Review of the MIP images confirms the above findings.  IMPRESSION: 80-90% heavily calcified left internal carotid artery stenosis. This is potentially flow reducing.  No intracranial stenosis, occlusion, or visible acute infarct.  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 No apparent vertebral artery compromise.  Previous C3 through C6 fusion with healing of C5 fracture.  Vascular Findings reviewed with ordering provider.    Electronically Signed   By: Davonna BellingJohn  Curnes M.D.   On: 09/30/2013 12:47    EKG: Independently reviewed. Sinus rhythm  Assessment/Plan Active Problems:   CVA (cerebral infarction)   Slurred speech   Coronary artery disease   Hypertension   1. Probable acute CVA. Patient presenting with dysphasia, sudden onset occurred earlier today. On presentation she was not found to be TPA candidate, although was seen and evaluated by neurology. Will increase her aspirin from 81 mg by mouth daily to 325 mg by mouth daily. Admitted to telemetry place her on the stroke protocol. Obtain an MRI of brain, transthoracic echocardiogram, carotid Dopplers. Start statin therapy with Lipitor 40 mg by mouth daily, followup on fasting lipid panel as well as hemoglobin A1c. Physical therapy, occupational therapy, speech path pathology and social work consult. Await further recommendations from neurology. 2. Hypertension. Patient presented with systolic blood pressures in the 170s to 180s. Will allow for permissive hypertension to favor cerebral perfusion in setting of stroke. 3. Dyslipidemia. Check a fasting lipid panel, start statin therapy with Lipitor 40 mg by mouth daily 4. Coronary artery disease. Stable, does not appear to have active cardiac issues. She denies chest pain, troponin negative with EKG not revealing ischemic changes. Continue antiplatelet therapy, beta blocker, statin. 5. Status post vitrectomy. Patient seen by her ophthalmologist today in the office 6. DVT prophylaxis. Lovenox     Code Status: Full code Family Communication: I spoke with patient's daughter at bedside Disposition Plan: Will admit patient to the inpatient service, I anticipate she will require greater than 2 night hospitalization  Time spent: 6870  minutes  Jeralyn Bennett [facility] Pager [phone]

## 2013-10-07 NOTE — Progress Notes (Signed)
Subjective/Complaints:  Review of Systems - Negative except legs moving at noc, diffficult to obtain secondary to aphasia  Objective: Vital Signs: Blood pressure 177/66, pulse 64, temperature 97.4 F (36.3 C), temperature source Oral, resp. rate 18, height '5\' 2"'  (1.575 m), weight 69 kg (152 lb 1.9 oz), SpO2 98.00%. No results found. Results for orders placed during the hospital encounter of 10/06/13 (from the past 72 hour(s))  CBC     Status: Abnormal   Collection Time    10/06/13  7:50 PM      Result Value Range   WBC 8.5  4.0 - 10.5 K/uL   RBC 3.70 (*) 3.87 - 5.11 MIL/uL   Hemoglobin 10.9 (*) 12.0 - 15.0 g/dL   HCT 33.8 (*) 36.0 - 46.0 %   MCV 91.4  78.0 - 100.0 fL   MCH 29.5  26.0 - 34.0 pg   MCHC 32.2  30.0 - 36.0 g/dL   RDW 15.6 (*) 11.5 - 15.5 %   Platelets 289  150 - 400 K/uL  CREATININE, SERUM     Status: Abnormal   Collection Time    10/06/13  7:50 PM      Result Value Range   Creatinine, Ser 0.90  0.50 - 1.10 mg/dL   GFR calc non Af Amer 59 (*) >90 mL/min   GFR calc Af Amer 68 (*) >90 mL/min   Comment: (NOTE)     The eGFR has been calculated using the CKD EPI equation.     This calculation has not been validated in all clinical situations.     eGFR's persistently <90 mL/min signify possible Chronic Kidney     Disease.     HEENT: normal Cardio: RRR and no murmur Resp: CTA B/L and unlabored GI: BS positive and non distended Extremity:  Pulses positive and No Edema Skin:   Other healed bilat TKR incisions Neuro: Cranial Nerve II-XII normal, Abnormal Sensory cannot assess secondary to aphasia, Abnormal Motor 4-/5 RUE and 4/5 RLE, 5-/5 on left side, Abnormal FMC Ataxic/ dec FMC and Aphasic Musc/Skel:  Other chronic bilateral wrist joint swelling Gen NAD   Assessment/Plan: 1. Functional deficits secondary to Left MCA infarct which require 3+ hours per day of interdisciplinary therapy in a comprehensive inpatient rehab setting. Physiatrist is providing close team  supervision and 24 hour management of active medical problems listed below. Physiatrist and rehab team continue to assess barriers to discharge/monitor patient progress toward functional and medical goals. FIM:                   Comprehension Comprehension Mode: Auditory Comprehension: 3-Understands basic 50 - 74% of the time/requires cueing 25 - 50%  of the time  Expression Expression Mode: Verbal Expression: 3-Expresses basic 50 - 74% of the time/requires cueing 25 - 50% of the time. Needs to repeat parts of sentences.  Social Interaction Social Interaction: 4-Interacts appropriately 75 - 89% of the time - Needs redirection for appropriate language or to initiate interaction.  Problem Solving Problem Solving: 2-Solves basic 25 - 49% of the time - needs direction more than half the time to initiate, plan or complete simple activities  Memory Memory: 2-Recognizes or recalls 25 - 49% of the time/requires cueing 51 - 75% of the time  Medical Problem List and Plan:  1. Acute left temporoparietal infarct secondary to left carotid stenosis  2. DVT Prophylaxis/Anticoagulation: Subcutaneous Lovenox. Monitor platelet counts and any signs of bleeding  3. Pain Management: Tylenol as needed.Voltaren Gel as  needed for knee pain  4. Mood/anxiety: Xanax 0.25 mg each bedtime as needed  5. Neuropsych: This patient is capable of making decisions on her own behalf.  6. Left ICA stenosis. Followup vascular surgery/Dr. Oneida Alar outpatient question stent versus CEA 4-6 weeks  7. CAD/LVD. Followup cardiology services plan [place] and followup appointment second week in February as outpatient  8. Hypertension. Lopressor 50 mg twice a day. Monitor with increased mobility  9. Systolic congestive heart failure. Lasix 20 mg daily. Monitor for any signs of fluid overload  10. Vitrectomy 09/29/2013. Followup ophthalmology services  11. Stage III cocygeal ulcer. Wound care nurse followup. Skin care as  directed  12. Hyperlipidemia. Lipitor   LOS (Days) 1 A FACE TO FACE EVALUATION WAS PERFORMED  KIRSTEINS,ANDREW E 10/07/2013, 6:20 AM

## 2013-10-07 NOTE — Evaluation (Addendum)
Physical Therapy Assessment and Plan  Patient Details  Name: [name] MRN: [id number] Date of Birth: May 19, 1931  PT Diagnosis: Abnormal posture, Abnormality of gait, Hemiparesis dominant, Muscle weakness and Pain in coccygeal region Rehab Potential:  Good ELOS:   6-8 days  Today's Date: 10/07/2013 Time: 0900-1000 and 1300-1345 Time Calculation (min): 60 min and 45 min  Problem List:  Patient Active Problem List   Diagnosis Date Noted  . UTI (urinary tract infection) 10/01/2013  . Occlusion and stenosis of carotid artery with cerebral infarction 10/01/2013  . CVA (cerebral infarction) 09/30/2013  . Slurred speech 09/30/2013  . Anemia of other chronic disease 03/11/2013  . Osteoporosis 12/25/2012  . Spinal stenosis of lumbar region 12/25/2012  . Chronic back pain 12/25/2012  . Hip pain- LEFT 12/25/2012  . AKI (acute kidney injury) 12/25/2012  . Fall 12/25/2012  . Anemia 12/25/2012  . Hyponatremia 12/25/2012  . Cervical spine fracture 12/25/2012  . RLS (restless legs syndrome) 12/25/2012  . Varicose veins 12/25/2012  . CHF (congestive heart failure)   . Coronary artery disease   . Hypertension   . Arthritis     Past Medical History:  Past Medical History  Diagnosis Date  . CHF (congestive heart failure)   . Coronary artery disease   . Hypertension   . Arthritis   . Edema of extremities   . Anxiety   . HLD (hyperlipidemia)    Past Surgical History:  Past Surgical History  Procedure Laterality Date  . Total knee arthroplasty Bilateral     knee replacements  . Lumbar disc surgery    . Cholecystectomy    . Posterior cervical fusion/foraminotomy N/A 12/28/2012    Procedure: POSTERIOR CERVICAL FUSION/FORAMINOTOMY LEVEL 3;  Surgeon: Kristeen Miss, MD;  Location: [facility];  Service: Neurosurgery;  Laterality: N/A;  Posterior Cervical Three-Six Laminectomy and Fusion  . Angioplasty    . Tonsillectomy      Assessment & Plan Clinical Impression: An 80 y.o.  [name] with history of CHF, CAD, C2 dens fracture with left sided weakness; who underwent vitrectomy on 09/29/13 and on follow up in office next day developed acute onset of confusion and inability to talk. CT head without acute changes. CTA head/neck with 80-90% heavily calcified left internal carotid artery stenosis with potentially flow reduction and no intracranial stenosis, occlusion, or visible acute infarct. MRI brain done revealing acute left MCA infarct predominantly involving the temporoparietal region. 2D echo with EF 40-45% with moderate hypokinesis, grade 2 diastolic dysfunction and mild to moderate MVR. Dr Oneida Alar consulted and recommends stent v/s CEA in 4-6 weeks for symptomatic carotid stenosis. Dr. Wynonia Lawman consulted for input on echo and recommended medical management as patient without clinical HF. Neurology recommended increasing ASA to 325 mg daily for stroke due to L-CAS. Maintained on subcutaneous Lovenox for DVT prophylaxis. She is tolerating a regular diet. WOC consulted for input on stage III cocygeal ulcer and calcium alginate ordered for treatment. Physical occupational therapy evaluations completed an ongoing with recommendations for physical medicine rehabilitation consult to consider inpatient rehabilitation services. Patient transferred to CIR on 10/06/2013 .   Patient currently requires min-mod with mobility secondary to muscle weakness, decreased cardiorespiratoy endurance and decreased standing balance, decreased postural control, decreased balance strategies and hemiparesis.  Prior to hospitalization, patient was supervision with mobility and lived with Family;Other (Comment) (pt has help from family and CNA/hired help) in a House home.  Home access is  Ramped entrance.  Patient will benefit from skilled PT  intervention to maximize safe functional mobility and minimize fall risk for planned discharge home with 24 hour supervision.  Anticipate patient will benefit from follow  up [place] at discharge.  PT Plan PT Treatment/Interventions: Ambulation/gait training;Balance/vestibular training;Cognitive remediation/compensation;Functional mobility training;Discharge planning;Disease management/prevention;Skin care/wound management;Patient/family education;DME/adaptive equipment instruction;Pain management;Neuromuscular re-education;Stair training;Therapeutic Activities;Therapeutic Exercise;Wheelchair propulsion/positioning;UE/LE Strength taining/ROM;UE/LE Coordination activities PT Transfers Anticipated Outcome(s): Supervision PT Locomotion Anticipated Outcome(s): Supervision PT Recommendation Follow Up Recommendations: Home health PT Patient destination: Home Equipment Recommended: To be determined Equipment Details: Pt owns personal rolling walker. Pending daughter report of current w/c cushion, may need specialty cushion.  Skilled Therapeutic Intervention Treatment Session 1: PT evaluation initiated. See below for detailed findings. Treatment initiated. Session focused on functional tranfers, gait training, initiation of education on w/c parts management and pressure relief for wound management. Per pt report of pain in coccygeal region, this PT retrieved Roho w/c cushion. Educated pt on importance of pressure relief every 30 minutes for skin integrity, wound management. Performed gait x45' in controlled environment with rolling walker and min A. See below for details pertaining to gait assistance/cueing. Therapist departed with pt seated in w/c with all needs within reach.  Treatment Session 2: Pt received seated in w/c accompanied by daughters, Vickii Chafe and Mardene Celeste. Pt performed w/c mobility x60' in controlled environment with bilat UE's, supervision.  Negotiation of 3 stairs (for bilat LE strengthening and NMR) with bilat rails, mod A. Gait x10' in controlled environment with rolling walker and min A.  Educated daughters on importance of pressure relief every 30  minutes.Explained and demonstrated the following pressure relief techniques: A/P, P/A (daughter gave effective return demonstration), lateral, and partial stand with rolling walker. Pt reports cervical spine discomfort with anterior and lateral pressure relief techniques. Pt therefore instructed to perform 10 partial stands from locked w/c every 30 minutes with rolling walker. Therapist placed signs in room to facilitate carryover of pressure relief technique, frequency. Stand pivot transfer from w/c to bedside chair with rolling walker and min A. Therapist departed room with pt seated in bedside chair with daughter present and all needs within reach.  PT Evaluation Precautions/Restrictions  Fall General   Vital SignsTherapy Vitals Temp: 97.4 F (36.3 C) Temp src: Oral Pulse Rate: 63 Resp: 18 BP: 169/56 mmHg Patient Position, if appropriate: Sitting Oxygen Therapy SpO2: 100 % O2 Device: None (Room air) Pain Pain Assessment Pain Assessment: 0-10 Pain Score: 4  Pain Type: Acute pain Pain Location: Buttocks Pain Orientation: Other (Comment) (coccyx) Pain Descriptors / Indicators: Aching Pain Onset: Gradual Patients Stated Pain Goal: 2 Pain Intervention(s): Repositioned;Other (Comment) (chnaged w/c cushion; per pt, RN aware and had already given medication) Multiple Pain Sites: No Home Living/Prior Functioning Home Living Home Access: Ramped entrance Home Layout: One level Additional Comments: Pt lives alone with 24-hour assistance/supervision via daughter, hired Quarry manager, and aid.  Lives With: Family;Other (Comment) Prior Function Level of Independence: Needs assistance with homemaking;Needs assistance with ADLs;Needs assistance with gait;Requires assistive device for independence (Supervision with gait)  Able to Take Stairs?: No Leisure: Hobbies-yes (Comment) Comments: Reading, playing Bingo Vision/Perception  Vision - History Baseline Vision: Wears glasses only for reading Visual  History: Other (comment) (Virectomy surgery 09/29/13; currently low vision)  Cognition Overall Cognitive Status: Within Functional Limits for tasks assessed Sensation Sensation Light Touch: Impaired by gross assessment Hot/Cold: Appears Intact Proprioception: Appears Intact Additional Comments: Pt reports diminished light touch (compared to LLE) on dorsum of R foot. Coordination Gross Motor Movements are Fluid and Coordinated: No Motor  Motor  Motor: Other (comment);Abnormal postural alignment and control Motor - Skilled Clinical Observations: Bilat LE weakness (more prominent in RLE); significant thoracic kyphosis with standing/walking  Mobility Bed Mobility Bed Mobility: Supine to Sit;Sit to Supine Supine to Sit: 3: Mod assist Sit to Supine: 3: Mod assist Transfers Transfers: Yes Sit to Stand: 4: Min assist;From chair/3-in-1;With armrests Sit to Stand Details: Verbal cues for technique;Verbal cues for safe use of DME/AE Sit to Stand Details (indicate cue type and reason): Verbal cueing for setup, body mechanics/technique and safe hand placement on rolling walker. Stand to Sit: 4: Min assist Stand to Sit Details (indicate cue type and reason): Verbal cues for technique;Verbal cues for safe use of DME/AE Stand to Sit Details: Verbal cueing for hand placement, for safe positioning prior to sitting; min A to control descent. Locomotion  Ambulation Ambulation: Yes Ambulation/Gait Assistance: 4: Min assist Ambulation Distance (Feet): 45 Feet Assistive device: Rolling walker Ambulation/Gait Assistance Details: Gait x45' in controlled environment with rolling walker, min A, and increased time. Onset of significant fatigue after ~35' of gait. Gait Gait: Yes Gait Pattern: Impaired Gait Pattern: Step-through pattern;Trunk flexed;Decreased dorsiflexion - right;Decreased stride length;Decreased hip/knee flexion - left;Trendelenburg;Decreased hip/knee flexion - right;Decreased stance time -  right;Decreased step length - left Gait velocity: decreased  Trunk/Postural Assessment  Cervical Assessment Cervical Assessment: Within Functional Limits Thoracic Assessment Thoracic Assessment: Exceptions to [facility] Thoracic Strength Overall Thoracic Strength: Deficits Overall Thoracic Strength Comments: Decreased strength/endurance in thoracic spine extensors. When seated in w/c, thoracic spine extension markedly improved but cervicothoracic extension less than normal ROM. Lumbar Assessment Lumbar Assessment: Within Functional Limits Postural Control Postural Control: Deficits on evaluation Righting Reactions: Hip strategy appears intact. Ankle strategy (L>R) noted with A/P balance perturbations. No stepping strategy noted. Postural Limitations: Thoracic hyperkyphosis, lower cervical spine extension in standing. Posterior pelvic tilt in seated.  Balance Balance Balance Assessed: Yes Static Sitting Balance Static Sitting - Balance Support: Feet supported;No upper extremity supported Static Sitting - Level of Assistance: 7: Independent Dynamic Sitting Balance Dynamic Sitting - Balance Support: No upper extremity supported;Feet supported;During functional activity Dynamic Sitting - Level of Assistance: 5: Stand by assistance Dynamic Sitting - Balance Activities: Forward lean/weight shifting Sitting balance - Comments: When attempting A/P pressure relief (seated in w/c x3 minutes), pt requires SBA secondary to decreased postural stability, pt fear of falling forward. Static Standing Balance Static Standing - Balance Support: Bilateral upper extremity supported Static Standing - Level of Assistance: 4: Min assist Static Standing - Comment/# of Minutes: Static standing with bilat Ue support at rolling walker x2 minutes with min guard. Dynamic Standing Balance Dynamic Standing - Balance Support: Bilateral upper extremity supported;During functional activity Dynamic Standing - Level of  Assistance: 5: Stand by assistance;4: Min assist Dynamic Standing - Balance Activities: Forward lean/weight shifting Dynamic Standing - Comments: Performed partial stands from w/c with rolling walker (for pressure relief) and min guard for initial 2 reps, SBA for final 3 reps. Extremity Assessment  RLE Assessment RLE Assessment: Exceptions to [facility] RLE Strength RLE Overall Strength: Deficits RLE Overall Strength Comments: Based on observation of functional mobility, grossly 4-/5 to 4/5. LLE Assessment LLE Assessment: Exceptions to [facility] LLE Strength LLE Overall Strength: Deficits LLE Overall Strength Comments: Based on observation of functional mobility, grossly 4/5.  FIM:  FIM - Control and instrumentation engineer Devices: Copy: 4: Bed > Chair or W/C: Min A (steadying Pt. > 75%);4: Chair or W/C > Bed: Min A (steadying Pt. > 75%) FIM - Locomotion:  Wheelchair Locomotion: Wheelchair: 0: Activity did not occur (secondary to time constraint) FIM - Locomotion: Ambulation Locomotion: Ambulation Assistive Devices: Administrator Ambulation/Gait Assistance: 4: Min assist Locomotion: Ambulation: 1: Travels less than 50 ft with minimal assistance (Pt.>75%) FIM - Locomotion: Stairs Locomotion: Stairs: 0: Activity did not occur (secondary to time constraint)  FIM - Locomotion: Wheelchair Distance: 60 FIM - Locomotion: Ambulation Ambulation/Gait Assistance: 4: Min assist   Refer to Care Plan for Long Term Goals  Recommendations for other services: None  Discharge Criteria: Patient will be discharged from PT if patient refuses treatment 3 consecutive times without medical reason, if treatment goals not met, if there is a change in medical status, if patient makes no progress towards goals or if patient is discharged from hospital.  The above assessment, treatment plan, treatment alternatives and goals were discussed and mutually agreed upon: by patient and by  family  Stefano Gaul 10/07/2013, 4:46 PM

## 2013-10-07 NOTE — Progress Notes (Signed)
Patient information reviewed and entered into eRehab system by Kyna Blahnik, RN, CRRN, PPS Coordinator.  Information including medical coding and functional independence measure will be reviewed and updated through discharge.     Per nursing patient was given "Data Collection Information Summary for Patients in Inpatient Rehabilitation Facilities with attached "Privacy Act Statement-Health Care Records" upon admission.  

## 2013-10-08 ENCOUNTER — Encounter (HOSPITAL_COMMUNITY): Payer: Medicare Other

## 2013-10-08 ENCOUNTER — Inpatient Hospital Stay (HOSPITAL_COMMUNITY): Payer: Medicare Other

## 2013-10-08 ENCOUNTER — Inpatient Hospital Stay (HOSPITAL_COMMUNITY): Payer: Medicare Other | Admitting: Speech Pathology

## 2013-10-08 DIAGNOSIS — I6330 Cerebral infarction due to thrombosis of unspecified cerebral artery: Secondary | ICD-10-CM

## 2013-10-08 DIAGNOSIS — G8110 Spastic hemiplegia affecting unspecified side: Secondary | ICD-10-CM

## 2013-10-08 MED ORDER — PRAMIPEXOLE DIHYDROCHLORIDE 0.25 MG PO TABS
0.5000 mg | ORAL_TABLET | Freq: Every day | ORAL | Status: DC
  Administered 2013-10-08 – 2013-10-13 (×6): 0.5 mg via ORAL
  Filled 2013-10-08 (×8): qty 2

## 2013-10-08 NOTE — Progress Notes (Signed)
Occupational Therapy Session Note  Patient Details  Name: [name] MRN: [id number] Date of Birth: 06-19-1931  Today's Date: 10/08/2013 Time: 1300-1330 Time Calculation (min): 30 min  Short Term Goals: Week 1:  OT Short Term Goal 1 (Week 1): Focus on LTGs  Skilled Therapeutic Interventions/Progress Updates:    Pt seen for 1:1 OT session with focus on sit<>stand, functional transfers, and bed mobility. Pt receive sitting in recliner chair with RN present needing to assist with wound care. Pt completed stand pivot transfer chair>bed with min assist and min assist for sit>supine to manage RLE onto bed. Completed rolling in bed with supervision for wound care. Completed supine>sit at supervision. Pt required 2 attempts for managing clothing around waist d/t fatigue and min assist for balance. Pt practiced sit<>supine 3x requiring min assist for sit>supine and supervision supine>sit. Completed sit<>stand x5 from recliner chair with min assist. At end of session pt left sitting in recliner chair with all needs in reach.   Therapy Documentation Precautions:  Precautions Precautions: Fall Restrictions Weight Bearing Restrictions: No General:   Vital Signs:   Pain: No report of pain during therapy session   See FIM for current functional status  Therapy/Group: Individual Therapy  Ryheem Jay, Vara GuardianKayla N 10/08/2013, 3:00 PM

## 2013-10-08 NOTE — Progress Notes (Signed)
Subjective/Complaints:  Review of Systems - overall doing fairly well except for RLS symptoms at night. Pleased with therapy progress  Objective: Vital Signs: Blood pressure 157/73, pulse 60, temperature 97.4 F (36.3 C), temperature source Oral, resp. rate 19, height '5\' 2"'  (1.575 m), weight 69 kg (152 lb 1.9 oz), SpO2 97.00%. No results found. Results for orders placed during the hospital encounter of 10/06/13 (from the past 72 hour(s))  CBC     Status: Abnormal   Collection Time    10/06/13  7:50 PM      Result Value Range   WBC 8.5  4.0 - 10.5 K/uL   RBC 3.70 (*) 3.87 - 5.11 MIL/uL   Hemoglobin 10.9 (*) 12.0 - 15.0 g/dL   HCT 33.8 (*) 36.0 - 46.0 %   MCV 91.4  78.0 - 100.0 fL   MCH 29.5  26.0 - 34.0 pg   MCHC 32.2  30.0 - 36.0 g/dL   RDW 15.6 (*) 11.5 - 15.5 %   Platelets 289  150 - 400 K/uL  CREATININE, SERUM     Status: Abnormal   Collection Time    10/06/13  7:50 PM      Result Value Range   Creatinine, Ser 0.90  0.50 - 1.10 mg/dL   GFR calc non Af Amer 59 (*) >90 mL/min   GFR calc Af Amer 68 (*) >90 mL/min   Comment: (NOTE)     The eGFR has been calculated using the CKD EPI equation.     This calculation has not been validated in all clinical situations.     eGFR's persistently <90 mL/min signify possible Chronic Kidney     Disease.  CBC WITH DIFFERENTIAL     Status: Abnormal   Collection Time    10/07/13  5:55 AM      Result Value Range   WBC 10.2  4.0 - 10.5 K/uL   RBC 3.59 (*) 3.87 - 5.11 MIL/uL   Hemoglobin 10.6 (*) 12.0 - 15.0 g/dL   HCT 32.5 (*) 36.0 - 46.0 %   MCV 90.5  78.0 - 100.0 fL   MCH 29.5  26.0 - 34.0 pg   MCHC 32.6  30.0 - 36.0 g/dL   RDW 15.4  11.5 - 15.5 %   Platelets 259  150 - 400 K/uL   Neutrophils Relative % 62  43 - 77 %   Neutro Abs 6.3  1.7 - 7.7 K/uL   Lymphocytes Relative 26  12 - 46 %   Lymphs Abs 2.7  0.7 - 4.0 K/uL   Monocytes Relative 8  3 - 12 %   Monocytes Absolute 0.8  0.1 - 1.0 K/uL   Eosinophils Relative 4  0 - 5 %   Eosinophils Absolute 0.4  0.0 - 0.7 K/uL   Basophils Relative 1  0 - 1 %   Basophils Absolute 0.1  0.0 - 0.1 K/uL  COMPREHENSIVE METABOLIC PANEL     Status: Abnormal   Collection Time    10/07/13  5:55 AM      Result Value Range   Sodium 142  137 - 147 mEq/L   Potassium 4.3  3.7 - 5.3 mEq/L   Chloride 104  96 - 112 mEq/L   CO2 27  19 - 32 mEq/L   Glucose, Bld 88  70 - 99 mg/dL   BUN 19  6 - 23 mg/dL   Creatinine, Ser 0.83  0.50 - 1.10 mg/dL   Calcium 9.3  8.4 -  10.5 mg/dL   Total Protein 6.8  6.0 - 8.3 g/dL   Albumin 2.9 (*) 3.5 - 5.2 g/dL   AST 31  0 - 37 U/L   ALT 23  0 - 35 U/L   Alkaline Phosphatase 117  39 - 117 U/L   Total Bilirubin 0.3  0.3 - 1.2 mg/dL   GFR calc non Af Amer 65 (*) >90 mL/min   GFR calc Af Amer 75 (*) >90 mL/min   Comment: (NOTE)     The eGFR has been calculated using the CKD EPI equation.     This calculation has not been validated in all clinical situations.     eGFR's persistently <90 mL/min signify possible Chronic Kidney     Disease.     HEENT: normal Cardio: RRR and no murmur Resp: CTA B/L and unlabored GI: BS positive and non distended Extremity:  Pulses positive and No Edema Skin:   Other healed bilat TKR incisions Neuro: Cranial Nerve II-XII normal, Abnormal Sensory on right  Abnormal Motor 4-/5 RUE and 4/5 RLE, 5-/5 on left side, Abnormal FMC Ataxic/ dec FMC and Aphasic Musc/Skel:  Other chronic bilateral wrist joint swelling Gen NAD   Assessment/Plan: 1. Functional deficits secondary to Left MCA infarct which require 3+ hours per day of interdisciplinary therapy in a comprehensive inpatient rehab setting. Physiatrist is providing close team supervision and 24 hour management of active medical problems listed below. Physiatrist and rehab team continue to assess barriers to discharge/monitor patient progress toward functional and medical goals. FIM:    FIM - Upper Body Dressing/Undressing Upper body dressing/undressing steps patient  completed: Thread/unthread right bra strap;Thread/unthread left bra strap;Thread/unthread right sleeve of pullover shirt/dresss;Thread/unthread left sleeve of pullover shirt/dress;Put head through opening of pull over shirt/dress;Pull shirt over trunk Upper body dressing/undressing: 4: Min-Patient completed 75 plus % of tasks FIM - Lower Body Dressing/Undressing Lower body dressing/undressing steps patient completed: Thread/unthread left underwear leg;Pull underwear up/down;Pull pants up/down Lower body dressing/undressing: 2: Max-Patient completed 25-49% of tasks     FIM - Radio producer Devices: Insurance account manager Transfers: 4-To toilet/BSC: Min A (steadying Pt. > 75%);4-From toilet/BSC: Min A (steadying Pt. > 75%)  FIM - Bed/Chair Transfer Bed/Chair Transfer Assistive Devices: Adult nurse Transfer: 3: Supine > Sit: Mod A (lifting assist/Pt. 50-74%/lift 2 legs;3: Sit > Supine: Mod A (lifting assist/Pt. 50-74%/lift 2 legs);4: Bed > Chair or W/C: Min A (steadying Pt. > 75%);4: Chair or W/C > Bed: Min A (steadying Pt. > 75%)  FIM - Locomotion: Wheelchair Distance: 60 Locomotion: Wheelchair: 2: Travels 50 - 149 ft with supervision, cueing or coaxing FIM - Locomotion: Ambulation Locomotion: Ambulation Assistive Devices: Administrator Ambulation/Gait Assistance: 4: Min assist Locomotion: Ambulation: 1: Travels less than 50 ft with minimal assistance (Pt.>75%)  Comprehension Comprehension Mode: Auditory Comprehension: 5-Follows basic conversation/direction: With extra time/assistive device  Expression Expression Mode: Verbal Expression: 3-Expresses basic 50 - 74% of the time/requires cueing 25 - 50% of the time. Needs to repeat parts of sentences.  Social Interaction Social Interaction: 5-Interacts appropriately 90% of the time - Needs monitoring or encouragement for participation or interaction.  Problem Solving Problem Solving: 5-Solves basic problems:  With no assist  Memory Memory: 6-More than reasonable amt of time  Medical Problem List and Plan:  1. Acute left temporoparietal infarct secondary to left carotid stenosis  2. DVT Prophylaxis/Anticoagulation: Subcutaneous Lovenox. Monitor platelet counts and any signs of bleeding  3. Pain Management: Tylenol as needed.Voltaren Gel as needed  for knee pain  4. Mood/anxiety: Xanax 0.25 mg each bedtime as needed  5. Neuropsych: This patient is capable of making decisions on her own behalf.  6. Left ICA stenosis. Followup vascular surgery/Dr. Oneida Alar outpatient question stent versus CEA 4-6 weeks  7. CAD/LVD. Followup cardiology services plan [place] and followup appointment second week in February as outpatient  8. Hypertension. Lopressor 50 mg twice a day. Monitor with increased mobility  9. Systolic congestive heart failure. Lasix 20 mg daily. Monitor for any signs of fluid overload  10. Vitrectomy 09/29/2013. Followup ophthalmology services  11. Stage III cocygeal ulcer. Wound care nurse followup. Skin care as directed  12. Hyperlipidemia. Lipitor 13. RLS--increase scheduled mirapex to 0.103m qhs   LOS (Days) 2 A FACE TO FACE EVALUATION WAS PERFORMED  SWARTZ,ZACHARY T 10/08/2013, 8:52 AM

## 2013-10-08 NOTE — Progress Notes (Signed)
Speech Language Pathology Daily Session Note  Patient Details  Name: [name] MRN: [id number] Date of Birth: May 24, 1931  Today's Date: 10/08/2013 Time: [id number] Time Calculation (min): 27 min  Short Term Goals: Week 1: SLP Short Term Goal 1 (Week 1): Pt will communicate wants/needs with supervision level cues SLP Short Term Goal 2 (Week 1): Pt will utilize word-finding strategies in conversation with Min cues  Skilled Therapeutic Interventions: Skilled treatment session focused on addressing word finding goal. SLP facilitated session with verbal, demonstration of education regarding word finding strategies; handout previously regarding information was reviewed and patient required Mod faded to Min verbal cues to utilize description in a structured activity.  Continue plan of care.   FIM:  Comprehension Comprehension Mode: Auditory Comprehension: 5-Follows basic conversation/direction: With no assist Expression Expression Mode: Verbal Expression: 3-Expresses basic 50 - 74% of the time/requires cueing 25 - 50% of the time. Needs to repeat parts of sentences. Social Interaction Social Interaction: 5-Interacts appropriately 90% of the time - Needs monitoring or encouragement for participation or interaction. Problem Solving Problem Solving: 4-Solves basic 75 - 89% of the time/requires cueing 10 - 24% of the time Memory Memory: 4-Recognizes or recalls 75 - 89% of the time/requires cueing 10 - 24% of the time  Pain Pain Assessment Pain Assessment: No/denies pain  Therapy/Group: Individual Therapy  Charlane FerrettiMelissa Sydnei Ohaver, M.A., CCC-SLP [phone]  [name] 10/08/2013, 3:34 PM

## 2013-10-09 ENCOUNTER — Inpatient Hospital Stay (HOSPITAL_COMMUNITY): Payer: Medicare Other | Admitting: Physical Therapy

## 2013-10-09 ENCOUNTER — Inpatient Hospital Stay (HOSPITAL_COMMUNITY): Payer: Medicare Other

## 2013-10-09 NOTE — Plan of Care (Signed)
Problem: RH SKIN INTEGRITY Goal: RH STG MAINTAIN SKIN INTEGRITY WITH ASSISTANCE STG Maintain Skin Integrity With max Assistance.  Outcome: Progressing Pt instructed to stand q30 min for if sitting in chair to relieve pressure

## 2013-10-09 NOTE — Progress Notes (Signed)
Physical Therapy Session Note  Patient Details  Name: [name] MRN: [id number] Date of Birth: 1931/07/21  Today's Date: 10/09/2013 Time: 1100-1200 session one             1330-1400 session two  Time Calculation (min): 60 min session one                                              45 min session two  Short Term Goals: Week 1:  PT Short Term Goal 1 (Week 1): STG's=LTG's secondary to ELOS  Pt seen in two treatment sessions.   Skilled Therapeutic Interventions/Progress Updates:   Session one.    Pt received sitting in wheelchair with pt's daughter Elease Hashimotoatricia present. Pt performed wheelchair mobility for 4765' with vcs to improve propulsion with B UEs an LEs, Gait training with RW with wheelchair behind for 1660' with min A with vcs to improve upright posture, pt displays decreased heel strike,cadence and step length. Stand pivot transfers to mat with pt displaying difficulty with anterior weight shift and forward trunk flexion, vcs and pt displaying sliding of feet and posterior lean, slightly improved after 3 trials. Therapist instructed B LE strengthening for seated ankle pumps/rocks, LAQs, marching and single leg flexion x 10 reps each. Sit to stand from various levels on mat table with min A with tactile cues and manual facilitation to improve sequencing and posture. Pt and family education on proper techniques and safety with transfers, will require continued reinforcment.   Session two:   Pt received sitting in wheelchair. Pt performed wheelchair mobility x 50 ' with min vcs with improved technique and carryover from am session. Stand pivot transfer wheelchair to mat with improved technique and forward weight shift x 3. Sitting dynamic balance with reaching at various heights and anterior to improve trunk mobility to improve transfers. Supine bridging and single knee to chest with pt displaying difficulty with laying supine due to cervical posture, B flexed knees and R shoulder, only able  to tolerate short time in supine. Gait training for 1630' with directional training.   Therapy Documentation Precautions:  Precautions Precautions: Fall Restrictions Weight Bearing Restrictions: No    Pain: Pt reports 3/10 pain in cervical region and L shoulder, premedicated. Both sessions      Locomotion : Ambulation Ambulation/Gait Assistance: 4: Min assist                See FIM for current functional status  Therapy/Group: Individual Therapy both sessions.   Shiela MayerCasey, Bertran Zeimet A 10/09/2013, 4:30 PM

## 2013-10-09 NOTE — Progress Notes (Signed)
Subjective/Complaints:  Review of Systems -RLS symptoms perhaps a little better last night with increased mirapex. Therapies progressing well. Denies sob/cough, cp  Objective: Vital Signs: Blood pressure 149/66, pulse 56, temperature 97.4 F (36.3 C), temperature source Oral, resp. rate 18, height _0  (1.575 m), weight 69 kg (152 lb 1.9 oz), SpO2 95.00%. No results found. Results for orders placed during the hospital encounter of 10/06/13 (from the past 72 hour(s))  CBC     Status: Abnormal   Collection Time    10/06/13  7:50 PM      Result Value Range   WBC 8.5  4.0 - 10.5 K/uL   RBC 3.70 (*) 3.87 - 5.11 MIL/uL   Hemoglobin 10.9 (*) 12.0 - 15.0 g/dL   HCT 33.8 (*) 36.0 - 46.0 %   MCV 91.4  78.0 - 100.0 fL   MCH 29.5  26.0 - 34.0 pg   MCHC 32.2  30.0 - 36.0 g/dL   RDW 15.6 (*) 11.5 - 15.5 %   Platelets 289  150 - 400 K/uL  CREATININE, SERUM     Status: Abnormal   Collection Time    10/06/13  7:50 PM      Result Value Range   Creatinine, Ser 0.90  0.50 - 1.10 mg/dL   GFR calc non Af Amer 59 (*) >90 mL/min   GFR calc Af Amer 68 (*) >90 mL/min   Comment: (NOTE)     The eGFR has been calculated using the CKD EPI equation.     This calculation has not been validated in all clinical situations.     eGFR's persistently <90 mL/min signify possible Chronic Kidney     Disease.  CBC WITH DIFFERENTIAL     Status: Abnormal   Collection Time    10/07/13  5:55 AM      Result Value Range   WBC 10.2  4.0 - 10.5 K/uL   RBC 3.59 (*) 3.87 - 5.11 MIL/uL   Hemoglobin 10.6 (*) 12.0 - 15.0 g/dL   HCT 32.5 (*) 36.0 - 46.0 %   MCV 90.5  78.0 - 100.0 fL   MCH 29.5  26.0 - 34.0 pg   MCHC 32.6  30.0 - 36.0 g/dL   RDW 15.4  11.5 - 15.5 %   Platelets 259  150 - 400 K/uL   Neutrophils Relative % 62  43 - 77 %   Neutro Abs 6.3  1.7 - 7.7 K/uL   Lymphocytes Relative 26  12 - 46 %   Lymphs Abs 2.7  0.7 - 4.0 K/uL   Monocytes Relative 8  3 - 12 %   Monocytes Absolute 0.8  0.1 - 1.0 K/uL   Eosinophils Relative 4  0 - 5 %   Eosinophils Absolute 0.4  0.0 - 0.7 K/uL   Basophils Relative 1  0 - 1 %   Basophils Absolute 0.1  0.0 - 0.1 K/uL  COMPREHENSIVE METABOLIC PANEL     Status: Abnormal   Collection Time    10/07/13  5:55 AM      Result Value Range   Sodium 142  137 - 147 mEq/L   Potassium 4.3  3.7 - 5.3 mEq/L   Chloride 104  96 - 112 mEq/L   CO2 27  19 - 32 mEq/L   Glucose, Bld 88  70 - 99 mg/dL   BUN 19  6 - 23 mg/dL   Creatinine, Ser 0.83  0.50 - 1.10 mg/dL   Calcium 9.3  8.4 - 10.5 mg/dL   Total Protein 6.8  6.0 - 8.3 g/dL   Albumin 2.9 (*) 3.5 - 5.2 g/dL   AST 31  0 - 37 U/L   ALT 23  0 - 35 U/L   Alkaline Phosphatase 117  39 - 117 U/L   Total Bilirubin 0.3  0.3 - 1.2 mg/dL   GFR calc non Af Amer 65 (*) >90 mL/min   GFR calc Af Amer 75 (*) >90 mL/min   Comment: (NOTE)     The eGFR has been calculated using the CKD EPI equation.     This calculation has not been validated in all clinical situations.     eGFR's persistently <90 mL/min signify possible Chronic Kidney     Disease.     HEENT: normal Cardio: RRR and no murmur Resp: CTA B/L and unlabored GI: BS positive and non distended Extremity:  Pulses positive and No Edema Skin:   Other healed bilat TKR incisions Neuro: Cranial Nerve II-XII normal, Abnormal Sensory on right  Abnormal Motor 4-/5 RUE and 4/5 RLE, 5-/5 on left side, Abnormal FMC Ataxic/ dec FMC and Aphasic Musc/Skel:  Other chronic bilateral wrist joint swelling Gen NAD   Assessment/Plan: 1. Functional deficits secondary to Left MCA infarct which require 3+ hours per day of interdisciplinary therapy in a comprehensive inpatient rehab setting. Physiatrist is providing close team supervision and 24 hour management of active medical problems listed below. Physiatrist and rehab team continue to assess barriers to discharge/monitor patient progress toward functional and medical goals. FIM:    FIM - Upper Body Dressing/Undressing Upper body  dressing/undressing steps patient completed: Thread/unthread right bra strap;Thread/unthread left bra strap;Thread/unthread right sleeve of pullover shirt/dresss;Thread/unthread left sleeve of pullover shirt/dress;Put head through opening of pull over shirt/dress;Pull shirt over trunk Upper body dressing/undressing: 4: Min-Patient completed 75 plus % of tasks FIM - Lower Body Dressing/Undressing Lower body dressing/undressing steps patient completed: Thread/unthread left underwear leg;Pull underwear up/down;Pull pants up/down Lower body dressing/undressing: 2: Max-Patient completed 25-49% of tasks  FIM - Toileting Toileting steps completed by patient: Adjust clothing prior to toileting;Performs perineal hygiene;Adjust clothing after toileting Toileting Assistive Devices: Grab bar or rail for support Toileting: 4: Steadying assist  FIM - Radio producer Devices: Insurance account manager Transfers: 4-To toilet/BSC: Min A (steadying Pt. > 75%);4-From toilet/BSC: Min A (steadying Pt. > 75%)  FIM - Control and instrumentation engineer Devices: Walker;Arm rests Bed/Chair Transfer: 5: Supine > Sit: Supervision (verbal cues/safety issues);4: Supine > Sit: Min A (steadying Pt. > 75%/lift 1 leg);4: Sit > Supine: Min A (steadying pt. > 75%/lift 1 leg);4: Bed > Chair or W/C: Min A (steadying Pt. > 75%);4: Chair or W/C > Bed: Min A (steadying Pt. > 75%)  FIM - Locomotion: Wheelchair Distance: 60 Locomotion: Wheelchair: 2: Travels 50 - 149 ft with supervision, cueing or coaxing FIM - Locomotion: Ambulation Locomotion: Ambulation Assistive Devices: Administrator Ambulation/Gait Assistance: 4: Min assist Locomotion: Ambulation: 1: Travels less than 50 ft with minimal assistance (Pt.>75%)  Comprehension Comprehension Mode: Auditory Comprehension: 5-Follows basic conversation/direction: With no assist  Expression Expression Mode: Verbal Expression: 3-Expresses basic 50 -  74% of the time/requires cueing 25 - 50% of the time. Needs to repeat parts of sentences.  Social Interaction Social Interaction: 4-Interacts appropriately 75 - 89% of the time - Needs redirection for appropriate language or to initiate interaction.  Problem Solving Problem Solving: 4-Solves basic 75 - 89% of the time/requires cueing 10 - 24% of  the time  Memory Memory: 4-Recognizes or recalls 75 - 89% of the time/requires cueing 10 - 24% of the time  Medical Problem List and Plan:  1. Acute left temporoparietal infarct secondary to left carotid stenosis  2. DVT Prophylaxis/Anticoagulation: Subcutaneous Lovenox. Monitor platelet counts and any signs of bleeding  3. Pain Management: Tylenol as needed.Voltaren Gel as needed for knee pain  4. Mood/anxiety: Xanax 0.25 mg each bedtime as needed  5. Neuropsych: This patient is capable of making decisions on her own behalf.  6. Left ICA stenosis. Followup vascular surgery/Dr. Oneida Alar outpatient question stent versus CEA 4-6 weeks  7. CAD/LVD. Followup cardiology services plan [place] and followup appointment second week in February as outpatient  8. Hypertension. Lopressor 50 mg twice a day. Monitor with increased mobility  9. Systolic congestive heart failure. Lasix 20 mg daily. Monitor for any signs of fluid overload  10. Vitrectomy 09/29/2013. Followup ophthalmology services  11. Stage III cocygeal ulcer. Wound care nurse followup. Skin care as directed  12. Hyperlipidemia. Lipitor 13. RLS--increased scheduled mirapex to 0.37m qhs--monitor for response   LOS (Days) 3 A FACE TO FACE EVALUATION WAS PERFORMED  SWARTZ,ZACHARY T 10/09/2013, 7:51 AM

## 2013-10-09 NOTE — Progress Notes (Signed)
Occupational Therapy Session Note  Patient Details  Name: [name] MRN: [id number] Date of Birth: [birth year]  Today's Date: 10/09/2013 Time: 0800-0859 Time Calculation (min): 59 min  Short Term Goals: Week 1:  OT Short Term Goal 1 (Week 1): Focus on LTGs  Skilled Therapeutic Interventions/Progress Updates: ADL-retraining with focus on sit<>stand, static and dynamic standing balance, endurance, NMR of R-UE, and sequencing.  Patient was received in her room, sitting in her w/c awaiting therapist.   After setup assist to prepare clothing and bathing items, patient propelled her w/c to sink with min assist to guide around objects.   Using right hand as dominant hand patient performed bathing seated in w/c at sink, completing upper body bathing with only setup assist and lower body bathing with min assist.  During dressing, patient required max assist to don lower body clothing due to weakness and limited reach but she stood to pull up her brief and pants with min assist for pulling up posterior portion and min assist to steady due to LOB 2 times.   Patient was highly sociable during encounter and required redirection to progress through task efficiently.   Patient required min assist to transfer to recliner at end of session.  Safety belt fastened and call light placed within reach.     Therapy Documentation Precautions:  Precautions Precautions: Fall Restrictions Weight Bearing Restrictions: No  Vital Signs: Therapy Vitals Temp: 97.4 F (36.3 C) Temp src: Oral Pulse Rate: 56 Resp: 18 BP: 149/66 mmHg Patient Position, if appropriate: Lying Oxygen Therapy SpO2: 95 % O2 Device: None (Room air)  Pain: Pain Assessment Pain Assessment: No/denies pain  See FIM for current functional status  Therapy/Group: Individual Therapy  Second session: Time: [id number] Time Calculation (min):  28 min  Pain Assessment: No report of pain  Skilled Therapeutic Interventions:  ADL-retraining with focus on endurance, sit<>stand, and improved safety awareness.   Patient received in her w/c, reporting difficulty with engaging w/c brakes during pressure relief.  OT educated patient on brake lock adjustments as well as proper inflation and containment of her ROHO cushion, as cushion cover was misplaced in her room.   Patient completed 4 sit<>stand and 3 transfers while OT demonstrated w/c brake adjustment and ROHO cushion inflation and containment.  Patient sustained her attention throughout session although requiring extra time to communicate due to word-finding deficit.   See FIM for current functional status  Therapy/Group: Individual Therapy  Malisa Ruggiero 10/09/2013, 9:00 AM

## 2013-10-09 NOTE — Plan of Care (Signed)
Problem: RH SKIN INTEGRITY Goal: RH STG SKIN FREE OF INFECTION/BREAKDOWN Skin free of infection/breakdown with max assist  Outcome: Progressing Routine care to stage II ulcer to sacrum

## 2013-10-10 ENCOUNTER — Inpatient Hospital Stay (HOSPITAL_COMMUNITY): Payer: Medicare Other

## 2013-10-10 ENCOUNTER — Inpatient Hospital Stay (HOSPITAL_COMMUNITY): Payer: Medicare Other | Admitting: Physical Therapy

## 2013-10-10 DIAGNOSIS — I6330 Cerebral infarction due to thrombosis of unspecified cerebral artery: Secondary | ICD-10-CM

## 2013-10-10 DIAGNOSIS — G8110 Spastic hemiplegia affecting unspecified side: Secondary | ICD-10-CM

## 2013-10-10 MED ORDER — HYDROCODONE-ACETAMINOPHEN 5-325 MG PO TABS
1.0000 | ORAL_TABLET | Freq: Four times a day (QID) | ORAL | Status: DC | PRN
  Administered 2013-10-10 – 2013-10-14 (×11): 1 via ORAL
  Filled 2013-10-10 (×13): qty 1

## 2013-10-10 NOTE — Progress Notes (Signed)
Physical Therapy Session Note  Patient Details  Name: Toni PartridgeMayfie A Mercadel MRN: [id number] Date of Birth: [birth year]  Today's Date: 10/10/2013 Time: 323 [id number] and [id number] Time Calculation (min): 59 min and 40 min  Short Term Goals: Week 1:  PT Short Term Goal 1 (Week 1): STG's=LTG's secondary to ELOS  Skilled Therapeutic Interventions/Progress Updates:    Treatment Session 1: Pt received seated in w/c; agreeable to therapy. Session focused on increasing stability/independence with functional mobility, facilitating pt carryover of pressure relief techniques. With min cueing, pt performed sit>stand from w/c using rolling walker. Once in standing with bilat UE support at rolling walker, pt performed partial squats in front of (locked) w/c x10 reps. Pt performed w/c mobility x120' in controlled environment using bilat UE's with supervision. Performed gait x58' in controlled environment with rolling walker, significant thoracic kyphosis; min A, manual facilitation at R axilla and L ribs to facilitate postural normalization with ineffective carryover. Focused on quality of movement for final 2 gait trials x10', x25' in controlled environment with min guard and mod verbal cues for cervicothoracic extension. Gait trials ended secondary to pt fatigue, as indicated by pt return to kyphotic posture, downward gaze during gait. Session ended in pt room, where pt was left seated in w/c with all needs within reach.  Treatment Session 2: Pt received seated in w/c, accompanied by daughter, [name]; agreeable to therapy. Session focused on initiation of hands-on family training. Per daughter report, pt c/o L shoulder pain. RN notified and was present shortly thereafter to address pain. Therapist explained, demonstrated w/c mobility using R hemi technique to facilitate independence with functional mobility while preventing L shoulder pain. Pt gave effective return demonstration of w/c mobility x50' in controlled environment  using R hemi technique with supervision, cueing. Performed stand pivot transfer from w/c<>car with rolling walker and supervision, cueing for safe hand placement. Pt's daughter gave effective return demonstration of supervision and safe/proper cueing car transfer with rolling walker.  Toilet transfer with min A, rolling walker. Pt's daughter, [name], gave effective return demonstration of appropriate supervision/cueing with stand pivot transfer from w/c>bedside chair with rolling walker and supervision. Therapist therefore cleared patient's daughter, [name], to provide supervision with bed<>chair transfers only. RN notified and safety plan modified to reflect changes. Therapist explained that occupational therapist could check pt's daughter off with toilet transfer if OT determines transfer is safe. Daughter verbalized understanding. Therapist departed with pt seated in bedside chair with all needs within reach.   Therapy Documentation Precautions:  Precautions Precautions: Fall Restrictions Weight Bearing Restrictions: No Vital Signs: Therapy Vitals Pulse Rate: 58 BP: 132/68 mmHg Pain: Pain Assessment Pain Assessment: No/denies pain Pain Score: 0-No pain Locomotion : Ambulation Ambulation/Gait Assistance: 4: Min assist Wheelchair Mobility Distance: 120   See FIM for current functional status  Therapy/Group: Individual Therapy  Hobble, Lorenda IshiharaBlair A 10/10/2013, 12:42 PM

## 2013-10-10 NOTE — Plan of Care (Signed)
Problem: RH BLADDER ELIMINATION Goal: RH STG MANAGE BLADDER WITH ASSISTANCE STG Manage Bladder With Assistance. Mod I  Outcome: Progressing No incontinent episode reported     

## 2013-10-10 NOTE — Progress Notes (Signed)
Occupational Therapy Session Note  Patient Details  Name: [name] MRN: [id number] Date of Birth: 1930/12/18  Today's Date: 10/10/2013 Time:  0730-0830 Time calculation (min): 60 min  Short Term Goals: Week 1:  OT Short Term Goal 1 (Week 1): Focus on LTGs  Skilled Therapeutic Interventions/Progress Updates:    Pt seen for ADL retraining with focus on sit<>stand, standing balance, activity tolerance, functional use of RUE, and word finding. Pt received supine in bed requesting to complete toilet task. Ambulated with RW min assist and min cue for safety with RW. Pt managed clothing down with increased time and min assist and required assist to manage up posteriorly. Pt requesting to complete bathing at sink. Pt used RUE dominantly. Pt required min assist for all sit<>stands. Pt reports having kyphotic posture 1+ years. Utilized stool to assist with threading RLE into LB dressing. Encouraged word finding by having pt identify self-care items and items on breakfast tray. Min cueing for word finding techniques. At end of session pt left sitting in w/c with all needs in reach.   Therapy Documentation Precautions:  Precautions Precautions: Fall Restrictions Weight Bearing Restrictions: No General:   Vital Signs: Therapy Vitals Temp: 97.4 F (36.3 C) Temp src: Oral Pulse Rate: 59 Resp: 17 BP: 138/60 mmHg Patient Position, if appropriate: Lying Oxygen Therapy SpO2: 94 % O2 Device: None (Room air) Pain: Pt reporting pain at location of sore on buttocks which she reports "hurts at home too." RN notified.   See FIM for current functional status  Therapy/Group: Individual Therapy  Nino Amano, Vara GuardianKayla N 10/10/2013, 8:25 AM

## 2013-10-10 NOTE — Progress Notes (Signed)
Subjective/Complaints:  No new issues overnite No leg pain  Review of Systems -RLS symptoms perhaps a little better last night with increased mirapex. Objective: Vital Signs: Blood pressure 138/60, pulse 59, temperature 97.4 F (36.3 C), temperature source Oral, resp. rate 17, height 5\' 2"  (1.575 m), weight 69 kg (152 lb 1.9 oz), SpO2 94.00%. No results found. No results found for this or any previous visit (from the past 72 hour(s)).   HEENT: normal Cardio: RRR and no murmur Resp: CTA B/L and unlabored GI: BS positive and non distended Extremity:  Pulses positive and No Edema Skin:   Other healed bilat TKR incisions Neuro: Cranial Nerve II-XII normal, Abnormal Sensory on right  Abnormal Motor 4-/5 RUE and 4/5 RLE, 5-/5 on left side, Abnormal FMC Ataxic/ dec FMC and Aphasic Musc/Skel:  Other chronic bilateral wrist joint swelling Gen NAD   Assessment/Plan: 1. Functional deficits secondary to Left MCA infarct which require 3+ hours per day of interdisciplinary therapy in a comprehensive inpatient rehab setting. Physiatrist is providing close team supervision and 24 hour management of active medical problems listed below. Physiatrist and rehab team continue to assess barriers to discharge/monitor patient progress toward functional and medical goals. FIM: FIM - Bathing Bathing Steps Patient Completed: Chest;Right Arm;Left Arm;Abdomen;Front perineal area;Right upper leg;Left upper leg;Right lower leg (including foot);Left lower leg (including foot) Bathing: 4: Min-Patient completes 8-9 1778f 10 parts or 75+ percent  FIM - Upper Body Dressing/Undressing Upper body dressing/undressing steps patient completed: Thread/unthread right bra strap;Thread/unthread left bra strap;Thread/unthread right sleeve of pullover shirt/dresss;Thread/unthread left sleeve of pullover shirt/dress;Put head through opening of pull over shirt/dress Upper body dressing/undressing: 4: Min-Patient completed 75 plus %  of tasks FIM - Lower Body Dressing/Undressing Lower body dressing/undressing steps patient completed: Thread/unthread left underwear leg;Thread/unthread left pants leg;Pull pants up/down;Don/Doff left shoe Lower body dressing/undressing: 2: Max-Patient completed 25-49% of tasks  FIM - Toileting Toileting steps completed by patient: Adjust clothing prior to toileting;Performs perineal hygiene;Adjust clothing after toileting Toileting Assistive Devices: Grab bar or rail for support Toileting: 4: Steadying assist  FIM - Diplomatic Services operational officerToilet Transfers Toilet Transfers Assistive Devices: Art gallery managerWalker Toilet Transfers: 4-To toilet/BSC: Min A (steadying Pt. > 75%);4-From toilet/BSC: Min A (steadying Pt. > 75%)  FIM - BankerBed/Chair Transfer Bed/Chair Transfer Assistive Devices: Walker;Arm rests Bed/Chair Transfer: 5: Supine > Sit: Supervision (verbal cues/safety issues);4: Supine > Sit: Min A (steadying Pt. > 75%/lift 1 leg);4: Sit > Supine: Min A (steadying pt. > 75%/lift 1 leg);4: Bed > Chair or W/C: Min A (steadying Pt. > 75%);4: Chair or W/C > Bed: Min A (steadying Pt. > 75%)  FIM - Locomotion: Wheelchair Distance: 60 Locomotion: Wheelchair: 2: Travels 50 - 149 ft with supervision, cueing or coaxing FIM - Locomotion: Ambulation Locomotion: Ambulation Assistive Devices: Designer, industrial/productWalker - Rolling Ambulation/Gait Assistance: 4: Min assist Locomotion: Ambulation: 2: Travels 50 - 149 ft with minimal assistance (Pt.>75%)  Comprehension Comprehension Mode: Auditory Comprehension: 5-Follows basic conversation/direction: With no assist  Expression Expression Mode: Verbal Expression: 3-Expresses basic 50 - 74% of the time/requires cueing 25 - 50% of the time. Needs to repeat parts of sentences.  Social Interaction Social Interaction: 4-Interacts appropriately 75 - 89% of the time - Needs redirection for appropriate language or to initiate interaction.  Problem Solving Problem Solving: 4-Solves basic 75 - 89% of the  time/requires cueing 10 - 24% of the time  Memory Memory: 4-Recognizes or recalls 75 - 89% of the time/requires cueing 10 - 24% of the time  Medical Problem List and  Plan:  1. Acute left temporoparietal infarct secondary to left carotid stenosis  2. DVT Prophylaxis/Anticoagulation: Subcutaneous Lovenox. Monitor platelet counts and any signs of bleeding  3. Pain Management: Tylenol as needed.Voltaren Gel as needed for knee pain  4. Mood/anxiety: Xanax 0.25 mg each bedtime as needed  5. Neuropsych: This patient is capable of making decisions on her own behalf.  6. Left ICA stenosis. Followup vascular surgery/Dr. Darrick Penna outpatient question stent versus CEA 4-6 weeks  7. CAD/LVD. Followup cardiology services plan Lexiscan and followup appointment second week in February as outpatient  8. Hypertension. Lopressor 50 mg twice a day. Monitor with increased mobility  9. Systolic congestive heart failure. Lasix 20 mg daily. Monitor for any signs of fluid overload  10. Vitrectomy 09/29/2013. Followup ophthalmology services  11. Stage III cocygeal ulcer. Wound care nurse followup. Skin care as directed  12. Hyperlipidemia. Lipitor 13. RLS--increased scheduled mirapex to 0.5mg  qhs--better on this dose   LOS (Days) 4 A FACE TO FACE EVALUATION WAS PERFORMED  Mikale Silversmith E 10/10/2013, 8:37 AM

## 2013-10-10 NOTE — Plan of Care (Signed)
Problem: RH BOWEL ELIMINATION Goal: RH STG MANAGE BOWEL WITH ASSISTANCE STG Manage Bowel with Assistance. Mod I Outcome: Progressing No incontinent episode reported     

## 2013-10-10 NOTE — Progress Notes (Signed)
Speech Language Pathology Daily Session Note  Patient Details  Name: [name] MRN: [id number] Date of Pieter PartridgeBirth: 12/12/1930  Today's Date: 10/10/2013 Time: 1330-1420 Time Calculation (min): 50 min  Short Term Goals: Week 1: SLP Short Term Goal 1 (Week 1): Pt will communicate wants/needs with supervision level cues SLP Short Term Goal 2 (Week 1): Pt will utilize word-finding strategies in conversation with Min cues  Skilled Therapeutic Interventions: Skilled treatment focused on linguistic goals. SLP facilitated session with review of word-finding strategies. Pt recalled 2 out of 4 strategies and used external aid for remaining two with Mod I. Pt participated in structured language task comparing and contrasting common objects in pictures. Pt demonstrated 100% accuracy with confrontational naming with extra time and use of visualization and internal description strategies. Pt required Mod faded to Min cues for use of word-finding strategies while comparing and contrasting at the sentence level. Pt's daughter, [name], arrived toward the end of session and was educated regarding strategies and progress in therapy. [name] voiced her concerns regarding pain management and wanting to be able to transfer her mother. RN and treating team made aware of family concerns. Continue plan of care.   FIM:  Comprehension Comprehension Mode: Auditory Comprehension: 5-Understands basic 90% of the time/requires cueing < 10% of the time Expression Expression Mode: Verbal Expression: 3-Expresses basic 50 - 74% of the time/requires cueing 25 - 50% of the time. Needs to repeat parts of sentences. Social Interaction Social Interaction: 5-Interacts appropriately 90% of the time - Needs monitoring or encouragement for participation or interaction. Problem Solving Problem Solving: 5-Solves basic 90% of the time/requires cueing < 10% of the time Memory Memory: 4-Recognizes or recalls 75 - 89% of the time/requires  cueing 10 - 24% of the time  Pain Pain Assessment Pain Assessment: FLACC Pain Score: 3  Pain Type: Acute pain Pain Location: Shoulder Pain Orientation: Left Pain Descriptors / Indicators: Aching Pain Onset: With Activity Pain Intervention(s): RN made aware;Rest Multiple Pain Sites: No  Therapy/Group: Individual Therapy   Maxcine HamLaura Paiewonsky, M.A. CCC-SLP [phone]   Maxcine Hamaiewonsky, Jules Baty 10/10/2013, 5:10 PM

## 2013-10-10 NOTE — Plan of Care (Signed)
Problem: RH PAIN MANAGEMENT Goal: RH STG PAIN MANAGED AT OR BELOW PT'S PAIN GOAL Pain managed at or below 2  Outcome: Not Progressing Rating pain as 4 on L shoulder at this time

## 2013-10-10 NOTE — Plan of Care (Signed)
Problem: RH SKIN INTEGRITY Goal: RH STG ABLE TO PERFORM INCISION/WOUND CARE W/ASSISTANCE STG Able To Perform Incision/Wound Care With Assistance. Total A Outcome: Progressing Requires staff to do daily dressing change to Stage 3 on sacrum

## 2013-10-10 NOTE — Plan of Care (Signed)
Problem: RH SKIN INTEGRITY Goal: RH STG SKIN FREE OF INFECTION/BREAKDOWN Skin free of infection/breakdown with assist. Total A Requires Total A with dressing change and reminders to turn q 2hrs

## 2013-10-11 ENCOUNTER — Inpatient Hospital Stay (HOSPITAL_COMMUNITY): Payer: Medicare Other

## 2013-10-11 ENCOUNTER — Inpatient Hospital Stay (HOSPITAL_COMMUNITY): Payer: Medicare Other | Admitting: Physical Therapy

## 2013-10-11 ENCOUNTER — Inpatient Hospital Stay (HOSPITAL_COMMUNITY): Payer: Medicare Other | Admitting: *Deleted

## 2013-10-11 NOTE — Progress Notes (Signed)
Social Work Assessment and Plan  Patient Details  Name: [name] MRN: [id number] Date of Birth: 08-May-1931  Today's Date: 10/11/2013  Problem List:  Patient Active Problem List   Diagnosis Date Noted  . UTI (urinary tract infection) 10/01/2013  . Occlusion and stenosis of carotid artery with cerebral infarction 10/01/2013  . CVA (cerebral infarction) 09/30/2013  . Slurred speech 09/30/2013  . Anemia of other chronic disease 03/11/2013  . Osteoporosis 12/25/2012  . Spinal stenosis of lumbar region 12/25/2012  . Chronic back pain 12/25/2012  . Hip pain- LEFT 12/25/2012  . AKI (acute kidney injury) 12/25/2012  . Fall 12/25/2012  . Anemia 12/25/2012  . Hyponatremia 12/25/2012  . Cervical spine fracture 12/25/2012  . RLS (restless legs syndrome) 12/25/2012  . Varicose veins 12/25/2012  . CHF (congestive heart failure)   . Coronary artery disease   . Hypertension   . Arthritis    Past Medical History:  Past Medical History  Diagnosis Date  . CHF (congestive heart failure)   . Coronary artery disease   . Hypertension   . Arthritis   . Edema of extremities   . Anxiety   . HLD (hyperlipidemia)    Past Surgical History:  Past Surgical History  Procedure Laterality Date  . Total knee arthroplasty Bilateral     knee replacements  . Lumbar disc surgery    . Cholecystectomy    . Posterior cervical fusion/foraminotomy N/A 12/28/2012    Procedure: POSTERIOR CERVICAL FUSION/FORAMINOTOMY LEVEL 3;  Surgeon: Kristeen Miss, MD;  Location: [facility];  Service: Neurosurgery;  Laterality: N/A;  Posterior Cervical Three-Six Laminectomy and Fusion  . Angioplasty    . Tonsillectomy     Social History:  reports that she has never smoked. She has never used smokeless tobacco. She reports that she drinks alcohol. She reports that she does not use illicit drugs.  Family / Support Systems Marital Status: Widow/Widower How Long?: about 3 years Patient Roles: Parent Children: [place]- [phone] Jerilynn Mages) Rhonda - dtr   Rusty Pyeatt - son - [phone] [place] (M) Anticipated Caregiver: Daughters and caregivers, Bethena Roys and Teen Ability/Limitations of Caregiver: Caregiver Judy 8:30 pm to 7:30 am, Teen 7:30 am to 11:30 am, Dtr Suanne Marker after 11:30, Bethena Roys in and out and another daughter 2-3 pm Caregiver Availability: 24/7 Family Dynamics: close, supportive family  Social History Preferred language: English Religion: Baptist Cultural Background: Retired from Ambulance person.  Attends [employer] with her family when she is physically able. Read: Yes Write: Yes Employment Status: Retired Freight forwarder Issues: none reported Guardian/Conservator: N/A   Abuse/Neglect Physical Abuse: Denies Verbal Abuse: Denies Sexual Abuse: Denies Exploitation of patient/patient's resources: Denies Self-Neglect: Denies  Emotional Status Pt's affect, behavior and adjustment status: Pt is motivated to get stronger.  She is positive and optimistic, yet able to express feelings of frustration with recent medical difficulties in the past year. Recent Psychosocial Issues: Recent 100 day admission at [facility] with d/c in July 2014. Pyschiatric History: Reports a hx of anxiety with panic attacks. Has learned when panic attacks are coming and how to cope with them.  Also, has medication she can take. Substance Abuse History: None reported  Patient / Family Perceptions, Expectations & Goals Pt/Family understanding of illness & functional limitations: Pt and dtr expressed a good understanding of pt's condition and feel their questions have been answered by the medical team. Premorbid pt/family roles/activities: Pt likes to read, work puzzles, watch TV,  and spend time with her family and 2 cats. Anticipated changes in roles/activities/participation: Pt has help in her home 24/7 with meal preparation, light housekeeping, dressing/bathing, supervision, etc. Pt/family  expectations/goals: Pt "would like to get home to my own home and to my cats."  [employer]: None Premorbid Home Care/DME Agencies: Other (Comment) (Has used [place] and [facility] in the past) Transportation available at discharge: family and caregivers Resource referrals recommended: Support group (specify)  Discharge Planning Living Arrangements: Children;Other (Comment) (paid caregivers) Support Systems: Children;Other relatives;Home care staff Type of Residence: Private residence Insurance Resources: Medicare [facility] Medicare) Financial Resources: Social Security Financial Screen Referred: No Living Expenses: Own Money Management: Patient;Family Does the patient have any problems obtaining your medications?: No Home Management: Caregivers and family assist pt with this. Patient/Family Preliminary Plans: Pt plans to go home to her home with the support of her children and paid caregivers. Social Work Anticipated Follow Up Needs: HH/OP;Support Group Expected length of stay: 6-8 days  Clinical Impression CSW met with pt to introduce self and CSW role to pt and to complete assessment.  Pt's dtr was not present at the time of CSW's visit, so CSW followed up with pt's dtr via telephone.  Pt has good family and paid caregiver support at home.  She is going to have 24/7 supervision when she goes home from the rehab unit.  Pt has received home health services from [facility] in the past and is willing to have them in her home again.  Pt received her equipment from [facility].  Pt has a stage III wound on her coccyx and therapists are recommending a specialty pad for her w/c.  CSW is working on this to see if insurance will cover this item.  [facility] does not carry anything other than a gel cushion, which pt already has, so CSW is working with [facility] at this time.  Pt will probably be able to d/c home by the end of the week and she is looking forward to this and feels  confident to return home.  CSW will know more after Wednesday's team conference and will share this information with pt and dtr at that time.  Pt and dtr do not have any current concerns/questions.  CSW will continue to follow pt and assist as needs arise.  Synethia Endicott, Silvestre Mesi 10/11/2013, 11:34 AM

## 2013-10-11 NOTE — Progress Notes (Signed)
Speech Language Pathology Daily Session Note  Patient Details  Name: [name] MRN: [id number] Date of Birth: 02/11/1931  Today's Date: 10/11/2013 Time: 1315-1400 Time Calculation (min): 45 min  Short Term Goals: Week 1: SLP Short Term Goal 1 (Week 1): Pt will communicate wants/needs with supervision level cues SLP Short Term Goal 2 (Week 1): Pt will utilize word-finding strategies in conversation with Min cues  Skilled Therapeutic Interventions: Skilled treatment focused on linguistic goals. Upon arrival, pt verbally communicated her need to go to the bathroom and requested assistance with Mod I. SLP facilitated session with structured linguistic tasks. Pt required Min cues to utilize word-finding strategies during conversation and categorical naming tasks. When SLP increased structure and difficulty of task (alphabetical naming), pt required Mod cues for use of strategies. Continue plan of care.   FIM:  Comprehension Comprehension Mode: Auditory Comprehension: 5-Follows basic conversation/direction: With no assist Expression Expression Mode: Verbal Expression: 3-Expresses basic 50 - 74% of the time/requires cueing 25 - 50% of the time. Needs to repeat parts of sentences. Social Interaction Social Interaction: 4-Interacts appropriately 75 - 89% of the time - Needs redirection for appropriate language or to initiate interaction. Problem Solving Problem Solving: 4-Solves basic 75 - 89% of the time/requires cueing 10 - 24% of the time Memory Memory: 4-Recognizes or recalls 75 - 89% of the time/requires cueing 10 - 24% of the time  Pain Pain Assessment Pain Assessment: No/denies pain  Therapy/Group: Individual Therapy   Maxcine HamLaura Paiewonsky, M.A. CCC-SLP [phone]   Maxcine Hamaiewonsky, Regis Hinton 10/11/2013, 4:52 PM

## 2013-10-11 NOTE — Progress Notes (Signed)
Occupational Therapy Session Note  Patient Details  Name: Toni PartridgeMayfie A Stroh MRN: [id number] Date of Birth: [birth year]  Today's Date: 10/11/2013 Time: 0702-0803 Time Calculation (min): 61 min  Short Term Goals: Week 1:  OT Short Term Goal 1 (Week 1): Focus on LTGs  Skilled Therapeutic Interventions/Progress Updates:    Pt seen for ADL retraining with focus on activity tolerance, sit<>stand, and functional transfers. Pt received supine in bed and agreeable to shower this AM. Pt required steadying assist to ambulate to toilet with RW and increased time. Completed toilet task with steadying assist for standing balance. Pt completed dressing from w/c level with cues to use stool for threading BLEs into pants, increasing independence. Pt required rest breaks between sit<>stand x2 for clothing management around waist. Pt initially min assist sit<>stand and progressed to supervision level. Pt required mod cues for word finding throughout session.  At end of session pt left sitting in w/c with RN present and breakfast.   Therapy Documentation Precautions:  Precautions Precautions: Fall Restrictions Weight Bearing Restrictions: No General:   Vital Signs: Therapy Vitals BP: 158/64 mmHg Pain: No report of pain during therapy session.  Other Treatments:    See FIM for current functional status  Therapy/Group: Individual Therapy  Daneil Danerkinson, Terrian Sentell N 10/11/2013, 10:21 AM

## 2013-10-11 NOTE — Progress Notes (Signed)
Physical Therapy Session Note  Patient Details  Name: [name] MRN: [id number] Date of Birth: 10/17/1930  Today's Date: 10/11/2013 Time: [id number] and [id number] Time Calculation (min): 58 min and 38 min   Short Term Goals: Week 1:  PT Short Term Goal 1 (Week 1): STG's=LTG's secondary to ELOS  Skilled Therapeutic Interventions/Progress Updates:    Treatment Session 1: Pt received seated in w/c, asleep but easily awakened; agreeable to session. Pt reports L shoulder pain. RN notified and present shortly thereafter to administer medication. Pt demonstrated pressure relief technique (sit>stand from w/c, mini squats x10 reps with bilat UE support at rolling walker) with mod I.     Gait x56' in controlled environment with rolling walker requiring min guard, mod verbal cues for postural awareness. Gait trial limited by onset of L shoulder pain secondary to use of rolling walker, per pt report. Attempted gait with LBQC x10', x8', x22' in controlled environment with mod A; decreased LLE coordination noted. Pt does report decreased L shoulder pain when using LBQC with RUE. Therapist departed with pt seated in w/c with all needs within reach. Pt in no apparent distress and reports L shoulder pain is "a little better," per pt.   Treatment Session 2: Pt received seated in w/c, asleep; pt with bilat w/c leg rests off, bilat feet on floor, anterior pelvic tilt, and significant cervicothoracic flexion. Pt c/o pain in coccyx and L shoulder. Therapist educated pt on effect of posture on pain. Therapist recommended that pt leave w/c foot rests on when sitting in w/c to avoid postural alignment described above. Pt verbally agreed.  Session focused on static/dynamic standing balance. Performed multiple sit>stand transfers from w/c with rolling walker. With word search on tapes on wall at eye-level, pt attempted to circle words in standing with RUE support at rolling walker. Pt able to tolerate 5 total standing  trials 2x1 minute, 2x2 minutes, and 1x1.5 minutes prior to pt request for seated rest break secondary to pain in L shoulder and coccyx. RN notified of pain and pt report of "ripping" sensation on coccyx. RN present shortly thereafter to address pain. RN also suggested changing type of tape utilized with wound dressing to avoid "ripping" sensation. Therapist applied Voltaren gel to L shoulder to address pain.  Pt performed w/c propulsion x90' in controlled environment using RUE and bilat LE's (LUE not utilized secondary to pain) with supervision, coaxing. Session ended in pt room, where pt was left seated in w/c with bilat LE's on w/c leg rests and all needs within reach.  Therapy Documentation Precautions:  Precautions Precautions: Fall Restrictions Weight Bearing Restrictions: No Pain: Pain Assessment Pain Assessment: FLACC Pain Score: 7  Pain Location: Shoulder Pain Orientation: Left Pain Descriptors / Indicators: Aching Pain Onset: On-going Pain Intervention(s): RN made aware;Rest;Other (Comment) (Applied Voltaren cream) Multiple Pain Sites: Yes 2nd Pain Site Pain Score: 4 Pain Type: Acute pain Pain Location: Coccyx Pain Descriptors / Indicators:  ("Ripping," per pt) Pain Onset: Progressive Pain Intervention(s): RN made aware;Repositioned;Other (Comment);Ambulation/increased activity (Pressure relief) Locomotion : Ambulation Ambulation/Gait Assistance: 4: Min guard Wheelchair Mobility Distance: 90   See FIM for current functional status  Therapy/Group: Individual Therapy  Jovie Swanner, Lorenda IshiharaBlair A 10/11/2013, 12:22 PM

## 2013-10-11 NOTE — Progress Notes (Signed)
Subjective/Complaints:  No new issues overnite No leg pain  Review of Systems -shoulder and leg pain improved on hydrocodone Objective: Vital Signs: Blood pressure 151/46, pulse 52, temperature 97.5 F (36.4 C), temperature source Oral, resp. rate 18, height 5\' 2"  (1.575 m), weight 69 kg (152 lb 1.9 oz), SpO2 100.00%. No results found. No results found for this or any previous visit (from the past 72 hour(s)).   HEENT: normal Cardio: RRR and no murmur Resp: CTA B/L and unlabored GI: BS positive and non distended Extremity:  Pulses positive and No Edema Skin:   Other healed bilat TKR incisions Neuro: Cranial Nerve II-XII normal, Abnormal Sensory on right  Abnormal Motor 4-/5 RUE and 4/5 RLE, 5-/5 on left side, Abnormal FMC Ataxic/ dec FMC and Aphasic Musc/Skel:  Other chronic bilateral wrist joint swelling Gen NAD   Assessment/Plan: 1. Functional deficits secondary to Left MCA infarct which require 3+ hours per day of interdisciplinary therapy in a comprehensive inpatient rehab setting. Physiatrist is providing close team supervision and 24 hour management of active medical problems listed below. Physiatrist and rehab team continue to assess barriers to discharge/monitor patient progress toward functional and medical goals. FIM: FIM - Bathing Bathing Steps Patient Completed: Chest;Right Arm;Left Arm;Abdomen;Front perineal area;Right upper leg;Left upper leg;Right lower leg (including foot);Left lower leg (including foot) Bathing: 4: Min-Patient completes 8-9 37f 10 parts or 75+ percent  FIM - Upper Body Dressing/Undressing Upper body dressing/undressing steps patient completed: Thread/unthread right bra strap;Thread/unthread left bra strap;Thread/unthread right sleeve of pullover shirt/dresss;Thread/unthread left sleeve of pullover shirt/dress;Put head through opening of pull over shirt/dress;Pull shirt over trunk Upper body dressing/undressing: 4: Min-Patient completed 75 plus % of  tasks FIM - Lower Body Dressing/Undressing Lower body dressing/undressing steps patient completed: Thread/unthread left underwear leg;Thread/unthread left pants leg;Pull pants up/down;Don/Doff left shoe;Thread/unthread right underwear leg;Thread/unthread right pants leg;Don/Doff left sock Lower body dressing/undressing: 3: Mod-Patient completed 50-74% of tasks  FIM - Toileting Toileting steps completed by patient: Adjust clothing prior to toileting;Performs perineal hygiene;Adjust clothing after toileting Toileting Assistive Devices: Grab bar or rail for support Toileting: 4: Steadying assist  FIM - Diplomatic Services operational officer Devices: Art gallery manager Transfers: 4-To toilet/BSC: Min A (steadying Pt. > 75%);4-From toilet/BSC: Min A (steadying Pt. > 75%)  FIM - Banker Devices: Walker;Arm rests Bed/Chair Transfer: 5: Supine > Sit: Supervision (verbal cues/safety issues);4: Supine > Sit: Min A (steadying Pt. > 75%/lift 1 leg);4: Sit > Supine: Min A (steadying pt. > 75%/lift 1 leg);4: Bed > Chair or W/C: Min A (steadying Pt. > 75%);4: Chair or W/C > Bed: Min A (steadying Pt. > 75%)  FIM - Locomotion: Wheelchair Distance: 50 Locomotion: Wheelchair: 2: Travels 50 - 149 ft with supervision, cueing or coaxing FIM - Locomotion: Ambulation Locomotion: Ambulation Assistive Devices: Designer, industrial/product Ambulation/Gait Assistance: 4: Min assist Locomotion: Ambulation: 0: Activity did not occur  Comprehension Comprehension Mode: Auditory Comprehension: 5-Follows basic conversation/direction: With no assist  Expression Expression Mode: Verbal Expression: 3-Expresses basic 50 - 74% of the time/requires cueing 25 - 50% of the time. Needs to repeat parts of sentences.  Social Interaction Social Interaction: 4-Interacts appropriately 75 - 89% of the time - Needs redirection for appropriate language or to initiate interaction.  Problem  Solving Problem Solving: 4-Solves basic 75 - 89% of the time/requires cueing 10 - 24% of the time  Memory Memory: 4-Recognizes or recalls 75 - 89% of the time/requires cueing 10 - 24% of the time  Medical Problem  List and Plan:  1. Acute left temporoparietal infarct secondary to left carotid stenosis  2. DVT Prophylaxis/Anticoagulation: Subcutaneous Lovenox. Monitor platelet counts and any signs of bleeding  3. Pain Management: Tylenol as needed.Voltaren Gel as needed for knee pain  4. Mood/anxiety: Xanax 0.25 mg each bedtime as needed  5. Neuropsych: This patient is capable of making decisions on her own behalf.  6. Left ICA stenosis. Followup vascular surgery/Dr. Darrick PennaFields outpatient question stent versus CEA 4-6 weeks  7. CAD/LVD. Followup cardiology services plan Lexiscan and followup appointment second week in February as outpatient  8. Hypertension. Lopressor 50 mg twice a day. Monitor with increased mobility  9. Systolic congestive heart failure. Lasix 20 mg daily. Monitor for any signs of fluid overload  10. Vitrectomy 09/29/2013. Followup ophthalmology services  11. Stage III cocygeal ulcer. Wound care nurse followup. Skin care as directed  12. Hyperlipidemia. Lipitor 13. RLS--increased scheduled mirapex to 0.5mg  qhs--better on this dose   LOS (Days) 5 A FACE TO FACE EVALUATION WAS PERFORMED  KIRSTEINS,ANDREW E 10/11/2013, 7:54 AM

## 2013-10-11 NOTE — Progress Notes (Signed)
[facility] Individual Statement of Services  Patient Name:  [name]  Date:  10/11/2013  Welcome [facility] the [facility].  Our goal is to provide you with an individualized program based on your diagnosis and situation, designed to meet your specific needs.  With this comprehensive rehabilitation program, you will be expected to participate in at least 3 hours of rehabilitation therapies Monday-Friday, with modified therapy programming on the weekends.  Your rehabilitation program will include the following services:  Physical Therapy (PT), Occupational Therapy (OT), Speech Therapy (ST), 24 hour per day rehabilitation nursing, Therapeutic Recreation (TR), Neuropsychology, Case Management (Social Worker), Rehabilitation Medicine, Nutrition Services and Pharmacy Services  Weekly team conferences will be held on Wednesdays to discuss your progress.  Your Social Worker will talk with you frequently to get your input and to update you on team discussions.  Team conferences with you and your family in attendance may also be held.  Expected length of stay: 6 to 8 days  Overall anticipated outcome: Supervision  Depending on your progress and recovery, your program may change. Your Social Worker will coordinate services and will keep you informed of any changes. Your Social Worker's name and contact numbers are listed  below.  The following services may also be recommended but are not provided by the [facility]:   Driving Evaluations  Home Health Rehabiltiation Services  Outpatient Rehabilitation Services   Arrangements will be made to provide these services after discharge if needed.  Arrangements include referral to agencies that provide these services.  Your insurance has been verified to be:  [employer] Your primary doctor is:  Dr. Maurice SmallElaine Griffin  Pertinent information will be shared with your doctor and your insurance  company.  Social Worker:  Staci AcostaJenny Adline Kirshenbaum, LCSW  [phone] or ([phone]) [phone]  Information discussed with and copy given to patient by: [name], [name], 10/11/2013, 9:31 AM

## 2013-10-12 ENCOUNTER — Inpatient Hospital Stay (HOSPITAL_COMMUNITY): Payer: Medicare Other

## 2013-10-12 ENCOUNTER — Inpatient Hospital Stay (HOSPITAL_COMMUNITY): Payer: Medicare Other | Admitting: Physical Therapy

## 2013-10-12 ENCOUNTER — Ambulatory Visit (HOSPITAL_COMMUNITY): Payer: Medicare Other | Admitting: *Deleted

## 2013-10-12 ENCOUNTER — Encounter (HOSPITAL_COMMUNITY): Payer: Medicare Other

## 2013-10-12 MED ORDER — PREDNISOLONE ACETATE 1 % OP SUSP
1.0000 [drp] | Freq: Three times a day (TID) | OPHTHALMIC | Status: DC
  Administered 2013-10-12 – 2013-10-14 (×6): 1 [drp] via OPHTHALMIC
  Filled 2013-10-12: qty 1

## 2013-10-12 NOTE — Progress Notes (Signed)
Physical Therapy Session Note  Patient Details  Name: [name] MRN: [id number] Date of Birth: [birth year]  Today's Date: 10/12/2013 Time: [id number] and [id number] Time Calculation (min): 45 min and 39 min  Short Term Goals: Week 1:  PT Short Term Goal 1 (Week 1): STG's=LTG's secondary to ELOS  Skilled Therapeutic Interventions/Progress Updates:    Treatment Session 1: Pt received seated in w/c; agreeable to session. Gait x64', x66' in controlled and home environments with rolling walker and supervision. Pt independently performed supine<>sit transfer in apartment bed. Pt performed furniture transfer with rolling walker and supervision. Multiple stand pivot transfers from apartment bed<>couch<>w/c with rolling walker and supervision. Self-propulsion of w/c using RUE and bilat LE's (to avoid L shoulder pain) x125' in controlled and home environments with supervision. Session ended in pt room, where pt was left seated in w/c with OT present for B&D.  Treatment Session 2: Pt received seated in w/c with daughter, [name], present. Pt agreeable to therapy. Session focused on completion of hands-on family education/training. Transported pt to ortho gym in w/c, where pt performed car transfer with rolling walker and supervision of daughter. Gait x70' in controlled environment with  Daughter provided safe/appropriate supervision and verbal cues durin  Daughter requests to practice negotiation of 2 stairs using rolling walker to enable pt to visit daughter's home. Attempted negotiation of 2 (8") stairs without rails using rolling walker (ascending forward-facing; descending backwards) requiring min A to stabilize rolling walker and to provide min guard for pt stability/balance; PT provided min guard for safety, max verbal cueing for safe technique, and mod encouragement secondary to pt fear of falling. Therapist verbally recommended that daughter practice stair negotiation technique using rolling walker  with home health PT and pt prior to attempting stair negotiation to enter daughter's home. Daughter verbalized understanding and was in full agreement. Session ended in pt room, where pt was left seated in w/c with daughter present and all needs within reach.  Long term goals addressing the following were downgraded: gait distance in controlled and community environments secondary to slow pt progress; assistance required with stair negotiation secondary to pt fear of falling during stair negotiation.  Therapy Documentation Precautions:  Precautions Precautions: Fall Restrictions Weight Bearing Restrictions: No Pain: Pain Assessment Pain Assessment: 0-10 Pain Score: 5  Pain Type: Chronic pain Pain Location: Coccyx Pain Orientation: Left Pain Descriptors / Indicators: Aching Pain Frequency: Constant Pain Onset: On-going Patients Stated Pain Goal: 3 Pain Intervention(s): Medication (See eMAR) Multiple Pain Sites: No Locomotion : Ambulation Ambulation/Gait Assistance: 5: Supervision   See FIM for current functional status  Therapy/Group: Individual Therapy  Reid Regas, Lorenda IshiharaBlair A 10/12/2013, 3:53 PM

## 2013-10-12 NOTE — Progress Notes (Signed)
Occupational Therapy Note  Patient Details  Name: Pieter PartridgeMayfie A Rosander MRN: [id number] Date of Birth: 1931/03/21 Today's Date: 10/12/2013  Time In:  0945  Time Out:  1036  Individual session no c/o pain.  ADL retraining at sink level with emphasis on increasing activity tolerance, sit to stand, static and dynamic standing balance, weight shifting in standing.  Patient states she has an aide at home and based on her description of what aide does and what she does, it would appear that patient is at baseline (i.e. Required set up,assist to wash buttocks, and supervision for standing). Patient states some one is with her 24/7 and she never stands or walks without someone right next to her.  Patient demonstrating good activity tolerance and very motivated to be as independent as possible.  Patient is concerned that if she stays in rehab too long she may lose her help at home. Will share with team and social worker.    Norton Pastelulaski, Karen Halliday 10/12/2013, 10:40 AM

## 2013-10-12 NOTE — Progress Notes (Signed)
Physical Therapy Session Note  Patient Details  Name: Toni PartridgeMayfie A Diaz MRN: [id number] Date of Birth: 1931/05/22  Today's Date: 10/12/2013 Time: 1300-1330 Time Calculation (min): 30 min  Skilled Therapeutic Interventions/Progress Updates:  1:1. Pt received sitting in w/c, ready for therapy. Focus this session on w/c propulsion, t/f sit<>stand as well as word finding/naming and NMR during horticultural therapy activity. Pt able to propel w/c x150' w/ R UE/LE and min A. Pt req overall min A for t/f sit<>stand w/ RW as well as during dynamic standing balance w/ single UE support on RW while arranging flower in vase w/c close (S) to min A and encouragement to use L UE. Pt required min-mod phonemic cues and/or sentence completion cues for word finding during activity. Pt sitting in w/c at end of session w/ all needs in reach, quick release belt in place.   Therapy Documentation Precautions:  Precautions Precautions: Fall Restrictions Weight Bearing Restrictions: No  See FIM for current functional status  Therapy/Group: Individual Therapy  Denzil HughesKing, Ailene Royal S 10/12/2013, 4:12 PM

## 2013-10-12 NOTE — Progress Notes (Signed)
Received call from Dr. Zella BallSander's office to change regimen of eye drop--d/c ciprodex. She has/had gas bubble in her eye and they want to be notified if patient develops eye/eyebrow pain, sudden decrease in visual acuity or sudden onset of N/V.  Will place sticky note to make staff aware.

## 2013-10-12 NOTE — Progress Notes (Signed)
Speech Language Pathology Daily Session Note  Patient Details  Name: [name] MRN: [id number] Date of Birth: 04/06/1931  Today's Date: 10/12/2013 Time: 1400-1446 Time Calculation (min): 46 min  Short Term Goals: Week 1: SLP Short Term Goal 1 (Week 1): Pt will communicate wants/needs with supervision level cues SLP Short Term Goal 2 (Week 1): Pt will utilize word-finding strategies in conversation with Min cues  Skilled Therapeutic Interventions: Skilled treatment focused on linguistic goals and pt/family education. SLP facilitated session with review of word-finding strategies. Pt utilized strategies in structured linguistic task with supervision level verbal cues. Pt communicated at the conversational level with supervision level assistance to express abstract thoughts and ideas. She expressed her basic wants/needs with Mod I. Pt's daughter arrived at the end of treatment, and both pt/daughter were educated regarding current level of function as well as recommendations upon discharge. Continue plan of care.   FIM:  Comprehension Comprehension Mode: Auditory Comprehension: 7-Follows complex conversation/direction: With no assist Expression Expression Mode: Verbal Expression: 5-Expresses basic 90% of the time/requires cueing < 10% of the time. Social Interaction Social Interaction: 6-Interacts appropriately with others with medication or extra time (anti-anxiety, antidepressant). Problem Solving Problem Solving: 5-Solves basic problems: With no assist Memory Memory: 7-Complete Independence: No helper  Pain Pain Assessment Pain Assessment: 0-10 Pain Score: 5  Pain Type: Chronic pain Pain Location: Coccyx Pain Orientation: Left Pain Descriptors / Indicators: Aching Pain Frequency: Constant Pain Onset: On-going Patients Stated Pain Goal: 3 Pain Intervention(s): Medication (See eMAR) Multiple Pain Sites: No  Therapy/Group: Individual Therapy   [name], M.A.  CCC-SLP [phone] Diaz   [name], [name] 10/12/2013, 4:13 PM

## 2013-10-12 NOTE — Progress Notes (Addendum)
Subjective/Complaints:  No new issues overnite No leg pain  Review of Systems -shoulder and leg pain improved on hydrocodone Objective: Vital Signs: Blood pressure 123/65, pulse 61, temperature 97.2 F (36.2 C), temperature source Oral, resp. rate 18, height 5\' 2"  (1.575 m), weight 69 kg (152 lb 1.9 oz), SpO2 96.00%. No results found. No results found for this or any previous visit (from the past 72 hour(s)).   HEENT: normal Cardio: RRR and no murmur Resp: CTA B/L and unlabored GI: BS positive and non distended Extremity:  Pulses positive and No Edema Skin:   Other healed bilat TKR incisions Neuro: Cranial Nerve II-XII normal, Abnormal Sensory on right  Abnormal Motor 4-/5 RUE and 4/5 RLE, 5-/5 on left side, Abnormal FMC Ataxic/ dec FMC and Aphasic Musc/Skel:  Other chronic bilateral wrist joint swelling Gen NAD   Assessment/Plan: 1. Functional deficits secondary to Left MCA infarct which require 3+ hours per day of interdisciplinary therapy in a comprehensive inpatient rehab setting. Physiatrist is providing close team supervision and 24 hour management of active medical problems listed below. Physiatrist and rehab team continue to assess barriers to discharge/monitor patient progress toward functional and medical goals. FIM: FIM - Bathing Bathing Steps Patient Completed: Chest;Right Arm;Left Arm;Abdomen;Front perineal area;Right upper leg;Left upper leg;Right lower leg (including foot);Left lower leg (including foot) Bathing: 4: Min-Patient completes 8-9 5614f 10 parts or 75+ percent  FIM - Upper Body Dressing/Undressing Upper body dressing/undressing steps patient completed: Thread/unthread right bra strap;Thread/unthread left bra strap;Thread/unthread right sleeve of pullover shirt/dresss;Thread/unthread left sleeve of pullover shirt/dress;Put head through opening of pull over shirt/dress;Pull shirt over trunk Upper body dressing/undressing: 4: Min-Patient completed 75 plus % of  tasks FIM - Lower Body Dressing/Undressing Lower body dressing/undressing steps patient completed: Thread/unthread left underwear leg;Thread/unthread left pants leg;Pull pants up/down;Don/Doff left shoe;Thread/unthread right underwear leg;Thread/unthread right pants leg;Pull underwear up/down;Don/Doff right shoe Lower body dressing/undressing: 4: Min-Patient completed 75 plus % of tasks  FIM - Toileting Toileting steps completed by patient: Adjust clothing prior to toileting;Performs perineal hygiene;Adjust clothing after toileting Toileting Assistive Devices: Grab bar or rail for support Toileting: 4: Steadying assist  FIM - Diplomatic Services operational officerToilet Transfers Toilet Transfers Assistive Devices: Art gallery managerWalker Toilet Transfers: 4-To toilet/BSC: Min A (steadying Pt. > 75%);4-From toilet/BSC: Min A (steadying Pt. > 75%)  FIM - BankerBed/Chair Transfer Bed/Chair Transfer Assistive Devices: Walker;Arm rests Bed/Chair Transfer: 5: Bed > Chair or W/C: Supervision (verbal cues/safety issues)  FIM - Locomotion: Wheelchair Distance: 90 Locomotion: Wheelchair: 2: Travels 50 - 149 ft with supervision, cueing or coaxing FIM - Locomotion: Ambulation Locomotion: Ambulation Assistive Devices: Walker - Rolling;Cane - Quad Ambulation/Gait Assistance: 4: Min guard;3: Mod assist (min guard with rolling walker; mod A with LBQC) Locomotion: Ambulation: 0: Activity did not occur  Comprehension Comprehension Mode: Auditory Comprehension: 5-Follows basic conversation/direction: With extra time/assistive device  Expression Expression Mode: Verbal Expression: 3-Expresses basic 50 - 74% of the time/requires cueing 25 - 50% of the time. Needs to repeat parts of sentences.  Social Interaction Social Interaction: 4-Interacts appropriately 75 - 89% of the time - Needs redirection for appropriate language or to initiate interaction.  Problem Solving Problem Solving: 4-Solves basic 75 - 89% of the time/requires cueing 10 - 24% of the  time  Memory Memory: 4-Recognizes or recalls 75 - 89% of the time/requires cueing 10 - 24% of the time  Medical Problem List and Plan:  1. Acute left temporoparietal infarct secondary to left carotid stenosis  2. DVT Prophylaxis/Anticoagulation: Subcutaneous Lovenox. Monitor platelet counts and  any signs of bleeding  3. Pain Management: Tylenol as needed.Voltaren Gel as needed for knee pain  4. Mood/anxiety: Xanax 0.25 mg each bedtime as needed  5. Neuropsych: This patient is capable of making decisions on her own behalf.  6. Left ICA stenosis. Followup vascular surgery/Dr. Darrick Penna outpatient question stent versus CEA 4-6 weeks  7. CAD/LVD. Followup cardiology services plan Lexiscan and followup appointment second week in February as outpatient  8. Hypertension. Lopressor 50 mg twice a day. Monitor with increased mobility  9. Systolic congestive heart failure. Lasix 20 mg daily. Monitor for any signs of fluid overload  10. Vitrectomy 09/29/2013. Followup ophthalmology services  11. Stage III cocygeal ulcer. Wound care nurse followup. Discussed with RN will order mattress overlay 12. Hyperlipidemia. Lipitor 13. RLS--increased scheduled mirapex to 0.5mg  qhs--better on this dose   LOS (Days) 6 A FACE TO FACE EVALUATION WAS PERFORMED  KIRSTEINS,ANDREW E 10/12/2013, 8:47 AM

## 2013-10-12 NOTE — Progress Notes (Signed)
Recreational Therapy Session Note  Patient Details  Name: [name] MRN: [id number] Date of Birth: 08-Jul-1931 Today's Date: 10/12/2013  Pain: no c/o Skilled Therapeutic Interventions/Progress Updates: Session focused on activity tolerance, standing tolerance & balance, LUE use, & word finding/naming tasks during horticultural therapy activity.  Pt stood with 1 UE on RW to arrange fresh flowers in vase with close supervision & min cues to use RUE.  Pt required min phonemic cues and/or sentence completion cues for word finding during activity.  Therapy/Group: Co-Treatment   Aalayah Riles 10/12/2013, 3:50 PM

## 2013-10-13 ENCOUNTER — Inpatient Hospital Stay (HOSPITAL_COMMUNITY): Payer: Medicare Other

## 2013-10-13 ENCOUNTER — Inpatient Hospital Stay (HOSPITAL_COMMUNITY): Payer: Medicare Other | Admitting: Physical Therapy

## 2013-10-13 ENCOUNTER — Inpatient Hospital Stay (HOSPITAL_COMMUNITY): Payer: Medicare Other | Admitting: Occupational Therapy

## 2013-10-13 ENCOUNTER — Other Ambulatory Visit: Payer: Self-pay | Admitting: *Deleted

## 2013-10-13 DIAGNOSIS — I739 Peripheral vascular disease, unspecified: Secondary | ICD-10-CM

## 2013-10-13 DIAGNOSIS — I6529 Occlusion and stenosis of unspecified carotid artery: Secondary | ICD-10-CM

## 2013-10-13 LAB — CREATININE, SERUM
Creatinine, Ser: 0.88 mg/dL (ref 0.50–1.10)
GFR, EST AFRICAN AMERICAN: 70 mL/min — AB (ref >90)
GFR, EST NON AFRICAN AMERICAN: 60 mL/min — AB (ref >90)

## 2013-10-13 NOTE — Progress Notes (Signed)
Physical Therapy Note  Patient Details  Name: Pieter PartridgeMayfie A Sindoni MRN: [id number] Date of Birth: 05-31-1931 Today's Date: 10/13/2013  Time: 1325-1420 55 minutes  1:1 Pt c/o shoulder pain with gait, eases with rest.  W/c mobility in controlled environment with B UEs and LEs with supervision, cues for set up.  W/c mobility performed in community environment around gift shop with supervision.  Gait with RW with supervision in community environment with supervision, frequent standing rests due to fatigue.  Stair negotiation x 5 stairs with B handrails at close supervision/min A level.  Pt requires min A at final stair due to fatigue.  Ramp negotiation with RW since pt states she will have ramp at home.  Pt able to perform with supervision, cues for technique and safety.  Bed mobility in ADL apartment with supervision, no physical assistance needed.  Pt requires frequent rests and cues for safety, no LOB during gait or transfers, 1 LOB on stairs due to fatigue in B LEs.  Pt reports she feels comfortable with d/c home tomorrow and has planned for assistance at home.   Danaja Lasota 10/13/2013, 2:22 PM

## 2013-10-13 NOTE — Progress Notes (Signed)
Occupational Therapy Discharge Summary  Patient Details  Name: [name] MRN: [id number] Date of Birth: Mar 31, 1931  Today's Date: 10/13/2013 Time: 2992-4268 Time Calculation (min): 45 min  Patient has met 9 of 9 long term goals due to improved activity tolerance, improved balance, postural control, ability to compensate for deficits, improved awareness and improved coordination.  Patient to discharge at overall supervision-min assist level.  Patient's care partner is independent to provide the necessary physical and cognitive assistance at discharge.    Reasons goals not met: N/A. All LTGs met.   Recommendation:  Patient will benefit from ongoing skilled OT services in home health setting to continue to advance functional skills in the area of BADLs, activity tolerance, strengthening, functional transfers, postural control in standing, and standing balance.  Equipment: No equipment provided  Reasons for discharge: treatment goals met and discharge from hospital  Patient/family agrees with progress made and goals achieved: Yes  Skilled Therapeutic Intervention Pt seen for ADL retraining with focus on functional transfers, activity tolerance, and standing balance. Pt received supine in bed and requesting to complete toilet task. Ambulated at supervision level to bathroom and completed toilet transfer and toilet task at supervision level. Completed bathing and dressing at goal level (min assist bathing and LB dressing and setup assist UB dressing). Pt with word finding difficulties throughout session requiring mod cueing for utilizing strategies to correct. Pt completed all sit<>stand at supervision level and required frequent rest breaks d/t fatigue. At end of session pt left sitting in w/c with breakfast tray in front of her. Pt opened all containers without assist. Pt eager about discharge and no questions or concerns about discharge at this time.   OT  Discharge Precautions/Restrictions  Precautions Precautions: Fall Restrictions Weight Bearing Restrictions: No General   Vital Signs Therapy Vitals Temp: 97.3 F (36.3 C) Temp src: Oral Pulse Rate: 55 Resp: 18 BP: 139/71 mmHg Patient Position, if appropriate: Lying Oxygen Therapy SpO2: 97 % O2 Device: None (Room air) Pain Pain Assessment Pain Assessment: No/denies pain Pain Score: Asleep Pain Type: Chronic pain Pain Location: Generalized Pain Descriptors / Indicators: Aching;Discomfort Pain Frequency: Constant Pain Onset: On-going Pain Intervention(s): Medication (See eMAR) ADL   Vision/Perception  Vision - History Baseline Vision: Wears glasses only for reading Visual History: Other (comment) (virectomy) Patient Visual Report: Blurring of vision (blurred vision in R eye PTA) Vision - Assessment Eye Alignment: Within Functional Limits Perception Perception: Within Functional Limits Praxis Praxis: Intact  Cognition Overall Cognitive Status: Within Functional Limits for tasks assessed Arousal/Alertness: Awake/alert Orientation Level: Oriented X4 Attention: Selective;Sustained Sustained Attention: Appears intact Selective Attention: Appears intact Memory: Impaired Memory Impairment: Decreased recall of new information Awareness: Appears intact Problem Solving: Appears intact Reasoning: Appears intact Decision Making: Appears intact Safety/Judgment: Appears intact Sensation Sensation Light Touch: Appears Intact Hot/Cold: Appears Intact Coordination Gross Motor Movements are Fluid and Coordinated: No Fine Motor Movements are Fluid and Coordinated: No Finger Nose Finger Test: having FM deficits PTA Motor    Mobility     Trunk/Postural Assessment     Balance   Extremity/Trunk Assessment RUE Assessment RUE Assessment: Within Functional Limits LUE Assessment LUE Assessment: Exceptions to WFL (decreased ROM from c2 fx)  See FIM for current  functional status  Sanora Cunanan N 10/13/2013, 8:19 AM

## 2013-10-13 NOTE — Progress Notes (Signed)
Occupational Therapy Session Note  Patient Details  Name: [name] MRN: [id number] Date of Birth: Feb 14, 1931  Today's Date: 10/13/2013 Time: 1500-1530 Time Calculation (min): 30 min  Short Term Goals: Week 1:  OT Short Term Goal 1 (Week 1): Focus on LTGs  Skilled Therapeutic Interventions/Progress Updates:  Patient resting in w/c upon arrival.  Engaged in w/c propulsion using legs and feet to push forward and backward, activity tolerance, standing tolerance and balance, side stepping, reaching in kitchen cabinets and bed transfer.  Patient is overall supervision with above tasks and initiated need to roll onto her side in bed to relieve pressure in her bottom.  Therapy Documentation Precautions:  Precautions Precautions: Fall Restrictions Weight Bearing Restrictions: No Pain: Denies pain, premedicated  Therapy/Group: Individual Therapy  Promyse Ardito 10/13/2013, 5:14 PM

## 2013-10-13 NOTE — Progress Notes (Signed)
Recreational Therapy Assessment and Plan  Patient Details  Name: [name] MRN: [id number] Date of Birth: 09/21/1930 Today's Date: 10/13/2013  Rehab Potential: Good  ELOS: 1 week  Assessment Clinical Impression:Problem List:  Patient Active Problem List    Diagnosis  Date Noted   .  UTI (urinary tract infection)  10/01/2013   .  Occlusion and stenosis of carotid artery with cerebral infarction  10/01/2013   .  CVA (cerebral infarction)  09/30/2013   .  Slurred speech  09/30/2013   .  Anemia of other chronic disease  03/11/2013   .  Osteoporosis  12/25/2012   .  Spinal stenosis of lumbar region  12/25/2012   .  Chronic back pain  12/25/2012   .  Hip pain- LEFT  12/25/2012   .  AKI (acute kidney injury)  12/25/2012   .  Fall  12/25/2012   .  Anemia  12/25/2012   .  Hyponatremia  12/25/2012   .  Cervical spine fracture  12/25/2012   .  RLS (restless legs syndrome)  12/25/2012   .  Varicose veins  12/25/2012   .  CHF (congestive heart failure)    .  Coronary artery disease    .  Hypertension    .  Arthritis     Past Medical History:  Past Medical History   Diagnosis  Date   .  CHF (congestive heart failure)    .  Coronary artery disease    .  Hypertension    .  Arthritis    .  Edema of extremities    .  Anxiety    .  HLD (hyperlipidemia)     Past Surgical History:  Past Surgical History   Procedure  Laterality  Date   .  Total knee arthroplasty  Bilateral      knee replacements   .  Lumbar disc surgery     .  Cholecystectomy     .  Posterior cervical fusion/foraminotomy  N/A  12/28/2012     Procedure: POSTERIOR CERVICAL FUSION/FORAMINOTOMY LEVEL 3; Surgeon: Kristeen Miss, MD; Location: [facility]; Service: Neurosurgery; Laterality: N/A; Posterior Cervical Three-Six Laminectomy and Fusion   .  Angioplasty     .  Tonsillectomy      Assessment & Plan  Clinical Impression: An 80 y.o. RH-female with history of CHF, CAD, C2 dens fracture with left sided weakness;  who underwent vitrectomy on 09/29/13 and on follow up in office next day developed acute onset of confusion and inability to talk. CT head without acute changes. CTA head/neck with 80-90% heavily calcified left internal carotid artery stenosis with potentially flow reduction and no intracranial stenosis, occlusion, or visible acute infarct. MRI brain done revealing acute left MCA infarct predominantly involving the temporoparietal region. 2D echo with EF 40-45% with moderate hypokinesis, grade 2 diastolic dysfunction and mild to moderate MVR. Dr Oneida Alar consulted and recommends stent v/s CEA in 4-6 weeks for symptomatic carotid stenosis. Dr. Wynonia Lawman consulted for input on echo and recommended medical management as patient without clinical HF. Neurology recommended increasing ASA to 325 mg daily for stroke due to L-CAS. Maintained on subcutaneous Lovenox for DVT prophylaxis. She is tolerating a regular diet. WOC consulted for input on stage III cocygeal ulcer and calcium alginate ordered for treatment. Physical occupational therapy evaluations completed an ongoing with recommendations for physical medicine rehabilitation consult to consider inpatient rehabilitation services. Patient transferred to CIR on 10/06/2013.    Pt presented with  decreased activity tolerance, decreased functional mobility, decreased balance, aphasia, left sided weakness limiting pt's independence with leisure/community pursuits.    Recommendations for other services: None  Discharge Criteria: Patient will be discharged from TR if patient refuses treatment 3 consecutive times without medical reason.  If treatment goals not met, if there is a change in medical status, if patient makes no progress towards goals or if patient is discharged from hospital.  The above assessment, treatment plan, treatment alternatives and goals were discussed and mutually agreed upon: by patient  Mathews 10/13/2013, 4:00 PM

## 2013-10-13 NOTE — Progress Notes (Signed)
Physical Therapy Note  Patient Details  Name: [name] MRN: [id number] Date of Birth: 11/06/1930 Today's Date: 10/13/2013 [id number] 30 min No pain reported  W/c propulsion x 7875' with bil UEs, for activity tolerance, supervision. Toss/catch a beach ball x 15 in sitting for core strengthening and activity tolerance.  Therapeutic exercise performed with LE to increase strength for functional mobility before gait: 1 x 20 marching, and 1 x 15 long arc knee ext, both done in sitting.  Gait with RW x 687' with close supervision, cues for upright posture as much as possible. Up/down 5 steps with 2 rails, min guard assist, with 1 instance of slight knee buckling, recovered independently.  Tarahji Ramthun 10/13/2013, 7:54 AM

## 2013-10-13 NOTE — Progress Notes (Signed)
Speech Language Pathology Discharge Summary & Final Treatment Note  Patient Details  Name: [name] MRN: [id number] Date of Birth: 1931/06/26  Today's Date: 10/13/2013 Time: 1130-1205 Time Calculation (min): 35 min  Skilled Therapeutic Interventions:  Skilled treatment focused on education and linguistic goals. SLP facilitated session with review of word-finding strategies. Pt utilized strategies during linguistic task with Min phonemic cues and verbal cues to utilize strategies. Pt participated in conversation regarding current level of function as well as personal interests and goals with supervision level cueing.      Patient has met 2 of 2 long term goals.  Patient to discharge at [facility] level.  Reasons goals not met: N/A   Clinical Impression/Discharge Summary: Pt has met 2 out of 2 LTGs during this admission, with increased use of word-finding strategies to facilitate verbal communication. Pt is able to express her basic wants/needs with supervision level assistance for use of strategies and extra time, and requires Min cues with more structured linguistic tasks. Education is completed with patient and her daughter [name]. Pt will benefit from additional SLP f/u to maximize functional communication upon discharge home with family.  Care Partner:  Caregiver Able to Provide Assistance: Yes  Type of Caregiver Assistance:  (linguistic)  Recommendation:  Home Health SLP;24 hour supervision/assistance  Rationale for SLP Follow Up: Maximize functional communication   Equipment:  None recommended by SLP  Reasons for discharge: Treatment goals met;Discharged from hospital   Patient/Family Agrees with Progress Made and Goals Achieved: Yes   See FIM for current functional status   [name], M.A. CCC-SLP [phone]   [name] 10/13/2013, 3:27 PM

## 2013-10-13 NOTE — Progress Notes (Signed)
Subjective/Complaints:  Happy about D/C in am  Review of Systems -shoulder and leg pain improved on hydrocodone Objective: Vital Signs: Blood pressure 139/71, pulse 55, temperature 97.3 F (36.3 C), temperature source Oral, resp. rate 18, height _0  (1.575 m), weight 71.215 kg (157 lb), SpO2 97.00%. No results found. Results for orders placed during the hospital encounter of 10/06/13 (from the past 72 hour(s))  CREATININE, SERUM     Status: Abnormal   Collection Time    10/13/13  5:30 AM      Result Value Range   Creatinine, Ser 0.88  0.50 - 1.10 mg/dL   GFR calc non Af Amer 60 (*) >90 mL/min   GFR calc Af Amer 70 (*) >90 mL/min   Comment: (NOTE)     The eGFR has been calculated using the CKD EPI equation.     This calculation has not been validated in all clinical situations.     eGFR's persistently <90 mL/min signify possible Chronic Kidney     Disease.     HEENT: normal Cardio: RRR and no murmur Resp: CTA B/L and unlabored GI: BS positive and non distended Extremity:  Pulses positive and No Edema Skin:   Other healed bilat TKR incisions Neuro: Cranial Nerve II-XII normal, Abnormal Sensory on right  Abnormal Motor 4-/5 RUE and 4/5 RLE, 5-/5 on left side, Abnormal FMC Ataxic/ dec FMC and Aphasic Musc/Skel:  Other chronic bilateral wrist joint swelling Gen NAD   Assessment/Plan: 1. Functional deficits secondary to Left MCA infarct which require 3+ hours per day of interdisciplinary therapy in a comprehensive inpatient rehab setting. Physiatrist is providing close team supervision and 24 hour management of active medical problems listed below. Physiatrist and rehab team continue to assess barriers to discharge/monitor patient progress toward functional and medical goals. Tent D/C in am FIM: FIM - Bathing Bathing Steps Patient Completed: Chest;Right Arm;Left Arm;Abdomen;Front perineal area;Right upper leg;Left upper leg;Right lower leg (including foot);Left lower leg  (including foot) Bathing: 4: Min-Patient completes 8-9 59f10 parts or 75+ percent  FIM - Upper Body Dressing/Undressing Upper body dressing/undressing steps patient completed: Thread/unthread right sleeve of pullover shirt/dresss;Thread/unthread left sleeve of pullover shirt/dress;Put head through opening of pull over shirt/dress;Pull shirt over trunk Upper body dressing/undressing: 5: Set-up assist to: Obtain clothing/put away FIM - Lower Body Dressing/Undressing Lower body dressing/undressing steps patient completed: Thread/unthread right underwear leg;Thread/unthread left underwear leg;Pull underwear up/down;Thread/unthread right pants leg;Thread/unthread left pants leg;Pull pants up/down;Don/Doff right sock;Fasten/unfasten right shoe;Fasten/unfasten left shoe (elastic shoe laces) Lower body dressing/undressing: 4: Min-Patient completed 75 plus % of tasks  FIM - Toileting Toileting steps completed by patient: Adjust clothing prior to toileting;Performs perineal hygiene;Adjust clothing after toileting Toileting Assistive Devices: Grab bar or rail for support Toileting: 5: Supervision: Safety issues/verbal cues  FIM - TRadio producerDevices: WInsurance account managerTransfers: 0-Activity did not occur  FIM - BControl and instrumentation engineerDevices: WEnvironmental consultantArm rests Bed/Chair Transfer: 5: Bed > Chair or W/C: Supervision (verbal cues/safety issues);5: Chair or W/C > Bed: Supervision (verbal cues/safety issues)  FIM - Locomotion: Wheelchair Distance: 125 Locomotion: Wheelchair: 4: Travels 150 ft or more: maneuvers on rugs and over door sillls with minimal assistance (Pt.>75%) FIM - Locomotion: Ambulation Locomotion: Ambulation Assistive Devices: WAdministratorAmbulation/Gait Assistance: 5: Supervision Locomotion: Ambulation: 2: Travels 50 - 149 ft with supervision/safety issues  Comprehension Comprehension Mode: Auditory Comprehension: 7-Follows  complex conversation/direction: With no assist  Expression Expression Mode: Verbal Expression: 5-Expresses basic needs/ideas: With no assist  Social Interaction Social Interaction: 6-Interacts appropriately with others with medication or extra time (anti-anxiety, antidepressant).  Problem Solving Problem Solving: 5-Solves basic problems: With no assist  Memory Memory: 7-Complete Independence: No helper  Medical Problem List and Plan:  1. Acute left temporoparietal infarct secondary to left carotid stenosis  2. DVT Prophylaxis/Anticoagulation: Subcutaneous Lovenox. Monitor platelet counts and any signs of bleeding  3. Pain Management: Tylenol as needed.Voltaren Gel as needed for knee pain  4. Mood/anxiety: Xanax 0.25 mg each bedtime as needed  5. Neuropsych: This patient is capable of making decisions on her own behalf.  6. Left ICA stenosis. Followup vascular surgery/Dr. Oneida Alar outpatient question stent versus CEA 4-6 weeks  7. CAD/LVD. Followup cardiology services plan [place] and followup appointment second week in February as outpatient  8. Hypertension. Lopressor 50 mg twice a day. Monitor with increased mobility  9. Systolic congestive heart failure. Lasix 20 mg daily. Monitor for any signs of fluid overload  10. Vitrectomy 09/29/2013. Followup ophthalmology services  11. Stage III cocygeal ulcer. Wound care nurse followup. Discussed with RN will order mattress overlay 12. Hyperlipidemia. Lipitor 13. RLS--increased scheduled mirapex to 0.39m qhs--better on this dose   LOS (Days) 7 A FACE TO FACE EVALUATION WAS PERFORMED  KIRSTEINS,ANDREW E 10/13/2013, 8:46 AM

## 2013-10-13 NOTE — Discharge Summary (Signed)
Discharge summary job [id number]

## 2013-10-13 NOTE — Progress Notes (Signed)
Recreational Therapy Discharge Summary Patient Details  Name: [name] MRN: [id number] Date of Birth: 05/14/1931 Today's Date: 10/13/2013  Long term goals set: 1  Long term goals met: 1  Comments on progress toward goals: Pt has made good progress toward goal and is ready for discharge home at supervision level for simple TR tasks. Pt is discharging home with the needed supervision/assist. Reasons for discharge: discharge from hospital Patient/family agrees with progress made and goals achieved: Yes  SIMPSON,LISA 10/13/2013, 4:09 PM     

## 2013-10-13 NOTE — Patient Care Conference (Signed)
Inpatient RehabilitationTeam Conference and Plan of Care Update Date: 10/12/2013   Time: 10:40 AM    Patient Name: [name]      Medical Record Number: [id number]  Date of Birth: Mar 27, 1931 Sex: Female         Room/Bed: 4M05C/4M05C-01 Payor Info: Payor: [employer]UE [employer]ICARE / Plan: BLUE MEDICARE / Product Type: *No Product type* /    Admitting Diagnosis: L CVA  Admit Date/Time:  10/06/2013  6:46 PM Admission Comments: No comment available   Primary Diagnosis:  <principal problem not specified> Principal Problem: <principal problem not specified>  Patient Active Problem List   Diagnosis Date Noted  . UTI (urinary tract infection) 10/01/2013  . Occlusion and stenosis of carotid artery with cerebral infarction 10/01/2013  . CVA (cerebral infarction) 09/30/2013  . Slurred speech 09/30/2013  . Anemia of other chronic disease 03/11/2013  . Osteoporosis 12/25/2012  . Spinal stenosis of lumbar region 12/25/2012  . Chronic back pain 12/25/2012  . Hip pain- LEFT 12/25/2012  . AKI (acute kidney injury) 12/25/2012  . Fall 12/25/2012  . Anemia 12/25/2012  . Hyponatremia 12/25/2012  . Cervical spine fracture 12/25/2012  . RLS (restless legs syndrome) 12/25/2012  . Varicose veins 12/25/2012  . CHF (congestive heart failure)   . Coronary artery disease   . Hypertension   . Arthritis     Expected Discharge Date: Expected Discharge Date: 10/14/13  Team Members Present: Physician leading conference: Dr. Claudette Laws Social Worker Present: Dossie Der, LCSW;Jenny Shirl Weir, LCSW Nurse Present: Other (comment) Misty Stanley Lemmings, RN) PT Present: Other (comment);Wanda Plump, PT ([address] [place], [place]) OT Present: Bretta Bang, OT SLP Present: Maxcine Ham, SLP PPS Coordinator present : Tora Duck, RN, CRRN     Current Status/Progress Goal Weekly Team Focus  Medical   coccyx pain with activity, continent  maintain comfort  adjust meds   Bowel/Bladder   Continent of bowel and bladder. LBM 10/09/13, per pt's report.  Pt to remain continent of bowel and bladder  Monitor   Swallow/Nutrition/ Hydration             ADL's   min assist bathing and dressing and transfers, steadying assist toilet task, min-supervision for sit<>stand depending on fatigue level   supervision overall, min assist LB dressing and bathing  activity tolerance, strengtheing, postural control in standing, standing balance, strengthening, family/pt education   Mobility   Supervision with transfers, min A with gait, mod I with pressure relief technique; pt limited by pain in coccyx and L shoulder  Supervision overall  Gait training; activity tolerance; standing balance; w/c mobility; continued hands-on family training   Communication   Min cues for use of word-finding strategies during structured tasks, Mod I to communicate wants/needs  supervision-Mod I  increase use of word-finding strategies, education   Safety/Cognition/ Behavioral Observations            Pain   Vicodin x 1 tab q 6hrs, prn d/t pressure ulcer and L shoulder discomfort  <3  Offer pain medication 1hr prior to therapy session. Monitor for effectiveness   Skin   Stage III to sacrum. Requiring daily dressing change  No additional skin breakdown  Monitor for healing q shift    Rehab Goals Patient on target to meet rehab goals: Yes Rehab Goals Revised: None *See Care Plan and progress notes for long and short-term goals.  Barriers to Discharge: needs equipment for wound prevention    Possible Resolutions to Barriers:  order  Discharge Planning/Teaching Needs:  Pt to return to her home to the care of family and paid caregivers.  Pt's dtr has been present for therapies. No further family education needed.   Team Discussion:  Pt is at supervision for mobility and is still working on increasing standing endurance.  Pt continues to have pain, but does not always ask for pain medication.  Dr. Wynn BankerKirsteins has  decided to schedule pain medication every 6 hours.  Pt is working toward her goals with speech therapy.  She will [facility], as she has a stage 3 wound on her coccyx.  Pt to get an overlay air mattress.  Her restless legs are feeling better with medication.  Pt will need home health when she discharges.  Revisions to Treatment Plan:  None   Continued Need for Acute Rehabilitation Level of Care: The patient requires daily medical management by a physician with specialized training in physical medicine and rehabilitation for the following conditions: Daily direction of a multidisciplinary physical rehabilitation program to ensure safe treatment while eliciting the highest outcome that is of practical value to the patient.: Yes Daily medical management of patient stability for increased activity during participation in an intensive rehabilitation regime.: Yes Daily analysis of laboratory values and/or radiology reports with any subsequent need for medication adjustment of medical intervention for : Neurological problems;Other  Shant Hence, Vista DeckJennifer Capps 10/13/2013, 2:20 PM

## 2013-10-14 DIAGNOSIS — G8110 Spastic hemiplegia affecting unspecified side: Secondary | ICD-10-CM

## 2013-10-14 DIAGNOSIS — I6330 Cerebral infarction due to thrombosis of unspecified cerebral artery: Secondary | ICD-10-CM

## 2013-10-14 MED ORDER — ALPRAZOLAM 0.25 MG PO TABS: ORAL_TABLET | ORAL | Status: DC

## 2013-10-14 MED ORDER — METOPROLOL TARTRATE 50 MG PO TABS: 50.0000 mg | ORAL_TABLET | Freq: Two times a day (BID) | ORAL | Status: DC

## 2013-10-14 MED ORDER — HYDROCODONE-ACETAMINOPHEN 5-325 MG PO TABS: 1.0000 | ORAL_TABLET | Freq: Four times a day (QID) | ORAL | Status: DC | PRN

## 2013-10-14 MED ORDER — ASPIRIN 325 MG PO TABS: 325.0000 mg | ORAL_TABLET | Freq: Every day | ORAL | Status: DC

## 2013-10-14 MED ORDER — PANTOPRAZOLE SODIUM 40 MG PO TBEC: 40.0000 mg | DELAYED_RELEASE_TABLET | Freq: Every day | ORAL | Status: DC

## 2013-10-14 MED ORDER — PREDNISOLONE ACETATE 1 % OP SUSP: 1.0000 [drp] | Freq: Three times a day (TID) | OPHTHALMIC | Status: DC

## 2013-10-14 MED ORDER — FUROSEMIDE 20 MG PO TABS: 20.0000 mg | ORAL_TABLET | Freq: Every day | ORAL | Status: DC

## 2013-10-14 MED ORDER — DICLOFENAC SODIUM 1 % TD GEL: 2.0000 g | Freq: Four times a day (QID) | TRANSDERMAL | Status: DC | PRN

## 2013-10-14 MED ORDER — DORZOLAMIDE HCL-TIMOLOL MAL 2-0.5 % OP SOLN: 1.0000 [drp] | Freq: Two times a day (BID) | OPHTHALMIC | Status: DC

## 2013-10-14 MED ORDER — ATORVASTATIN CALCIUM 40 MG PO TABS: 40.0000 mg | ORAL_TABLET | Freq: Every day | ORAL | Status: DC

## 2013-10-14 NOTE — Progress Notes (Signed)
Social Work Discharge Note  The overall goal for the admission was met for:   Discharge location: Yes - home  Length of Stay: Yes - 8 days  Discharge activity level: Yes - Supervision  Home/community participation: Yes  Services provided included: MD, RD, PT, OT, SLP, RN, TR, Pharmacy and [place]: [facility] Medicare  Follow-up services arranged: Home Health: PT, OT, SLP, RN  and Patient/Family request agency HH: [place] [facility]  No DME ordered  Comments (or additional information):  Patient/Family verbalized understanding of follow-up arrangements: Yes  Individual responsible for coordination of the follow-up plan: pt and her daughter, [name]  Confirmed correct DME delivered: Trey Sailors 10/14/2013    Monia Timmers, Silvestre Mesi

## 2013-10-14 NOTE — Progress Notes (Signed)
Subjective/Complaints:  No problems overnight,pleased about progress in PT  Review of Systems -shoulder and leg pain improved on hydrocodone Objective: Vital Signs: Blood pressure 125/72, pulse 71, temperature 97.4 F (36.3 C), temperature source Oral, resp. rate 17, height _0  (1.575 m), weight 71.215 kg (157 lb), SpO2 100.00%. No results found. Results for orders placed during the hospital encounter of 10/06/13 (from the past 72 hour(s))  CREATININE, SERUM     Status: Abnormal   Collection Time    10/13/13  5:30 AM      Result Value Range   Creatinine, Ser 0.88  0.50 - 1.10 mg/dL   GFR calc non Af Amer 60 (*) >90 mL/min   GFR calc Af Amer 70 (*) >90 mL/min   Comment: (NOTE)     The eGFR has been calculated using the CKD EPI equation.     This calculation has not been validated in all clinical situations.     eGFR's persistently <90 mL/min signify possible Chronic Kidney     Disease.     HEENT: normal Cardio: RRR and no murmur Resp: CTA B/L and unlabored GI: BS positive and non distended Extremity:  Pulses positive and No Edema Skin:   Other healed bilat TKR incisions Neuro: Cranial Nerve II-XII normal, Abnormal Sensory on right  Abnormal Motor 4-/5 RUE and 4/5 RLE, 5-/5 on left side, Abnormal FMC Ataxic/ dec FMC and Aphasic Musc/Skel:  Other chronic bilateral wrist joint swelling Gen NAD   Assessment/Plan: 1. Functional deficits secondary to Left MCA infarct which require 3+ hours per day of interdisciplinary therapy in a comprehensive inpatient rehab setting. Stable for D/C today F/u PCP in 1-2 weeks F/u PM&R 3 weeks See D/C summary See D/C instructions FIM: FIM - Bathing Bathing Steps Patient Completed: Chest;Right Arm;Left Arm;Abdomen;Front perineal area;Right upper leg;Left upper leg;Right lower leg (including foot);Left lower leg (including foot) Bathing: 4: Min-Patient completes 8-9 18f10 parts or 75+ percent  FIM - Upper Body Dressing/Undressing Upper body  dressing/undressing steps patient completed: Thread/unthread right sleeve of pullover shirt/dresss;Thread/unthread left sleeve of pullover shirt/dress;Put head through opening of pull over shirt/dress;Pull shirt over trunk Upper body dressing/undressing: 5: Set-up assist to: Obtain clothing/put away FIM - Lower Body Dressing/Undressing Lower body dressing/undressing steps patient completed: Thread/unthread right underwear leg;Thread/unthread left underwear leg;Pull underwear up/down;Thread/unthread right pants leg;Thread/unthread left pants leg;Pull pants up/down;Don/Doff right sock;Fasten/unfasten right shoe;Fasten/unfasten left shoe (elastic shoe laces) Lower body dressing/undressing: 4: Min-Patient completed 75 plus % of tasks  FIM - Toileting Toileting steps completed by patient: Adjust clothing prior to toileting;Performs perineal hygiene;Adjust clothing after toileting Toileting Assistive Devices: Grab bar or rail for support Toileting: 5: Supervision: Safety issues/verbal cues  FIM - TRadio producerDevices: WInsurance account managerTransfers: 0-Activity did not occur  FIM - BControl and instrumentation engineerDevices: WEnvironmental consultantArm rests Bed/Chair Transfer: 5: Chair or W/C > Bed: Supervision (verbal cues/safety issues);5: Bed > Chair or W/C: Supervision (verbal cues/safety issues)  FIM - Locomotion: Wheelchair Distance: 75 Locomotion: Wheelchair: 5: Travels 150 ft or more: maneuvers on rugs and over door sills with supervision, cueing or coaxing FIM - Locomotion: Ambulation Locomotion: Ambulation Assistive Devices: WAdministratorAmbulation/Gait Assistance: 5: Supervision Locomotion: Ambulation: 2: Travels 50 - 149 ft with supervision/safety issues  Comprehension Comprehension Mode: Auditory Comprehension: 7-Follows complex conversation/direction: With no assist  Expression Expression Mode: Verbal Expression: 6-Expresses complex ideas: With extra  time/assistive device  Social Interaction Social Interaction: 6-Interacts appropriately with others with medication or extra time (  anti-anxiety, antidepressant).  Problem Solving Problem Solving: 5-Solves complex 90% of the time/cues < 10% of the time  Memory Memory: 7-Complete Independence: No helper  Medical Problem List and Plan:  1. Acute left temporoparietal infarct secondary to left carotid stenosis  2. DVT Prophylaxis/Anticoagulation: no sq lovenox needed 3. Pain Management: Tylenol as needed.Voltaren Gel as needed for knee pain  4. Mood/anxiety: Xanax 0.25 mg each bedtime as needed  5. Neuropsych: This patient is capable of making decisions on her own behalf.  6. Left ICA stenosis. Followup vascular surgery/Dr. Oneida Alar outpatient question stent versus CEA 4-6 weeks  7. CAD/LVD. Followup cardiology services plan [place] and followup appointment second week in February as outpatient  8. Hypertension. Lopressor 50 mg twice a day. Monitor with increased mobility  9. Systolic congestive heart failure. Lasix 20 mg daily. Monitor for any signs of fluid overload  10. Vitrectomy 09/29/2013. Followup ophthalmology services  11. Stage III cocygeal ulcer. Wound care center f/u.  12. Hyperlipidemia. Lipitor 13. RLS--increased scheduled mirapex to 0.43m qhs--better on this dose   LOS (Days) 8 A FACE TO FACE EVALUATION WAS PERFORMED  KIRSTEINS,ANDREW E 10/14/2013, 10:16 AM

## 2013-10-14 NOTE — Progress Notes (Signed)
Physical Therapy Discharge Summary  Patient Details  Name: Jaella Weinert Silva MRN: [id number] Date of Birth: [birth year]  Today's Date: 10/14/2013 Time: N/A; no billable PT services provided  Patient has met 14 of 14 long term goals due to improved activity tolerance, improved balance, improved postural control, increased strength, decreased pain and ability to compensate for deficits.  Patient to discharge at an ambulatory level Supervision.   Patient's care partner is independent to provide the necessary physical assistance at discharge.  Reasons goals not met: N/A; all goals met.  Recommendation:  Patient will benefit from ongoing skilled PT services in home health setting to continue to advance safe functional mobility, address ongoing impairments in stability/independence with functional mobility, and minimize fall risk.  Equipment: Roho wheelchair cushion [phone]  Reasons for discharge: treatment goals met and discharge from hospital  Patient/family agrees with progress made and goals achieved: Yes  PT Discharge Precautions/Restrictions Precautions Precautions: Fall Restrictions Weight Bearing Restrictions: No Pain Pain Assessment Pain Assessment: No/denies pain Vision/Perception  Vision - History Baseline Vision: Wears glasses only for reading Visual History: Other (comment) (virectomy) Patient Visual Report: Blurring of vision (blurred vision in R eye PTA) Vision - Assessment Eye Alignment: Within Functional Limits Perception Perception: Within Functional Limits Praxis Praxis: Intact  Cognition Overall Cognitive Status: Within Functional Limits for tasks assessed Arousal/Alertness: Awake/alert Orientation Level: Oriented X4 Attention: Selective;Sustained Selective Attention: Appears intact Memory: Impaired Memory Impairment: Decreased recall of new information Awareness: Appears intact Problem Solving: Appears intact Problem Solving Impairment: Functional  basic Executive Function: Reasoning;Decision Making Reasoning: Appears intact Decision Making: Appears intact Safety/Judgment: Appears intact Sensation Sensation Light Touch: Appears Intact Hot/Cold: Appears Intact Proprioception: Appears Intact Coordination Gross Motor Movements are Fluid and Coordinated: No Fine Motor Movements are Fluid and Coordinated: No Finger Nose Finger Test: having FM deficits PTA Motor  Motor Motor: Other (comment);Abnormal postural alignment and control Motor - Discharge Observations: Thoracic kyphosis present PTA, per report of daughter.  Mobility Bed Mobility Bed Mobility: Supine to Sit;Sit to Supine;Sitting - Scoot to Edge of Bed Supine to Sit: 7: Independent Sitting - Scoot to Edge of Bed: 7: Independent Sit to Supine: 7: Independent Transfers Transfers: Yes Sit to Stand: 5: Supervision Stand to Sit: 5: Supervision Locomotion  Ambulation Ambulation: Yes Ambulation/Gait Assistance: 5: Supervision Ambulation Distance (Feet): 75 Feet Assistive device: Rolling walker Gait Gait: Yes Gait Pattern: Impaired Gait Pattern: Step-through pattern;Trunk flexed;Decreased stride length;Trendelenburg Gait velocity: decreased Stairs / Additional Locomotion Stairs: Yes Stairs Assistance: 4: Min assist Stair Management Technique: Two rails;Forwards;Step to pattern Number of Stairs: 5 Height of Stairs: 8 Wheelchair Mobility Wheelchair Mobility: Yes Wheelchair Assistance: 5: Supervision Wheelchair Parts Management: Supervision/cueing;Needs assistance;Other (comment);Independent (Assistance required with leg rest management; cueing required for arm rest management. Independent with w/c brakes.) Distance: 150  Trunk/Postural Assessment  Cervical Assessment Cervical Assessment: Within Functional Limits Thoracic Assessment Thoracic Assessment: Exceptions to [facility] Thoracic Strength Overall Thoracic Strength Comments: Postural alignment, awareness with  standing and gait improved, as compared with PT evaluation Lumbar Assessment Lumbar Assessment: Within Functional Limits Postural Control Postural Control: Deficits on evaluation Righting Reactions: Hip and ankle strategies noted with balance perturbations. No stepping strategy noted. Postural Limitations: With min verbal cueing, pt able to correct posterior pelvic tilt in seated, hyperkyphsis in standing.  Balance Balance Balance Assessed: Yes Static Sitting Balance Static Sitting - Balance Support: Feet supported;No upper extremity supported Static Sitting - Level of Assistance: 7: Independent Dynamic Sitting Balance Dynamic Sitting - Balance Support: No upper extremity supported;Feet  supported Dynamic Sitting - Level of Assistance: 6: Modified independent (Device/Increase time) Static Standing Balance Static Standing - Balance Support: Bilateral upper extremity supported Static Standing - Level of Assistance: 6: Modified independent (Device/Increase time) Static Standing - Comment/# of Minutes: Mod I with standing with bilat UE support at rolling walker. Dynamic Standing Balance Dynamic Standing - Balance Support: Bilateral upper extremity supported Dynamic Standing - Level of Assistance: 5: Stand by assistance Dynamic Standing - Comments: Dynamic standing with bilat UE support at rolling walker with supervision. Extremity Assessment  RLE Assessment RLE Assessment: Exceptions to [facility] RLE Strength RLE Overall Strength: Deficits RLE Overall Strength Comments: Based on observation of functional mobility, grossly 4/5. LLE Assessment LLE Assessment: Within Functional Limits  See FIM for current functional status  Cyris Maalouf, Malva Cogan 10/14/2013, 7:11 PM

## 2013-10-14 NOTE — Progress Notes (Signed)
Patient discharged to home with family. Belongings sent with patient. Received discharge instructions from Deatra Inaan Angiulli, PA with verbal understanding.

## 2013-10-14 NOTE — Progress Notes (Signed)
Social Work Patient ID: [name], female   DOB: [birth year], 80 y.o.   MRN: [id number]  CSW met with pt and spoke with pt's dtr via telephone about team conference.  Pt is making progress toward goals.  Told pt that team felt she could be d/c'd on Friday, January 30th and she was pleased.  Dtr will confirm with pt's private duty staff that they will be available starting Friday.  Pt has all necessary equipment with the exception of a Roho cushion that therapists recommended for pt's w/c.  CSW priced this product through [facility], as pt just received a gel cushion in the last year and insurance may not cover the item.  This way pt/family will be aware of the potential cost out of pocket.  The cushion is $453.31 and dtr said that pt would not be able to afford that right now, so CSW left the information for dtr in pt's room in case they are able to obtain it later.  [facility] is aware of pt's d/c and they will send RN, PT, OT, and SLP to follow pt at home, as she has been their pt in the past.  Pt's dtr was able to purchase an affordable over the bed table for pt.  No other needs at this time.  CSW is available as needs arise.

## 2013-10-14 NOTE — Plan of Care (Signed)
Problem: RH Grooming Goal: LTG Patient will perform grooming w/assist,cues/equip (OT) LTG: Patient will perform grooming with assist, with/without cues using equipment (OT)  Outcome: Adequate for Discharge Patient required assistance to reach items at time secondary to L shoulder pain PTA. Patient keeps all items in reach at home.

## 2013-10-14 NOTE — Discharge Instructions (Signed)
Inpatient Rehab Discharge Instructions  [name] Discharge date and time: No discharge date for patient encounter.   Activities/Precautions/ Functional Status: Activity: activity as tolerated Diet: cardiac diet Wound Care: none needed Functional status:  ___ No restrictions     ___ Walk up steps independently ___ 24/7 supervision/assistance   ___ Walk up steps with assistance ___ Intermittent supervision/assistance  ___ Bathe/dress independently ___ Walk with walker     ___ Bathe/dress with assistance ___ Walk Independently    ___ Shower independently __x_ Walk with assistance    ___ Shower with assistance ___ No alcohol     ___ Return to work/school ________  COMMUNITY REFERRALS UPON DISCHARGE:   Home Health:   PT        OT        ST       [facility]  [facility] Phone:  [phone] Medical Equipment/Items Ordered:  Daughter purchased an over-the-bed table.  Roho cushion recommended by therapist and Social Worker left information in your room about it.  No other             equipment recommended or ordered.  GENERAL COMMUNITY RESOURCES FOR PATIENT/FAMILY: Support Groups:  [facility]  Special Instructions: Followup Dr. Darrick PennaFields for evaluation of carotid surgery in 4-6 weeks  Home health nurse wound care stage III pressure ulcer to the coccyx cleanse area with normal saline and pat dry. Lightly coat periwound with protective barrier cream. Fill defect with calcium alginate cover with 4 x 4 dressing change daily as needed My questions have been answered and I understand these instructions. I will adhere to these goals and the provided educational materials after my discharge from the hospital.  Patient/Caregiver Signature _______________________________ Date __________  Clinician Signature _______________________________________ Date __________  Please bring this form and your medication list with you to all your follow-up doctor's appointments.   STROKE/TIA DISCHARGE INSTRUCTIONS SMOKING Cigarette smoking nearly doubles your risk of having a stroke & is the single most alterable risk factor  If you smoke or have smoked in the last 12 months, you are advised to quit smoking for your health.  Most of the excess cardiovascular risk related to smoking disappears within a year of stopping.  Ask you doctor about anti-smoking medications  [place] Quit Line: 1-800-QUIT NOW  Free Smoking Cessation Classes [phone]  CHOLESTEROL Know your levels; limit fat & cholesterol in your diet  Lipid Panel     Component Value Date/Time   CHOL 134 10/01/2013 0446   TRIG 87 10/01/2013 0446   HDL 65 10/01/2013 0446   CHOLHDL 2.1 10/01/2013 0446   VLDL 17 10/01/2013 0446   LDLCALC 52 10/01/2013 0446      Many patients benefit from treatment even if their cholesterol is at goal.  Goal: Total Cholesterol (CHOL) less than 160  Goal:  Triglycerides (TRIG) less than 150  Goal:  HDL greater than 40  Goal:  LDL (LDLCALC) less than 100   BLOOD PRESSURE American Stroke Association blood pressure target is less that 120/80 mm/Hg  Your discharge blood pressure is:  BP: 123/65 mmHg  Monitor your blood pressure  Limit your salt and alcohol intake  Many individuals will require more than one medication for high blood pressure  DIABETES (A1c is a blood sugar average for last 3 months) Goal HGBA1c is under 7% (HBGA1c is blood sugar average for last 3 months)  Diabetes: No known diagnosis of diabetes    Lab  Results  Component Value Date   HGBA1C 5.7* 10/01/2013     Your HGBA1c can be lowered with medications, healthy diet, and exercise.  Check your blood sugar as directed by your physician  Call your physician if you experience unexplained or low blood sugars.  PHYSICAL ACTIVITY/REHABILITATION Goal is 30 minutes at least 4 days per week  Activity: Walk with assistance, Therapies: Physical Therapy: Home Health Return to work:   Activity decreases  your risk of heart attack and stroke and makes your heart stronger.  It helps control your weight and blood pressure; helps you relax and can improve your mood.  Participate in a regular exercise program.  Talk with your doctor about the best form of exercise for you (dancing, walking, swimming, cycling).  DIET/WEIGHT Goal is to maintain a healthy weight  Your discharge diet is: Cardiac  liquids Your height is:  Height: 5\' 2"  (157.5 cm) Your current weight is: Weight: 71.215 kg (157 lb) Your Body Mass Index (BMI) is:  BMI (Calculated): 27.9  Following the type of diet specifically designed for you will help prevent another stroke.  Your goal weight range is:    Your goal Body Mass Index (BMI) is 19-24.  Healthy food habits can help reduce 3 risk factors for stroke:  High cholesterol, hypertension, and excess weight.  RESOURCES Stroke/Support Group:  Call [phone]   STROKE EDUCATION PROVIDED/REVIEWED AND GIVEN TO PATIENT Stroke warning signs and symptoms How to activate emergency medical system (call 911). Medications prescribed at discharge. Need for follow-up after discharge. Personal risk factors for stroke. Pneumonia vaccine given: No Flu vaccine given: No My questions have been answered, the writing is legible, and I understand these instructions.  I will adhere to these goals & educational materials that have been provided to me after my discharge from the hospital.

## 2013-10-14 NOTE — Discharge Summary (Signed)
NAMJulio Alm:  Lakatos, Petrita              ACCOUNT NO.:  [id number]  MEDICAL RECORD NO.:  [id number]  LOCATION:  4M05C                        FACILITY:  [facility]  PHYSICIAN:  Erick ColaceAndrew E. Kirsteins, M.D.DATE OF BIRTH:  Dec 20, 1930  DATE OF ADMISSION:  10/06/2013 DATE OF DISCHARGE:  10/14/2013                              DISCHARGE SUMMARY   DISCHARGE DIAGNOSES: 1. 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 2. Subcutaneous Lovenox for deep vein thrombosis prophylaxis, pain     management. 3. Anxiety. 4. Left internal carotid artery stenosis. 5. Coronary artery disease. 6. Hypertension. 7. Systolic congestive heart failure. 8. Vitrectomy, September 29, 2013. 9. Stage III coccygeal ulcer. 10.Hyperlipidemia.  HISTORY OF PRESENT ILLNESS:  This is an 80 year old right-handed female, multi-medical, who underwent vitrectomy on September 29, 2013.  On followup in the office next day, developed acute onset of confusion and inability to talk.  CT of the head without changes.  CTA of head and neck with 80% to 90% heavily calcified left internal carotid artery stenosis with potentially flow reduction and no other occlusion.  MRI of the brain done revealed acute left MCA infarct, predominantly involving the temporoparietal region.  Echocardiogram with ejection fraction of 45% and grade 2 diastolic dysfunction.  Dr. Darrick PennaFields of Vascular Surgery consulted, recommendations of stent versus CEA in 4 to 6 weeks for symptomatic carotid stenosis.  Cardiology Services consulted for input on echocardiogram, recommended medical management as the patient without clinical heart failure.  Neurology Service was consulted, maintained on aspirin therapy.  Subcutaneous Lovenox added for DVT prophylaxis.  Wound Care nurse followup on a stage III coccygeal ulcer and calcium alginate was ordered for treatment.  Physical and occupational therapy ongoing. The patient was admitted for comprehensive rehab  program.  PAST MEDICAL HISTORY:  See discharge diagnoses.  SOCIAL HISTORY:  Lives with family.  FUNCTIONAL STATUS:  [facility] was min assist, decreased gait velocity with a very kyphotic posture with verbal cues to stand up right.  PHYSICAL EXAMINATION:  VITAL SIGNS:  Blood pressure 155/45, pulse 64, temperature 98.5, respirations 18. GENERAL:  The patient was alert, oriented x3.  Pupils were round and reactive to light. LUNGS:  Decreased breath sounds.  Clear to auscultation. CARDIAC:  Regular rate and rhythm. ABDOMEN:  Soft, nontender.  Good bowel sounds. NEUROLOGIC:  The patient demonstrated expressive deficits with word finding problems, apraxic, unable to follow two-step commands without max cues.  REHABILITATION HOSPITAL COURSE:  The patient was [facility] to [facility] with therapies initiated on a 3-hour daily basis, consisting of physical therapy, occupational therapy, speech therapy, and rehabilitation nursing.  The following issues were addressed during the patient's rehabilitation stay.  Pertaining to Ms. Ramakrishnan's acute left temporal parietal infarct, remained stable, maintained on aspirin therapy.  Subcutaneous Lovenox ongoing for DVT prophylaxis.  The patient did have a history of anxiety with emotional support provided using Xanax as needed.  Noted during workup of CVA findings, a left ICA stenosis.  The patient to follow up as an outpatient with Dr. Darrick PennaFields on recommendations of CEA in 4 to 6 weeks.  The patient had no chest pain or shortness of breath throughout her rehab course.  Blood  pressures controlled and monitored, remaining on Lopressor as well as Lasix with no signs of fluid overload.  Noted recent vitrectomy on September 29, 2013, would follow up Ophthalmology Services as an outpatient.  The patient remained on Lipitor for hyperlipidemia.  The patient did have a history of restless legs syndrome, remained on Mirapex as  advised. Noted stage III coccygeal ulcer that the patient apparently had sustained in the past from a nursing home placement.  Wound care was cleansed with normal saline, pat gently, lightly coat tissue with protective skin barrier, one quarter piece of calcium alginate dressing, 4x4 with ABD to secure with tape as well as air mattress overlay.  A home health nurse should be arranged for ongoing followup.  The patient received weekly collaborative interdisciplinary team conferences to discuss estimated length of stay, family teaching, and any barriers to discharge.  The patient could propel wheelchair 150 feet with right upper and lower extremity, and minimal assistance.  Overall, min assist for sit to stand with a rolling walker.  The patient required minimum-to- moderate cues to complete sentences secondary to word-finding difficulties.  Activities of daily living training at sink level with emphasis on increasing activity tolerance, they continued to improve throughout rehab course.  Family was home with the patient 24 hours a day.  The patient demonstrating good activity tolerance.  DISCHARGE MEDICATIONS: 1. Xanax 0.25 mg p.o. q.h.s. as needed. 2. Aspirin 325 mg p.o. daily. 3. Lipitor 40 mg p.o. daily. 4. Voltaren gel four times daily to the knee. 5. Cosopt ophthalmic solution one drop, right eye twice daily. 6. Lasix 20 mg p.o. daily. 7. Hydrocodone 1 tablet every 6 hours as needed for moderate pain,     dispense of 60 tablets. 8. Lopressor 50 mg p.o. b.i.d. 9. Multivitamin 1 tablet daily. 10.Protonix 40 mg p.o. daily. 11.MiraLax 17 g daily, hold for loose stool. 12.Mirapex 0.25 mg p.o. t.i.d. as needed as well as 0.5 mg q.h.s. 13.Prednisone forte ophthalmic solution 1%, 1 drop right eye three     times daily.  DIET:  Regular.  SPECIAL INSTRUCTIONS:  The patient should follow up with Dr. Darrick Penna in regard to consideration of carotid surgery in 4 to 6 weeks; follow up with  Ophthalmology Services, call for appointment; follow up with Dr. Claudette Laws, [facility] as [facility]; Dr. Maurice Small, medical management.  Home health nurse arranged for wound care to a stage III pressure ulcer on the coccyx, cleanse with normal saline, pat gently dry.  Lightly coat the periwound tissue with protective skin barrier cream.  Fill defect with one quarter piece of calcium alginate dressing with 4x4, ABD to secure with tape.     Mariam Dollar, P.A.   ______________________________ Erick Colace, M.D.    DA/MEDQ  D:  10/13/2013  T:  10/14/2013  Job:  [id number]  cc:   Donnel Saxon, MD Jake Bathe, MD Pramod P. Pearlean Brownie, MD Gretta Arab Valentina Lucks, M.D.

## 2013-10-18 ENCOUNTER — Encounter (HOSPITAL_BASED_OUTPATIENT_CLINIC_OR_DEPARTMENT_OTHER): Payer: Medicare Other | Attending: General Surgery

## 2013-10-18 DIAGNOSIS — L8994 Pressure ulcer of unspecified site, stage 4: Secondary | ICD-10-CM | POA: Insufficient documentation

## 2013-10-18 DIAGNOSIS — L89109 Pressure ulcer of unspecified part of back, unspecified stage: Secondary | ICD-10-CM | POA: Insufficient documentation

## 2013-10-25 ENCOUNTER — Encounter (HOSPITAL_COMMUNITY): Payer: Medicare Other

## 2013-10-25 ENCOUNTER — Encounter: Payer: Self-pay | Admitting: Cardiology

## 2013-10-27 ENCOUNTER — Ambulatory Visit: Payer: Self-pay | Admitting: Physician Assistant

## 2013-11-02 ENCOUNTER — Encounter: Payer: Self-pay | Admitting: Vascular Surgery

## 2013-11-03 ENCOUNTER — Ambulatory Visit: Payer: Medicare Other | Admitting: Vascular Surgery

## 2013-11-03 ENCOUNTER — Encounter: Payer: Self-pay | Admitting: Vascular Surgery

## 2013-11-03 ENCOUNTER — Other Ambulatory Visit (HOSPITAL_COMMUNITY): Payer: Medicare Other

## 2013-11-03 ENCOUNTER — Ambulatory Visit (INDEPENDENT_AMBULATORY_CARE_PROVIDER_SITE_OTHER): Payer: Medicare Other | Admitting: Vascular Surgery

## 2013-11-03 ENCOUNTER — Ambulatory Visit (HOSPITAL_COMMUNITY)
Admission: RE | Admit: 2013-11-03 | Discharge: 2013-11-03 | Disposition: A | Discharge status: Home / Self Care | Payer: Medicare Other | Source: Ambulatory Visit | Attending: Vascular Surgery | Admitting: Vascular Surgery

## 2013-11-03 ENCOUNTER — Ambulatory Visit (INDEPENDENT_AMBULATORY_CARE_PROVIDER_SITE_OTHER)
Admission: RE | Admit: 2013-11-03 | Discharge: 2013-11-03 | Disposition: A | Discharge status: Home / Self Care | Payer: Medicare Other | Source: Ambulatory Visit | Attending: Vascular Surgery | Admitting: Vascular Surgery

## 2013-11-03 VITALS — BP 179/54 | HR 75 | Ht 62.0 in | Wt 157.0 lb

## 2013-11-03 DIAGNOSIS — I739 Peripheral vascular disease, unspecified: Secondary | ICD-10-CM

## 2013-11-03 DIAGNOSIS — Z8673 Personal history of transient ischemic attack (TIA), and cerebral infarction without residual deficits: Secondary | ICD-10-CM | POA: Insufficient documentation

## 2013-11-03 DIAGNOSIS — I6529 Occlusion and stenosis of unspecified carotid artery: Secondary | ICD-10-CM | POA: Insufficient documentation

## 2013-11-03 DIAGNOSIS — I658 Occlusion and stenosis of other precerebral arteries: Secondary | ICD-10-CM | POA: Insufficient documentation

## 2013-11-03 NOTE — Progress Notes (Signed)
Patient is an 80 year old female seen in followup today for carotid stenosis. Patient had a stroke which primarily affecting her parietal lobe and manifested itself mainly with speech difficulty. Her speech is improved somewhat but she still stroke as to gain words sometimes. She overall is very deconditioned and debilitated due to severe degenerative joint disease. She is unable to ambulate much but she was able to be discharged from a skilled nursing facility to home. 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 She has not had any new neurologic symptoms. She is on aspirin 325 mg once daily. She has multiple chronic medical problems including her degenerative joint disease as well as coronary artery disease congestive failure hypertension hyperlipidemia all of which are currently stable.  Past Medical History  Diagnosis Date  . CHF (congestive heart failure)   . Coronary artery disease   . Hypertension   . Arthritis   . Edema of extremities   . Anxiety   . HLD (hyperlipidemia)   . Stroke   . Peripheral vascular disease    Past Surgical History  Procedure Laterality Date  . Total knee arthroplasty Bilateral     knee replacements  . Lumbar disc surgery    . Cholecystectomy    . Posterior cervical fusion/foraminotomy N/A 12/28/2012    Procedure: POSTERIOR CERVICAL FUSION/FORAMINOTOMY LEVEL 3;  Surgeon: Barnett AbuHenry Elsner, MD;  Location: [facility];  Service: Neurosurgery;  Laterality: N/A;  Posterior Cervical Three-Six Laminectomy and Fusion  . Angioplasty    . Tonsillectomy    . Joint replacement     Current Outpatient Prescriptions on File Prior to Visit  Medication Sig Dispense Refill  . ALPRAZolam (XANAX) 0.25 MG tablet Take 1 tablet every night at bedtime for anxiety  30 tablet  5  . aspirin 325 MG tablet Take 1 tablet (325 mg total) by mouth daily.      . diclofenac sodium (VOLTAREN) 1 % GEL Apply 2 g topically  4 (four) times daily as needed (knee pain).  1 Tube  0  . dorzolamide-timolol (COSOPT) 22.3-6.8 MG/ML ophthalmic solution Place 1 drop into the right eye 2 (two) times daily.  10 mL  12  . furosemide (LASIX) 20 MG tablet Take 1 tablet (20 mg total) by mouth daily.  30 tablet  1  . HYDROcodone-acetaminophen (NORCO/VICODIN) 5-325 MG per tablet Take 1 tablet by mouth every 6 (six) hours as needed for moderate pain.  60 tablet  0  . metoprolol (LOPRESSOR) 50 MG tablet Take 1 tablet (50 mg total) by mouth 2 (two) times daily.  60 tablet  1  . Multiple Vitamin (MULTIVITAMIN WITH MINERALS) TABS Take 1 tablet by mouth daily.      . pramipexole (MIRAPEX) 0.125 MG tablet Take 0.25 mg by mouth at bedtime.      . pramipexole (MIRAPEX) 0.25 MG tablet Take 1 tablet (0.25 mg total) by mouth 3 (three) times daily as needed (restless leg syndrome).      . prednisoLONE acetate (PRED FORTE) 1 % ophthalmic suspension Place 1 drop into the right eye 3 (three) times daily.  5 mL  0  . atorvastatin (LIPITOR) 40 MG tablet Take 1 tablet (40 mg total) by mouth daily at 6 PM.  30 tablet  1  . pantoprazole (PROTONIX) 40 MG tablet Take 1 tablet (40 mg total) by mouth daily.  30 tablet  1   No current facility-administered medications on file  prior to visit.   Allergies  Allergen Reactions  . Penicillins Shortness Of Breath    Throat closed up and had hives and nausea    Review of systems:  Cardiac: Occasional palpitations  Neuro: As above  All other systems negative  Physical exam:  Filed Vitals:   11/03/13 1610 11/03/13 1614  BP: 207/79 179/54  Pulse: 75   Height: 5\' 2"  (1.575 m)   Weight: 157 lb (71.215 kg)   SpO2: 100%     Neck: No carotid bruits  Chest: Clear to auscultation bilaterally  Cardiac: Regular rate and rhythm without murmur  Abdomen: Soft nontender  Extremities: 2+ femoral pulses  Musculoskeletal: Minimal mobility of cervical spine with flexion contracture of approximately 30-45  minimal ability to rotate neck in her deviation of wrists and multiple digits of hands, severe degenerative changes both knees  Data: Patient had a repeat carotid duplex exam today. Vessels were fairly calcified however, the velocities were not as impressive as her previous exam 40-60% stenosis bilaterally but again due to calcification this may be underestimated.  Assessment: Recent stroke with possible high-grade left internal carotid artery stenosis discrepancy between CT scan duplex exam. She is a very poor candidate for carotid endarterectomy.  Plan: Diagnostic carotid angiogram and consideration for carotid stenting. This is scheduled for 11/18/2013. Risks benefits possible complications and procedure details were discussed with the patient and her daughter today. They understand and agree to proceed.  Fabienne Bruns, MD [facility] Office: [phone] Pager: [phone]

## 2013-11-08 ENCOUNTER — Other Ambulatory Visit: Payer: Self-pay | Admitting: *Deleted

## 2013-11-10 ENCOUNTER — Other Ambulatory Visit: Payer: Self-pay | Admitting: *Deleted

## 2013-11-11 ENCOUNTER — Encounter (HOSPITAL_COMMUNITY): Payer: Self-pay | Admitting: Pharmacy Technician

## 2013-11-15 ENCOUNTER — Encounter (HOSPITAL_BASED_OUTPATIENT_CLINIC_OR_DEPARTMENT_OTHER): Payer: Medicare Other | Attending: General Surgery

## 2013-11-15 DIAGNOSIS — L89109 Pressure ulcer of unspecified part of back, unspecified stage: Secondary | ICD-10-CM | POA: Insufficient documentation

## 2013-11-15 DIAGNOSIS — L8994 Pressure ulcer of unspecified site, stage 4: Secondary | ICD-10-CM | POA: Insufficient documentation

## 2013-11-17 ENCOUNTER — Ambulatory Visit: Payer: Medicare Other | Admitting: Physical Medicine and Rehabilitation

## 2013-11-18 ENCOUNTER — Ambulatory Visit (HOSPITAL_COMMUNITY)
Admission: RE | Admit: 2013-11-18 | Discharge: 2013-11-18 | Disposition: A | Discharge status: Home / Self Care | Payer: Medicare Other | Source: Ambulatory Visit | Attending: Vascular Surgery | Admitting: Vascular Surgery

## 2013-11-18 ENCOUNTER — Encounter (HOSPITAL_COMMUNITY)
Admission: RE | Disposition: A | Discharge status: Home / Self Care | Payer: Medicare Other | Source: Ambulatory Visit | Attending: Vascular Surgery

## 2013-11-18 DIAGNOSIS — F411 Generalized anxiety disorder: Secondary | ICD-10-CM | POA: Insufficient documentation

## 2013-11-18 DIAGNOSIS — M1990 Unspecified osteoarthritis, unspecified site: Secondary | ICD-10-CM | POA: Insufficient documentation

## 2013-11-18 DIAGNOSIS — Z96659 Presence of unspecified artificial knee joint: Secondary | ICD-10-CM | POA: Insufficient documentation

## 2013-11-18 DIAGNOSIS — I6529 Occlusion and stenosis of unspecified carotid artery: Secondary | ICD-10-CM

## 2013-11-18 DIAGNOSIS — I708 Atherosclerosis of other arteries: Secondary | ICD-10-CM | POA: Insufficient documentation

## 2013-11-18 DIAGNOSIS — I509 Heart failure, unspecified: Secondary | ICD-10-CM | POA: Insufficient documentation

## 2013-11-18 DIAGNOSIS — Z7982 Long term (current) use of aspirin: Secondary | ICD-10-CM | POA: Insufficient documentation

## 2013-11-18 DIAGNOSIS — I10 Essential (primary) hypertension: Secondary | ICD-10-CM | POA: Insufficient documentation

## 2013-11-18 DIAGNOSIS — I7092 Chronic total occlusion of artery of the extremities: Secondary | ICD-10-CM

## 2013-11-18 DIAGNOSIS — Z8673 Personal history of transient ischemic attack (TIA), and cerebral infarction without residual deficits: Secondary | ICD-10-CM | POA: Insufficient documentation

## 2013-11-18 DIAGNOSIS — I2510 Atherosclerotic heart disease of native coronary artery without angina pectoris: Secondary | ICD-10-CM | POA: Insufficient documentation

## 2013-11-18 DIAGNOSIS — I739 Peripheral vascular disease, unspecified: Secondary | ICD-10-CM | POA: Insufficient documentation

## 2013-11-18 DIAGNOSIS — R5381 Other malaise: Secondary | ICD-10-CM | POA: Insufficient documentation

## 2013-11-18 DIAGNOSIS — E785 Hyperlipidemia, unspecified: Secondary | ICD-10-CM | POA: Insufficient documentation

## 2013-11-18 HISTORY — PX: ARCH AORTOGRAM: SHX5501

## 2013-11-18 HISTORY — PX: ABDOMINAL AORTAGRAM: SHX5454

## 2013-11-18 LAB — POCT I-STAT, CHEM 8
BUN: 34 mg/dL — ABNORMAL HIGH (ref 6–23)
Calcium, Ion: 1.3 mmol/L (ref 1.13–1.30)
Chloride: 105 mEq/L (ref 96–112)
Creatinine, Ser: 0.9 mg/dL (ref 0.50–1.10)
Glucose, Bld: 88 mg/dL (ref 70–99)
HCT: 37 % (ref 36.0–46.0)
HEMOGLOBIN: 12.6 g/dL (ref 12.0–15.0)
Potassium: 4.4 mEq/L (ref 3.7–5.3)
SODIUM: 143 meq/L (ref 137–147)
TCO2: 26 mmol/L (ref 0–100)

## 2013-11-18 SURGERY — ARCH AORTOGRAM

## 2013-11-18 MED ORDER — SODIUM CHLORIDE 0.45 % IV SOLN: INTRAVENOUS | Status: DC

## 2013-11-18 MED ORDER — MORPHINE SULFATE 10 MG/ML IJ SOLN: 2.0000 mg | INTRAMUSCULAR | Status: DC | PRN

## 2013-11-18 MED ORDER — LIDOCAINE HCL (PF) 1 % IJ SOLN
INTRAMUSCULAR | Status: AC
  Filled 2013-11-18: qty 30

## 2013-11-18 MED ORDER — MORPHINE SULFATE 2 MG/ML IJ SOLN
INTRAMUSCULAR | Status: AC
  Administered 2013-11-18: 2 mg via INTRAVENOUS
  Filled 2013-11-18: qty 1

## 2013-11-18 MED ORDER — ACETAMINOPHEN 325 MG RE SUPP
325.0000 mg | RECTAL | Status: DC | PRN
  Filled 2013-11-18: qty 2

## 2013-11-18 MED ORDER — SODIUM CHLORIDE 0.9 % IV SOLN
INTRAVENOUS | Status: DC
  Administered 2013-11-18: 11:00:00 via INTRAVENOUS

## 2013-11-18 MED ORDER — METOPROLOL TARTRATE 1 MG/ML IV SOLN: 2.0000 mg | INTRAVENOUS | Status: DC | PRN

## 2013-11-18 MED ORDER — ACETAMINOPHEN 325 MG PO TABS
325.0000 mg | ORAL_TABLET | ORAL | Status: DC | PRN
  Filled 2013-11-18: qty 2

## 2013-11-18 MED ORDER — FENTANYL CITRATE 0.05 MG/ML IJ SOLN
INTRAMUSCULAR | Status: AC
  Filled 2013-11-18: qty 2

## 2013-11-18 MED ORDER — LABETALOL HCL 5 MG/ML IV SOLN
INTRAVENOUS | Status: AC
  Filled 2013-11-18: qty 4

## 2013-11-18 MED ORDER — ONDANSETRON HCL 4 MG/2ML IJ SOLN: 4.0000 mg | Freq: Four times a day (QID) | INTRAMUSCULAR | Status: DC | PRN

## 2013-11-18 MED ORDER — LABETALOL HCL 5 MG/ML IV SOLN: 10.0000 mg | INTRAVENOUS | Status: DC | PRN

## 2013-11-18 MED ORDER — HEPARIN (PORCINE) IN NACL 2-0.9 UNIT/ML-% IJ SOLN
INTRAMUSCULAR | Status: AC
  Filled 2013-11-18: qty 1000

## 2013-11-18 MED ORDER — HYDRALAZINE HCL 20 MG/ML IJ SOLN: 10.0000 mg | INTRAMUSCULAR | Status: DC | PRN

## 2013-11-18 NOTE — Discharge Instructions (Signed)
Angiography, Care After °Refer to this sheet in the next few weeks. These instructions provide you with information on caring for yourself after your procedure. Your health care provider may also give you more specific instructions. Your treatment has been planned according to current medical practices, but problems sometimes occur. Call your health care provider if you have any problems or questions after your procedure.  °WHAT TO EXPECT AFTER THE PROCEDURE °After your procedure, it is typical to have the following sensations: °· Minor discomfort or tenderness and a small bump at the catheter insertion site. The bump should usually decrease in size and tenderness within 1 to 2 weeks. °· Any bruising will usually fade within 2 to 4 weeks. °HOME CARE INSTRUCTIONS  °· You may need to keep taking blood thinners if they were prescribed for you. Only take over-the-counter or prescription medicines for pain, fever, or discomfort as directed by your health care provider. °· Do not apply powder or lotion to the site. °· Do not sit in a bathtub, swimming pool, or whirlpool for 5 to 7 days. °· You may shower 24 hours after the procedure. Remove the bandage (dressing) and gently wash the site with plain soap and water. Gently pat the site dry. °· Inspect the site at least twice daily. °· Limit your activity for the first 48 hours. Do not bend, squat, or lift anything over 10 lbor as directed by your health care provider. °· Do not drive home if you are discharged the day of the procedure. Have someone else drive you. Follow instructions about when you can drive or return to work. °SEEK MEDICAL CARE IF: °· You get lightheaded when standing up. °· You have drainage (other than a small amount of blood on the dressing). °· You have chills. °· You have a fever. °· You have redness, warmth, swelling, or pain at the insertion site. °SEEK IMMEDIATE MEDICAL CARE IF:  °· You develop chest pain or shortness of breath, feel faint, or pass  out. °· You have bleeding, swelling larger than a walnut, or drainage from the catheter insertion site. °· You develop pain, discoloration, coldness, or severe bruising in the leg or arm that held the catheter. °· You develop bleeding from any other place, such as the bowels. You may see bright red blood in your urine or stools, or your stools may appear black and tarry. °· You have heavy bleeding from the site. If this happens, hold pressure on the site. °MAKE SURE YOU: °· Understand these instructions. °· Will watch your condition. °· Will get help right away if you are not doing well or get worse. °Document Released: 03/20/2005 Document Revised: 05/04/2013 Document Reviewed: 01/24/2013 °[employer]® Patient Information ©2014 [employer]. ° °

## 2013-11-18 NOTE — Interval H&P Note (Signed)
History and Physical Interval Note:  11/18/2013 10:22 AM  [name]  has presented today for surgery, with the diagnosis of carotid stenosis  The various methods of treatment have been discussed with the patient and family. After consideration of risks, benefits and other options for treatment, the patient has consented to  Procedure(s): CAROTID ANGIOGRAM (N/A) as a surgical intervention .  The patient's history has been reviewed, patient examined, no change in status, stable for surgery.  I have reviewed the patient's chart and labs.  Questions were answered to the patient's satisfaction.     FIELDS,CHARLES E

## 2013-11-18 NOTE — H&P (View-Only) (Signed)
Patient is an 80-year-old female seen in followup today for carotid stenosis. Patient had a stroke which primarily affecting her parietal lobe and manifested itself mainly with speech difficulty. Her speech is improved somewhat but she still stroke as to gain words sometimes. She overall is very deconditioned and debilitated due to severe degenerative joint disease. She is unable to ambulate much but she was able to be discharged from a skilled nursing facility to home. 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 She has not had any new neurologic symptoms. She is on aspirin 325 mg once daily. She has multiple chronic medical problems including her degenerative joint disease as well as coronary artery disease congestive failure hypertension hyperlipidemia all of which are currently stable.  Past Medical History  Diagnosis Date  . CHF (congestive heart failure)   . Coronary artery disease   . Hypertension   . Arthritis   . Edema of extremities   . Anxiety   . HLD (hyperlipidemia)   . Stroke   . Peripheral vascular disease    Past Surgical History  Procedure Laterality Date  . Total knee arthroplasty Bilateral     knee replacements  . Lumbar disc surgery    . Cholecystectomy    . Posterior cervical fusion/foraminotomy N/A 12/28/2012    Procedure: POSTERIOR CERVICAL FUSION/FORAMINOTOMY LEVEL 3;  Surgeon: Henry Elsner, MD;  Location: [facility];  Service: Neurosurgery;  Laterality: N/A;  Posterior Cervical Three-Six Laminectomy and Fusion  . Angioplasty    . Tonsillectomy    . Joint replacement     Current Outpatient Prescriptions on File Prior to Visit  Medication Sig Dispense Refill  . ALPRAZolam (XANAX) 0.25 MG tablet Take 1 tablet every night at bedtime for anxiety  30 tablet  5  . aspirin 325 MG tablet Take 1 tablet (325 mg total) by mouth daily.      . diclofenac sodium (VOLTAREN) 1 % GEL Apply 2 g topically  4 (four) times daily as needed (knee pain).  1 Tube  0  . dorzolamide-timolol (COSOPT) 22.3-6.8 MG/ML ophthalmic solution Place 1 drop into the right eye 2 (two) times daily.  10 mL  12  . furosemide (LASIX) 20 MG tablet Take 1 tablet (20 mg total) by mouth daily.  30 tablet  1  . HYDROcodone-acetaminophen (NORCO/VICODIN) 5-325 MG per tablet Take 1 tablet by mouth every 6 (six) hours as needed for moderate pain.  60 tablet  0  . metoprolol (LOPRESSOR) 50 MG tablet Take 1 tablet (50 mg total) by mouth 2 (two) times daily.  60 tablet  1  . Multiple Vitamin (MULTIVITAMIN WITH MINERALS) TABS Take 1 tablet by mouth daily.      . pramipexole (MIRAPEX) 0.125 MG tablet Take 0.25 mg by mouth at bedtime.      . pramipexole (MIRAPEX) 0.25 MG tablet Take 1 tablet (0.25 mg total) by mouth 3 (three) times daily as needed (restless leg syndrome).      . prednisoLONE acetate (PRED FORTE) 1 % ophthalmic suspension Place 1 drop into the right eye 3 (three) times daily.  5 mL  0  . atorvastatin (LIPITOR) 40 MG tablet Take 1 tablet (40 mg total) by mouth daily at 6 PM.  30 tablet  1  . pantoprazole (PROTONIX) 40 MG tablet Take 1 tablet (40 mg total) by mouth daily.  30 tablet  1   No current facility-administered medications on file   prior to visit.   Allergies  Allergen Reactions  . Penicillins Shortness Of Breath    Throat closed up and had hives and nausea    Review of systems:  Cardiac: Occasional palpitations  Neuro: As above  All other systems negative  Physical exam:  Filed Vitals:   11/03/13 1610 11/03/13 1614  BP: 207/79 179/54  Pulse: 75   Height: 5' 2" (1.575 m)   Weight: 157 lb (71.215 kg)   SpO2: 100%     Neck: No carotid bruits  Chest: Clear to auscultation bilaterally  Cardiac: Regular rate and rhythm without murmur  Abdomen: Soft nontender  Extremities: 2+ femoral pulses  Musculoskeletal: Minimal mobility of cervical spine with flexion contracture of approximately 30-45  minimal ability to rotate neck in her deviation of wrists and multiple digits of hands, severe degenerative changes both knees  Data: Patient had a repeat carotid duplex exam today. Vessels were fairly calcified however, the velocities were not as impressive as her previous exam 40-60% stenosis bilaterally but again due to calcification this may be underestimated.  Assessment: Recent stroke with possible high-grade left internal carotid artery stenosis discrepancy between CT scan duplex exam. She is a very poor candidate for carotid endarterectomy.  Plan: Diagnostic carotid angiogram and consideration for carotid stenting. This is scheduled for 11/18/2013. Risks benefits possible complications and procedure details were discussed with the patient and her daughter today. They understand and agree to proceed.  Ani Deoliveira, MD [facility] Office: [phone] Pager: [phone]  

## 2013-11-18 NOTE — Op Note (Signed)
Procedure: Arch aortogram  Preoperative diagnosis: Symptomatic carotid stenosis  Postoperative diagnosis: Same  Anesthesia: Local with sedation  Indications: Patient is an 80 year old female with recent left brain stroke and high grade left internal carotid artery stenosis by CT Angio. She presents today for diagnostic arteriogram for consideration of carotid stenting.  Operative details: After obtaining informed consent, the [facility] The patient was placed in supine position the Angio table. Both groins were prepped and draped in usual sterile fashion. Local anesthesia was infiltrated over the right common femoral artery. Ultrasound was used to identify the right common femoral artery. An introducer needle was used to cannulate the right common femoral artery. An 27035 Vesacore wire was threaded into the right pelvis but would not advance into the abdominal aorta. A 5 French sheath was placed over the guidewire into the right iliac system. A 5 [place] KMP catheter was then placed in the right iliac system to give extra catheter support the guidewire still would not advance. Contrast angiogram was performed. This showed a flush occlusion of the right common iliac artery. The right external and internal iliac arteries were patent. At this point attempts to proceed from the right groin were abandoned. The 5 French sheath was thoroughly flushed with heparinized saline. Attention was then turned to the left groin. Local anesthesia was infiltrated over the left common femoral artery. Ultrasound was used to identify the left common femoral artery. The left common femoral artery was cannulated with an introducer needle. An 035 first core wire was threaded into the left common femoral artery and up in the left pelvis. This also would not advance initially into the abdominal aorta. A 5 French sheath was placed over the guidewire the left femoral artery. This was thoroughly flushed with  heparinized saline. A 5 [place] KMP catheter was placed over the guidewire into the left iliac system.  Contrast angiogram was performed which showed dense calcification in the left iliac system but patency of the left common iliac artery. An 035 angled Glidewire was brought up in the operative field and this was advanced with some manipulation up in the abdominal aorta. A 5 French pigtail catheter was placed over the guidewire and abdominal aortogram was obtained. Left and right renal arteries are patent. There is diffuse calcific atherosclerosis of the infrarenal abdominal aorta. The right common iliac artery is occluded at its origin. The right iliac bifurcation reconstitutes via pelvic collaterals. The left common iliac artery is calcified as well but patent. There is a shelflike stenosis in its proximal to midportion. At this point I was able to advance the pigtail catheter up into the ascending aorta. An arch aortogram was obtained in a 60 LAO position. This shows a type III aortic arch. There is a densely calcified plaque in the left carotid bifurcation extending over approximately 3 cm. This is produces a 90% stenosis of the left internal carotid artery. The right carotid bifurcation is not well-visualized but previous CT angiogram had suggested a 50% calcified stenosis. The innominate bifurcation is also heavily calcified with some narrowing of the proximal subclavian artery. The innominate artery is patent proximally. Aortic arch is heavily calcified. The origin of the left common and left subclavian arteries are patent. An additional attempt was made with an arch aortogram to lay out the bifurcation of the right carotid but again this was not well visualized. At this point I decided that the patient was not going to be a candidate for carotid stenting  due to the dense calcification of the left internal carotid artery plus the fact that making access from her type III arch would be quite difficult. At this  point the pigtail catheter was removed over a guidewire. Each of the 5 French sheath was removed and hemostasis obtained with direct pressure. The patient tolerated procedure well and there were no complications. The patient was taken to the holding area in stable condition.  Operative findings: #1 type III aortic arch                                 #2 no high-grade stenosis of the right internal carotid artery                                 #3 90% stenosis of the left internal carotid artery with dense calcification in amenable to stenting                                  #4 occlusion right common iliac artery  Operative management: The patient's left internal carotid artery lesion is 90% but is too calcified to be considered for carotid stenting as well as her type III aortic arch. The patient is not a candidate for open carotid endarterectomy due to her overall poor physical condition. The patient's carotid stenosis will be managed medically.  Fabienne Bruns, MD [facility] Office: [phone] Pager: [phone]

## 2013-11-19 ENCOUNTER — Other Ambulatory Visit (HOSPITAL_COMMUNITY): Payer: Self-pay | Admitting: Family Medicine

## 2013-11-28 ENCOUNTER — Telehealth: Payer: Self-pay | Admitting: Vascular Surgery

## 2013-11-28 NOTE — Telephone Encounter (Addendum)
Message copied by Fredrich BirksMILLIKAN, DANA P on Mon Nov 28, 2013  2:52 PM ------      Message from: [place]      Created: Fri Nov 25, 2013  4:53 PM      Regarding: RE: ? regarding follow up       No according to [facility] charge note patient would not be having any further surgery or followup.             ----- Message -----         From: Fredrich Birksana P [place]         Sent: 11/25/2013  10:38 AM           To: Sharee PimpleMarilyn K McChesney, CMA      Subject: ? regarding follow up                                                Ms Vollrath had a carotid angio on 11/18/13- should she have a follow up?            Thanks,      Annabelle Harmanana       ------

## 2013-12-08 ENCOUNTER — Other Ambulatory Visit (HOSPITAL_COMMUNITY): Payer: Self-pay

## 2013-12-08 ENCOUNTER — Ambulatory Visit: Payer: Self-pay | Admitting: Vascular Surgery

## 2013-12-08 ENCOUNTER — Other Ambulatory Visit (HOSPITAL_COMMUNITY): Payer: Medicare Other

## 2013-12-21 ENCOUNTER — Emergency Department (HOSPITAL_COMMUNITY)
Admission: EM | Admit: 2013-12-21 | Discharge: 2013-12-21 | Disposition: A | Discharge status: Home / Self Care | Payer: Medicare Other | Attending: Emergency Medicine | Admitting: Emergency Medicine

## 2013-12-21 ENCOUNTER — Encounter (HOSPITAL_COMMUNITY): Payer: Self-pay | Admitting: Emergency Medicine

## 2013-12-21 ENCOUNTER — Emergency Department (HOSPITAL_COMMUNITY): Payer: Medicare Other

## 2013-12-21 DIAGNOSIS — M129 Arthropathy, unspecified: Secondary | ICD-10-CM | POA: Insufficient documentation

## 2013-12-21 DIAGNOSIS — H02409 Unspecified ptosis of unspecified eyelid: Secondary | ICD-10-CM | POA: Insufficient documentation

## 2013-12-21 DIAGNOSIS — R202 Paresthesia of skin: Secondary | ICD-10-CM

## 2013-12-21 DIAGNOSIS — Z7982 Long term (current) use of aspirin: Secondary | ICD-10-CM | POA: Insufficient documentation

## 2013-12-21 DIAGNOSIS — E785 Hyperlipidemia, unspecified: Secondary | ICD-10-CM | POA: Insufficient documentation

## 2013-12-21 DIAGNOSIS — Z79899 Other long term (current) drug therapy: Secondary | ICD-10-CM | POA: Insufficient documentation

## 2013-12-21 DIAGNOSIS — F411 Generalized anxiety disorder: Secondary | ICD-10-CM | POA: Insufficient documentation

## 2013-12-21 DIAGNOSIS — IMO0002 Reserved for concepts with insufficient information to code with codable children: Secondary | ICD-10-CM | POA: Insufficient documentation

## 2013-12-21 DIAGNOSIS — R209 Unspecified disturbances of skin sensation: Secondary | ICD-10-CM | POA: Insufficient documentation

## 2013-12-21 DIAGNOSIS — H11429 Conjunctival edema, unspecified eye: Secondary | ICD-10-CM | POA: Insufficient documentation

## 2013-12-21 DIAGNOSIS — Z9861 Coronary angioplasty status: Secondary | ICD-10-CM | POA: Insufficient documentation

## 2013-12-21 DIAGNOSIS — Z8673 Personal history of transient ischemic attack (TIA), and cerebral infarction without residual deficits: Secondary | ICD-10-CM | POA: Insufficient documentation

## 2013-12-21 DIAGNOSIS — Z880 Allergy status to penicillin: Secondary | ICD-10-CM | POA: Insufficient documentation

## 2013-12-21 DIAGNOSIS — I509 Heart failure, unspecified: Secondary | ICD-10-CM | POA: Insufficient documentation

## 2013-12-21 DIAGNOSIS — I2510 Atherosclerotic heart disease of native coronary artery without angina pectoris: Secondary | ICD-10-CM | POA: Insufficient documentation

## 2013-12-21 DIAGNOSIS — R200 Anesthesia of skin: Secondary | ICD-10-CM

## 2013-12-21 DIAGNOSIS — I10 Essential (primary) hypertension: Secondary | ICD-10-CM | POA: Insufficient documentation

## 2013-12-21 DIAGNOSIS — J3489 Other specified disorders of nose and nasal sinuses: Secondary | ICD-10-CM | POA: Insufficient documentation

## 2013-12-21 LAB — CBC
HCT: 33.8 % — ABNORMAL LOW (ref 36.0–46.0)
HEMOGLOBIN: 11 g/dL — AB (ref 12.0–15.0)
MCH: 30.6 pg (ref 26.0–34.0)
MCHC: 32.5 g/dL (ref 30.0–36.0)
MCV: 94.2 fL (ref 78.0–100.0)
Platelets: 219 10*3/uL (ref 150–400)
RBC: 3.59 MIL/uL — ABNORMAL LOW (ref 3.87–5.11)
RDW: 15.5 % (ref 11.5–15.5)
WBC: 6.8 10*3/uL (ref 4.0–10.5)

## 2013-12-21 LAB — URINALYSIS, ROUTINE W REFLEX MICROSCOPIC
Bilirubin Urine: NEGATIVE
Glucose, UA: NEGATIVE mg/dL
Hgb urine dipstick: NEGATIVE
Ketones, ur: NEGATIVE mg/dL
Nitrite: NEGATIVE
PH: 7.5 (ref 5.0–8.0)
Protein, ur: NEGATIVE mg/dL
SPECIFIC GRAVITY, URINE: 1.01 (ref 1.005–1.030)
UROBILINOGEN UA: 0.2 mg/dL (ref 0.0–1.0)

## 2013-12-21 LAB — URINE MICROSCOPIC-ADD ON

## 2013-12-21 LAB — BASIC METABOLIC PANEL
BUN: 29 mg/dL — ABNORMAL HIGH (ref 6–23)
CO2: 27 meq/L (ref 19–32)
Calcium: 9.7 mg/dL (ref 8.4–10.5)
Chloride: 102 mEq/L (ref 96–112)
Creatinine, Ser: 0.82 mg/dL (ref 0.50–1.10)
GFR calc Af Amer: 76 mL/min — ABNORMAL LOW (ref >90)
GFR calc non Af Amer: 66 mL/min — ABNORMAL LOW (ref >90)
GLUCOSE: 99 mg/dL (ref 70–99)
Potassium: 4.3 mEq/L (ref 3.7–5.3)
SODIUM: 141 meq/L (ref 137–147)

## 2013-12-21 LAB — I-STAT TROPONIN, ED: TROPONIN I, POC: 0.01 ng/mL (ref 0.00–0.08)

## 2013-12-21 NOTE — ED Notes (Signed)
Pt returned from c-t alert family at the bedside

## 2013-12-21 NOTE — ED Provider Notes (Signed)
CSN: [id number]     Arrival date & time 12/21/13  1142 History   First MD Initiated Contact with Patient 12/21/13 1211     Chief Complaint  Patient presents with  . Numbness  . Shortness of Breath     (Consider location/radiation/quality/duration/timing/severity/associated sxs/prior Treatment) HPI Comments: 80 year old female presenting a few hours after experiencing bilateral facial numbness. Symptoms initially resolved, but returned shortly thereafter. She then started having tingling and now feels that her face is "puffy". At that time, she nervous and stated that she had a hard time breathing. She now has no difficulty breathing. She denies weakness, dysphasia, headache, or other significant symptoms. Her daughter thinks her eyes look puffy.   Past Medical History  Diagnosis Date  . CHF (congestive heart failure)   . Coronary artery disease   . Hypertension   . Arthritis   . Edema of extremities   . Anxiety   . HLD (hyperlipidemia)   . Stroke   . Peripheral vascular disease    Past Surgical History  Procedure Laterality Date  . Total knee arthroplasty Bilateral     knee replacements  . Lumbar disc surgery    . Cholecystectomy    . Posterior cervical fusion/foraminotomy N/A 12/28/2012    Procedure: POSTERIOR CERVICAL FUSION/FORAMINOTOMY LEVEL 3;  Surgeon: Barnett Abu, MD;  Location: [facility];  Service: Neurosurgery;  Laterality: N/A;  Posterior Cervical Three-Six Laminectomy and Fusion  . Angioplasty    . Tonsillectomy    . Joint replacement     Family History  Problem Relation Age of Onset  . Dementia Sister     x 3  . CAD Brother   . Breast cancer Sister     x 3  . Heart disease Sister   . Heart disease Son     x 2  . Cancer Son   . Hyperlipidemia Son   . Hypertension Son   . Alzheimer's disease Sister   . Depression Daughter   . Hypertension Daughter   . Hyperlipidemia Daughter    History  Substance Use Topics  . Smoking status: Never Smoker   .  Smokeless tobacco: Never Used  . Alcohol Use: Yes     Comment: rarely   OB History   Grav Para Term Preterm Abortions TAB SAB Ect Mult Living                 Review of Systems  Constitutional: Negative for fever.  HENT: Positive for congestion (mild\).   Respiratory: Positive for shortness of breath. Negative for cough.   Cardiovascular: Negative for chest pain.  Gastrointestinal: Negative for nausea, vomiting, abdominal pain and diarrhea.  All other systems reviewed and are negative.     Allergies  Penicillins  Home Medications   Current Outpatient Rx  Name  Route  Sig  Dispense  Refill  . ALPRAZolam (XANAX) 0.25 MG tablet   Oral   Take 0.25 mg by mouth at bedtime as needed for anxiety.         Marland Kitchen aspirin 325 MG tablet   Oral   Take 1 tablet (325 mg total) by mouth daily.         . calcium carbonate (OS-CAL) 600 MG TABS tablet   Oral   Take 600 mg by mouth 2 (two) times daily with a meal.         . diclofenac sodium (VOLTAREN) 1 % GEL   Topical   Apply 2 g topically 4 (four) times daily  as needed (knee pain).   1 Tube   0   . dorzolamide-timolol (COSOPT) 22.3-6.8 MG/ML ophthalmic solution   Right Eye   Place 1 drop into the right eye 2 (two) times daily.   10 mL   12   . furosemide (LASIX) 20 MG tablet   Oral   Take 1 tablet (20 mg total) by mouth daily.   30 tablet   1   . HYDROcodone-acetaminophen (NORCO/VICODIN) 5-325 MG per tablet   Oral   Take 1 tablet by mouth every 6 (six) hours as needed for moderate pain.   60 tablet   0   . methocarbamol (ROBAXIN) 500 MG tablet   Oral   Take 500 mg by mouth every 6 (six) hours as needed for muscle spasms.         . metoprolol (LOPRESSOR) 50 MG tablet   Oral   Take 1 tablet (50 mg total) by mouth 2 (two) times daily.   60 tablet   1   . Multiple Vitamin (MULTIVITAMIN WITH MINERALS) TABS tablet   Oral   Take 1 tablet by mouth daily.         . Multiple Vitamin (MULTIVITAMIN WITH MINERALS)  TABS   Oral   Take 1 tablet by mouth daily.         . naproxen sodium (ANAPROX) 220 MG tablet   Oral   Take 220 mg by mouth 2 (two) times daily as needed (pain).          . pramipexole (MIRAPEX) 0.5 MG tablet   Oral   Take 0.5 mg by mouth at bedtime.         . pravastatin (PRAVACHOL) 80 MG tablet   Oral   Take 80 mg by mouth daily.          . prednisoLONE acetate (PRED FORTE) 1 % ophthalmic suspension   Right Eye   Place 1 drop into the right eye 3 (three) times daily.   5 mL   0    BP 175/95  Pulse 58  Temp(Src) 98.9 F (37.2 C)  Resp 18  SpO2 100% Physical Exam  Nursing note and vitals reviewed. Constitutional: She is oriented to person, place, and time. She appears well-developed and well-nourished. No distress.  HENT:  Head: Normocephalic and atraumatic.  Mouth/Throat: Oropharynx is clear and moist.  Eyes: EOM are normal. Pupils are equal, round, and reactive to light. Right eye exhibits chemosis and discharge. Left eye exhibits chemosis and discharge. Right conjunctiva is injected. Left conjunctiva is not injected. No scleral icterus.  Left-sided ptosis - described as old  Neck: Neck supple.  Cardiovascular: Normal rate, regular rhythm, normal heart sounds and intact distal pulses.   No murmur heard. Pulmonary/Chest: Effort normal and breath sounds normal. No stridor. No respiratory distress. She has no rales.  Abdominal: Soft. Bowel sounds are normal. She exhibits no distension. There is no tenderness.  Musculoskeletal: Normal range of motion.  Neurological: She is alert and oriented to person, place, and time. She has normal strength. No cranial nerve deficit or sensory deficit. Coordination normal. GCS eye subscore is 4. GCS verbal subscore is 5. GCS motor subscore is 6.  Skin: Skin is warm and dry. No rash noted.  Psychiatric: She has a normal mood and affect. Her behavior is normal.    ED Course  Procedures (including critical care time) Labs  Review Labs Reviewed  CBC - Abnormal; Notable for the following:    RBC 3.59 (*)  Hemoglobin 11.0 (*)    HCT 33.8 (*)    All other components within normal limits  BASIC METABOLIC PANEL - Abnormal; Notable for the following:    BUN 29 (*)    GFR calc non Af Amer 66 (*)    GFR calc Af Amer 76 (*)    All other components within normal limits  URINALYSIS, ROUTINE W REFLEX MICROSCOPIC - Abnormal; Notable for the following:    Leukocytes, UA SMALL (*)    All other components within normal limits  URINE MICROSCOPIC-ADD ON - Abnormal; Notable for the following:    Squamous Epithelial / LPF FEW (*)    All other components within normal limits  I-STAT TROPOININ, ED   Imaging Review Ct Head Wo Contrast  12/21/2013   CLINICAL DATA:  Facial numbness  EXAM: CT HEAD WITHOUT CONTRAST  TECHNIQUE: Contiguous axial images were obtained from the base of the skull through the vertex without intravenous contrast.  COMPARISON:  Prior MRI from 10/01/2013  FINDINGS: Encephalomalacia now seen within left temporoparietal region in area of previously identified left MCA territory infarct seen on prior MRI from 10/01/2013. Age-appropriate volume loss present. There is mild chronic small vessel disease involving the supratentorial white matter.  There is no acute intracranial hemorrhage or infarct. No mass lesion or midline shift. Gray-white matter differentiation is well maintained. Ventricles are normal in size without evidence of hydrocephalus. CSF containing spaces are within normal limits. No extra-axial fluid collection.  The calvarium is intact.  Orbital soft tissues are within normal limits. Postsurgical changes noted about the long right globe.  The paranasal sinuses and mastoid air cells are well pneumatized and free of fluid.  Scalp soft tissues are unremarkable.  IMPRESSION: 1. No acute intracranial process identified. 2. Remote left MCA territory infarct. 3. Mild generalized cerebral atrophy with chronic  microvascular ischemic changes.   Electronically Signed   By: Rise Mu M.D.   On: 12/21/2013 15:39  All radiology studies independently viewed by me.      EKG Interpretation   Date/Time:  Wednesday December 21 2013 14:11:48 EDT Ventricular Rate:  60 PR Interval:  198 QRS Duration: 92 QT Interval:  439 QTC Calculation: 439 R Axis:   27 Text Interpretation:  Sinus rhythm Abnormal R-wave progression, early  transition No significant change was found Confirmed by [facility]  MD, TREY  ([id number]) on 12/21/2013 3:35:46 PM      MDM   Final diagnoses:  Numbness or tingling    80 year old female presenting after an episode of bilateral facial numbness. Her symptoms and her neurologic exam are not consistent with TIA or stroke. She has some conjunctival irritation and matting in complains of some nasal congestion. Her symptoms might be from seasonal allergies. She does not appear to be having a systemic allergic reaction.  Her ED workup was unremarkable.  She remained well appearing and stated she felt well.  Plan DC home with PCP follow up.    Candyce Churn III, MD 12/21/13 [phone]

## 2013-12-21 NOTE — ED Notes (Signed)
Dr. Wofford at bedside 

## 2013-12-21 NOTE — ED Notes (Addendum)
Per pt sts since about 10 am she has been having numbness all over her face and both arms. Denies weakness anywhere. No facial droop grips equal. sts also some SOB. Pt speaking in complete sentences and no distress at triage. Pt had a stroke in January and has speech problems from that stroke. Pt also has been having neck pain and neck surgery about 1 year ago. Pt sts that she thinks when her face became numb that it made her nervous.

## 2013-12-21 NOTE — Discharge Instructions (Signed)
Paresthesia °Paresthesia is an abnormal burning or prickling sensation. This sensation is generally felt in the hands, arms, legs, or feet. However, it may occur in any part of the body. It is usually not painful. The feeling may be described as: °· Tingling or numbness. °· "Pins and needles." °· Skin crawling. °· Buzzing. °· Limbs "falling asleep." °· Itching. °Most people experience temporary (transient) paresthesia at some time in their lives. °CAUSES  °Paresthesia may occur when you breathe too quickly (hyperventilation). It can also occur without any apparent cause. Commonly, paresthesia occurs when pressure is placed on a nerve. The feeling quickly goes away once the pressure is removed. For some people, however, paresthesia is a long-lasting (chronic) condition caused by an underlying disorder. The underlying disorder may be: °· A traumatic, direct injury to nerves. Examples include a: °· Broken (fractured) neck. °· Fractured skull. °· A disorder affecting the brain and spinal cord (central nervous system). Examples include: °· Transverse myelitis. °· Encephalitis. °· Transient ischemic attack. °· Multiple sclerosis. °· Stroke. °· Tumor or blood vessel problems, such as an arteriovenous malformation pressing against the brain or spinal cord. °· A condition that damages the peripheral nerves (peripheral neuropathy). Peripheral nerves are not part of the brain and spinal cord. These conditions include: °· Diabetes. °· Peripheral vascular disease. °· Nerve entrapment syndromes, such as carpal tunnel syndrome. °· Shingles. °· Hypothyroidism. °· Vitamin B12 deficiencies. °· Alcoholism. °· Heavy metal poisoning (lead, arsenic). °· Rheumatoid arthritis. °· Systemic lupus erythematosus. °DIAGNOSIS  °Your caregiver will attempt to find the underlying cause of your paresthesia. Your caregiver may: °· Take your medical history. °· Perform a physical exam. °· Order various lab tests. °· Order imaging tests. °TREATMENT    °Treatment for paresthesia depends on the underlying cause. °HOME CARE INSTRUCTIONS °· Avoid drinking alcohol. °· You may consider massage or acupuncture to help relieve your symptoms. °· Keep all follow-up appointments as directed by your caregiver. °SEEK IMMEDIATE MEDICAL CARE IF:  °· You feel weak. °· You have trouble walking or moving. °· You have problems with speech or vision. °· You feel confused. °· You cannot control your bladder or bowel movements. °· You feel numbness after an injury. °· You faint. °· Your burning or prickling feeling gets worse when walking. °· You have pain, cramps, or dizziness. °· You develop a rash. °MAKE SURE YOU: °· Understand these instructions. °· Will watch your condition. °· Will get help right away if you are not doing well or get worse. °Document Released: 08/22/2002 Document Revised: 11/24/2011 Document Reviewed: 05/23/2011 °[employer]® Patient Information ©2014 [employer]. ° °

## 2014-01-10 ENCOUNTER — Encounter (HOSPITAL_BASED_OUTPATIENT_CLINIC_OR_DEPARTMENT_OTHER): Payer: Medicare Other | Attending: General Surgery

## 2014-01-10 DIAGNOSIS — L8994 Pressure ulcer of unspecified site, stage 4: Secondary | ICD-10-CM | POA: Insufficient documentation

## 2014-01-10 DIAGNOSIS — L89109 Pressure ulcer of unspecified part of back, unspecified stage: Secondary | ICD-10-CM | POA: Insufficient documentation

## 2014-02-07 ENCOUNTER — Encounter (HOSPITAL_BASED_OUTPATIENT_CLINIC_OR_DEPARTMENT_OTHER): Payer: Medicare Other | Attending: General Surgery

## 2014-02-25 ENCOUNTER — Encounter (HOSPITAL_COMMUNITY): Payer: Self-pay | Admitting: Emergency Medicine

## 2014-02-25 ENCOUNTER — Emergency Department (HOSPITAL_COMMUNITY)
Admission: EM | Admit: 2014-02-25 | Discharge: 2014-02-25 | Disposition: A | Discharge status: Home / Self Care | Payer: Medicare Other | Attending: Emergency Medicine | Admitting: Emergency Medicine

## 2014-02-25 ENCOUNTER — Emergency Department (HOSPITAL_COMMUNITY): Payer: Medicare Other

## 2014-02-25 DIAGNOSIS — M79609 Pain in unspecified limb: Secondary | ICD-10-CM | POA: Insufficient documentation

## 2014-02-25 DIAGNOSIS — Z7982 Long term (current) use of aspirin: Secondary | ICD-10-CM | POA: Diagnosis not present

## 2014-02-25 DIAGNOSIS — Z8673 Personal history of transient ischemic attack (TIA), and cerebral infarction without residual deficits: Secondary | ICD-10-CM | POA: Diagnosis not present

## 2014-02-25 DIAGNOSIS — M542 Cervicalgia: Secondary | ICD-10-CM | POA: Diagnosis not present

## 2014-02-25 DIAGNOSIS — Z87828 Personal history of other (healed) physical injury and trauma: Secondary | ICD-10-CM | POA: Diagnosis not present

## 2014-02-25 DIAGNOSIS — I10 Essential (primary) hypertension: Secondary | ICD-10-CM | POA: Diagnosis not present

## 2014-02-25 DIAGNOSIS — I2510 Atherosclerotic heart disease of native coronary artery without angina pectoris: Secondary | ICD-10-CM | POA: Insufficient documentation

## 2014-02-25 DIAGNOSIS — R51 Headache: Secondary | ICD-10-CM | POA: Diagnosis present

## 2014-02-25 DIAGNOSIS — F411 Generalized anxiety disorder: Secondary | ICD-10-CM | POA: Insufficient documentation

## 2014-02-25 DIAGNOSIS — Z8781 Personal history of (healed) traumatic fracture: Secondary | ICD-10-CM | POA: Diagnosis not present

## 2014-02-25 DIAGNOSIS — E785 Hyperlipidemia, unspecified: Secondary | ICD-10-CM | POA: Insufficient documentation

## 2014-02-25 DIAGNOSIS — Z880 Allergy status to penicillin: Secondary | ICD-10-CM | POA: Diagnosis not present

## 2014-02-25 DIAGNOSIS — I509 Heart failure, unspecified: Secondary | ICD-10-CM | POA: Insufficient documentation

## 2014-02-25 DIAGNOSIS — Z9861 Coronary angioplasty status: Secondary | ICD-10-CM | POA: Diagnosis not present

## 2014-02-25 DIAGNOSIS — Z79899 Other long term (current) drug therapy: Secondary | ICD-10-CM | POA: Diagnosis not present

## 2014-02-25 DIAGNOSIS — M129 Arthropathy, unspecified: Secondary | ICD-10-CM | POA: Insufficient documentation

## 2014-02-25 DIAGNOSIS — R519 Headache, unspecified: Secondary | ICD-10-CM

## 2014-02-25 LAB — BASIC METABOLIC PANEL
BUN: 23 mg/dL (ref 6–23)
CO2: 25 mEq/L (ref 19–32)
Calcium: 9.9 mg/dL (ref 8.4–10.5)
Chloride: 101 mEq/L (ref 96–112)
Creatinine, Ser: 0.81 mg/dL (ref 0.50–1.10)
GFR calc Af Amer: 77 mL/min — ABNORMAL LOW (ref >90)
GFR calc non Af Amer: 67 mL/min — ABNORMAL LOW (ref >90)
Glucose, Bld: 91 mg/dL (ref 70–99)
Potassium: 4.4 mEq/L (ref 3.7–5.3)
Sodium: 140 mEq/L (ref 137–147)

## 2014-02-25 LAB — CBC WITH DIFFERENTIAL/PLATELET
Basophils Absolute: 0.1 10*3/uL (ref 0.0–0.1)
Basophils Relative: 1 % (ref 0–1)
Eosinophils Absolute: 0.3 10*3/uL (ref 0.0–0.7)
Eosinophils Relative: 3 % (ref 0–5)
HCT: 33.5 % — ABNORMAL LOW (ref 36.0–46.0)
Hemoglobin: 11 g/dL — ABNORMAL LOW (ref 12.0–15.0)
Lymphocytes Relative: 27 % (ref 12–46)
Lymphs Abs: 2.8 10*3/uL (ref 0.7–4.0)
MCH: 30.7 pg (ref 26.0–34.0)
MCHC: 32.8 g/dL (ref 30.0–36.0)
MCV: 93.6 fL (ref 78.0–100.0)
Monocytes Absolute: 0.6 10*3/uL (ref 0.1–1.0)
Monocytes Relative: 6 % (ref 3–12)
Neutro Abs: 6.6 10*3/uL (ref 1.7–7.7)
Neutrophils Relative %: 63 % (ref 43–77)
Platelets: 335 10*3/uL (ref 150–400)
RBC: 3.58 MIL/uL — ABNORMAL LOW (ref 3.87–5.11)
RDW: 13.5 % (ref 11.5–15.5)
WBC: 10.4 10*3/uL (ref 4.0–10.5)

## 2014-02-25 MED ORDER — MORPHINE SULFATE 4 MG/ML IJ SOLN
6.0000 mg | Freq: Once | INTRAMUSCULAR | Status: AC
  Administered 2014-02-25: 6 mg via INTRAVENOUS
  Filled 2014-02-25: qty 2

## 2014-02-25 MED ORDER — HYDROMORPHONE HCL PF 1 MG/ML IJ SOLN
1.0000 mg | Freq: Once | INTRAMUSCULAR | Status: AC
  Administered 2014-02-25: 1 mg via INTRAVENOUS
  Filled 2014-02-25: qty 1

## 2014-02-25 MED ORDER — ONDANSETRON HCL 4 MG/2ML IJ SOLN
4.0000 mg | Freq: Once | INTRAMUSCULAR | Status: AC
  Administered 2014-02-25: 4 mg via INTRAVENOUS
  Filled 2014-02-25: qty 2

## 2014-02-25 MED ORDER — HYDROMORPHONE HCL PF 1 MG/ML IJ SOLN
0.5000 mg | Freq: Once | INTRAMUSCULAR | Status: AC
  Administered 2014-02-25: 0.5 mg via INTRAMUSCULAR
  Filled 2014-02-25: qty 1

## 2014-02-25 MED ORDER — DIAZEPAM 5 MG/ML IJ SOLN
3.0000 mg | Freq: Once | INTRAMUSCULAR | Status: AC
  Administered 2014-02-25: 3 mg via INTRAVENOUS
  Filled 2014-02-25: qty 2

## 2014-02-25 MED ORDER — DIAZEPAM 2 MG PO TABS: 2.0000 mg | ORAL_TABLET | Freq: Four times a day (QID) | ORAL | Status: DC | PRN

## 2014-02-25 MED ORDER — KETOROLAC TROMETHAMINE 15 MG/ML IJ SOLN
15.0000 mg | Freq: Once | INTRAMUSCULAR | Status: AC
  Administered 2014-02-25: 15 mg via INTRAMUSCULAR
  Filled 2014-02-25: qty 1

## 2014-02-25 NOTE — ED Provider Notes (Signed)
CSN: [id number]     Arrival date & time 02/25/14  1027 History   First MD Initiated Contact with Patient 02/25/14 0920     Chief Complaint  Patient presents with  . headache   . Arm Pain    left     (Consider location/radiation/quality/duration/timing/severity/associated sxs/prior Treatment) HPI  80 year old female with headache and neck pain. Worsening over the past 2 months. Past history significant for C2 fracture, C5 fracture, central cord injury secondary to severe spinal stenosis C3-4 C4-18 December 2012.  s/p Decompressive laminectomy C3 C4-C5 and C6, segmental fixation with facet screws C3-C4 C5-C6 posterior lateral arthrodesis with autograft and allograft C3-C6 at that time.    Past Medical History  Diagnosis Date  . CHF (congestive heart failure)   . Coronary artery disease   . Hypertension   . Arthritis   . Edema of extremities   . Anxiety   . HLD (hyperlipidemia)   . Stroke   . Peripheral vascular disease    Past Surgical History  Procedure Laterality Date  . Total knee arthroplasty Bilateral     knee replacements  . Lumbar disc surgery    . Cholecystectomy    . Posterior cervical fusion/foraminotomy N/A 12/28/2012    Procedure: POSTERIOR CERVICAL FUSION/FORAMINOTOMY LEVEL 3;  Surgeon: Barnett Abu, MD;  Location: [facility];  Service: Neurosurgery;  Laterality: N/A;  Posterior Cervical Three-Six Laminectomy and Fusion  . Angioplasty    . Tonsillectomy    . Joint replacement     Family History  Problem Relation Age of Onset  . Dementia Sister     x 3  . CAD Brother   . Breast cancer Sister     x 3  . Heart disease Sister   . Heart disease Son     x 2  . Cancer Son   . Hyperlipidemia Son   . Hypertension Son   . Alzheimer's disease Sister   . Depression Daughter   . Hypertension Daughter   . Hyperlipidemia Daughter    History  Substance Use Topics  . Smoking status: Never Smoker   . Smokeless tobacco: Never Used  . Alcohol Use: Yes     Comment:  rarely   OB History   Grav Para Term Preterm Abortions TAB SAB Ect Mult Living                 Review of Systems  All systems reviewed and negative, other than as noted in HPI.   Allergies  Penicillins  Home Medications   Prior to Admission medications   Medication Sig Start Date End Date Taking? Authorizing Provider  ALPRAZolam Prudy Feeler) 0.25 MG tablet Take 0.25 mg by mouth at bedtime as needed for anxiety.    Historical Provider, MD  aspirin 325 MG tablet Take 1 tablet (325 mg total) by mouth daily. 10/14/13   Mcarthur Rossetti Angiulli, PA-C  calcium carbonate (OS-CAL) 600 MG TABS tablet Take 600 mg by mouth 2 (two) times daily with a meal.    Historical Provider, MD  diclofenac sodium (VOLTAREN) 1 % GEL Apply 2 g topically 4 (four) times daily as needed (knee pain). 10/14/13   Mcarthur Rossetti Angiulli, PA-C  furosemide (LASIX) 20 MG tablet Take 1 tablet (20 mg total) by mouth daily. 10/14/13   Mcarthur Rossetti Angiulli, PA-C  HYDROcodone-acetaminophen (NORCO/VICODIN) 5-325 MG per tablet Take 1 tablet by mouth every 6 (six) hours as needed for moderate pain. 10/14/13   Mcarthur Rossetti Angiulli, PA-C  losartan (COZAAR)  50 MG tablet Take 50 mg by mouth daily. 12/02/13   Historical Provider, MD  methocarbamol (ROBAXIN) 500 MG tablet Take 500 mg by mouth every 6 (six) hours as needed for muscle spasms.    Historical Provider, MD  metoprolol (LOPRESSOR) 50 MG tablet Take 1 tablet (50 mg total) by mouth 2 (two) times daily. 10/14/13   Mcarthur Rossetti Angiulli, PA-C  Multiple Vitamin (MULTIVITAMIN WITH MINERALS) TABS Take 1 tablet by mouth daily.    Historical Provider, MD  naproxen sodium (ANAPROX) 220 MG tablet Take 220 mg by mouth 2 (two) times daily as needed (pain).     Historical Provider, MD  pramipexole (MIRAPEX) 0.5 MG tablet Take 0.5 mg by mouth at bedtime.    Historical Provider, MD  pravastatin (PRAVACHOL) 80 MG tablet Take 80 mg by mouth daily.  09/09/13   Historical Provider, MD   BP 179/80  Pulse 60  Resp 18  SpO2  95% Physical Exam  Nursing note and vitals reviewed. Constitutional: She is oriented to person, place, and time. She appears well-developed and well-nourished.  Laying in bed. Appears uncomfortable.   HENT:  Head: Normocephalic and atraumatic.  Eyes: Conjunctivae are normal. Right eye exhibits no discharge. Left eye exhibits no discharge.  Neck: Neck supple.  Cardiovascular: Normal rate, regular rhythm and normal heart sounds.  Exam reveals no gallop and no friction rub.   No murmur heard. Pulmonary/Chest: Effort normal and breath sounds normal. No respiratory distress.  Abdominal: Soft. She exhibits no distension. There is no tenderness.  Musculoskeletal: She exhibits no edema and no tenderness.  Neck pain not reproducible with palpation.   Neurological: She is alert and oriented to person, place, and time. No cranial nerve deficit.  Decreased strength LUE. Sensation intact to light touch. Other extremities seem intact.   Skin: Skin is warm and dry.  Psychiatric: She has a normal mood and affect. Her behavior is normal. Thought content normal.    ED Course  Procedures (including critical care time) Labs Review Labs Reviewed - No data to display  Imaging Review No results found.  Ct Head Wo Contrast  02/25/2014   CLINICAL DATA:  Headache.  Prior cervical injury.  EXAM: CT HEAD WITHOUT CONTRAST  CT CERVICAL SPINE WITHOUT CONTRAST  TECHNIQUE: Multidetector CT imaging of the head and cervical spine was performed following the standard protocol without intravenous contrast. Multiplanar CT image reconstructions of the cervical spine were also generated.  COMPARISON:  Head CT 12/21/2013 cervical spine CT 12/25/2012  FINDINGS: CT HEAD FINDINGS  Skull and Sinuses:Chronic smooth calvarial thinning in the right parietal region. There has been mastoidectomy on the right, with clear mastoidectomy bowl. There is resected or erroded right more than left ossicles compatible with cholesteatoma. Chronic  opacification of the left mid earlier and small mastoid cavity.  Orbits: Scleral banding on the right.  Brain: No evidence of acute abnormality, such as acute infarction, hemorrhage, hydrocephalus, or mass lesion/mass effect. There is a remote left temporal parietal region cortical and subcortical infarct. Chronic small vessel disease with patchy ischemic gliosis in the periventricular white matter and in the deep gray nuclei, especially at the thalamus. Generalized cerebral volume loss  CT CERVICAL SPINE FINDINGS  Remote type 2 dens fracture without osseous fusion. There is anterior and leftward displacement. Anteriorly, the fracture is displaced up to 3.5 mm, no worse than on cervical radiography 02/08/2014. Anterior movement of the posterior arch of C1 causes advanced spinal canal stenosis at this level, likely with cord  compression. Smooth epidural high-density thickening in this region is likely ligamentous thickening and response to the chronic motion, similar to neck CTA 09/30/2013.  Previously seen C5 body fracture has healed. There is posterior fixation hardware from C3-C6, without adverse feature. There is complete posterior osseous fusion across the fixed levels.  Rapidly progressive degenerative disc change at T1-T2 with severe disc narrowing and new sclerosis. There is chronic anterior subluxation at this level related to advanced facet osteoarthritis. There is ankylosis of the imaged more inferior thoracic levels, accelerating the degenerative change.  No acute fracture identified. There is no prevertebral swelling or gross cervical canal hematoma (high-density material noted above is likely ligamentous). Right tracheoesophageal nodularity appears continuous and isodense to the thyroid, suggesting thyroid nodule rather than parathyroid adenoma.  These results were called by telephone at the time of interpretation on 02/25/2014 at 11:07 AM to Dr. Raeford RazorSTEPHEN Jazen Spraggins , who verbally acknowledged these results.   IMPRESSION: 1. No evidence of acute intracranial or cervical spine injury. 2. Remote type 2 dens fracture without osseous union. There is evidence of motion at the fracture, resulting in advanced spinal canal stenosis. Reference cervical spine flexion and extension radiography 02/08/2014. 3. C3-C6 posterior fixation and decompressed laminectomies. No adverse findings. 4. Rapidly progressive degenerative disc disease and facet osteoarthritis at T1-T2, accelerated by the cervical spine fixation and mid thoracic ankylosis. 5. Chronic skullbase and intracranial findings noted above.   Electronically Signed   By: Tiburcio PeaJonathan  Watts M.D.   On: 02/25/2014 11:10   Ct Cervical Spine Wo Contrast  02/25/2014   CLINICAL DATA:  Headache.  Prior cervical injury.  EXAM: CT HEAD WITHOUT CONTRAST  CT CERVICAL SPINE WITHOUT CONTRAST  TECHNIQUE: Multidetector CT imaging of the head and cervical spine was performed following the standard protocol without intravenous contrast. Multiplanar CT image reconstructions of the cervical spine were also generated.  COMPARISON:  Head CT 12/21/2013 cervical spine CT 12/25/2012  FINDINGS: CT HEAD FINDINGS  Skull and Sinuses:Chronic smooth calvarial thinning in the right parietal region. There has been mastoidectomy on the right, with clear mastoidectomy bowl. There is resected or erroded right more than left ossicles compatible with cholesteatoma. Chronic opacification of the left mid earlier and small mastoid cavity.  Orbits: Scleral banding on the right.  Brain: No evidence of acute abnormality, such as acute infarction, hemorrhage, hydrocephalus, or mass lesion/mass effect. There is a remote left temporal parietal region cortical and subcortical infarct. Chronic small vessel disease with patchy ischemic gliosis in the periventricular white matter and in the deep gray nuclei, especially at the thalamus. Generalized cerebral volume loss  CT CERVICAL SPINE FINDINGS  Remote type 2 dens fracture  without osseous fusion. There is anterior and leftward displacement. Anteriorly, the fracture is displaced up to 3.5 mm, no worse than on cervical radiography 02/08/2014. Anterior movement of the posterior arch of C1 causes advanced spinal canal stenosis at this level, likely with cord compression. Smooth epidural high-density thickening in this region is likely ligamentous thickening and response to the chronic motion, similar to neck CTA 09/30/2013.  Previously seen C5 body fracture has healed. There is posterior fixation hardware from C3-C6, without adverse feature. There is complete posterior osseous fusion across the fixed levels.  Rapidly progressive degenerative disc change at T1-T2 with severe disc narrowing and new sclerosis. There is chronic anterior subluxation at this level related to advanced facet osteoarthritis. There is ankylosis of the imaged more inferior thoracic levels, accelerating the degenerative change.  No acute fracture identified. There  is no prevertebral swelling or gross cervical canal hematoma (high-density material noted above is likely ligamentous). Right tracheoesophageal nodularity appears continuous and isodense to the thyroid, suggesting thyroid nodule rather than parathyroid adenoma.  These results were called by telephone at the time of interpretation on 02/25/2014 at 11:07 AM to Dr. Raeford RazorSTEPHEN Vernita Tague , who verbally acknowledged these results.  IMPRESSION: 1. No evidence of acute intracranial or cervical spine injury. 2. Remote type 2 dens fracture without osseous union. There is evidence of motion at the fracture, resulting in advanced spinal canal stenosis. Reference cervical spine flexion and extension radiography 02/08/2014. 3. C3-C6 posterior fixation and decompressed laminectomies. No adverse findings. 4. Rapidly progressive degenerative disc disease and facet osteoarthritis at T1-T2, accelerated by the cervical spine fixation and mid thoracic ankylosis. 5. Chronic skullbase  and intracranial findings noted above.   Electronically Signed   By: Tiburcio PeaJonathan  Watts M.D.   On: 02/25/2014 11:10    EKG Interpretation None      MDM   Final diagnoses:  Headache  Neck pain    Consulted neurosurgery. Reviewed imaging. Recommending place pt in cervical collar, but no acute surgical intervention. Symptomatic control and outpt neurosurgical FU.     Raeford RazorStephen Janielle Mittelstadt, MD 03/02/14 1014

## 2014-02-25 NOTE — ED Notes (Signed)
Pt transported to CT ?

## 2014-02-25 NOTE — ED Notes (Signed)
Pt's daughter called to pick pt up. Pt awaiting daughter's arrival.

## 2014-02-25 NOTE — ED Notes (Signed)
Pt broke her neck last year and had stroke in Jan this year and was in nursing home for rehab.  Pt been having headaches for about two months now that are severe and had injection in her neck and taking the percocet that she was prescribed but not helping with the pain.

## 2014-02-25 NOTE — ED Notes (Signed)
Pt returned from CT °

## 2014-02-28 ENCOUNTER — Encounter (HOSPITAL_BASED_OUTPATIENT_CLINIC_OR_DEPARTMENT_OTHER): Payer: Medicare Other | Attending: General Surgery

## 2014-02-28 DIAGNOSIS — L8994 Pressure ulcer of unspecified site, stage 4: Secondary | ICD-10-CM | POA: Insufficient documentation

## 2014-02-28 DIAGNOSIS — L89109 Pressure ulcer of unspecified part of back, unspecified stage: Secondary | ICD-10-CM | POA: Insufficient documentation

## 2014-03-09 ENCOUNTER — Other Ambulatory Visit (HOSPITAL_COMMUNITY): Payer: Self-pay | Admitting: Neurosurgery

## 2014-03-09 DIAGNOSIS — S129XXA Fracture of neck, unspecified, initial encounter: Secondary | ICD-10-CM

## 2014-03-13 ENCOUNTER — Encounter (HOSPITAL_COMMUNITY): Payer: Self-pay | Admitting: Emergency Medicine

## 2014-03-13 ENCOUNTER — Emergency Department (HOSPITAL_COMMUNITY)
Admission: EM | Admit: 2014-03-13 | Discharge: 2014-03-13 | Disposition: A | Discharge status: Home / Self Care | Payer: Medicare Other | Attending: Emergency Medicine | Admitting: Emergency Medicine

## 2014-03-13 ENCOUNTER — Emergency Department (HOSPITAL_COMMUNITY): Payer: Medicare Other

## 2014-03-13 DIAGNOSIS — E785 Hyperlipidemia, unspecified: Secondary | ICD-10-CM | POA: Insufficient documentation

## 2014-03-13 DIAGNOSIS — I10 Essential (primary) hypertension: Secondary | ICD-10-CM | POA: Insufficient documentation

## 2014-03-13 DIAGNOSIS — Z79899 Other long term (current) drug therapy: Secondary | ICD-10-CM | POA: Insufficient documentation

## 2014-03-13 DIAGNOSIS — M542 Cervicalgia: Secondary | ICD-10-CM | POA: Insufficient documentation

## 2014-03-13 DIAGNOSIS — R5381 Other malaise: Secondary | ICD-10-CM | POA: Diagnosis not present

## 2014-03-13 DIAGNOSIS — R51 Headache: Secondary | ICD-10-CM | POA: Insufficient documentation

## 2014-03-13 DIAGNOSIS — R1310 Dysphagia, unspecified: Secondary | ICD-10-CM | POA: Insufficient documentation

## 2014-03-13 DIAGNOSIS — R5383 Other fatigue: Secondary | ICD-10-CM | POA: Diagnosis not present

## 2014-03-13 DIAGNOSIS — M129 Arthropathy, unspecified: Secondary | ICD-10-CM | POA: Insufficient documentation

## 2014-03-13 DIAGNOSIS — Z880 Allergy status to penicillin: Secondary | ICD-10-CM | POA: Insufficient documentation

## 2014-03-13 DIAGNOSIS — R531 Weakness: Secondary | ICD-10-CM

## 2014-03-13 DIAGNOSIS — I2510 Atherosclerotic heart disease of native coronary artery without angina pectoris: Secondary | ICD-10-CM | POA: Diagnosis not present

## 2014-03-13 DIAGNOSIS — Z8673 Personal history of transient ischemic attack (TIA), and cerebral infarction without residual deficits: Secondary | ICD-10-CM | POA: Insufficient documentation

## 2014-03-13 DIAGNOSIS — I509 Heart failure, unspecified: Secondary | ICD-10-CM | POA: Diagnosis not present

## 2014-03-13 DIAGNOSIS — F411 Generalized anxiety disorder: Secondary | ICD-10-CM | POA: Diagnosis not present

## 2014-03-13 DIAGNOSIS — Z7982 Long term (current) use of aspirin: Secondary | ICD-10-CM | POA: Insufficient documentation

## 2014-03-13 DIAGNOSIS — S129XXS Fracture of neck, unspecified, sequela: Secondary | ICD-10-CM

## 2014-03-13 LAB — URINE MICROSCOPIC-ADD ON

## 2014-03-13 LAB — CBC WITH DIFFERENTIAL/PLATELET
BASOS PCT: 0 % (ref 0–1)
Basophils Absolute: 0 10*3/uL (ref 0.0–0.1)
Eosinophils Absolute: 0.1 10*3/uL (ref 0.0–0.7)
Eosinophils Relative: 1 % (ref 0–5)
HCT: 32.5 % — ABNORMAL LOW (ref 36.0–46.0)
Hemoglobin: 10.7 g/dL — ABNORMAL LOW (ref 12.0–15.0)
LYMPHS ABS: 1.3 10*3/uL (ref 0.7–4.0)
Lymphocytes Relative: 14 % (ref 12–46)
MCH: 31.2 pg (ref 26.0–34.0)
MCHC: 32.9 g/dL (ref 30.0–36.0)
MCV: 94.8 fL (ref 78.0–100.0)
MONOS PCT: 4 % (ref 3–12)
Monocytes Absolute: 0.3 10*3/uL (ref 0.1–1.0)
NEUTROS ABS: 7.2 10*3/uL (ref 1.7–7.7)
Neutrophils Relative %: 81 % — ABNORMAL HIGH (ref 43–77)
Platelets: 230 10*3/uL (ref 150–400)
RBC: 3.43 MIL/uL — ABNORMAL LOW (ref 3.87–5.11)
RDW: 13.5 % (ref 11.5–15.5)
WBC: 8.9 10*3/uL (ref 4.0–10.5)

## 2014-03-13 LAB — URINALYSIS, ROUTINE W REFLEX MICROSCOPIC
BILIRUBIN URINE: NEGATIVE
Glucose, UA: NEGATIVE mg/dL
HGB URINE DIPSTICK: NEGATIVE
KETONES UR: NEGATIVE mg/dL
NITRITE: NEGATIVE
PH: 6.5 (ref 5.0–8.0)
Protein, ur: NEGATIVE mg/dL
Specific Gravity, Urine: 1.01 (ref 1.005–1.030)
Urobilinogen, UA: 0.2 mg/dL (ref 0.0–1.0)

## 2014-03-13 LAB — BASIC METABOLIC PANEL
BUN: 19 mg/dL (ref 6–23)
CHLORIDE: 102 meq/L (ref 96–112)
CO2: 28 mEq/L (ref 19–32)
Calcium: 9.9 mg/dL (ref 8.4–10.5)
Creatinine, Ser: 0.77 mg/dL (ref 0.50–1.10)
GFR calc Af Amer: 90 mL/min — ABNORMAL LOW (ref >90)
GFR calc non Af Amer: 77 mL/min — ABNORMAL LOW (ref >90)
GLUCOSE: 108 mg/dL — AB (ref 70–99)
Potassium: 4.1 mEq/L (ref 3.7–5.3)
Sodium: 141 mEq/L (ref 137–147)

## 2014-03-13 NOTE — ED Notes (Signed)
Per EMS: Stroke year ago, affected left side, as well as a neck fracture about the same time that causes her to favor that left side.  Since yesterday 1000am, increased weakness to left side.   No neural deficits noted other than left sided weakness, also c/o headache x 2 days.  In rehab at home for left side stroke  Increased weakness noted by family at home as well as nursing assistant who works with her in the home.

## 2014-03-13 NOTE — Consult Note (Signed)
Reason for Consult: Neck pain and arm weakness left lower extremity weakness Referring Physician: Warnell Foresterrey Wofford md  [name] is an 80 y.o. female.  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 She underwent posterior cervical decompression from C3-C7. Posterior lateral fixation was performed. She improved slowly and was able to regain much use in her upper extremities. Her lower extremity function seemed to improve also. I seen her about a month and a half ago and at that time she is getting some symptoms of pain in the base of the neck. I referred her to Dr. Glori Luisichard Gray most. Her cervical fusion appeared to have healed stable he. 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 The patient has evidence of advanced rheumatoid arthritis throughout the cervical spine.  Over the past month or so the patient has had progressively worsening cervical spine pain she is seen by Dr. Maeola HarmanJoseph Stern and she underwent a CT scan of the cervical spine it demonstrates that she has some degenerative changes at the occiput through C3 levels. There is likely some cervical canal stenosis on that left side. The bones are markedly degenerated and the joints are very ectatic.  Dr. Venetia MaxonStern had started the patient on some Percocet 01/16/2024. The dose of this medication had to be increased in order to control her pain. She is now had several episodes of weakness on the left side which seems to be worse it is however episodic. She presented with weakness however on observing her today in the emergency room her strength in her left lower extremity is at least 4/5. Her upper extremity strength in grips is 4/5 but she  has markedly changes in her upper extremity secondary to rheumatoid degeneration.  Past Medical History  Diagnosis Date  . CHF (congestive heart failure)   . Coronary artery disease   . Hypertension   . Arthritis   . Edema of extremities   . Anxiety   . HLD (hyperlipidemia)   . Stroke   . Peripheral vascular disease     Past Surgical History  Procedure Laterality Date  . Total knee arthroplasty Bilateral     knee replacements  . Lumbar disc surgery    . Cholecystectomy    . Posterior cervical fusion/foraminotomy N/A 12/28/2012    Procedure: POSTERIOR CERVICAL FUSION/FORAMINOTOMY LEVEL 3;  Surgeon: Barnett AbuHenry Elsner, MD;  Location: [facility];  Service: Neurosurgery;  Laterality: N/A;  Posterior Cervical Three-Six Laminectomy and Fusion  . Angioplasty    . Tonsillectomy    . Joint replacement      Family History  Problem Relation Age of Onset  . Dementia Sister     x 3  . CAD Brother   . Breast cancer Sister     x 3  . Heart disease Sister   . Heart disease Son     x 2  . Cancer Son   . Hyperlipidemia Son   . Hypertension Son   . Alzheimer's disease Sister   . Depression Daughter   . Hypertension Daughter   . Hyperlipidemia Daughter     Social History:  reports that she has never smoked. She has never used smokeless tobacco. She reports that she  drinks alcohol. She reports that she does not use illicit drugs.  Allergies:  Allergies  Allergen Reactions  . Penicillins Shortness Of Breath    Throat closed up and had hives and nausea    Medications: I have reviewed the patient's current medications.  No results found for this or any previous visit (from the past 48 hour(s)).  Ct Head Wo Contrast  03/13/2014   CLINICAL DATA:  Headache and left-sided weakness. History of infarct in January 2015  EXAM: CT HEAD WITHOUT CONTRAST  CT CERVICAL SPINE WITHOUT CONTRAST  TECHNIQUE: Multidetector CT imaging of the head and cervical spine was performed following the standard  protocol without intravenous contrast. Multiplanar CT image reconstructions of the cervical spine were also generated.  COMPARISON:  12/31/2013 and 02/25/2014  FINDINGS: CT HEAD FINDINGS  Remote MCA territory infarct on the left. No new/acute intracranial findings as hemispheric infarction or intracranial hemorrhage. No mass lesions. No extra-axial fluid collections are identified. The bony structures are intact. No acute fracture. Stable osteoporosis. The paranasal sinuses and mastoid air cells are stable. Remote surgical changes involving the right mastoid and chronic left mastoid and middle ear effusion.  CT CERVICAL SPINE FINDINGS  Stable posterior fusion hardware extending from C3-C6. No complicating features. In stable ununited dens fracture. The dens is tilting to the left. Stable mild canal narrowing and C1-2. Wide decompressive laminectomies at C3-4, C4-5 and C5-6. No acute cervical spine fracture. These cul base and C1 articulations are maintained.  IMPRESSION: 1. Remote left MCA territory infarct. 2. No acute intracranial findings or mass lesion. 3. Chronic mastoid disease bilaterally with surgical changes noted on the right. 4. Remote ununited dens fracture. 5. Stable posterior fusion changes from C3-C6. 6. No acute cervical spine fracture.   Electronically Signed   By: Loralie ChampagneMark  Gallerani M.D.   On: 03/13/2014 13:08   Ct Cervical Spine Wo Contrast  03/13/2014   CLINICAL DATA:  Headache and left-sided weakness. History of infarct in January 2015  EXAM: CT HEAD WITHOUT CONTRAST  CT CERVICAL SPINE WITHOUT CONTRAST  TECHNIQUE: Multidetector CT imaging of the head and cervical spine was performed following the standard protocol without intravenous contrast. Multiplanar CT image reconstructions of the cervical spine were also generated.  COMPARISON:  12/31/2013 and 02/25/2014  FINDINGS: CT HEAD FINDINGS  Remote MCA territory infarct on the left. No new/acute intracranial findings as hemispheric infarction or  intracranial hemorrhage. No mass lesions. No extra-axial fluid collections are identified. The bony structures are intact. No acute fracture. Stable osteoporosis. The paranasal sinuses and mastoid air cells are stable. Remote surgical changes involving the right mastoid and chronic left mastoid and middle ear effusion.  CT CERVICAL SPINE FINDINGS  Stable posterior fusion hardware extending from C3-C6. No complicating features. In stable ununited dens fracture. The dens is tilting to the left. Stable mild canal narrowing and C1-2. Wide decompressive laminectomies at C3-4, C4-5 and C5-6. No acute cervical spine fracture. These cul base and C1 articulations are maintained.  IMPRESSION: 1. Remote left MCA territory infarct. 2. No acute intracranial findings or mass lesion. 3. Chronic mastoid disease bilaterally with surgical changes noted on the right. 4. Remote ununited dens fracture. 5. Stable posterior fusion changes from C3-C6. 6. No acute cervical spine fracture.   Electronically Signed   By: Loralie ChampagneMark  Gallerani M.D.   On: 03/13/2014 13:08    Review of Systems  Musculoskeletal: Positive for neck pain.  Neurological: Positive for focal weakness and weakness.       Focal  weakness and left lower extremity  Bilateral clumsiness in upper extremities  Psychiatric/Behavioral: Negative.    Blood pressure 180/120, pulse 62, temperature 97.4 F (36.3 C), temperature source Oral, resp. rate 20, height 5\' 2"  (1.575 m), weight 75.751 kg (167 lb), SpO2 99.00%. Physical Exam  Assessment/Plan: I long discussion in the emergency department with the patient and her 2 daughters who accompany her. I discussed the fact that she has advanced arthritic changes in the cervical cranial junction secondary to the rheumatoid arthritis. I noted that the pain issue is causing increasing reliance on narcotic pain medication and this is not likely to change with any surgical procedure that I could remotely consider. In fact I  discussed the prospect of any surgery for the craniocervical junction with the patient and her daughters I noted that any surgery would be extremely risky and the chance for yielding marked improvement or any significant improvement is very limited in that regard I am not keen on discussing surgical intervention as I believe that it is fraught with substantial risk which could worsen her situation.  I discussed the possibility that some change in her living situation may need to occur. This point she's been managed in her home place with nearly 24 hour supervision including regular visits with the daughters that help provide her care in addition to nursing care. Though I would like to provide some form of relief with surgical intervention I believe that the chances for any successful surgical intervention have long past and the best we can provide his comfort measures.  Whether any further workup regarding consideration of the possibility of a recent stroke needs to be undertaken I would defer to consultation from neurology for consideration of primary care. The MRI of her cervical spine that had been planned we put on hold list any further workup intracranially is deemed necessary.  ELSNER,HENRY J 03/13/2014, 2:20 PM

## 2014-03-13 NOTE — Discharge Instructions (Signed)
Followup with your primary doctor. Return to the emergency department if you develop worsening symptoms or new concerning symptoms.

## 2014-03-13 NOTE — ED Notes (Addendum)
Weakness on left side of body for 1 year after stroke, cervical fracture shortly before that.  In rehab at patient's home, since yesterday patient has been experiencing increased weakness on that side.  EMS reported headache x 2days, patient denies sudden onset and states she has more headaches than usual lately.    Dr. Loretha StaplerWofford at bedside  Family at bedside

## 2014-03-13 NOTE — Progress Notes (Signed)
  CARE MANAGEMENT ED NOTE 03/13/2014  Patient:  Julio AlmGLEASON,Glenisha A   Account Number:  [id number]  Date Initiated:  03/13/2014  Documentation initiated by:  Ferdinand CavaSCHETTINO,ANDREA  Subjective/Objective Assessment:   80 yo female with Medicare presenting to the ED with neck pain and increased left sided weakness     Subjective/Objective Assessment Detail:     Action/Plan:   [facility]   Action/Plan Detail:   Anticipated DC Date:       Status Recommendation to Physician:   Result of Recommendation:  Agreed    DC Planning Services  CM consult  Other    Choice offered to / List presented to:  C-4 Adult Children          Status of service:  Completed, signed off  ED Comments:   ED Comments Detail:  CM consulted for home health resources. This CM spoke with patients [facility] and they stated that they have private duty services in the home and [facility] and stated that they did not need any further services. Discussed resources in the community and provided a brochure for [facility]. No further questions or concerns. EDP made aware.

## 2014-03-13 NOTE — ED Provider Notes (Signed)
CSN: [id number]     Arrival date & time 03/13/14  1038 History   First MD Initiated Contact with Patient 03/13/14 1045     Chief Complaint  Patient presents with  . Stroke Symptoms    left side weakness, LSN 1000 am yesterday, headache x2days, previous stroke to same side 1 year ago.     (Consider location/radiation/quality/duration/timing/severity/associated sxs/prior Treatment) HPI Comments: 80 year old female with a history of a neck fracture about a year ago and a stroke about 6 months ago, each giving her some residual left-sided weakness. She has had waxing and waning symptoms over the past several weeks, but noticed more significant left-sided weakness starting yesterday.  Patient is a 80 y.o. female presenting with neurologic complaint.  Neurologic Problem This is a recurrent problem. The current episode started yesterday. The problem occurs constantly. Progression since onset: waxing and waning\ Pertinent negatives include no chest pain and no abdominal pain. Associated symptoms comments: Difficulty swallowing, feeling that something is stuck in throat.  Headache.  Neck pain.  .    Past Medical History  Diagnosis Date  . CHF (congestive heart failure)   . Coronary artery disease   . Hypertension   . Arthritis   . Edema of extremities   . Anxiety   . HLD (hyperlipidemia)   . Stroke   . Peripheral vascular disease    Past Surgical History  Procedure Laterality Date  . Total knee arthroplasty Bilateral     knee replacements  . Lumbar disc surgery    . Cholecystectomy    . Posterior cervical fusion/foraminotomy N/A 12/28/2012    Procedure: POSTERIOR CERVICAL FUSION/FORAMINOTOMY LEVEL 3;  Surgeon: Barnett AbuHenry Elsner, MD;  Location: [facility];  Service: Neurosurgery;  Laterality: N/A;  Posterior Cervical Three-Six Laminectomy and Fusion  . Angioplasty    . Tonsillectomy    . Joint replacement     Family History  Problem Relation Age of Onset  . Dementia Sister     x 3  .  CAD Brother   . Breast cancer Sister     x 3  . Heart disease Sister   . Heart disease Son     x 2  . Cancer Son   . Hyperlipidemia Son   . Hypertension Son   . Alzheimer's disease Sister   . Depression Daughter   . Hypertension Daughter   . Hyperlipidemia Daughter    History  Substance Use Topics  . Smoking status: Never Smoker   . Smokeless tobacco: Never Used  . Alcohol Use: Yes     Comment: rarely   OB History   Grav Para Term Preterm Abortions TAB SAB Ect Mult Living                 Review of Systems  Constitutional: Negative for fever.  Cardiovascular: Negative for chest pain.  Gastrointestinal: Negative for nausea, vomiting and abdominal pain.  All other systems reviewed and are negative.     Allergies  Penicillins  Home Medications   Prior to Admission medications   Medication Sig Start Date End Date Taking? Authorizing Provider  ALPRAZolam Prudy Feeler(XANAX) 0.25 MG tablet Take 0.25 mg by mouth at bedtime as needed for anxiety.   Yes Historical Provider, MD  aspirin 325 MG tablet Take 325 mg by mouth daily.   Yes Historical Provider, MD  Calcium Carb-Cholecalciferol (CALCIUM 600 + D PO) Take 1 tablet by mouth daily.   Yes Historical Provider, MD  diazepam (VALIUM) 2 MG tablet Take 1  tablet (2 mg total) by mouth every 6 (six) hours as needed for muscle spasms. 02/25/14  Yes Raeford Razor, MD  diclofenac sodium (VOLTAREN) 1 % GEL Apply 2 g topically 2 (two) times daily as needed (pain).   Yes Historical Provider, MD  furosemide (LASIX) 20 MG tablet Take 20 mg by mouth daily.   Yes Historical Provider, MD  HYDROcodone-acetaminophen (NORCO) 10-325 MG per tablet Take 1 tablet by mouth 4 (four) times daily as needed for moderate pain.   Yes Historical Provider, MD  losartan (COZAAR) 50 MG tablet Take 50 mg by mouth daily. 12/02/13  Yes Historical Provider, MD  Menthol, Topical Analgesic, (BIOFREEZE EX) Apply 1 application topically daily as needed (pain).   Yes Historical  Provider, MD  methocarbamol (ROBAXIN) 500 MG tablet Take 500 mg by mouth at bedtime as needed for muscle spasms.    Yes Historical Provider, MD  metoprolol (LOPRESSOR) 50 MG tablet Take 50 mg by mouth 2 (two) times daily.   Yes Historical Provider, MD  Multiple Vitamin (MULTIVITAMIN WITH MINERALS) TABS Take 1 tablet by mouth daily.   Yes Historical Provider, MD  naproxen sodium (ANAPROX) 220 MG tablet Take 220 mg by mouth 2 (two) times daily as needed (pain).    Yes Historical Provider, MD  oxyCODONE-acetaminophen (PERCOCET) 10-325 MG per tablet Take 1 tablet by mouth every 6 (six) hours.   Yes Historical Provider, MD  pramipexole (MIRAPEX) 0.5 MG tablet Take 0.5 mg by mouth daily as needed (restless leg syndrome).    Yes Historical Provider, MD  pravastatin (PRAVACHOL) 80 MG tablet Take 80 mg by mouth every evening.  09/09/13  Yes Historical Provider, MD   BP 180/120  Pulse 62  Temp(Src) 97.4 F (36.3 C) (Oral)  Resp 20  Ht 5\' 2"  (1.575 m)  Wt 167 lb (75.751 kg)  BMI 30.54 kg/m2  SpO2 99% Physical Exam  Nursing note and vitals reviewed. Constitutional: She is oriented to person, place, and time. She appears well-developed and well-nourished. No distress.  HENT:  Head: Normocephalic and atraumatic.  Mouth/Throat: Oropharynx is clear and moist.  Eyes: Conjunctivae are normal. Pupils are equal, round, and reactive to light. No scleral icterus.  Neck: Neck supple.  Cardiovascular: Normal rate, regular rhythm, normal heart sounds and intact distal pulses.   No murmur heard. Pulmonary/Chest: Effort normal and breath sounds normal. No stridor. No respiratory distress. She has no rales.  Abdominal: Soft. Bowel sounds are normal. She exhibits no distension. There is no tenderness.  Musculoskeletal: Normal range of motion.  Neurological: She is alert and oriented to person, place, and time. A cranial nerve deficit (mild left sided facial droop - described as old.) is present. Abnormal gait: not  tested.  Strength testing RUE and RLE - 5/5 LUE and LLE - 4+/5  Skin: Skin is warm and dry. No rash noted.  Psychiatric: She has a normal mood and affect. Her behavior is normal.    ED Course  Procedures (including critical care time) Labs Review Labs Reviewed  CBC WITH DIFFERENTIAL - Abnormal; Notable for the following:    RBC 3.43 (*)    Hemoglobin 10.7 (*)    HCT 32.5 (*)    Neutrophils Relative % 81 (*)    All other components within normal limits  BASIC METABOLIC PANEL - Abnormal; Notable for the following:    Glucose, Bld 108 (*)    GFR calc non Af Amer 77 (*)    GFR calc Af Amer 90 (*)  All other components within normal limits  URINALYSIS, ROUTINE W REFLEX MICROSCOPIC - Abnormal; Notable for the following:    Leukocytes, UA SMALL (*)    All other components within normal limits  URINE MICROSCOPIC-ADD ON - Abnormal; Notable for the following:    Squamous Epithelial / LPF MANY (*)    All other components within normal limits    Imaging Review Ct Head Wo Contrast  03/13/2014   CLINICAL DATA:  Headache and left-sided weakness. History of infarct in January 2015  EXAM: CT HEAD WITHOUT CONTRAST  CT CERVICAL SPINE WITHOUT CONTRAST  TECHNIQUE: Multidetector CT imaging of the head and cervical spine was performed following the standard protocol without intravenous contrast. Multiplanar CT image reconstructions of the cervical spine were also generated.  COMPARISON:  12/31/2013 and 02/25/2014  FINDINGS: CT HEAD FINDINGS  Remote MCA territory infarct on the left. No new/acute intracranial findings as hemispheric infarction or intracranial hemorrhage. No mass lesions. No extra-axial fluid collections are identified. The bony structures are intact. No acute fracture. Stable osteoporosis. The paranasal sinuses and mastoid air cells are stable. Remote surgical changes involving the right mastoid and chronic left mastoid and middle ear effusion.  CT CERVICAL SPINE FINDINGS  Stable  posterior fusion hardware extending from C3-C6. No complicating features. In stable ununited dens fracture. The dens is tilting to the left. Stable mild canal narrowing and C1-2. Wide decompressive laminectomies at C3-4, C4-5 and C5-6. No acute cervical spine fracture. These cul base and C1 articulations are maintained.  IMPRESSION: 1. Remote left MCA territory infarct. 2. No acute intracranial findings or mass lesion. 3. Chronic mastoid disease bilaterally with surgical changes noted on the right. 4. Remote ununited dens fracture. 5. Stable posterior fusion changes from C3-C6. 6. No acute cervical spine fracture.   Electronically Signed   By: Loralie ChampagneMark  Gallerani M.D.   On: 03/13/2014 13:08   Ct Cervical Spine Wo Contrast  03/13/2014   CLINICAL DATA:  Headache and left-sided weakness. History of infarct in January 2015  EXAM: CT HEAD WITHOUT CONTRAST  CT CERVICAL SPINE WITHOUT CONTRAST  TECHNIQUE: Multidetector CT imaging of the head and cervical spine was performed following the standard protocol without intravenous contrast. Multiplanar CT image reconstructions of the cervical spine were also generated.  COMPARISON:  12/31/2013 and 02/25/2014  FINDINGS: CT HEAD FINDINGS  Remote MCA territory infarct on the left. No new/acute intracranial findings as hemispheric infarction or intracranial hemorrhage. No mass lesions. No extra-axial fluid collections are identified. The bony structures are intact. No acute fracture. Stable osteoporosis. The paranasal sinuses and mastoid air cells are stable. Remote surgical changes involving the right mastoid and chronic left mastoid and middle ear effusion.  CT CERVICAL SPINE FINDINGS  Stable posterior fusion hardware extending from C3-C6. No complicating features. In stable ununited dens fracture. The dens is tilting to the left. Stable mild canal narrowing and C1-2. Wide decompressive laminectomies at C3-4, C4-5 and C5-6. No acute cervical spine fracture. These cul base and C1  articulations are maintained.  IMPRESSION: 1. Remote left MCA territory infarct. 2. No acute intracranial findings or mass lesion. 3. Chronic mastoid disease bilaterally with surgical changes noted on the right. 4. Remote ununited dens fracture. 5. Stable posterior fusion changes from C3-C6. 6. No acute cervical spine fracture.   Electronically Signed   By: Loralie ChampagneMark  Gallerani M.D.   On: 03/13/2014 13:08  All radiology studies independently viewed by me.      EKG Interpretation   Date/Time:  Monday March 13 2014 10:53:38 EDT Ventricular Rate:  58 PR Interval:  162 QRS Duration: 91 QT Interval:  440 QTC Calculation: 432 R Axis:   32 Text Interpretation:  Age not entered, assumed to be  80 years old for  purpose of ECG interpretation Unknown rhythm, irregular rate - likely  sinus rhythm  Abnormal R-wave progression, early transition No significant  change was found Confirmed by [facility]  MD, TREY ([id number]) on 03/13/2014  1:13:06 PM      MDM   Final diagnoses:  Left-sided weakness  Cervical spine fracture, sequela    80 year old female with history of cervical spine injury and right-sided MCA stroke presenting with left-sided weakness. She reports this weakness is worse than baseline. Unclear exactly when her weakness worsened again, but her daughter reports that she thinks it has been about 3 days. On exam, she does have some mild weakness, but not any significant weakness.  Of note, she had some left-sided weakness documented on her recent ED visit and reports left-sided weakness at baseline.  Some of her weakness is thought to be secondary to her cervical spine injury. She was evaluated by Dr. Danielle Dess in the emergency department.  He felt that, unfortunately, pt would not be likely to benefit from repeat surgery.    As regards possible CVA, I think this is unlikely.  Her symptoms are more likely secondary to her cervical spine injury +/- reactivation of old stroke symptoms.  UA pending.  Also,  she is on medical management for stroke since her initial stroke in January.  I discussed this with the patient and her family.  They also spoke with our case manager regarding possible need for possible placement in a nursing facility.  Family is comfortable with outpatient treatment plan.      Candyce Churn III, MD 03/13/14 [phone]

## 2014-03-13 NOTE — ED Notes (Signed)
Ambulated pt with assistance from daughter, with a walker outside of the room.

## 2014-03-19 ENCOUNTER — Emergency Department (HOSPITAL_COMMUNITY): Payer: Medicare Other

## 2014-03-19 ENCOUNTER — Encounter (HOSPITAL_COMMUNITY): Payer: Self-pay | Admitting: Emergency Medicine

## 2014-03-19 ENCOUNTER — Inpatient Hospital Stay (HOSPITAL_COMMUNITY)
Admission: EM | Admit: 2014-03-19 | Discharge: 2014-03-27 | DRG: 592 | Disposition: A | Discharge status: Hospice | Payer: Medicare Other | Attending: Internal Medicine | Admitting: Internal Medicine

## 2014-03-19 DIAGNOSIS — I63239 Cerebral infarction due to unspecified occlusion or stenosis of unspecified carotid arteries: Secondary | ICD-10-CM

## 2014-03-19 DIAGNOSIS — K661 Hemoperitoneum: Secondary | ICD-10-CM | POA: Diagnosis present

## 2014-03-19 DIAGNOSIS — G8929 Other chronic pain: Secondary | ICD-10-CM

## 2014-03-19 DIAGNOSIS — M810 Age-related osteoporosis without current pathological fracture: Secondary | ICD-10-CM

## 2014-03-19 DIAGNOSIS — Z8673 Personal history of transient ischemic attack (TIA), and cerebral infarction without residual deficits: Secondary | ICD-10-CM

## 2014-03-19 DIAGNOSIS — Z79899 Other long term (current) drug therapy: Secondary | ICD-10-CM

## 2014-03-19 DIAGNOSIS — G2581 Restless legs syndrome: Secondary | ICD-10-CM

## 2014-03-19 DIAGNOSIS — Z515 Encounter for palliative care: Secondary | ICD-10-CM

## 2014-03-19 DIAGNOSIS — L8994 Pressure ulcer of unspecified site, stage 4: Secondary | ICD-10-CM | POA: Diagnosis present

## 2014-03-19 DIAGNOSIS — I509 Heart failure, unspecified: Secondary | ICD-10-CM | POA: Diagnosis present

## 2014-03-19 DIAGNOSIS — H269 Unspecified cataract: Secondary | ICD-10-CM | POA: Diagnosis present

## 2014-03-19 DIAGNOSIS — D649 Anemia, unspecified: Secondary | ICD-10-CM

## 2014-03-19 DIAGNOSIS — Z96659 Presence of unspecified artificial knee joint: Secondary | ICD-10-CM

## 2014-03-19 DIAGNOSIS — I739 Peripheral vascular disease, unspecified: Secondary | ICD-10-CM | POA: Diagnosis present

## 2014-03-19 DIAGNOSIS — X58XXXA Exposure to other specified factors, initial encounter: Secondary | ICD-10-CM | POA: Diagnosis present

## 2014-03-19 DIAGNOSIS — M545 Low back pain, unspecified: Secondary | ICD-10-CM | POA: Diagnosis present

## 2014-03-19 DIAGNOSIS — L89109 Pressure ulcer of unspecified part of back, unspecified stage: Principal | ICD-10-CM | POA: Diagnosis present

## 2014-03-19 DIAGNOSIS — N828 Other female genital tract fistulae: Secondary | ICD-10-CM

## 2014-03-19 DIAGNOSIS — M549 Dorsalgia, unspecified: Secondary | ICD-10-CM

## 2014-03-19 DIAGNOSIS — Z66 Do not resuscitate: Secondary | ICD-10-CM | POA: Diagnosis present

## 2014-03-19 DIAGNOSIS — I10 Essential (primary) hypertension: Secondary | ICD-10-CM | POA: Diagnosis present

## 2014-03-19 DIAGNOSIS — M1990 Unspecified osteoarthritis, unspecified site: Secondary | ICD-10-CM

## 2014-03-19 DIAGNOSIS — F411 Generalized anxiety disorder: Secondary | ICD-10-CM | POA: Diagnosis present

## 2014-03-19 DIAGNOSIS — Z981 Arthrodesis status: Secondary | ICD-10-CM

## 2014-03-19 DIAGNOSIS — N824 Other female intestinal-genital tract fistulae: Secondary | ICD-10-CM | POA: Diagnosis present

## 2014-03-19 DIAGNOSIS — E785 Hyperlipidemia, unspecified: Secondary | ICD-10-CM | POA: Diagnosis present

## 2014-03-19 DIAGNOSIS — I5032 Chronic diastolic (congestive) heart failure: Secondary | ICD-10-CM

## 2014-03-19 DIAGNOSIS — D72829 Elevated white blood cell count, unspecified: Secondary | ICD-10-CM | POA: Diagnosis present

## 2014-03-19 DIAGNOSIS — I6529 Occlusion and stenosis of unspecified carotid artery: Secondary | ICD-10-CM

## 2014-03-19 DIAGNOSIS — G8250 Quadriplegia, unspecified: Secondary | ICD-10-CM | POA: Diagnosis present

## 2014-03-19 DIAGNOSIS — I8390 Asymptomatic varicose veins of unspecified lower extremity: Secondary | ICD-10-CM

## 2014-03-19 DIAGNOSIS — I2510 Atherosclerotic heart disease of native coronary artery without angina pectoris: Secondary | ICD-10-CM | POA: Diagnosis present

## 2014-03-19 DIAGNOSIS — Z9861 Coronary angioplasty status: Secondary | ICD-10-CM

## 2014-03-19 DIAGNOSIS — N179 Acute kidney failure, unspecified: Secondary | ICD-10-CM

## 2014-03-19 DIAGNOSIS — R531 Weakness: Secondary | ICD-10-CM

## 2014-03-19 DIAGNOSIS — L89154 Pressure ulcer of sacral region, stage 4: Secondary | ICD-10-CM

## 2014-03-19 DIAGNOSIS — E871 Hypo-osmolality and hyponatremia: Secondary | ICD-10-CM

## 2014-03-19 DIAGNOSIS — M48061 Spinal stenosis, lumbar region without neurogenic claudication: Secondary | ICD-10-CM

## 2014-03-19 DIAGNOSIS — L89159 Pressure ulcer of sacral region, unspecified stage: Secondary | ICD-10-CM | POA: Diagnosis present

## 2014-03-19 DIAGNOSIS — I5042 Chronic combined systolic (congestive) and diastolic (congestive) heart failure: Secondary | ICD-10-CM | POA: Diagnosis present

## 2014-03-19 DIAGNOSIS — M5440 Lumbago with sciatica, unspecified side: Secondary | ICD-10-CM

## 2014-03-19 DIAGNOSIS — D638 Anemia in other chronic diseases classified elsewhere: Secondary | ICD-10-CM

## 2014-03-19 DIAGNOSIS — R4781 Slurred speech: Secondary | ICD-10-CM

## 2014-03-19 DIAGNOSIS — S32009A Unspecified fracture of unspecified lumbar vertebra, initial encounter for closed fracture: Secondary | ICD-10-CM | POA: Diagnosis present

## 2014-03-19 DIAGNOSIS — Z7982 Long term (current) use of aspirin: Secondary | ICD-10-CM

## 2014-03-19 HISTORY — DX: Serous retinal detachment, unspecified eye: H33.20

## 2014-03-19 HISTORY — DX: Pressure ulcer of sacral region, unspecified stage: L89.159

## 2014-03-19 HISTORY — DX: Unspecified cataract: H26.9

## 2014-03-19 LAB — CBC WITH DIFFERENTIAL/PLATELET
Basophils Absolute: 0 10*3/uL (ref 0.0–0.1)
Basophils Relative: 0 % (ref 0–1)
Eosinophils Absolute: 0.1 10*3/uL (ref 0.0–0.7)
Eosinophils Relative: 1 % (ref 0–5)
HCT: 31.9 % — ABNORMAL LOW (ref 36.0–46.0)
HEMOGLOBIN: 10.7 g/dL — AB (ref 12.0–15.0)
LYMPHS ABS: 1.5 10*3/uL (ref 0.7–4.0)
LYMPHS PCT: 13 % (ref 12–46)
MCH: 31.3 pg (ref 26.0–34.0)
MCHC: 33.5 g/dL (ref 30.0–36.0)
MCV: 93.3 fL (ref 78.0–100.0)
MONOS PCT: 6 % (ref 3–12)
Monocytes Absolute: 0.7 10*3/uL (ref 0.1–1.0)
NEUTROS ABS: 9.7 10*3/uL — AB (ref 1.7–7.7)
NEUTROS PCT: 80 % — AB (ref 43–77)
PLATELETS: 233 10*3/uL (ref 150–400)
RBC: 3.42 MIL/uL — AB (ref 3.87–5.11)
RDW: 13.6 % (ref 11.5–15.5)
WBC: 12.1 10*3/uL — AB (ref 4.0–10.5)

## 2014-03-19 LAB — COMPREHENSIVE METABOLIC PANEL
ALT: 21 U/L (ref 0–35)
ANION GAP: 15 (ref 5–15)
AST: 34 U/L (ref 0–37)
Albumin: 3.5 g/dL (ref 3.5–5.2)
Alkaline Phosphatase: 127 U/L — ABNORMAL HIGH (ref 39–117)
BILIRUBIN TOTAL: 0.4 mg/dL (ref 0.3–1.2)
BUN: 26 mg/dL — AB (ref 6–23)
CHLORIDE: 99 meq/L (ref 96–112)
CO2: 24 meq/L (ref 19–32)
Calcium: 10 mg/dL (ref 8.4–10.5)
Creatinine, Ser: 1.05 mg/dL (ref 0.50–1.10)
GFR calc non Af Amer: 49 mL/min — ABNORMAL LOW (ref >90)
GFR, EST AFRICAN AMERICAN: 57 mL/min — AB (ref >90)
GLUCOSE: 103 mg/dL — AB (ref 70–99)
POTASSIUM: 4.6 meq/L (ref 3.7–5.3)
SODIUM: 138 meq/L (ref 137–147)
Total Protein: 7.4 g/dL (ref 6.0–8.3)

## 2014-03-19 LAB — URINALYSIS, ROUTINE W REFLEX MICROSCOPIC
BILIRUBIN URINE: NEGATIVE
GLUCOSE, UA: NEGATIVE mg/dL
HGB URINE DIPSTICK: NEGATIVE
Ketones, ur: NEGATIVE mg/dL
Leukocytes, UA: NEGATIVE
Nitrite: NEGATIVE
PROTEIN: NEGATIVE mg/dL
Specific Gravity, Urine: 1.013 (ref 1.005–1.030)
UROBILINOGEN UA: 0.2 mg/dL (ref 0.0–1.0)
pH: 7.5 (ref 5.0–8.0)

## 2014-03-19 LAB — LIPASE, BLOOD: Lipase: 17 U/L (ref 11–59)

## 2014-03-19 MED ORDER — IOHEXOL 300 MG/ML  SOLN
100.0000 mL | Freq: Once | INTRAMUSCULAR | Status: AC | PRN
  Administered 2014-03-19: 100 mL via INTRAVENOUS

## 2014-03-19 MED ORDER — VANCOMYCIN HCL IN DEXTROSE 1-5 GM/200ML-% IV SOLN
1000.0000 mg | Freq: Once | INTRAVENOUS | Status: AC
  Administered 2014-03-19: 1000 mg via INTRAVENOUS
  Filled 2014-03-19: qty 200

## 2014-03-19 MED ORDER — HYDROMORPHONE HCL PF 1 MG/ML IJ SOLN
0.5000 mg | Freq: Once | INTRAMUSCULAR | Status: AC
  Administered 2014-03-19: 0.5 mg via INTRAVENOUS
  Filled 2014-03-19: qty 1

## 2014-03-19 MED ORDER — FENTANYL CITRATE 0.05 MG/ML IJ SOLN
50.0000 ug | Freq: Once | INTRAMUSCULAR | Status: AC
  Administered 2014-03-19: 50 ug via INTRAVENOUS
  Filled 2014-03-19: qty 2

## 2014-03-19 MED ORDER — HYDROMORPHONE HCL PF 1 MG/ML IJ SOLN
1.0000 mg | Freq: Once | INTRAMUSCULAR | Status: AC
Start: 2014-03-19 — End: 2014-03-19
  Administered 2014-03-19: 1 mg via INTRAVENOUS
  Filled 2014-03-19: qty 1

## 2014-03-19 MED ORDER — HYDROMORPHONE HCL PF 1 MG/ML IJ SOLN
1.0000 mg | Freq: Once | INTRAMUSCULAR | Status: AC
  Administered 2014-03-19: 1 mg via INTRAVENOUS
  Filled 2014-03-19: qty 1

## 2014-03-19 MED ORDER — SODIUM CHLORIDE 0.9 % IV SOLN
1.0000 g | Freq: Once | INTRAVENOUS | Status: AC
  Administered 2014-03-20: 1 g via INTRAVENOUS
  Filled 2014-03-19: qty 1

## 2014-03-19 NOTE — ED Provider Notes (Signed)
CSN: [id number]     Arrival date & time 03/19/14  1737 History   First MD Initiated Contact with Patient 03/19/14 1748     No chief complaint on file.    (Consider location/radiation/quality/duration/timing/severity/associated sxs/prior Treatment) HPI 80 year old female presents with severe low back pain since last night. Denies fevers or chills. Denies any injury. States the pain is across her whole low back. No weakness or numbness. Her chronic neck pain is also hurting. No urinary symptoms. Has been constipated but denies abdominal pain. No vomiting.   Past Medical History  Diagnosis Date  . CHF (congestive heart failure)   . Coronary artery disease   . Hypertension   . Arthritis   . Edema of extremities   . Anxiety   . HLD (hyperlipidemia)   . Stroke   . Peripheral vascular disease    Past Surgical History  Procedure Laterality Date  . Total knee arthroplasty Bilateral     knee replacements  . Lumbar disc surgery    . Cholecystectomy    . Posterior cervical fusion/foraminotomy N/A 12/28/2012    Procedure: POSTERIOR CERVICAL FUSION/FORAMINOTOMY LEVEL 3;  Surgeon: Barnett Abu, MD;  Location: [facility];  Service: Neurosurgery;  Laterality: N/A;  Posterior Cervical Three-Six Laminectomy and Fusion  . Angioplasty    . Tonsillectomy    . Joint replacement     Family History  Problem Relation Age of Onset  . Dementia Sister     x 3  . CAD Brother   . Breast cancer Sister     x 3  . Heart disease Sister   . Heart disease Son     x 2  . Cancer Son   . Hyperlipidemia Son   . Hypertension Son   . Alzheimer's disease Sister   . Depression Daughter   . Hypertension Daughter   . Hyperlipidemia Daughter    History  Substance Use Topics  . Smoking status: Never Smoker   . Smokeless tobacco: Never Used  . Alcohol Use: Yes     Comment: rarely   OB History   Grav Para Term Preterm Abortions TAB SAB Ect Mult Living                 Review of Systems  Constitutional:  Negative for fever.  Gastrointestinal: Positive for abdominal pain and constipation. Negative for vomiting.  Genitourinary: Negative for dysuria.  Musculoskeletal: Positive for back pain.  All other systems reviewed and are negative.     Allergies  Penicillins  Home Medications   Prior to Admission medications   Medication Sig Start Date End Date Taking? Authorizing Provider  ALPRAZolam Prudy Feeler) 0.25 MG tablet Take 0.25 mg by mouth at bedtime as needed for anxiety.    Historical Provider, MD  aspirin 325 MG tablet Take 325 mg by mouth daily.    Historical Provider, MD  Calcium Carb-Cholecalciferol (CALCIUM 600 + D PO) Take 1 tablet by mouth daily.    Historical Provider, MD  diazepam (VALIUM) 2 MG tablet Take 1 tablet (2 mg total) by mouth every 6 (six) hours as needed for muscle spasms. 02/25/14   Raeford Razor, MD  diclofenac sodium (VOLTAREN) 1 % GEL Apply 2 g topically 2 (two) times daily as needed (pain).    Historical Provider, MD  furosemide (LASIX) 20 MG tablet Take 20 mg by mouth daily.    Historical Provider, MD  HYDROcodone-acetaminophen (NORCO) 10-325 MG per tablet Take 1 tablet by mouth 4 (four) times daily  as needed for moderate pain.    Historical Provider, MD  losartan (COZAAR) 50 MG tablet Take 50 mg by mouth daily. 12/02/13   Historical Provider, MD  Menthol, Topical Analgesic, (BIOFREEZE EX) Apply 1 application topically daily as needed (pain).    Historical Provider, MD  methocarbamol (ROBAXIN) 500 MG tablet Take 500 mg by mouth at bedtime as needed for muscle spasms.     Historical Provider, MD  metoprolol (LOPRESSOR) 50 MG tablet Take 50 mg by mouth 2 (two) times daily.    Historical Provider, MD  Multiple Vitamin (MULTIVITAMIN WITH MINERALS) TABS Take 1 tablet by mouth daily.    Historical Provider, MD  naproxen sodium (ANAPROX) 220 MG tablet Take 220 mg by mouth 2 (two) times daily as needed (pain).     Historical Provider, MD  oxyCODONE-acetaminophen (PERCOCET)  10-325 MG per tablet Take 1 tablet by mouth every 6 (six) hours.    Historical Provider, MD  pramipexole (MIRAPEX) 0.5 MG tablet Take 0.5 mg by mouth daily as needed (restless leg syndrome).     Historical Provider, MD  pravastatin (PRAVACHOL) 80 MG tablet Take 80 mg by mouth every evening.  09/09/13   Historical Provider, MD   BP 204/77  Pulse 73  Temp(Src) 97.7 F (36.5 C) (Oral)  Resp 24  SpO2 99% Physical Exam  Nursing note and vitals reviewed. Constitutional: She is oriented to person, place, and time. She appears well-developed and well-nourished.  Appears in severe pain  HENT:  Head: Normocephalic and atraumatic.  Right Ear: External ear normal.  Left Ear: External ear normal.  Nose: Nose normal.  Eyes: Right eye exhibits no discharge. Left eye exhibits no discharge.  Cardiovascular: Normal rate, regular rhythm and normal heart sounds.   Pulmonary/Chest: Effort normal and breath sounds normal.  Abdominal: Soft. She exhibits no distension. There is no tenderness.  Musculoskeletal:       Lumbar back: She exhibits tenderness (mild, diffuse lower back tenderness) and bony tenderness.       Back:  Neurological: She is alert and oriented to person, place, and time.  Skin: Skin is warm and dry.    ED Course  Procedures (including critical care time) Labs Review Labs Reviewed  CBC WITH DIFFERENTIAL - Abnormal; Notable for the following:    WBC 12.1 (*)    RBC 3.42 (*)    Hemoglobin 10.7 (*)    HCT 31.9 (*)    Neutrophils Relative % 80 (*)    Neutro Abs 9.7 (*)    All other components within normal limits  COMPREHENSIVE METABOLIC PANEL - Abnormal; Notable for the following:    Glucose, Bld 103 (*)    BUN 26 (*)    Alkaline Phosphatase 127 (*)    GFR calc non Af Amer 49 (*)    GFR calc Af Amer 57 (*)    All other components within normal limits  LIPASE, BLOOD  URINALYSIS, ROUTINE W REFLEX MICROSCOPIC    Imaging Review Ct Abdomen Pelvis W Contrast  03/19/2014    CLINICAL DATA:  Abdominal pain, low back pain, nausea.  EXAM: CT ABDOMEN AND PELVIS WITH CONTRAST  TECHNIQUE: Multidetector CT imaging of the abdomen and pelvis was performed using the standard protocol following bolus administration of intravenous contrast.  CONTRAST:  100mL OMNIPAQUE IOHEXOL 300 MG/ML  SOLN  COMPARISON:  None.  FINDINGS: Dependent atelectasis in the lung bases. Densely calcified coronary arteries. No effusions. Heart is upper limits normal in size. Oral contrast material is seen  within the esophagus which may be related to dysmotility or reflux.  Prior cholecystectomy. Common bile duct is dilated, likely related to post cholecystectomy state. No focal abnormality within the liver, spleen, pancreas, adrenals. Areas of atrophy and scarring in the posterior upper and mid poles of the right kidney. Given the heavily calcified aorta and renal arteries, this is likely from chronic renovascular disease. Small cyst in the midpole of the left kidney. No hydronephrosis.  Scattered colonic diverticulosis. Large stool burden throughout the colon. Small bowel is decompressed. Stomach is unremarkable.  Gas noted within the endometrial canal of the uterus. The sigmoid colon is contiguous with the fundus of the uterus and there are several diverticula in the region, but no inflammatory process or visible fistula, but fistula is a concern if the patient has not had recent instrumentation.  Gas noted within the soft tissues extending from the skin surface in the midline of the buttocks deep to near the coccyx compatible with large decubitus ulcer.  There is also a gas collection noted to the left of the aortic bifurcation between the aortic bifurcation and the left psoas muscle in the left retroperitoneum (image 47). This is adjacent to a severely degenerated L4-5 disc with vacuum disc noted, but no definite connection to the disc space. This conceivably could represent a large herniated disc fragment, but other  cause of gas cannot be excluded. Again, no significant inflammation surrounding this area.  IMPRESSION: Gas within the scratch head gas fills the endometrial canal of the uterus. The sigmoid colon is a medially contiguous to the uterus with diverticulosis, but no inflammatory process noted at this time. 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 Given the lack of inflammation, this may be a chronic longstanding process.  Large sacral decubitus ulcer. Gas extends down to the coccyx. No bony changes to suggest osteomyelitis.  Small gas collection in the left retroperitoneum near the aortic bifurcation of unknown etiology. Question large herniated disc fragment from the adjacent degenerated disc. Again, no inflammation in this area to suggest active inflammatory process or to suggest this is an abscess.  These results were called by telephone at the time of interpretation on 03/19/2014 at 9:55 PM to Dr. Pricilla LovelessSCOTT Samhitha Rosen , who verbally acknowledged these results.   Electronically Signed   By: Charlett NoseKevin  Dover M.D.   On: 03/19/2014 21:55     EKG Interpretation None      MDM   Final diagnoses:  Sacral decubitus ulcer, unspecified pressure ulcer stage    Patient's pain is mostly controlled after multiple rounds of IV Dilaudid. The patient's CT scan is concerning for multiple small gas collections. One is near her large/deep sacral decubitus ulcer. For this she will be given IV vancomycin and IV Invanz after discussion with Dr. Lindie SpruceWyatt. 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 At this time she is stable, with a mild white count but no fever or hypotension. She is stable for admission to the hospitalist for IV antibiotics.    Audree CamelScott T Kemar Pandit, MD 03/20/14 0000

## 2014-03-19 NOTE — ED Notes (Signed)
Attempted to call report. Floor RN unable to accept report.  

## 2014-03-19 NOTE — ED Notes (Signed)
Family reports the pt has been having HA x 2 months.

## 2014-03-19 NOTE — ED Notes (Signed)
Per EMS - pt coming from home. Pt has chronic back pain. Pt c/o of pain in lumbar region, pt is normally up at the house with assistance but today the pain was more than normal and not up as much as normal. BP 198/90. Pt also c/o HA that started this morning, sts it is very mild. Hx of CVA and HTN. Stroke scale negative. Pt has been taking percocet at home, last took at 3pm. And then 1 Aleve at 5pm. Pt was seen here last week for numbness and tingling. HR 74 RR 18.

## 2014-03-20 ENCOUNTER — Encounter (HOSPITAL_COMMUNITY): Payer: Self-pay | Admitting: Internal Medicine

## 2014-03-20 DIAGNOSIS — M545 Low back pain, unspecified: Secondary | ICD-10-CM | POA: Diagnosis present

## 2014-03-20 DIAGNOSIS — N824 Other female intestinal-genital tract fistulae: Secondary | ICD-10-CM | POA: Diagnosis present

## 2014-03-20 DIAGNOSIS — L89109 Pressure ulcer of unspecified part of back, unspecified stage: Secondary | ICD-10-CM

## 2014-03-20 DIAGNOSIS — I10 Essential (primary) hypertension: Secondary | ICD-10-CM

## 2014-03-20 DIAGNOSIS — L8994 Pressure ulcer of unspecified site, stage 4: Secondary | ICD-10-CM

## 2014-03-20 DIAGNOSIS — K5900 Constipation, unspecified: Secondary | ICD-10-CM

## 2014-03-20 DIAGNOSIS — N828 Other female genital tract fistulae: Secondary | ICD-10-CM | POA: Diagnosis present

## 2014-03-20 LAB — COMPREHENSIVE METABOLIC PANEL
ALT: 19 U/L (ref 0–35)
AST: 29 U/L (ref 0–37)
Albumin: 3.1 g/dL — ABNORMAL LOW (ref 3.5–5.2)
Alkaline Phosphatase: 119 U/L — ABNORMAL HIGH (ref 39–117)
Anion gap: 13 (ref 5–15)
BUN: 24 mg/dL — ABNORMAL HIGH (ref 6–23)
CALCIUM: 9.4 mg/dL (ref 8.4–10.5)
CO2: 26 mEq/L (ref 19–32)
Chloride: 98 mEq/L (ref 96–112)
Creatinine, Ser: 0.83 mg/dL (ref 0.50–1.10)
GFR calc non Af Amer: 65 mL/min — ABNORMAL LOW (ref >90)
GFR, EST AFRICAN AMERICAN: 75 mL/min — AB (ref >90)
GLUCOSE: 113 mg/dL — AB (ref 70–99)
Potassium: 4.2 mEq/L (ref 3.7–5.3)
SODIUM: 137 meq/L (ref 137–147)
TOTAL PROTEIN: 7 g/dL (ref 6.0–8.3)
Total Bilirubin: 0.4 mg/dL (ref 0.3–1.2)

## 2014-03-20 LAB — CBC WITH DIFFERENTIAL/PLATELET
Basophils Absolute: 0 10*3/uL (ref 0.0–0.1)
Basophils Relative: 0 % (ref 0–1)
EOS ABS: 0 10*3/uL (ref 0.0–0.7)
Eosinophils Relative: 0 % (ref 0–5)
HCT: 32.9 % — ABNORMAL LOW (ref 36.0–46.0)
Hemoglobin: 10.7 g/dL — ABNORMAL LOW (ref 12.0–15.0)
LYMPHS ABS: 0.9 10*3/uL (ref 0.7–4.0)
LYMPHS PCT: 8 % — AB (ref 12–46)
MCH: 30.9 pg (ref 26.0–34.0)
MCHC: 32.5 g/dL (ref 30.0–36.0)
MCV: 95.1 fL (ref 78.0–100.0)
Monocytes Absolute: 0.7 10*3/uL (ref 0.1–1.0)
Monocytes Relative: 7 % (ref 3–12)
NEUTROS PCT: 85 % — AB (ref 43–77)
Neutro Abs: 9.2 10*3/uL — ABNORMAL HIGH (ref 1.7–7.7)
Platelets: 218 10*3/uL (ref 150–400)
RBC: 3.46 MIL/uL — AB (ref 3.87–5.11)
RDW: 13.8 % (ref 11.5–15.5)
WBC: 10.8 10*3/uL — ABNORMAL HIGH (ref 4.0–10.5)

## 2014-03-20 LAB — BLOOD GAS, ARTERIAL
ACID-BASE DEFICIT: 0.2 mmol/L (ref 0.0–2.0)
Bicarbonate: 23.5 mEq/L (ref 20.0–24.0)
Drawn by: 31297
O2 CONTENT: 3 L/min
O2 SAT: 99.6 %
PCO2 ART: 35.8 mmHg (ref 35.0–45.0)
PO2 ART: 110 mmHg — AB (ref 80.0–100.0)
Patient temperature: 98.6
TCO2: 24.6 mmol/L (ref 0–100)
pH, Arterial: 7.433 (ref 7.350–7.450)

## 2014-03-20 LAB — SEDIMENTATION RATE: SED RATE: 74 mm/h — AB (ref 0–22)

## 2014-03-20 LAB — GLUCOSE, CAPILLARY
GLUCOSE-CAPILLARY: 117 mg/dL — AB (ref 70–99)
GLUCOSE-CAPILLARY: 116 mg/dL — AB (ref 70–99)
GLUCOSE-CAPILLARY: 96 mg/dL (ref 70–99)
GLUCOSE-CAPILLARY: 106 mg/dL — AB (ref 70–99)
GLUCOSE-CAPILLARY: 122 mg/dL — AB (ref 70–99)

## 2014-03-20 MED ORDER — SODIUM CHLORIDE 0.9 % IV SOLN
INTRAVENOUS | Status: DC
  Administered 2014-03-20: 03:00:00 via INTRAVENOUS

## 2014-03-20 MED ORDER — VANCOMYCIN HCL IN DEXTROSE 1-5 GM/200ML-% IV SOLN
1000.0000 mg | INTRAVENOUS | Status: DC
  Administered 2014-03-21: 1000 mg via INTRAVENOUS
  Filled 2014-03-20 (×2): qty 200

## 2014-03-20 MED ORDER — SODIUM CHLORIDE 0.9 % IJ SOLN: 9.0000 mL | INTRAMUSCULAR | Status: DC | PRN

## 2014-03-20 MED ORDER — HYDRALAZINE HCL 20 MG/ML IJ SOLN: 10.0000 mg | INTRAMUSCULAR | Status: DC | PRN

## 2014-03-20 MED ORDER — NALOXONE HCL 0.4 MG/ML IJ SOLN: 0.4000 mg | INTRAMUSCULAR | Status: DC | PRN

## 2014-03-20 MED ORDER — ACETAMINOPHEN 325 MG PO TABS
650.0000 mg | ORAL_TABLET | Freq: Four times a day (QID) | ORAL | Status: DC | PRN
  Administered 2014-03-20: 650 mg via ORAL
  Filled 2014-03-20: qty 2

## 2014-03-20 MED ORDER — ACETAMINOPHEN 650 MG RE SUPP: 650.0000 mg | Freq: Four times a day (QID) | RECTAL | Status: DC | PRN

## 2014-03-20 MED ORDER — HYDROMORPHONE 0.3 MG/ML IV SOLN
INTRAVENOUS | Status: DC
  Administered 2014-03-20: 1.2 mg via INTRAVENOUS
  Administered 2014-03-20: 0.3 mg via INTRAVENOUS
  Administered 2014-03-20: 0.6 mg via INTRAVENOUS
  Administered 2014-03-20: 03:00:00 via INTRAVENOUS
  Filled 2014-03-20: qty 25

## 2014-03-20 MED ORDER — SODIUM CHLORIDE 0.9 % IV SOLN
1.0000 g | INTRAVENOUS | Status: DC
  Administered 2014-03-21: 1 g via INTRAVENOUS
  Filled 2014-03-20 (×3): qty 1

## 2014-03-20 MED ORDER — WHITE PETROLATUM GEL
Status: AC
  Administered 2014-03-20: 12:00:00
  Filled 2014-03-20: qty 5

## 2014-03-20 MED ORDER — MORPHINE SULFATE 2 MG/ML IJ SOLN
2.0000 mg | INTRAMUSCULAR | Status: DC
  Administered 2014-03-20 – 2014-03-21 (×7): 2 mg via INTRAVENOUS
  Filled 2014-03-20 (×6): qty 1

## 2014-03-20 MED ORDER — METOPROLOL TARTRATE 1 MG/ML IV SOLN
2.5000 mg | Freq: Four times a day (QID) | INTRAVENOUS | Status: DC
  Administered 2014-03-20 – 2014-03-26 (×20): 2.5 mg via INTRAVENOUS
  Filled 2014-03-20 (×39): qty 5

## 2014-03-20 MED ORDER — HEPARIN SODIUM (PORCINE) 5000 UNIT/ML IJ SOLN
5000.0000 [IU] | Freq: Three times a day (TID) | INTRAMUSCULAR | Status: DC
  Administered 2014-03-20 – 2014-03-22 (×7): 5000 [IU] via SUBCUTANEOUS
  Filled 2014-03-20 (×14): qty 1

## 2014-03-20 MED ORDER — SODIUM CHLORIDE 0.9 % IV BOLUS (SEPSIS)
500.0000 mL | INTRAVENOUS | Status: DC | PRN
  Administered 2014-03-20: 500 mL via INTRAVENOUS

## 2014-03-20 MED ORDER — ONDANSETRON HCL 4 MG/2ML IJ SOLN: 4.0000 mg | Freq: Four times a day (QID) | INTRAMUSCULAR | Status: DC | PRN

## 2014-03-20 MED ORDER — DICLOFENAC SODIUM 1 % TD GEL
2.0000 g | Freq: Two times a day (BID) | TRANSDERMAL | Status: DC | PRN
  Administered 2014-03-26: 2 g via TOPICAL
  Filled 2014-03-20: qty 100

## 2014-03-20 MED ORDER — HYDROCODONE-ACETAMINOPHEN 7.5-325 MG PO TABS
1.0000 | ORAL_TABLET | Freq: Three times a day (TID) | ORAL | Status: DC
  Administered 2014-03-20: 1 via ORAL
  Filled 2014-03-20: qty 1

## 2014-03-20 MED ORDER — NALOXONE HCL 0.4 MG/ML IJ SOLN
0.4000 mg | INTRAMUSCULAR | Status: DC | PRN
  Administered 2014-03-20 (×2): 0.4 mg via INTRAVENOUS
  Filled 2014-03-20 (×2): qty 1

## 2014-03-20 MED ORDER — MORPHINE SULFATE 2 MG/ML IJ SOLN
INTRAMUSCULAR | Status: AC
  Filled 2014-03-20: qty 1

## 2014-03-20 MED ORDER — MORPHINE SULFATE 2 MG/ML IJ SOLN
1.0000 mg | INTRAMUSCULAR | Status: DC | PRN
  Administered 2014-03-20: 1 mg via INTRAVENOUS
  Filled 2014-03-20: qty 1

## 2014-03-20 MED ORDER — HYDROMORPHONE HCL PF 1 MG/ML IJ SOLN
1.0000 mg | Freq: Once | INTRAMUSCULAR | Status: AC
  Administered 2014-03-20: 1 mg via INTRAVENOUS
  Filled 2014-03-20: qty 1

## 2014-03-20 MED ORDER — ONDANSETRON HCL 4 MG PO TABS: 4.0000 mg | ORAL_TABLET | Freq: Four times a day (QID) | ORAL | Status: DC | PRN

## 2014-03-20 MED ORDER — ONDANSETRON HCL 4 MG/2ML IJ SOLN
4.0000 mg | Freq: Four times a day (QID) | INTRAMUSCULAR | Status: DC | PRN
  Administered 2014-03-20 – 2014-03-25 (×3): 4 mg via INTRAVENOUS
  Filled 2014-03-20 (×3): qty 2

## 2014-03-20 NOTE — Progress Notes (Signed)
ANTIBIOTIC CONSULT NOTE - INITIAL  Pharmacy Consult for vancomycin Indication: wound infection  Allergies  Allergen Reactions  . Penicillins Hives, Shortness Of Breath and Nausea Only    Throat closed up and had hives and nausea    Patient Measurements: Height: 5' 1.81" (157 cm) Weight: 162 lb 6.4 oz (73.664 kg) IBW/kg (Calculated) : 49.67  Vital Signs: Temp: 97.8 F (36.6 C) (07/06 0033) Temp src: Oral (07/06 0033) BP: 182/80 mmHg (07/06 0050) Pulse Rate: 86 (07/06 0033)  Labs:  Recent Labs  03/19/14 1935  WBC 12.1*  HGB 10.7*  PLT 233  CREATININE 1.05   Estimated Creatinine Clearance: 40 ml/min (by C-G formula based on Cr of 1.05).   Medical History: Past Medical History  Diagnosis Date  . CHF (congestive heart failure)   . Coronary artery disease   . Hypertension   . Arthritis   . Edema of extremities   . Anxiety   . HLD (hyperlipidemia)   . Peripheral vascular disease   . Stroke     left sided weakness    Medications:  Prescriptions prior to admission  Medication Sig Dispense Refill  . ALPRAZolam (XANAX) 0.25 MG tablet Take 0.25 mg by mouth at bedtime as needed for anxiety.      Marland Kitchen. aspirin 325 MG tablet Take 325 mg by mouth daily.      . Calcium Carb-Cholecalciferol (CALCIUM 600 + D PO) Take 1 tablet by mouth daily.      . diclofenac sodium (VOLTAREN) 1 % GEL Apply 2 g topically 2 (two) times daily as needed (pain).      . furosemide (LASIX) 20 MG tablet Take 20 mg by mouth daily.      Marland Kitchen. losartan (COZAAR) 50 MG tablet Take 50 mg by mouth daily.      . Menthol, Topical Analgesic, (BIOFREEZE EX) Apply 1 application topically daily as needed (pain).      . methocarbamol (ROBAXIN) 500 MG tablet Take 500 mg by mouth every 6 (six) hours as needed for muscle spasms.       . metoprolol (LOPRESSOR) 50 MG tablet Take 50 mg by mouth 2 (two) times daily.      . Multiple Vitamin (MULTIVITAMIN WITH MINERALS) TABS Take 1 tablet by mouth daily.      . naproxen sodium  (ANAPROX) 220 MG tablet Take 220 mg by mouth 2 (two) times daily as needed (pain).       Marland Kitchen. oxyCODONE-acetaminophen (PERCOCET) 10-325 MG per tablet Take 1 tablet by mouth every 6 (six) hours.      . pramipexole (MIRAPEX) 0.5 MG tablet Take 0.5 mg by mouth at bedtime.       . pravastatin (PRAVACHOL) 80 MG tablet Take 80 mg by mouth every evening.        Scheduled:  . ertapenem  1 g Intravenous Q24H  . heparin  5,000 Units Subcutaneous 3 times per day  . HYDROmorphone PCA 0.3 mg/mL   Intravenous 6 times per day  . metoprolol  2.5 mg Intravenous 4 times per day   Infusions:  . sodium chloride      Assessment: 80yo female c/o severe low back pain since last night, found to have sacral debubitus ulcer, CT negative for osteo, to begin IV ABX.  Goal of Therapy:  Vancomycin trough level 10-15 mcg/ml  Plan:  Rec'd vanc 1g in ED; will continue with vancomycin 1000mg  IV Q24H and monitor CBC, Cx, levels prn.  Vernard GamblesVeronda Adelyn Roscher, PharmD, BCPS  03/20/2014,2:59 AM

## 2014-03-20 NOTE — H&P (Addendum)
[facility] History and Physical  [name] ZOX:[id number] DOB: 1931-06-19 DOA: 03/19/2014  Referring physician: ER physician. PCP: Astrid DivineGRIFFIN,ELAINE COLLINS, MD   Chief Complaint: Back pain.  HPI: [name] is a 80 y.o. female with history of sacral decubitus ulcer, progressive quadriparesis with history of cervical fusion surgery, CHF, CVA, CAD presents with increasing low back pain over the last few days. Patient's pain is mostly of low back around the sacral area where patient has a stage IV decubitus ulcer. Pain has been constant. Denies any associated nausea vomiting diarrhea or abdominal pain. Denies any chest pain or shortness of breath fever chills. Patient has been having progressive weakness of her extremities particularly lower and has been followed by neurosurgeon Dr. Danielle DessElsner. 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 There is also small gas in the left retropectoral area. Patient in addition has chronic neck pain. On-call general surgeon Dr. Lindie SpruceWyatt has been notified about the colon uterine fistula. Patient has been admitted for further management. Patient has been started on empiric antibiotics.   Review of Systems: As presented in the history of presenting illness, rest negative.  Past Medical History  Diagnosis Date  . CHF (congestive heart failure)   . Coronary artery disease   . Hypertension   . Arthritis   . Edema of extremities   . Anxiety   . HLD (hyperlipidemia)   . Peripheral vascular disease   . Stroke     left sided weakness   Past Surgical History  Procedure Laterality Date  . Total knee arthroplasty Bilateral     knee replacements  . Lumbar disc surgery    . Cholecystectomy    . Posterior cervical fusion/foraminotomy N/A 12/28/2012    Procedure: POSTERIOR CERVICAL FUSION/FORAMINOTOMY LEVEL 3;  Surgeon: Barnett AbuHenry Elsner, MD;   Location: [facility];  Service: Neurosurgery;  Laterality: N/A;  Posterior Cervical Three-Six Laminectomy and Fusion  . Angioplasty    . Tonsillectomy    . Joint replacement     Social History:  reports that she has never smoked. She has never used smokeless tobacco. She reports that she drinks alcohol. She reports that she does not use illicit drugs. Where does patient live home. Can patient participate in ADLs? Not sure.  Allergies  Allergen Reactions  . Penicillins Hives, Shortness Of Breath and Nausea Only    Throat closed up and had hives and nausea    Family History:  Family History  Problem Relation Age of Onset  . Dementia Sister     x 3  . CAD Brother   . Breast cancer Sister     x 3  . Heart disease Sister   . Heart disease Son     x 2  . Cancer Son   . Hyperlipidemia Son   . Hypertension Son   . Alzheimer's disease Sister   . Depression Daughter   . Hypertension Daughter   . Hyperlipidemia Daughter       Prior to Admission medications   Medication Sig Start Date End Date Taking? Authorizing Provider  ALPRAZolam Prudy Feeler(XANAX) 0.25 MG tablet Take 0.25 mg by mouth at bedtime as needed for anxiety.   Yes Historical Provider, MD  aspirin 325 MG tablet Take 325 mg by mouth daily.   Yes Historical Provider, MD  Calcium Carb-Cholecalciferol (CALCIUM 600 + D PO) Take 1 tablet by mouth daily.  Yes Historical Provider, MD  diclofenac sodium (VOLTAREN) 1 % GEL Apply 2 g topically 2 (two) times daily as needed (pain).   Yes Historical Provider, MD  furosemide (LASIX) 20 MG tablet Take 20 mg by mouth daily.   Yes Historical Provider, MD  losartan (COZAAR) 50 MG tablet Take 50 mg by mouth daily. 12/02/13  Yes Historical Provider, MD  Menthol, Topical Analgesic, (BIOFREEZE EX) Apply 1 application topically daily as needed (pain).   Yes Historical Provider, MD  methocarbamol (ROBAXIN) 500 MG tablet Take 500 mg by mouth every 6 (six) hours as needed for muscle spasms.    Yes  Historical Provider, MD  metoprolol (LOPRESSOR) 50 MG tablet Take 50 mg by mouth 2 (two) times daily.   Yes Historical Provider, MD  Multiple Vitamin (MULTIVITAMIN WITH MINERALS) TABS Take 1 tablet by mouth daily.   Yes Historical Provider, MD  naproxen sodium (ANAPROX) 220 MG tablet Take 220 mg by mouth 2 (two) times daily as needed (pain).    Yes Historical Provider, MD  oxyCODONE-acetaminophen (PERCOCET) 10-325 MG per tablet Take 1 tablet by mouth every 6 (six) hours.   Yes Historical Provider, MD  pramipexole (MIRAPEX) 0.5 MG tablet Take 0.5 mg by mouth at bedtime.    Yes Historical Provider, MD  pravastatin (PRAVACHOL) 80 MG tablet Take 80 mg by mouth every evening.  09/09/13  Yes Historical Provider, MD    Physical Exam: Filed Vitals:   03/19/14 2100 03/19/14 2300 03/20/14 0033 03/20/14 0050  BP: 176/69 187/71 179/72 182/80  Pulse: 77 85 86   Temp:   97.8 F (36.6 C)   TempSrc:   Oral   Resp: 20 21 18    Weight:   73.664 kg (162 lb 6.4 oz)   SpO2: 100% 100% 93%      General:  Well-developed and moderately nourished.  Eyes: Anicteric no pallor.  ENT: No discharge from the ears eyes nose mouth.  Neck: No mass felt. No neck rigidity.  Cardiovascular: S1-S2 heard.  Respiratory: No rhonchi or crepitations.  Abdomen: Soft nontender bowel sounds present no guarding or rigidity.  Skin: Stage IV sacral decubitus ulcer with no active discharge.  Musculoskeletal: Patient has back pain on moving her lower extremities.  Psychiatric: Appears normal.  Neurologic: Alert awake oriented to time place and person. Patient is able to move all extremities but weak.  Labs on Admission:  Basic Metabolic Panel:  Recent Labs Lab 03/13/14 1407 03/19/14 1935  NA 141 138  K 4.1 4.6  CL 102 99  CO2 28 24  GLUCOSE 108* 103*  BUN 19 26*  CREATININE 0.77 1.05  CALCIUM 9.9 10.0   Liver Function Tests:  Recent Labs Lab 03/19/14 1935  AST 34  ALT 21  ALKPHOS 127*  BILITOT 0.4   PROT 7.4  ALBUMIN 3.5    Recent Labs Lab 03/19/14 1935  LIPASE 17   No results found for this basename: AMMONIA,  in the last 168 hours CBC:  Recent Labs Lab 03/13/14 1407 03/19/14 1935  WBC 8.9 12.1*  NEUTROABS 7.2 9.7*  HGB 10.7* 10.7*  HCT 32.5* 31.9*  MCV 94.8 93.3  PLT 230 233   Cardiac Enzymes: No results found for this basename: CKTOTAL, CKMB, CKMBINDEX, TROPONINI,  in the last 168 hours  BNP (last 3 results)  Recent Labs  10/02/13 0555  PROBNP 2552.0*   CBG: No results found for this basename: GLUCAP,  in the last 168 hours  Radiological Exams on Admission: Ct Abdomen  Pelvis W Contrast  03/19/2014   CLINICAL DATA:  Abdominal pain, low back pain, nausea.  EXAM: CT ABDOMEN AND PELVIS WITH CONTRAST  TECHNIQUE: Multidetector CT imaging of the abdomen and pelvis was performed using the standard protocol following bolus administration of intravenous contrast.  CONTRAST:  100mL OMNIPAQUE IOHEXOL 300 MG/ML  SOLN  COMPARISON:  None.  FINDINGS: Dependent atelectasis in the lung bases. Densely calcified coronary arteries. No effusions. Heart is upper limits normal in size. Oral contrast material is seen within the esophagus which may be related to dysmotility or reflux.  Prior cholecystectomy. Common bile duct is dilated, likely related to post cholecystectomy state. No focal abnormality within the liver, spleen, pancreas, adrenals. Areas of atrophy and scarring in the posterior upper and mid poles of the right kidney. Given the heavily calcified aorta and renal arteries, this is likely from chronic renovascular disease. Small cyst in the midpole of the left kidney. No hydronephrosis.  Scattered colonic diverticulosis. Large stool burden throughout the colon. Small bowel is decompressed. Stomach is unremarkable.  Gas noted within the endometrial canal of the uterus. The sigmoid colon is contiguous with the fundus of the uterus and there are several diverticula in the region, but  no inflammatory process or visible fistula, but fistula is a concern if the patient has not had recent instrumentation.  Gas noted within the soft tissues extending from the skin surface in the midline of the buttocks deep to near the coccyx compatible with large decubitus ulcer.  There is also a gas collection noted to the left of the aortic bifurcation between the aortic bifurcation and the left psoas muscle in the left retroperitoneum (image 47). This is adjacent to a severely degenerated L4-5 disc with vacuum disc noted, but no definite connection to the disc space. This conceivably could represent a large herniated disc fragment, but other cause of gas cannot be excluded. Again, no significant inflammation surrounding this area.  IMPRESSION: Gas within the scratch head gas fills the endometrial canal of the uterus. The sigmoid colon is a medially contiguous to the uterus with diverticulosis, but no inflammatory process noted at this time. 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 Given the lack of inflammation, this may be a chronic longstanding process.  Large sacral decubitus ulcer. Gas extends down to the coccyx. No bony changes to suggest osteomyelitis.  Small gas collection in the left retroperitoneum near the aortic bifurcation of unknown etiology. Question large herniated disc fragment from the adjacent degenerated disc. Again, no inflammation in this area to suggest active inflammatory process or to suggest this is an abscess.  These results were called by telephone at the time of interpretation on 03/19/2014 at 9:55 PM to Dr. Pricilla LovelessSCOTT GOLDSTON , who verbally acknowledged these results.   Electronically Signed   By: Charlett NoseKevin  Dover M.D.   On: 03/19/2014 21:55     Assessment/Plan Principal Problem:   Sacral decubitus ulcer Active Problems:   CHF (congestive heart failure)   Hypertension   Colouterine fistula   Low back pain   1. Low back  pain - cause not clear but may be related to patient's worsening sacral decubitus ulcer. Patient will be placed on empiric antibiotics. I have ordered MRI of the L-spine and T-spine to check for any osteomyelitis and also does mention of possible fragments of disc in the CT results. Check sedimentation rate. Wound team consult has been requested. Patient has not had much relief  with IV Dilaudid boluses so I have placed patient on PCA pump. 2. Possible Colo Uterine fistula - patient is kept n.p.o. Surgery to see patient for further recommendations. Patient on empiric antibiotics. 3. History of hypertension and CHF - presently appears compensated. I have placed patient on scheduled dose of metoprolol 2.5 mg IV every 6 hourly with when necessary IV hydralazine as patient is n.p.o. 4. History of CVA. 5. Anemia - closely follow CBC.    Code Status: Full code.  Family Communication: None.  Disposition Plan: Admit to inpatient.    Shaila Gilchrest N. [facility] Pager [phone].  If 7PM-7AM, please contact night-coverage [email or web address] Password [email or web address] 03/20/2014, 2:56 AM

## 2014-03-20 NOTE — Consult Note (Addendum)
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  Pt states wound has been present for several months and she is followed as an outpatient by the wound care center. Wound type: Stage 4; bone palpable with swab.  Pressure Ulcer POA: Yes Measurement: 2X3X1.5cm with tunneling to 2.5cm at 5:30 o'clock  Wound bed: 100% beefy red Drainage (amount, consistency, odor) Slight odor, mod amt green-tinged drainage. Periwound: Patchy areas of partial thickness skin loss surrounding wound; appearance consistent with medical adhesive related skin injury (MARSI) from previous tape stripping. Pt is also incontinent of urine at this time and moisture is exacerbating MARSI areas. Dressing procedure/placement/frequency: Aquacel to absorb drainage and provide antimicrobial benefits.  Air mattresses to reduce pressure to affected area. Pt can resume follow-up with the outpatient wound care center after discharge. [facility] has assessed wound and does not plan to follow.  MRI results are pending.  If this indicates osteomyelitis, then ortho or surgical consult wound be indicated since that diagnosis is [facility] scope [facility]. Please re-consult if further assistance is needed.  Thank-you,  Cammie Mcgeeawn Aubryn Spinola MSN, RN, CWOCN, [place] Elder[place]CN-AP, CNS [phone]

## 2014-03-20 NOTE — Progress Notes (Signed)
Called by Rn for assistance with patient who is lethargic, hypotensive in the 80's and ?? Low oxygen saturation.  Upon my arrival to patient's room, RN and family at bedside.  MD has been paged and orders received., Narcan has already been given and fluid bolus started.  Patient sitting up in bed with nasal cannula on, skin warm and dry.  Patient denies any pain.  Patient repeatedly stating she "wants to die", "wants to go home to her husband" and giving family instructions for when she dies.  Pulse ox monitor changed out, Sats reading 97%, HR 120's.  BP 112/63.  Respiratory therapy at bedside to draw ABG.  Rn to call if assistance needed

## 2014-03-20 NOTE — Consult Note (Signed)
[facility] Consult Note  [name] 1930/10/24  [id number].    Requesting MD: Dr. 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 female with PMH of sacral decubitus ulcer, progressive quadriparesis with history of cervical fusion surgery, CHF, CVA, CAD presents with increasing low back pain over the last few days. Patient's pain is mostly of low back around the sacral area where patient has a stage IV decubitus ulcer. Pain has been constant. Patient has been having progressive weakness of her extremities particularly lower and has been followed by neurosurgeon Dr. Ellene Route.  The decubitus ulcer has been there for >1year and she has been followed by the wound clinic.  She has a home health aid who helps her throughout the day and changes her dressings.  She says its become worse over the last 1-2 weeks and become more painful started draining.  Denies any nausea, vomiting, diarrhea, abdominal pain,chest pain or shortness of breath, fever, or chills.  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 There is also small collection of gas in the left retroperitoneum area - ? Large herniated disc.   She denies ever having diverticulitis.  GI procedures - 2 tubal ligations - because the first didn't work.  She had a tubal or ectopic pregnancy and had the procedure repeated.  She denies any vaginal bleeding, purulent or feculent drainage or air passing.  She denies ever having a hernia.  Other abdominal surgery includes Cholecystectomy.  Last colonoscopy/EGD done in July 2014.   ROS: All systems reviewed and otherwise negative except for as above  Family History  Problem Relation Age of Onset  . Dementia Sister     x 3  .  CAD Brother   . Breast cancer Sister     x 3  . Heart disease Sister   . Heart disease Son     x 2  . Cancer Son   . Hyperlipidemia Son   . Hypertension Son   . Alzheimer's disease Sister   . Depression Daughter   . Hypertension Daughter   . Hyperlipidemia Daughter     Past Medical History  Diagnosis Date  . CHF (congestive heart failure)   . Coronary artery disease   . Hypertension   . Arthritis   . Edema of extremities   . Anxiety   . HLD (hyperlipidemia)   . Peripheral vascular disease   . Stroke     left sided weakness    Past Surgical History  Procedure Laterality Date  . Total knee arthroplasty Bilateral     knee replacements  . Lumbar disc surgery    . Cholecystectomy    . Posterior cervical fusion/foraminotomy N/A 12/28/2012    Procedure: POSTERIOR CERVICAL FUSION/FORAMINOTOMY LEVEL 3;  Surgeon: Kristeen Miss, MD;  Location: [facility];  Service: Neurosurgery;  Laterality: N/A;  Posterior Cervical Three-Six Laminectomy and Fusion  . Angioplasty    . Tonsillectomy    . Joint replacement      Social History:  reports that she has never smoked. She has never used smokeless tobacco. She reports that she drinks alcohol. She reports that she does not use illicit drugs.  Allergies:  Allergies  Allergen Reactions  . Penicillins Hives, Shortness Of Breath and Nausea Only  Throat closed up and had hives and nausea    Medications Prior to Admission  Medication Sig Dispense Refill  . ALPRAZolam (XANAX) 0.25 MG tablet Take 0.25 mg by mouth at bedtime as needed for anxiety.      Marland Kitchen aspirin 325 MG tablet Take 325 mg by mouth daily.      . Calcium Carb-Cholecalciferol (CALCIUM 600 + D PO) Take 1 tablet by mouth daily.      . diclofenac sodium (VOLTAREN) 1 % GEL Apply 2 g topically 2 (two) times daily as needed (pain).      . furosemide (LASIX) 20 MG tablet Take 20 mg by mouth daily.      Marland Kitchen losartan (COZAAR) 50 MG tablet Take 50 mg by mouth daily.      . Menthol,  Topical Analgesic, (BIOFREEZE EX) Apply 1 application topically daily as needed (pain).      . methocarbamol (ROBAXIN) 500 MG tablet Take 500 mg by mouth every 6 (six) hours as needed for muscle spasms.       . metoprolol (LOPRESSOR) 50 MG tablet Take 50 mg by mouth 2 (two) times daily.      . Multiple Vitamin (MULTIVITAMIN WITH MINERALS) TABS Take 1 tablet by mouth daily.      . naproxen sodium (ANAPROX) 220 MG tablet Take 220 mg by mouth 2 (two) times daily as needed (pain).       Marland Kitchen oxyCODONE-acetaminophen (PERCOCET) 10-325 MG per tablet Take 1 tablet by mouth every 6 (six) hours.      . pramipexole (MIRAPEX) 0.5 MG tablet Take 0.5 mg by mouth at bedtime.       . pravastatin (PRAVACHOL) 80 MG tablet Take 80 mg by mouth every evening.         Blood pressure 136/62, pulse 87, temperature 96.6 F (35.9 C), temperature source Oral, resp. rate 14, height 5' 1.81" (1.57 m), weight 162 lb 6.4 oz (73.664 kg), SpO2 97.00%. Physical Exam: General: pleasant, WD/WN white female who is laying in bed in NAD HEENT: head is normocephalic, atraumatic.  Sclera are noninjected.  PERRL.  Ears and nose without any masses or lesions.  Mouth is pink and moist Heart: regular, rate, and rhythm.  No obvious murmurs, gallops, or rubs noted.  Palpable pedal pulses bilaterally Lungs: CTAB, no wheezes, rhonchi, or rales noted.  Respiratory effort nonlabored Abd: soft, NT/ND, +BS, no masses, hernias, or organomegaly, 2 scars (one supraumbilical, and one infraumbilical) Buttock: decubitus ulcer - 2cm wide with 1cm deep, no significant slough, wound bed is clean and pink, some minimal undermining noted, tape adhesive injury noted on the right buttock and superior to the decub, no surrounding cellulitis MS: all 4 extremities are symmetrical with no cyanosis, clubbing, or edema. Skin: warm and dry with no masses, lesions, or rashes Psych: A&Ox3 with an appropriate affect. Neuro: left sided weakness compared to right and some  dysarthria      Results for orders placed during the hospital encounter of 03/19/14 (from the past 48 hour(s))  CBC WITH DIFFERENTIAL     Status: Abnormal   Collection Time    03/19/14  7:35 PM      Result Value Ref Range   WBC 12.1 (*) 4.0 - 10.5 K/uL   RBC 3.42 (*) 3.87 - 5.11 MIL/uL   Hemoglobin 10.7 (*) 12.0 - 15.0 g/dL   HCT 31.9 (*) 36.0 - 46.0 %   MCV 93.3  78.0 - 100.0 fL   MCH 31.3  26.0 -  34.0 pg   MCHC 33.5  30.0 - 36.0 g/dL   RDW 13.6  11.5 - 15.5 %   Platelets 233  150 - 400 K/uL   Neutrophils Relative % 80 (*) 43 - 77 %   Neutro Abs 9.7 (*) 1.7 - 7.7 K/uL   Lymphocytes Relative 13  12 - 46 %   Lymphs Abs 1.5  0.7 - 4.0 K/uL   Monocytes Relative 6  3 - 12 %   Monocytes Absolute 0.7  0.1 - 1.0 K/uL   Eosinophils Relative 1  0 - 5 %   Eosinophils Absolute 0.1  0.0 - 0.7 K/uL   Basophils Relative 0  0 - 1 %   Basophils Absolute 0.0  0.0 - 0.1 K/uL  COMPREHENSIVE METABOLIC PANEL     Status: Abnormal   Collection Time    03/19/14  7:35 PM      Result Value Ref Range   Sodium 138  137 - 147 mEq/L   Potassium 4.6  3.7 - 5.3 mEq/L   Chloride 99  96 - 112 mEq/L   CO2 24  19 - 32 mEq/L   Glucose, Bld 103 (*) 70 - 99 mg/dL   BUN 26 (*) 6 - 23 mg/dL   Creatinine, Ser 1.05  0.50 - 1.10 mg/dL   Calcium 10.0  8.4 - 10.5 mg/dL   Total Protein 7.4  6.0 - 8.3 g/dL   Albumin 3.5  3.5 - 5.2 g/dL   AST 34  0 - 37 U/L   ALT 21  0 - 35 U/L   Alkaline Phosphatase 127 (*) 39 - 117 U/L   Total Bilirubin 0.4  0.3 - 1.2 mg/dL   GFR calc non Af Amer 49 (*) >90 mL/min   GFR calc Af Amer 57 (*) >90 mL/min   Comment: (NOTE)     The eGFR has been calculated using the CKD EPI equation.     This calculation has not been validated in all clinical situations.     eGFR's persistently <90 mL/min signify possible Chronic Kidney     Disease.   Anion gap 15  5 - 15  LIPASE, BLOOD     Status: None   Collection Time    03/19/14  7:35 PM      Result Value Ref Range   Lipase 17  11 - 59  U/L  URINALYSIS, ROUTINE W REFLEX MICROSCOPIC     Status: None   Collection Time    03/19/14  8:29 PM      Result Value Ref Range   Color, Urine YELLOW  YELLOW   APPearance CLEAR  CLEAR   Specific Gravity, Urine 1.013  1.005 - 1.030   pH 7.5  5.0 - 8.0   Glucose, UA NEGATIVE  NEGATIVE mg/dL   Hgb urine dipstick NEGATIVE  NEGATIVE   Bilirubin Urine NEGATIVE  NEGATIVE   Ketones, ur NEGATIVE  NEGATIVE mg/dL   Protein, ur NEGATIVE  NEGATIVE mg/dL   Urobilinogen, UA 0.2  0.0 - 1.0 mg/dL   Nitrite NEGATIVE  NEGATIVE   Leukocytes, UA NEGATIVE  NEGATIVE   Comment: MICROSCOPIC NOT DONE ON URINES WITH NEGATIVE PROTEIN, BLOOD, LEUKOCYTES, NITRITE, OR GLUCOSE <1000 mg/dL.  GLUCOSE, CAPILLARY     Status: Abnormal   Collection Time    03/20/14  3:27 AM      Result Value Ref Range   Glucose-Capillary 117 (*) 70 - 99 mg/dL  COMPREHENSIVE METABOLIC PANEL     Status: Abnormal  Collection Time    03/20/14  6:11 AM      Result Value Ref Range   Sodium 137  137 - 147 mEq/L   Potassium 4.2  3.7 - 5.3 mEq/L   Chloride 98  96 - 112 mEq/L   CO2 26  19 - 32 mEq/L   Glucose, Bld 113 (*) 70 - 99 mg/dL   BUN 24 (*) 6 - 23 mg/dL   Creatinine, Ser 0.83  0.50 - 1.10 mg/dL   Calcium 9.4  8.4 - 10.5 mg/dL   Total Protein 7.0  6.0 - 8.3 g/dL   Albumin 3.1 (*) 3.5 - 5.2 g/dL   AST 29  0 - 37 U/L   ALT 19  0 - 35 U/L   Alkaline Phosphatase 119 (*) 39 - 117 U/L   Total Bilirubin 0.4  0.3 - 1.2 mg/dL   GFR calc non Af Amer 65 (*) >90 mL/min   GFR calc Af Amer 75 (*) >90 mL/min   Comment: (NOTE)     The eGFR has been calculated using the CKD EPI equation.     This calculation has not been validated in all clinical situations.     eGFR's persistently <90 mL/min signify possible Chronic Kidney     Disease.   Anion gap 13  5 - 15  CBC WITH DIFFERENTIAL     Status: Abnormal   Collection Time    03/20/14  6:11 AM      Result Value Ref Range   WBC 10.8 (*) 4.0 - 10.5 K/uL   RBC 3.46 (*) 3.87 - 5.11 MIL/uL     Hemoglobin 10.7 (*) 12.0 - 15.0 g/dL   HCT 32.9 (*) 36.0 - 46.0 %   MCV 95.1  78.0 - 100.0 fL   MCH 30.9  26.0 - 34.0 pg   MCHC 32.5  30.0 - 36.0 g/dL   RDW 13.8  11.5 - 15.5 %   Platelets 218  150 - 400 K/uL   Neutrophils Relative % 85 (*) 43 - 77 %   Neutro Abs 9.2 (*) 1.7 - 7.7 K/uL   Lymphocytes Relative 8 (*) 12 - 46 %   Lymphs Abs 0.9  0.7 - 4.0 K/uL   Monocytes Relative 7  3 - 12 %   Monocytes Absolute 0.7  0.1 - 1.0 K/uL   Eosinophils Relative 0  0 - 5 %   Eosinophils Absolute 0.0  0.0 - 0.7 K/uL   Basophils Relative 0  0 - 1 %   Basophils Absolute 0.0  0.0 - 0.1 K/uL   Ct Abdomen Pelvis W Contrast  03/19/2014   CLINICAL DATA:  Abdominal pain, low back pain, nausea.  EXAM: CT ABDOMEN AND PELVIS WITH CONTRAST  TECHNIQUE: Multidetector CT imaging of the abdomen and pelvis was performed using the standard protocol following bolus administration of intravenous contrast.  CONTRAST:  168m OMNIPAQUE IOHEXOL 300 MG/ML  SOLN  COMPARISON:  None.  FINDINGS: Dependent atelectasis in the lung bases. Densely calcified coronary arteries. No effusions. Heart is upper limits normal in size. Oral contrast material is seen within the esophagus which may be related to dysmotility or reflux.  Prior cholecystectomy. Common bile duct is dilated, likely related to post cholecystectomy state. No focal abnormality within the liver, spleen, pancreas, adrenals. Areas of atrophy and scarring in the posterior upper and mid poles of the right kidney. Given the heavily calcified aorta and renal arteries, this is likely from chronic renovascular disease. Small cyst in  the midpole of the left kidney. No hydronephrosis.  Scattered colonic diverticulosis. Large stool burden throughout the colon. Small bowel is decompressed. Stomach is unremarkable.  Gas noted within the endometrial canal of the uterus. The sigmoid colon is contiguous with the fundus of the uterus and there are several diverticula in the region, but no  inflammatory process or visible fistula, but fistula is a concern if the patient has not had recent instrumentation.  Gas noted within the soft tissues extending from the skin surface in the midline of the buttocks deep to near the coccyx compatible with large decubitus ulcer.  There is also a gas collection noted to the left of the aortic bifurcation between the aortic bifurcation and the left psoas muscle in the left retroperitoneum (image 47). This is adjacent to a severely degenerated L4-5 disc with vacuum disc noted, but no definite connection to the disc space. This conceivably could represent a large herniated disc fragment, but other cause of gas cannot be excluded. Again, no significant inflammation surrounding this area.  IMPRESSION: Gas within the scratch head gas fills the endometrial canal of the uterus. The sigmoid colon is a medially contiguous to the uterus with diverticulosis, but no inflammatory process noted at this time. 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 Given the lack of inflammation, this may be a chronic longstanding process.  Large sacral decubitus ulcer. Gas extends down to the coccyx. No bony changes to suggest osteomyelitis.  Small gas collection in the left retroperitoneum near the aortic bifurcation of unknown etiology. Question large herniated disc fragment from the adjacent degenerated disc. Again, no inflammation in this area to suggest active inflammatory process or to suggest this is an abscess.  These results were called by telephone at the time of interpretation on 03/19/2014 at 9:55 PM to Dr. Sherwood Gambler , who verbally acknowledged these results.   Electronically Signed   By: Rolm Baptise M.D.   On: 03/19/2014 21:55      Assessment/Plan Sacral decubitus ulcer  Gas in endometrial canal with adjacent sigmoid colon? Colo-uterine fistula Chronic constipation Lower back pain CHF/HTN - per medicine H/o  CVA  Plan: 1.  Sacral decub does not require surgery, but would benefit from [place], WD gauze packing or potentially wound vac.  Skin irritation from tape noted and may require WOC's interventions.  She can continue to be followed by the wound center. 2.  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  It would be rare to have a connection to the uterus, more commonly vagina after hysterectomy.  She is not symptomatic (no air or drainage from vagina), leukocytosis is minimal, afebrile, and no abdominal pain.  This may be chronic, but she tells me she's never had diverticulitis before.  This can be further worked up by GI and surgery as an outpatient.  Don't see a need for antibiotics from our perspective. 3.  NPO, bowel rest, IVF, pain control, antiemetics 4.  SCD's and lovenox for DVT proph 5.  Mobilize- may need PT/OT and IS 6.  May benefit from bowel regimen   Coralie Keens, [facility] 03/20/2014, 7:39 AM Pager: [phone]

## 2014-03-20 NOTE — Progress Notes (Signed)
Patient moaning in pain.  Family requesting pain med for patient;say they want her to be comfortable.  Patient was medicated

## 2014-03-20 NOTE — Progress Notes (Addendum)
Patient Demographics  [name], is a 80 y.o. female, DOB - [birth year], ZOX:[id number]  Admit date - 03/19/2014   Admitting Physician Eduard ClosArshad N Kakrakandy, MD  Outpatient Primary MD for the patient is Astrid DivineGRIFFIN,ELAINE COLLINS, MD  LOS - 1   Chief Complaint  Patient presents with  . Back Pain       Subjective:   Brooksie Eick today has, No headache, No chest pain, No abdominal pain - No Nausea, No new weakness tingling or numbness, No Cough - SOB. 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 Surgery following. For now we'll continue admission antibiotics, pain control and defer further care to surgery.  MRI to look at the L-spine to rule out disc compression as etiology for back pain.    2. History of hypertension and chronic combined systolic and diastolic CHF EF 81%45%. Currently compensated from CHF standpoint no acute, continue beta blocker fluid and salt restriction.    3. Anemia of chronic disease. Supportive care and monitor.     Code Status: DNR D/W daughter  Family Communication: Daughter x 2  Disposition Plan: To be decided   Procedures CT scan abdomen pelvis   Consults [facility]   Medications  Scheduled Meds: . ertapenem  1 g Intravenous Q24H  . heparin  5,000 Units Subcutaneous 3 times per day  . HYDROmorphone PCA 0.3 mg/mL   Intravenous 6 times per day  . metoprolol  2.5 mg Intravenous 4 times per day  . vancomycin  1,000 mg Intravenous Q24H  . white petrolatum       Continuous Infusions: . sodium chloride 10 mL/hr at 03/20/14  0307   PRN Meds:.acetaminophen, acetaminophen, diclofenac sodium, hydrALAZINE, naloxone, ondansetron (ZOFRAN) IV, ondansetron (ZOFRAN) IV, ondansetron, sodium chloride  DVT Prophylaxis    Heparin   Lab Results  Component Value Date   PLT 218 03/20/2014    Antibiotics     Anti-infectives   Start     Dose/Rate Route Frequency Ordered Stop   03/20/14 2200  ertapenem (INVANZ) 1 g in sodium chloride 0.9 % 50 mL IVPB     1 g 100 mL/hr over 30 Minutes Intravenous Every 24 hours 03/20/14 0255     03/20/14 2200  vancomycin (VANCOCIN) IVPB 1000 mg/200 mL premix     1,000 mg 200 mL/hr over 60 Minutes Intravenous Every 24 hours 03/20/14 0321     03/19/14 2300  ertapenem (INVANZ) 1 g in sodium chloride 0.9 % 50 mL IVPB     1 g 100 mL/hr over 30 Minutes Intravenous  Once 03/19/14 2248 03/20/14 0106   03/19/14 2245  vancomycin (VANCOCIN) IVPB 1000 mg/200 mL premix     1,000 mg 200 mL/hr over 60 Minutes Intravenous  Once 03/19/14 2232 03/20/14 0014          Objective:   Filed Vitals:   03/20/14 0535 03/20/14 0654 03/20/14 0830 03/20/14 1021  BP: 174/75 136/62    Pulse: 87   87  Temp:  96.6 F (35.9 C)   98.1 F (36.7 C)  TempSrc: Oral   Oral  Resp: 14  14 15   Height:      Weight:      SpO2: 97%  95% 96%    Wt Readings from Last 3 Encounters:  03/20/14 73.664 kg (162 lb 6.4 oz)  03/13/14 75.751 kg (167 lb)  11/18/13 73.936 kg (163 lb)     Intake/Output Summary (Last 24 hours) at 03/20/14 1104 Last data filed at 03/20/14 0938  Gross per 24 hour  Intake    380 ml  Output      0 ml  Net    380 ml     Physical Exam  Awake Alert, Oriented X 3, No new F.N deficits, Normal affect Bent.AT,PERRAL Supple Neck,No JVD, No cervical lymphadenopathy appriciated.  Symmetrical Chest wall movement, Good air movement bilaterally, CTAB RRR,No Gallops,Rubs or new Murmurs, No Parasternal Heave +ve B.Sounds, Abd Soft, No tenderness, No organomegaly appriciated, No rebound - guarding or  rigidity. No Cyanosis, Clubbing or edema, No new Rash or bruise      Data Review   Micro Results No results found for this or any previous visit (from the past 240 hour(s)).  Radiology Reports   Ct Abdomen Pelvis W Contrast  03/19/2014   CLINICAL DATA:  Abdominal pain, low back pain, nausea.  EXAM: CT ABDOMEN AND PELVIS WITH CONTRAST  TECHNIQUE: Multidetector CT imaging of the abdomen and pelvis was performed using the standard protocol following bolus administration of intravenous contrast.  CONTRAST:  100mL OMNIPAQUE IOHEXOL 300 MG/ML  SOLN  COMPARISON:  None.  FINDINGS: Dependent atelectasis in the lung bases. Densely calcified coronary arteries. No effusions. Heart is upper limits normal in size. Oral contrast material is seen within the esophagus which may be related to dysmotility or reflux.  Prior cholecystectomy. Common bile duct is dilated, likely related to post cholecystectomy state. No focal abnormality within the liver, spleen, pancreas, adrenals. Areas of atrophy and scarring in the posterior upper and mid poles of the right kidney. Given the heavily calcified aorta and renal arteries, this is likely from chronic renovascular disease. Small cyst in the midpole of the left kidney. No hydronephrosis.  Scattered colonic diverticulosis. Large stool burden throughout the colon. Small bowel is decompressed. Stomach is unremarkable.  Gas noted within the endometrial canal of the uterus. The sigmoid colon is contiguous with the fundus of the uterus and there are several diverticula in the region, but no inflammatory process or visible fistula, but fistula is a concern if the patient has not had recent instrumentation.  Gas noted within the soft tissues extending from the skin surface in the midline of the buttocks deep to near the coccyx compatible with large decubitus ulcer.  There is also a gas collection noted to the left of the aortic bifurcation between the aortic bifurcation and the left  psoas muscle in the left retroperitoneum (image 47). This is adjacent to a severely degenerated L4-5 disc with vacuum disc noted, but no definite connection to the disc space. This conceivably could represent a large herniated disc fragment, but other cause of gas cannot be excluded. Again, no significant inflammation surrounding this area.  IMPRESSION: Gas within the scratch head gas fills the endometrial canal of the uterus. The sigmoid colon is a medially contiguous to the uterus with diverticulosis, but no inflammatory process noted at this time. 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  Given the lack of inflammation, this may be a chronic longstanding process.  Large sacral decubitus ulcer. Gas extends down to the coccyx. No bony changes to suggest osteomyelitis.  Small gas collection in the left retroperitoneum near the aortic bifurcation of unknown etiology. Question large herniated disc fragment from the adjacent degenerated disc. Again, no inflammation in this area to suggest active inflammatory process or to suggest this is an abscess.  These results were called by telephone at the time of interpretation on 03/19/2014 at 9:55 PM to Dr. Pricilla Loveless , who verbally acknowledged these results.   Electronically Signed   By: Charlett Nose M.D.   On: 03/19/2014 21:55    CBC  Recent Labs Lab 03/13/14 1407 03/19/14 1935 03/20/14 0611  WBC 8.9 12.1* 10.8*  HGB 10.7* 10.7* 10.7*  HCT 32.5* 31.9* 32.9*  PLT 230 233 218  MCV 94.8 93.3 95.1  MCH 31.2 31.3 30.9  MCHC 32.9 33.5 32.5  RDW 13.5 13.6 13.8  LYMPHSABS 1.3 1.5 0.9  MONOABS 0.3 0.7 0.7  EOSABS 0.1 0.1 0.0  BASOSABS 0.0 0.0 0.0    Chemistries   Recent Labs Lab 03/13/14 1407 03/19/14 1935 03/20/14 0611  NA 141 138 137  K 4.1 4.6 4.2  CL 102 99 98  CO2 28 24 26   GLUCOSE 108* 103* 113*  BUN 19 26* 24*  CREATININE 0.77 1.05 0.83  CALCIUM 9.9 10.0 9.4  AST  --  34 29    ALT  --  21 19  ALKPHOS  --  127* 119*  BILITOT  --  0.4 0.4   ------------------------------------------------------------------------------------------------------------------ estimated creatinine clearance is 50.6 ml/min (by C-G formula based on Cr of 0.83). ------------------------------------------------------------------------------------------------------------------ No results found for this basename: HGBA1C,  in the last 72 hours ------------------------------------------------------------------------------------------------------------------ No results found for this basename: CHOL, HDL, LDLCALC, TRIG, CHOLHDL, LDLDIRECT,  in the last 72 hours ------------------------------------------------------------------------------------------------------------------ No results found for this basename: TSH, T4TOTAL, FREET3, T3FREE, THYROIDAB,  in the last 72 hours ------------------------------------------------------------------------------------------------------------------ No results found for this basename: VITAMINB12, FOLATE, FERRITIN, TIBC, IRON, RETICCTPCT,  in the last 72 hours  Coagulation profile No results found for this basename: INR, PROTIME,  in the last 168 hours  No results found for this basename: DDIMER,  in the last 72 hours  Cardiac Enzymes No results found for this basename: CK, CKMB, TROPONINI, MYOGLOBIN,  in the last 168 hours ------------------------------------------------------------------------------------------------------------------ No components found with this basename: POCBNP,      Time Spent in minutes 35   SINGH,PRASHANT K M.D on 03/20/2014 at 11:04 AM  Between 7am to 7pm - Pager - [phone]  After 7pm go to [email or web address] - password [email or web address]  And look for the night coverage person covering for me after hours  [facility] Office  [phone]   **Disclaimer: This note may have been dictated with voice recognition software.  Similar sounding words can inadvertently be transcribed and this note may contain transcription errors which may not have been corrected upon publication of note.**

## 2014-03-20 NOTE — Progress Notes (Signed)
Patient was lethagic and diaphoretic, vital signs: 97.6 80/47 103 11 94% 2L. Notified Dr. Burney GauzeP Singh received orders to give pt 500cc NS bolus, discontinue PCA diluadid, give narcan, and obtain stat ABG.  Rapid Response nurse  was called and came to pt's bedside to access. Respiratory was also called and came to patient's bedside for ABG. After pt was given bolus  Vital signs were 98.3 112/63 120  12 97% 3L, patient still lethagic. Dr. Thedore MinsSingh was notified of patient's vital signs after bolus and that patient was still lethagic. Dr. Thedore MinsSingh ordered for another dose of Narcan.   Continued to monitor patient.

## 2014-03-20 NOTE — Consult Note (Signed)
Agree with above.  No clinical evidence for fistula from colon to her uterus.  Would doubt this is source of her back pain.  Consider colonoscopy as outpatient.  Perhaps a thorough GYN examination would also be helpful.  No acute indications for intervention.  Decubitus - WOCN; no need for surgical debridement at this time.  Wilmon ArmsMatthew K. Corliss Skainssuei, MD, [facility]  General/ Trauma Surgery  03/20/2014 10:41 AM

## 2014-03-21 ENCOUNTER — Inpatient Hospital Stay (HOSPITAL_COMMUNITY): Payer: Medicare Other

## 2014-03-21 ENCOUNTER — Encounter (HOSPITAL_COMMUNITY): Payer: Self-pay | Admitting: General Practice

## 2014-03-21 DIAGNOSIS — Z515 Encounter for palliative care: Secondary | ICD-10-CM

## 2014-03-21 DIAGNOSIS — R5381 Other malaise: Secondary | ICD-10-CM

## 2014-03-21 DIAGNOSIS — R5383 Other fatigue: Secondary | ICD-10-CM

## 2014-03-21 DIAGNOSIS — M545 Low back pain, unspecified: Secondary | ICD-10-CM

## 2014-03-21 LAB — GLUCOSE, CAPILLARY
GLUCOSE-CAPILLARY: 108 mg/dL — AB (ref 70–99)
GLUCOSE-CAPILLARY: 68 mg/dL — AB (ref 70–99)
GLUCOSE-CAPILLARY: 96 mg/dL (ref 70–99)
GLUCOSE-CAPILLARY: 123 mg/dL — AB (ref 70–99)
Glucose-Capillary: 100 mg/dL — ABNORMAL HIGH (ref 70–99)
Glucose-Capillary: 110 mg/dL — ABNORMAL HIGH (ref 70–99)
Glucose-Capillary: 76 mg/dL (ref 70–99)

## 2014-03-21 LAB — CBC
HCT: 30.7 % — ABNORMAL LOW (ref 36.0–46.0)
Hemoglobin: 10.1 g/dL — ABNORMAL LOW (ref 12.0–15.0)
MCH: 31.6 pg (ref 26.0–34.0)
MCHC: 32.9 g/dL (ref 30.0–36.0)
MCV: 95.9 fL (ref 78.0–100.0)
PLATELETS: ADEQUATE 10*3/uL (ref 150–400)
RBC: 3.2 MIL/uL — AB (ref 3.87–5.11)
RDW: 13.8 % (ref 11.5–15.5)
WBC: 16.1 10*3/uL — ABNORMAL HIGH (ref 4.0–10.5)

## 2014-03-21 LAB — BASIC METABOLIC PANEL
ANION GAP: 15 (ref 5–15)
BUN: 41 mg/dL — ABNORMAL HIGH (ref 6–23)
CO2: 25 meq/L (ref 19–32)
Calcium: 9.1 mg/dL (ref 8.4–10.5)
Chloride: 98 mEq/L (ref 96–112)
Creatinine, Ser: 1.49 mg/dL — ABNORMAL HIGH (ref 0.50–1.10)
GFR calc Af Amer: 37 mL/min — ABNORMAL LOW (ref >90)
GFR, EST NON AFRICAN AMERICAN: 32 mL/min — AB (ref >90)
Glucose, Bld: 102 mg/dL — ABNORMAL HIGH (ref 70–99)
Potassium: 4.6 mEq/L (ref 3.7–5.3)
SODIUM: 138 meq/L (ref 137–147)

## 2014-03-21 MED ORDER — ENSURE PUDDING PO PUDG
1.0000 | Freq: Two times a day (BID) | ORAL | Status: DC
  Administered 2014-03-21 – 2014-03-27 (×11): 1 via ORAL

## 2014-03-21 MED ORDER — BOOST PLUS PO LIQD
237.0000 mL | ORAL | Status: DC
  Administered 2014-03-23 – 2014-03-26 (×3): 237 mL via ORAL
  Filled 2014-03-21: qty 237
  Filled 2014-03-21: qty 1
  Filled 2014-03-21 (×3): qty 237
  Filled 2014-03-21: qty 1
  Filled 2014-03-21 (×2): qty 237
  Filled 2014-03-21: qty 1
  Filled 2014-03-21: qty 237

## 2014-03-21 MED ORDER — VANCOMYCIN HCL IN DEXTROSE 750-5 MG/150ML-% IV SOLN
750.0000 mg | INTRAVENOUS | Status: DC
  Administered 2014-03-22 – 2014-03-25 (×4): 750 mg via INTRAVENOUS
  Filled 2014-03-21 (×4): qty 150

## 2014-03-21 MED ORDER — MORPHINE SULFATE ER 30 MG PO TBCR: 60.0000 mg | EXTENDED_RELEASE_TABLET | Freq: Two times a day (BID) | ORAL | Status: DC

## 2014-03-21 MED ORDER — SODIUM CHLORIDE 0.9 % IV SOLN
500.0000 mg | Freq: Two times a day (BID) | INTRAVENOUS | Status: DC
  Administered 2014-03-22 – 2014-03-25 (×7): 500 mg via INTRAVENOUS
  Filled 2014-03-21 (×8): qty 0.5

## 2014-03-21 MED ORDER — LORAZEPAM 2 MG/ML IJ SOLN
1.0000 mg | Freq: Once | INTRAMUSCULAR | Status: AC | PRN
  Administered 2014-03-21: 1 mg via INTRAVENOUS
  Filled 2014-03-21: qty 1

## 2014-03-21 MED ORDER — SODIUM CHLORIDE 0.9 % IV SOLN
INTRAVENOUS | Status: DC
  Administered 2014-03-21: 18:00:00 via INTRAVENOUS

## 2014-03-21 MED ORDER — IOHEXOL 350 MG/ML SOLN
100.0000 mL | Freq: Once | INTRAVENOUS | Status: AC | PRN
  Administered 2014-03-21: 100 mL via INTRAVENOUS

## 2014-03-21 MED ORDER — MORPHINE SULFATE ER 30 MG PO TBCR
30.0000 mg | EXTENDED_RELEASE_TABLET | Freq: Two times a day (BID) | ORAL | Status: DC
  Administered 2014-03-21 – 2014-03-27 (×13): 30 mg via ORAL
  Filled 2014-03-21 (×13): qty 1

## 2014-03-21 MED ORDER — GADOBENATE DIMEGLUMINE 529 MG/ML IV SOLN
7.0000 mL | Freq: Once | INTRAVENOUS | Status: AC | PRN
  Administered 2014-03-21: 7 mL via INTRAVENOUS

## 2014-03-21 MED ORDER — MORPHINE SULFATE 4 MG/ML IJ SOLN
4.0000 mg | INTRAMUSCULAR | Status: DC
  Administered 2014-03-21 – 2014-03-23 (×14): 4 mg via INTRAVENOUS
  Filled 2014-03-21 (×16): qty 1

## 2014-03-21 NOTE — Progress Notes (Signed)
ANTIBIOTIC CONSULT NOTE - INITIAL  Pharmacy Consult for vancomycin and merropenem Indication: discitis  Allergies  Allergen Reactions  . Penicillins Hives, Shortness Of Breath and Nausea Only    Throat closed up and had hives and nausea    Patient Measurements: Height: 5' 1.81" (157 cm) Weight: 162 lb 6.4 oz (73.664 kg) IBW/kg (Calculated) : 49.67 Adjusted Body Weight:   Vital Signs: Temp: 99.4 F (37.4 C) (07/07 1344) Temp src: Oral (07/07 1344) BP: 113/38 mmHg (07/07 1344) Pulse Rate: 77 (07/07 1344) Intake/Output from previous day: 07/06 0701 - 07/07 0700 In: 242.5 [P.O.:120; I.V.:122.5] Out: -  Intake/Output from this shift:    Labs:  Recent Labs  03/19/14 1935 03/20/14 0611 03/21/14 0701  WBC 12.1* 10.8* 16.1*  HGB 10.7* 10.7* 10.1*  PLT 233 218 PLATELET CLUMPS NOTED ON SMEAR, COUNT APPEARS ADEQUATE  CREATININE 1.05 0.83 1.49*   Estimated Creatinine Clearance: 28.2 ml/min (by C-G formula based on Cr of 1.49). No results found for this basename: VANCOTROUGH, VANCOPEAK, VANCORANDOM, GENTTROUGH, GENTPEAK, GENTRANDOM, TOBRATROUGH, TOBRAPEAK, TOBRARND, AMIKACINPEAK, AMIKACINTROU, AMIKACIN,  in the last 72 hours   Microbiology: No results found for this or any previous visit (from the past 720 hour(s)).  Medical History: Past Medical History  Diagnosis Date  . CHF (congestive heart failure)   . Coronary artery disease   . Hypertension   . Arthritis   . Edema of extremities   . Anxiety   . HLD (hyperlipidemia)   . Peripheral vascular disease   . Stroke Jan 2015    left sided weakness, trouble speaking  . Cataracts, bilateral   . Detached retina     right eye    Medications:  Prescriptions prior to admission  Medication Sig Dispense Refill  . ALPRAZolam (XANAX) 0.25 MG tablet Take 0.25 mg by mouth at bedtime as needed for anxiety.      Marland Kitchen. aspirin 325 MG tablet Take 325 mg by mouth daily.      . Calcium Carb-Cholecalciferol (CALCIUM 600 + D PO) Take 1  tablet by mouth daily.      . diclofenac sodium (VOLTAREN) 1 % GEL Apply 2 g topically 2 (two) times daily as needed (pain).      . furosemide (LASIX) 20 MG tablet Take 20 mg by mouth daily.      Marland Kitchen. losartan (COZAAR) 50 MG tablet Take 50 mg by mouth daily.      . Menthol, Topical Analgesic, (BIOFREEZE EX) Apply 1 application topically daily as needed (pain).      . methocarbamol (ROBAXIN) 500 MG tablet Take 500 mg by mouth every 6 (six) hours as needed for muscle spasms.       . metoprolol (LOPRESSOR) 50 MG tablet Take 50 mg by mouth 2 (two) times daily.      . Multiple Vitamin (MULTIVITAMIN WITH MINERALS) TABS Take 1 tablet by mouth daily.      . naproxen sodium (ANAPROX) 220 MG tablet Take 220 mg by mouth 2 (two) times daily as needed (pain).       Marland Kitchen. oxyCODONE-acetaminophen (PERCOCET) 10-325 MG per tablet Take 1 tablet by mouth every 6 (six) hours.      . pramipexole (MIRAPEX) 0.5 MG tablet Take 0.5 mg by mouth at bedtime.       . pravastatin (PRAVACHOL) 80 MG tablet Take 80 mg by mouth every evening.         Assessment: 80 yo female with low back pain secondary to stage IV decub ulcer and  possible discitis.  Initially started on vancomycin and ertapenem, now transitioning to vancomycin and meropenem.  Pt has bump in Scr to 1.49 with baseline Scr < 1.  Leukocytosis to 16, afebrile.     Goal of Therapy:  Vancomycin trough level 15-20 mcg/ml   Plan:  -Vancomycin 750 mg IV q24h -Meropenem 500 mg IV q12h -F/u duration of antibiotics/goals of care -Monitor renal function -Vanc trough as indicated    Agapito GamesAlison Yosiel Thieme, PharmD, BCPS Clinical Pharmacist Pager: [phone] 03/21/2014 4:31 PM

## 2014-03-21 NOTE — Consult Note (Signed)
Patient [name]      DOB: [birth year]      NFA:[id number]     Consult Note from the Palliative Medicine Team at [facility]    Consult Requested by: Dr Thedore Mins     PCP: Toni Divine, MD Reason for Consultation:  Clarification of GOC and options    Phone Number:[phone]  Assessment of patients Current state:  Continued physical, functional and cognitive decline over the past 6 months  Pateitn and family faced with advanced directive and anticipatory care needs  Consult is for review of medical treatment options, clarification of goals of care and end of life issues, disposition and options, and symptom recommendation.  This NP Lorinda Creed reviewed medical records, received report from team, assessed the patient and then meet at the patient's bedside along with her four children  [name]  And her three siblings to discuss diagnosis prognosis, GOC, EOL wishes disposition and options.  A detailed discussion was had today regarding advanced directives.  Concepts specific to code status, artifical feeding and hydration, continued IV antibiotics and rehospitalization was had.  The difference between a aggressive medical intervention path  and a palliative comfort care path for this patient at this time was had.  Values and goals of care important to patient and family were attempted to be elicited.  Concept of Hospice and Palliative Care were discussed  Natural trajectory and expectations at EOL were discussed.  Questions and concerns addressed.  Hard Choices booklet left for review. Family encouraged to call with questions or concerns.  PMT will continue to support holistically.   Goals of Care: 1.  Code Status:  DNR/DNI   2. Scope of Treatment: 1. Vital Signs: per unit  2. Respiratory/Oxygen: yes 3. Nutritional Support/Tube Feeds:no artifical feeding now or in the future 4. Antibiotics: yes 5. Blood Products:yes 6. IVF:yes 7. Labs/Diagnostics: Open to MRI as  discussed with attending 8. Telemetry:yes   3. Disposition:  dependant on outcomes and medical decisions over the next few days   4. Symptom Management:   1.  Weakness: PT/OT evaluation and possible  SNF for rehabilitation at discharge   5. Psychosocial:  Emotional support offered to family.  All express focus of care is comfort for this patient.   Patient Documents Completed or Given: Document Given Completed  Advanced Directives Pkt    MOST yes   DNR    Gone from My Sight    Hard Choices yes     Brief HPI: [name] is a 80 y.o. female with history of sacral decubitus ulcer, progressive quadriparesis with history of cervical fusion surgery, CHF, CVA, CAD admitted with increasing low back pain over the last few days. Patient's pain is mostly of low back around the sacral area where patient has a stage IV decubitus ulcer   Patient has been having progressive weakness of her extremities particularly lower and has been followed by neurosurgeon Dr. Danielle Diaz.   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 Patient  started on empiric antibiotics.      ROS: weakness, intermittent back pain   PMH:  Past Medical History  Diagnosis Date  . CHF (congestive heart failure)   . Coronary artery disease   . Hypertension   . Arthritis   . Edema of extremities   . Anxiety   . HLD (hyperlipidemia)   . Peripheral vascular disease   .  Stroke Jan 2015    left sided weakness, trouble speaking  . Cataracts, bilateral   . Detached retina     right eye     PSH: Past Surgical History  Procedure Laterality Date  . Total knee arthroplasty Bilateral     knee replacements  . Lumbar disc surgery    . Cholecystectomy    . Posterior cervical fusion/foraminotomy N/A 12/28/2012    Procedure: POSTERIOR CERVICAL FUSION/FORAMINOTOMY LEVEL 3;  Surgeon: Barnett AbuHenry Elsner,  MD;  Location: [facility];  Service: Neurosurgery;  Laterality: N/A;  Posterior Cervical Three-Six Laminectomy and Fusion  . Angioplasty    . Tonsillectomy    . Joint replacement    . Tubal ligation    . Retinal detachment surgery      2x - right eye   I have reviewed the FH and SH and  If appropriate update it with new information. Allergies  Allergen Reactions  . Penicillins Hives, Shortness Of Breath and Nausea Only    Throat closed up and had hives and nausea   Scheduled Meds: . feeding supplement (ENSURE)  1 Container Oral BID BM  . heparin  5,000 Units Subcutaneous 3 times per day  . lactose free nutrition  237 mL Oral Q24H  . metoprolol  2.5 mg Intravenous 4 times per day  . morphine  30 mg Oral Q12H  .  morphine injection  4 mg Intravenous Q2H   Continuous Infusions: . sodium chloride 10 mL/hr at 03/20/14 0307   PRN Meds:.diclofenac sodium, hydrALAZINE, naLOXone (NARCAN)  injection, ondansetron (ZOFRAN) IV, sodium chloride    BP 113/38  Pulse 77  Temp(Src) 99.4 F (37.4 C) (Oral)  Resp 18  Ht 5' 1.81" (1.57 m)  Wt 73.664 kg (162 lb 6.4 oz)  BMI 29.89 kg/m2  SpO2 100%   PPS:  30 % at best   Intake/Output Summary (Last 24 hours) at 03/21/14 1624 Last data filed at 03/20/14 1800  Gross per 24 hour  Intake  122.5 ml  Output      0 ml  Net  122.5 ml     Physical Exam:  General: chronically ill appearing, NAD HEENT:  Moist buccal membranes, no exudate Chest:   Decreased in bases  CTA CVS: RRR Abdomen:soft NT +BS Skin: noted sacral decubitus, pale Ext: without edema Neuro: moves all four extremities, equal strength bilaterally  Labs: CBC    Component Value Date/Time   WBC 16.1* 03/21/2014 0701   RBC 3.20* 03/21/2014 0701   HGB 10.1* 03/21/2014 0701   HCT 30.7* 03/21/2014 0701   PLT PLATELET CLUMPS NOTED ON SMEAR, COUNT APPEARS ADEQUATE 03/21/2014 0701   MCV 95.9 03/21/2014 0701   MCH 31.6 03/21/2014 0701   MCHC 32.9 03/21/2014 0701   RDW 13.8 03/21/2014 0701    LYMPHSABS 0.9 03/20/2014 0611   MONOABS 0.7 03/20/2014 0611   EOSABS 0.0 03/20/2014 0611   BASOSABS 0.0 03/20/2014 0611    BMET    Component Value Date/Time   NA 138 03/21/2014 0701   K 4.6 03/21/2014 0701   CL 98 03/21/2014 0701   CO2 25 03/21/2014 0701   GLUCOSE 102* 03/21/2014 0701   BUN 41* 03/21/2014 0701   CREATININE 1.49* 03/21/2014 0701   CALCIUM 9.1 03/21/2014 0701   GFRNONAA 32* 03/21/2014 0701   GFRAA 37* 03/21/2014 0701    CMP     Component Value Date/Time   NA 138 03/21/2014 0701   K 4.6 03/21/2014 0701   CL 98 03/21/2014  0701   CO2 25 03/21/2014 0701   GLUCOSE 102* 03/21/2014 0701   BUN 41* 03/21/2014 0701   CREATININE 1.49* 03/21/2014 0701   CALCIUM 9.1 03/21/2014 0701   PROT 7.0 03/20/2014 0611   ALBUMIN 3.1* 03/20/2014 0611   AST 29 03/20/2014 0611   ALT 19 03/20/2014 0611   ALKPHOS 119* 03/20/2014 0611   BILITOT 0.4 03/20/2014 0611   GFRNONAA 32* 03/21/2014 0701   GFRAA 37* 03/21/2014 0701     Time In Time Out Total Time Spent with Patient Total Overall Time  1600 1730 80 min 90 min    Greater than 50%  of this time was spent counseling and coordinating care related to the above assessment and plan.   Lorinda CreedMary Cataleia Gade NP  Palliative Medicine Team Team Phone # [phone] Pager [phone]  Discussed with Dr Thedore MinsSingh

## 2014-03-21 NOTE — Progress Notes (Signed)
No clinical signs of colouterine fistula Ulcer - no indications for debridement  Call if there is anything we can do to help in her care.  Wilmon ArmsMatthew K. Corliss Skainssuei, MD, [facility]  General/ Trauma Surgery  03/21/2014 1:49 PM

## 2014-03-21 NOTE — Progress Notes (Signed)
INITIAL NUTRITION ASSESSMENT  DOCUMENTATION CODES Per approved criteria  -Not Applicable   INTERVENTION: Provide Ensure Pudding BID in between meals Provide Magic cup with lunch and dinner Provide Boost Plus once daily  NUTRITION DIAGNOSIS: Inadequate oral intake related to poor appetite as evidenced by family report and 3% weight loss in one week.   Goal: Pt to meet >/= 90% of their estimated nutrition needs  Monitor:  PO intake, weight trend, labs, goals of care  Reason for Assessment: Low Braden  80 y.o. female  Admitting Dx: Sacral decubitus ulcer  ASSESSMENT: 80 y.o. female with history of sacral decubitus ulcer, progressive quadriparesis with history of cervical fusion surgery, CHF, CVA, CAD presents with increasing low back pain over the last few days. Patient's pain is mostly of low back around the sacral area where patient has a stage IV decubitus ulcer. 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   Pt being fed jello by family at time of visit. They report that patient was eating well and drinking one Boost High protein daily until about one week ago when patient's pain got worse and she stopped eating. They report that last patient ate was a couple bites of pancakes on Sunday but, pt is eating and drinking more today. Family requesting Ensure pudding as well as other nutritional supplements that patient can eat/drink. Weight history shows pt has lost 5 lbs in the past week.  Per MD note, pt would like to become hospice. Palliative has been consulted.   Height: Ht Readings from Last 1 Encounters:  03/20/14 5' 1.81" (1.57 m)    Weight: Wt Readings from Last 1 Encounters:  03/20/14 162 lb 6.4 oz (73.664 kg)    Ideal Body Weight: 110 lbs  % Ideal Body Weight: 147%  Wt Readings from Last 10 Encounters:  03/20/14 162 lb 6.4 oz (73.664 kg)   03/13/14 167 lb (75.751 kg)  11/18/13 163 lb (73.936 kg)  11/18/13 163 lb (73.936 kg)  11/03/13 157 lb (71.215 kg)  10/12/13 157 lb (71.215 kg)  10/05/13 162 lb 7.7 oz (73.7 kg)  03/14/13 162 lb 4 oz (73.596 kg)  12/28/12 181 lb 14.1 oz (82.5 kg)  12/28/12 181 lb 14.1 oz (82.5 kg)    Usual Body Weight: 167 lbs  % Usual Body Weight: 97%  BMI:  Body mass index is 29.89 kg/(m^2).  Estimated Nutritional Needs: Kcal: 1500-1700 Protein: 90-100 grams Fluid: 1.5-1.7 L/day  Skin: stage 4 sacral pressure ulcer  Diet Order: Cardiac  EDUCATION NEEDS: -No education needs identified at this time   Intake/Output Summary (Last 24 hours) at 03/21/14 1333 Last data filed at 03/20/14 1800  Gross per 24 hour  Intake  242.5 ml  Output      0 ml  Net  242.5 ml    Last BM: PTA  Labs:   Recent Labs Lab 03/19/14 1935 03/20/14 0611 03/21/14 0701  NA 138 137 138  K 4.6 4.2 4.6  CL 99 98 98  CO2 24 26 25   BUN 26* 24* 41*  CREATININE 1.05 0.83 1.49*  CALCIUM 10.0 9.4 9.1  GLUCOSE 103* 113* 102*    CBG (last 3)   Recent Labs  03/21/14 0757 03/21/14 1154 03/21/14 1223  GLUCAP 108* 68* 96    Scheduled Meds: . heparin  5,000 Units Subcutaneous 3 times per day  . metoprolol  2.5 mg  Intravenous 4 times per day  . morphine  30 mg Oral Q12H  .  morphine injection  4 mg Intravenous Q2H    Continuous Infusions: . sodium chloride 10 mL/hr at 03/20/14 [id number]    Past Medical History  Diagnosis Date  . CHF (congestive heart failure)   . Coronary artery disease   . Hypertension   . Arthritis   . Edema of extremities   . Anxiety   . HLD (hyperlipidemia)   . Peripheral vascular disease   . Stroke Jan 2015    left sided weakness, trouble speaking  . Cataracts, bilateral   . Detached retina     right eye    Past Surgical History  Procedure Laterality Date  . Total knee arthroplasty Bilateral     knee replacements  . Lumbar disc surgery    . Cholecystectomy    .  Posterior cervical fusion/foraminotomy N/A 12/28/2012    Procedure: POSTERIOR CERVICAL FUSION/FORAMINOTOMY LEVEL 3;  Surgeon: Barnett AbuHenry Elsner, MD;  Location: [facility];  Service: Neurosurgery;  Laterality: N/A;  Posterior Cervical Three-Six Laminectomy and Fusion  . Angioplasty    . Tonsillectomy    . Joint replacement    . Tubal ligation    . Retinal detachment surgery      2x - right eye    Ian Malkineanne Barnett RD, LDN Inpatient Clinical Dietitian Pager: [phone] After Hours Pager: [phone]

## 2014-03-21 NOTE — Progress Notes (Signed)
Pt sacrum dsg changed; pt transported off to MRI. Toni MerlesP. Amo Omario Ander RN.

## 2014-03-21 NOTE — Progress Notes (Signed)
Subjective: Patient with longstanding stage IV sacral decubitus ulcer and question of colo-uterine fistula continues to complain of low back pain today, worse with movement. She denies any vaginal bleeding, purulent or feculent drainage or air passing. She states she is passing gas normally but has not had a BM. Overall she feels lethargic and has not been eating or drinking, she is complaining of nausea.   Objective: Vital signs in last 24 hours: Temp:  [97.6 F (36.4 C)-98.6 F (37 C)] 98.6 F (37 C) (07/07 4098) Pulse Rate:  [85-120] 92 (07/07 0637) Resp:  [11-15] 15 (07/07 0637) BP: (80-139)/(45-69) 115/45 mmHg (07/07 0637) SpO2:  [95 %-100 %] 100 % (07/07 0637)    Intake/Output from previous day: 07/06 0701 - 07/07 0700 In: 242.5 [P.O.:120; I.V.:122.5] Out: -  Intake/Output this shift:    PE: Physical Exam General:  WD/WN white female in bed in NAD. Appears lethargic. HEENT:  Head is normocephalic and atraumatic. PERRL, EOMI, without scleral icterus. Ears and Nose without lesions or masses. Neck is supple, no JVD, no masses. MMM.  Cardiovascular: RRR, S1 S2 auscultated, no rubs, murmurs or gallops. Palpable pedal and radial pulses bilaterally. Respiratory: Clear to auscultation bilaterally without wheezes, ronchi or rales. Equal chest rise bilaterally with normal respiratory effort. Abdomen: Soft, n/t, n/d, + BS. No hepatosplenomegaly. Without varices, hernias, or masses. Well healed inscisional scars noted.  Buttock: Sacral decubitus ulcer 2x3x1 cm with undermining noted at the 5:30 o'clock position with minimal greenish drainage. Wound is clean and pink without evidence of cellulitis. There is some MARSI noted on the right buttock from previous tape stripping. Extremities: Warm and dry without icterus, cyanosis, clubbing, or edema. Skin: Without rashes exudates or nodules.  Neuro: AAOx3, cranial nerves grossly intact.  Psych: Pleasant and cooperative with appropriate  affect.   Lab Results:   Recent Labs  03/20/14 0611 03/21/14 0701  WBC 10.8* PENDING  HGB 10.7* 10.1*  HCT 32.9* 30.7*  PLT 218 PENDING   BMET  Recent Labs  03/19/14 1935 03/20/14 0611  NA 138 137  K 4.6 4.2  CL 99 98  CO2 24 26  GLUCOSE 103* 113*  BUN 26* 24*  CREATININE 1.05 0.83  CALCIUM 10.0 9.4   PT/INR No results found for this basename: LABPROT, INR,  in the last 72 hours CMP     Component Value Date/Time   NA 137 03/20/2014 0611   K 4.2 03/20/2014 0611   CL 98 03/20/2014 0611   CO2 26 03/20/2014 0611   GLUCOSE 113* 03/20/2014 0611   BUN 24* 03/20/2014 0611   CREATININE 0.83 03/20/2014 0611   CALCIUM 9.4 03/20/2014 0611   PROT 7.0 03/20/2014 0611   ALBUMIN 3.1* 03/20/2014 0611   AST 29 03/20/2014 0611   ALT 19 03/20/2014 0611   ALKPHOS 119* 03/20/2014 0611   BILITOT 0.4 03/20/2014 0611   GFRNONAA 65* 03/20/2014 0611   GFRAA 75* 03/20/2014 0611   Lipase     Component Value Date/Time   LIPASE 17 03/19/2014 1935       Studies/Results: Ct Abdomen Pelvis W Contrast  03/19/2014   CLINICAL DATA:  Abdominal pain, low back pain, nausea.  EXAM: CT ABDOMEN AND PELVIS WITH CONTRAST  TECHNIQUE: Multidetector CT imaging of the abdomen and pelvis was performed using the standard protocol following bolus administration of intravenous contrast.  CONTRAST:  OMNIPAQUE IOHEXOL 300 MG/ML  SOLN  COMPARISON:  None.  FINDINGS: Dependent atelectasis in the lung bases. Densely calcified coronary arteries.  No effusions. Heart is upper limits normal in size. Oral contrast material is seen within the esophagus which may be related to dysmotility or reflux.  Prior cholecystectomy. Common bile duct is dilated, likely related to post cholecystectomy state. No focal abnormality within the liver, spleen, pancreas, adrenals. Areas of atrophy and scarring in the posterior upper and mid poles of the right kidney. Given the heavily calcified aorta and renal arteries, this is likely from chronic renovascular  disease. Small cyst in the midpole of the left kidney. No hydronephrosis.  Scattered colonic diverticulosis. Large stool burden throughout the colon. Small bowel is decompressed. Stomach is unremarkable.  Gas noted within the endometrial canal of the uterus. The sigmoid colon is contiguous with the fundus of the uterus and there are several diverticula in the region, but no inflammatory process or visible fistula, but fistula is a concern if the patient has not had recent instrumentation.  Gas noted within the soft tissues extending from the skin surface in the midline of the buttocks deep to near the coccyx compatible with large decubitus ulcer.  There is also a gas collection noted to the left of the aortic bifurcation between the aortic bifurcation and the left psoas muscle in the left retroperitoneum (image 47). This is adjacent to a severely degenerated L4-5 disc with vacuum disc noted, but no definite connection to the disc space. This conceivably could represent a large herniated disc fragment, but other cause of gas cannot be excluded. Again, no significant inflammation surrounding this area.  IMPRESSION: Gas within the scratch head gas fills the endometrial canal of the uterus. The sigmoid colon is a medially contiguous to the uterus with diverticulosis, but no inflammatory process noted at this time. 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 Given the lack of inflammation, this may be a chronic longstanding process.  Large sacral decubitus ulcer. Gas extends down to the coccyx. No bony changes to suggest osteomyelitis.  Small gas collection in the left retroperitoneum near the aortic bifurcation of unknown etiology. Question large herniated disc fragment from the adjacent degenerated disc. Again, no inflammation in this area to suggest active inflammatory process or to suggest this is an abscess.  These results were called by telephone at the  time of interpretation on 03/19/2014 at 9:55 PM to Dr. Pricilla LovelessSCOTT GOLDSTON , who verbally acknowledged these results.   Electronically Signed   By: Charlett NoseKevin  Dover M.D.   On: 03/19/2014 21:55    Anti-infectives: Anti-infectives   Start     Dose/Rate Route Frequency Ordered Stop   03/20/14 2200  ertapenem (INVANZ) 1 g in sodium chloride 0.9 % 50 mL IVPB     1 g 100 mL/hr over 30 Minutes Intravenous Every 24 hours 03/20/14 0255     03/20/14 2200  vancomycin (VANCOCIN) IVPB 1000 mg/200 mL premix     1,000 mg 200 mL/hr over 60 Minutes Intravenous Every 24 hours 03/20/14 0321     03/19/14 2300  ertapenem (INVANZ) 1 g in sodium chloride 0.9 % 50 mL IVPB     1 g 100 mL/hr over 30 Minutes Intravenous  Once 03/19/14 2248 03/20/14 0106   03/19/14 2245  vancomycin (VANCOCIN) IVPB 1000 mg/200 mL premix     1,000 mg 200 mL/hr over 60 Minutes Intravenous  Once 03/19/14 2232 03/20/14 0014       Assessment/Plan Sacral decubitus ulcer  Gas in endometrial canal ?colo-uterine fistula Low back pain Chronic Constipation CHF/HTN Hx  of CVA  Plan: 1.Sacral decubitus ulcer does not require surgery. Continue wound care and follow up with wound center as outpatient. 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 Patient denies any vaginal bleeding, purulent or feculent drainage or air passing. She is afebrile and denies abdominal pain. Unlikely that this is the cause of her low back pain. Patient receiving Vancomycin per medicine. 3. MRI is pending to determine etiology of back pain.  4. Continue IVF, pain control and anti-emetics. 5. SCD's and lovenox for DVT prophylaxis. 6. Mobilize and IS while awake.  From a surgical standpoint the patient may be discharged with wound center follow up. She may benefit from further workup by GI and OBGYN.   LOS: 2 days    Marilu FavreWerner, Katherine Kate Werner PA-S [facility] 03/21/2014, 8:16 AM Office:  [phone]  ----------------------------------------------------------------------------------------------------------- General Surgery PA Preceptor Note:  I agree with the above PA students findings as above.  Noted elevation in WBC count and apparent AKI.  Don't think the cause is her decub or abdomen.  Appreciate Medicines care of this patient.  Please call with questions/concerns.    Aris GeorgiaMegan Dort, PA-C [facility] Pager: [phone] Office: [phone]

## 2014-03-21 NOTE — Clinical Social Work Note (Signed)
CSW consulted and currently following for determination for discharge disposition.  Marcelline DeistEmily Harmonee Tozer, MSW, [facility] Licensed Clinical Social Worker [phone] and [phone] [phone]

## 2014-03-21 NOTE — Progress Notes (Signed)
Patient Demographics  [name], is a 80 y.o. female, DOB - [birth year], ZOX:[id number]  Admit date - 03/19/2014   Admitting Physician Eduard Clos, MD  Outpatient Primary MD for the patient is [name], MD  LOS - 2   Chief Complaint  Patient presents with  . Back Pain       Subjective:   [name] today has, No headache, No chest pain, No abdominal pain - No Nausea, No new weakness tingling or numbness, No Cough - SOB. ++ low back pain, he she wants to die and not live like this. She wants to become hospice.    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 Surgery following. We wanted to get an MRI of her back to look for discitis etc., however patient at this time says she does not want any further testing or invasive procedures, she wants care to be directed towards comfort. She says I am dying this is my time and I want to pass away and comfort. She wants to talk to hospice. Pain medications adjusted. Will hold antibiotics for now.  Active care consulted, have requested social work to arrange for residential hospice, discussed with her daughter [name] as well.     2. History of hypertension and chronic combined systolic and diastolic CHF EF 81%. Currently compensated from CHF standpoint no acute, continue beta blocker fluid and salt restriction.      3. Anemia of chronic disease. Supportive care and monitor.     Code Status: DNR D/W daughter  Family Communication: Daughter x 2  Disposition Plan: Residential hospice   Procedures CT scan  abdomen pelvis   Consults [facility], palliative care   Medications  Scheduled Meds: . ertapenem  1 g Intravenous Q24H  . heparin  5,000 Units Subcutaneous 3 times per day  . metoprolol  2.5 mg Intravenous 4 times per day  . morphine  30 mg Oral Q12H  .  morphine injection  4 mg Intravenous Q2H  . vancomycin  1,000 mg Intravenous Q24H   Continuous Infusions: . sodium chloride 10 mL/hr at 03/20/14 0307   PRN Meds:.diclofenac sodium, hydrALAZINE, naLOXone (NARCAN)  injection, ondansetron (ZOFRAN) IV, sodium chloride  DVT Prophylaxis    Heparin   Lab Results  Component Value Date   PLT PLATELET CLUMPS NOTED ON SMEAR, COUNT APPEARS ADEQUATE 03/21/2014    Antibiotics     Anti-infectives   Start     Dose/Rate Route Frequency Ordered Stop   03/20/14 2200  ertapenem (INVANZ) 1 g in sodium chloride 0.9 % 50 mL IVPB     1 g 100 mL/hr over 30 Minutes Intravenous Every 24 hours 03/20/14 0255     03/20/14 2200  vancomycin (VANCOCIN) IVPB 1000 mg/200 mL premix     1,000 mg 200 mL/hr over 60 Minutes Intravenous Every 24 hours 03/20/14 0321     03/19/14 2300  ertapenem (INVANZ) 1 g in sodium chloride 0.9 % 50 mL IVPB     1 g 100 mL/hr  over 30 Minutes Intravenous  Once 03/19/14 2248 03/20/14 0106   03/19/14 2245  vancomycin (VANCOCIN) IVPB 1000 mg/200 mL premix     1,000 mg 200 mL/hr over 60 Minutes Intravenous  Once 03/19/14 2232 03/20/14 0014          Objective:   Filed Vitals:   03/20/14 1619 03/20/14 1650 03/20/14 2036 03/21/14 0637  BP: 80/47 112/63 133/54 115/45  Pulse: 103 120 97 92  Temp: 97.6 F (36.4 C) 98.3 F (36.8 C) 98.3 F (36.8 C) 98.6 F (37 C)  TempSrc: Oral Axillary Oral Oral  Resp: 11 12 15 15   Height:      Weight:      SpO2:  97% 99% 100%    Wt Readings from Last 3 Encounters:  03/20/14 73.664 kg (162 lb 6.4 oz)  03/13/14 75.751 kg (167 lb)  11/18/13 73.936 kg (163 lb)     Intake/Output Summary (Last 24 hours) at 03/21/14 1040 Last data filed at  03/20/14 1800  Gross per 24 hour  Intake  242.5 ml  Output      0 ml  Net  242.5 ml     Physical Exam  Awake Alert, Oriented X 3, No new F.N deficits, Normal affect [place].AT,PERRAL Supple Neck,No JVD, No cervical lymphadenopathy appriciated.  Symmetrical Chest wall movement, Good air movement bilaterally, CTAB RRR,No Gallops,Rubs or new Murmurs, No Parasternal Heave +ve B.Sounds, Abd Soft, No tenderness, No organomegaly appriciated, No rebound - guarding or rigidity. No Cyanosis, Clubbing or edema, No new Rash or bruise      Data Review   Micro Results No results found for this or any previous visit (from the past 240 hour(s)).  Radiology Reports   Ct Abdomen Pelvis W Contrast  03/19/2014   CLINICAL DATA:  Abdominal pain, low back pain, nausea.  EXAM: CT ABDOMEN AND PELVIS WITH CONTRAST  TECHNIQUE: Multidetector CT imaging of the abdomen and pelvis was performed using the standard protocol following bolus administration of intravenous contrast.  CONTRAST:  100mL OMNIPAQUE IOHEXOL 300 MG/ML  SOLN  COMPARISON:  None.  FINDINGS: Dependent atelectasis in the lung bases. Densely calcified coronary arteries. No effusions. Heart is upper limits normal in size. Oral contrast material is seen within the esophagus which may be related to dysmotility or reflux.  Prior cholecystectomy. Common bile duct is dilated, likely related to post cholecystectomy state. No focal abnormality within the liver, spleen, pancreas, adrenals. Areas of atrophy and scarring in the posterior upper and mid poles of the right kidney. Given the heavily calcified aorta and renal arteries, this is likely from chronic renovascular disease. Small cyst in the midpole of the left kidney. No hydronephrosis.  Scattered colonic diverticulosis. Large stool burden throughout the colon. Small bowel is decompressed. Stomach is unremarkable.  Gas noted within the endometrial canal of the uterus. The sigmoid colon is contiguous with the  fundus of the uterus and there are several diverticula in the region, but no inflammatory process or visible fistula, but fistula is a concern if the patient has not had recent instrumentation.  Gas noted within the soft tissues extending from the skin surface in the midline of the buttocks deep to near the coccyx compatible with large decubitus ulcer.  There is also a gas collection noted to the left of the aortic bifurcation between the aortic bifurcation and the left psoas muscle in the left retroperitoneum (image 47). This is adjacent to a severely degenerated L4-5 disc with vacuum disc noted, but no definite  connection to the disc space. This conceivably could represent a large herniated disc fragment, but other cause of gas cannot be excluded. Again, no significant inflammation surrounding this area.  IMPRESSION: Gas within the scratch head gas fills the endometrial canal of the uterus. The sigmoid colon is a medially contiguous to the uterus with diverticulosis, but no inflammatory process noted at this time. 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 Given the lack of inflammation, this may be a chronic longstanding process.  Large sacral decubitus ulcer. Gas extends down to the coccyx. No bony changes to suggest osteomyelitis.  Small gas collection in the left retroperitoneum near the aortic bifurcation of unknown etiology. Question large herniated disc fragment from the adjacent degenerated disc. Again, no inflammation in this area to suggest active inflammatory process or to suggest this is an abscess.  These results were called by telephone at the time of interpretation on 03/19/2014 at 9:55 PM to Dr. Pricilla Loveless , who verbally acknowledged these results.   Electronically Signed   By: Charlett Nose M.D.   On: 03/19/2014 21:55    CBC  Recent Labs Lab 03/19/14 1935 03/20/14 0611 03/21/14 0701  WBC 12.1* 10.8* 16.1*  HGB 10.7* 10.7*  10.1*  HCT 31.9* 32.9* 30.7*  PLT 233 218 PLATELET CLUMPS NOTED ON SMEAR, COUNT APPEARS ADEQUATE  MCV 93.3 95.1 95.9  MCH 31.3 30.9 31.6  MCHC 33.5 32.5 32.9  RDW 13.6 13.8 13.8  LYMPHSABS 1.5 0.9  --   MONOABS 0.7 0.7  --   EOSABS 0.1 0.0  --   BASOSABS 0.0 0.0  --     Chemistries   Recent Labs Lab 03/19/14 1935 03/20/14 0611 03/21/14 0701  NA 138 137 138  K 4.6 4.2 4.6  CL 99 98 98  CO2 24 26 25   GLUCOSE 103* 113* 102*  BUN 26* 24* 41*  CREATININE 1.05 0.83 1.49*  CALCIUM 10.0 9.4 9.1  AST 34 29  --   ALT 21 19  --   ALKPHOS 127* 119*  --   BILITOT 0.4 0.4  --    ------------------------------------------------------------------------------------------------------------------ estimated creatinine clearance is 28.2 ml/min (by C-G formula based on Cr of 1.49). ------------------------------------------------------------------------------------------------------------------ No results found for this basename: HGBA1C,  in the last 72 hours ------------------------------------------------------------------------------------------------------------------ No results found for this basename: CHOL, HDL, LDLCALC, TRIG, CHOLHDL, LDLDIRECT,  in the last 72 hours ------------------------------------------------------------------------------------------------------------------ No results found for this basename: TSH, T4TOTAL, FREET3, T3FREE, THYROIDAB,  in the last 72 hours ------------------------------------------------------------------------------------------------------------------ No results found for this basename: VITAMINB12, FOLATE, FERRITIN, TIBC, IRON, RETICCTPCT,  in the last 72 hours  Coagulation profile No results found for this basename: INR, PROTIME,  in the last 168 hours  No results found for this basename: DDIMER,  in the last 72 hours  Cardiac Enzymes No results found for this basename: CK, CKMB, TROPONINI, MYOGLOBIN,  in the last 168  hours ------------------------------------------------------------------------------------------------------------------ No components found with this basename: POCBNP,      Time Spent in minutes 35   Dwyne Hasegawa K M.D on 03/21/2014 at 10:40 AM  Between 7am to 7pm - Pager - [phone]  After 7pm go to [email or web address] - password [email or web address]  And look for the night coverage person covering for me after hours  [facility] Office  [phone]   **Disclaimer: This note may have been dictated with voice recognition software. Similar sounding words can inadvertently be transcribed and this note may contain transcription errors which  may not have been corrected upon publication of note.**

## 2014-03-22 ENCOUNTER — Ambulatory Visit (HOSPITAL_COMMUNITY): Payer: Medicare Other

## 2014-03-22 ENCOUNTER — Inpatient Hospital Stay (HOSPITAL_COMMUNITY): Payer: Medicare Other

## 2014-03-22 DIAGNOSIS — L89109 Pressure ulcer of unspecified part of back, unspecified stage: Principal | ICD-10-CM

## 2014-03-22 DIAGNOSIS — M545 Low back pain, unspecified: Secondary | ICD-10-CM

## 2014-03-22 DIAGNOSIS — L8990 Pressure ulcer of unspecified site, unspecified stage: Secondary | ICD-10-CM

## 2014-03-22 DIAGNOSIS — N179 Acute kidney failure, unspecified: Secondary | ICD-10-CM

## 2014-03-22 LAB — URINALYSIS, ROUTINE W REFLEX MICROSCOPIC
BILIRUBIN URINE: NEGATIVE
GLUCOSE, UA: NEGATIVE mg/dL
Hgb urine dipstick: NEGATIVE
Ketones, ur: NEGATIVE mg/dL
Leukocytes, UA: NEGATIVE
NITRITE: NEGATIVE
PH: 5.5 (ref 5.0–8.0)
Protein, ur: NEGATIVE mg/dL
SPECIFIC GRAVITY, URINE: 1.038 — AB (ref 1.005–1.030)
Urobilinogen, UA: 0.2 mg/dL (ref 0.0–1.0)

## 2014-03-22 LAB — GLUCOSE, CAPILLARY
GLUCOSE-CAPILLARY: 114 mg/dL — AB (ref 70–99)
GLUCOSE-CAPILLARY: 137 mg/dL — AB (ref 70–99)
Glucose-Capillary: 82 mg/dL (ref 70–99)
Glucose-Capillary: 100 mg/dL — ABNORMAL HIGH (ref 70–99)
Glucose-Capillary: 101 mg/dL — ABNORMAL HIGH (ref 70–99)
Glucose-Capillary: 55 mg/dL — ABNORMAL LOW (ref 70–99)

## 2014-03-22 LAB — BASIC METABOLIC PANEL
Anion gap: 13 (ref 5–15)
BUN: 56 mg/dL — AB (ref 6–23)
CO2: 24 meq/L (ref 19–32)
Calcium: 8.6 mg/dL (ref 8.4–10.5)
Chloride: 99 mEq/L (ref 96–112)
Creatinine, Ser: 1.79 mg/dL — ABNORMAL HIGH (ref 0.50–1.10)
GFR calc Af Amer: 30 mL/min — ABNORMAL LOW (ref >90)
GFR calc non Af Amer: 26 mL/min — ABNORMAL LOW (ref >90)
GLUCOSE: 117 mg/dL — AB (ref 70–99)
POTASSIUM: 4.4 meq/L (ref 3.7–5.3)
SODIUM: 136 meq/L — AB (ref 137–147)

## 2014-03-22 LAB — HEMOGLOBIN AND HEMATOCRIT, BLOOD
HCT: 19.4 % — ABNORMAL LOW (ref 36.0–46.0)
HCT: 22.6 % — ABNORMAL LOW (ref 36.0–46.0)
HEMOGLOBIN: 6.5 g/dL — AB (ref 12.0–15.0)
Hemoglobin: 7.6 g/dL — ABNORMAL LOW (ref 12.0–15.0)

## 2014-03-22 LAB — PREPARE RBC (CROSSMATCH)

## 2014-03-22 LAB — CREATININE, URINE, RANDOM: CREATININE, URINE: 97.47 mg/dL

## 2014-03-22 LAB — CBC
HEMATOCRIT: 21.7 % — AB (ref 36.0–46.0)
Hemoglobin: 7.1 g/dL — ABNORMAL LOW (ref 12.0–15.0)
MCH: 31.3 pg (ref 26.0–34.0)
MCHC: 32.7 g/dL (ref 30.0–36.0)
MCV: 95.6 fL (ref 78.0–100.0)
Platelets: 189 10*3/uL (ref 150–400)
RBC: 2.27 MIL/uL — AB (ref 3.87–5.11)
RDW: 13.9 % (ref 11.5–15.5)
WBC: 13.9 10*3/uL — ABNORMAL HIGH (ref 4.0–10.5)

## 2014-03-22 LAB — RETICULOCYTES
RBC.: 2.06 MIL/uL — AB (ref 3.87–5.11)
RETIC COUNT ABSOLUTE: 49.4 10*3/uL (ref 19.0–186.0)
RETIC CT PCT: 2.4 % (ref 0.4–3.1)

## 2014-03-22 LAB — OSMOLALITY, URINE: OSMOLALITY UR: 454 mosm/kg (ref 390–1090)

## 2014-03-22 LAB — PROTIME-INR
INR: 1.13 (ref 0.00–1.49)
Prothrombin Time: 14.5 seconds (ref 11.6–15.2)

## 2014-03-22 LAB — SODIUM, URINE, RANDOM: Sodium, Ur: 28 mEq/L

## 2014-03-22 LAB — ABO/RH: ABO/RH(D): AB POS

## 2014-03-22 LAB — APTT: aPTT: 31 seconds (ref 24–37)

## 2014-03-22 MED ORDER — PANTOPRAZOLE SODIUM 40 MG IV SOLR
40.0000 mg | Freq: Two times a day (BID) | INTRAVENOUS | Status: DC
  Administered 2014-03-22 – 2014-03-27 (×11): 40 mg via INTRAVENOUS
  Filled 2014-03-22 (×16): qty 40

## 2014-03-22 MED ORDER — DEXTROSE 50 % IV SOLN
INTRAVENOUS | Status: AC
  Filled 2014-03-22: qty 50

## 2014-03-22 MED ORDER — DIPHENHYDRAMINE HCL 50 MG/ML IJ SOLN: 25.0000 mg | Freq: Four times a day (QID) | INTRAMUSCULAR | Status: DC | PRN

## 2014-03-22 MED ORDER — SODIUM CHLORIDE 0.9 % IJ SOLN
10.0000 mL | INTRAMUSCULAR | Status: DC | PRN
  Administered 2014-03-23: 30 mL
  Administered 2014-03-23 – 2014-03-25 (×3): 20 mL

## 2014-03-22 MED ORDER — SODIUM CHLORIDE 0.9 % IV SOLN
INTRAVENOUS | Status: DC
  Administered 2014-03-22: 19:00:00 via INTRAVENOUS

## 2014-03-22 MED ORDER — SODIUM CHLORIDE 0.9 % IJ SOLN
10.0000 mL | Freq: Two times a day (BID) | INTRAMUSCULAR | Status: DC
  Administered 2014-03-23: 10 mL
  Administered 2014-03-23: 30 mL
  Administered 2014-03-24 – 2014-03-26 (×4): 10 mL

## 2014-03-22 MED ORDER — DEXTROSE-NACL 5-0.45 % IV SOLN
INTRAVENOUS | Status: DC
  Administered 2014-03-22 – 2014-03-26 (×5): via INTRAVENOUS

## 2014-03-22 MED ORDER — DEXTROSE 50 % IV SOLN
25.0000 mL | Freq: Once | INTRAVENOUS | Status: AC | PRN
  Administered 2014-03-22: 25 mL via INTRAVENOUS

## 2014-03-22 MED ORDER — FUROSEMIDE 10 MG/ML IJ SOLN
INTRAMUSCULAR | Status: AC
  Filled 2014-03-22: qty 4

## 2014-03-22 MED ORDER — FUROSEMIDE 10 MG/ML IJ SOLN
20.0000 mg | INTRAMUSCULAR | Status: AC
  Administered 2014-03-22: 20 mg via INTRAVENOUS

## 2014-03-22 NOTE — Procedures (Signed)
Central Venous Catheter Insertion Procedure Note [name] [id number] [birth year]  Procedure: Insertion of Central Venous Catheter Indications: Assessment of intravascular volume  Procedure Details Consent: Risks of procedure as well as the alternatives and risks of each were explained to the (patient/caregiver).  Consent for procedure obtained. Time Out: Verified patient identification, verified procedure, site/side was marked, verified correct patient position, special equipment/implants available, medications/allergies/relevent history reviewed, required imaging and test results available.  Performed  Maximum sterile technique was used including antiseptics, cap, gloves, gown, hand hygiene, mask and sheet. Skin prep: Chlorhexidine; local anesthetic administered A antimicrobial bonded/coated triple lumen catheter was placed in the right internal jugular vein using the Seldinger technique. Ultrasound guidance used.Yes.   Catheter placed to 16 cm. Blood aspirated via all 3 ports and then flushed x 3. Line sutured x 2 and dressing applied.  Evaluation Blood flow good Complications: No apparent complications Patient did tolerate procedure well. Chest X-ray ordered to verify placement.  CXR: pending.  Joneen RoachPaul Hoffman, ACNP [facility] Pager [phone] or [phone]  Attending:  I supervised the procedure  Heber CarolinaBrent Jajaira Ruis, MD [facility] Pager: [phone] Cell: [phone] If no response, call [phone]

## 2014-03-22 NOTE — Progress Notes (Signed)
PT Cancellation Note  Patient Details Name: Toni PartridgeMayfie A Diaz MRN: [id number] DOB: 1930/11/28   Cancelled Treatment:    Reason Eval/Treat Not Completed: Patient not medically ready.  Pt placed on bedrest and remains so after order for PT evaluation.  Will ask for activity orders and evaluated as soon as appropriate. 03/22/2014  [place] BingKen Fleming Diaz, PT [phone]  (pager)   [name], Toni GumKenneth Diaz 03/22/2014, 4:55 PM

## 2014-03-22 NOTE — Consult Note (Signed)
[facility] Consult  Reason for Consult: fluid collection around L4, concerns for retroperitoneal bleed or aortic-vertebral communication.  Consulting physician: Dr. Thedore MinsSingh MRN #:  [id number]  History of Present Illness: This is a 80 y.o. female with past medical history of stage IV sacral decubitus ulcer, chronic back pain, CHF, CVA and CAD who presented with increased lower back pain over the past few days. Her pain was localized to her sacral area where she has a decubitus ulcer. She also noted progressive weakness of her extremities and is being followed by Dr. Danielle DessElsner, neurosurgeon. 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 Per general surgery, she does not need any surgical intervention nor intervention for her decubitus ulcer. The patient denies any history of stool from her vagina. She was admitted for further management.   An MRI of her spine was ordered to rule out any disc compression as etiology of her back pain. She initially refused to have any further test or procedures, opting for comfort care only. However, her daughter later insisted that further treatment be pursued. The MRI showed a fluid collection around L4. We were consulted to review her imaging and discuss any possible evidence of retroperitoneal bleeding or aortic aneurysm/vertebral communication and any necessary surgical interventions.   She has a history of CVA, CAD, and CHF. She has hypertension and is on a statin. She takes a daily aspirin. She has hypertension and treated with beta blocker and ARB. She is not diabetic.   The patient is an unreliable historian. On ROS, she denies any chest pain or shortness of breath. She denies any history of non-healing wounds. She notes pain in her feet at night and pain in her legs with walking. She walks with a walker and lives at home. She does not use home O2.  Past Medical History  Diagnosis Date  .  CHF (congestive heart failure)   . Coronary artery disease   . Hypertension   . Arthritis   . Edema of extremities   . Anxiety   . HLD (hyperlipidemia)   . Peripheral vascular disease   . Stroke Jan 2015    left sided weakness, trouble speaking  . Cataracts, bilateral   . Detached retina     right eye  . Decubitus ulcer of sacral area     STAGE 4   Past Surgical History  Procedure Laterality Date  . Total knee arthroplasty Bilateral     knee replacements  . Lumbar disc surgery    . Cholecystectomy    . Posterior cervical fusion/foraminotomy N/A 12/28/2012    Procedure: POSTERIOR CERVICAL FUSION/FORAMINOTOMY LEVEL 3;  Surgeon: Barnett AbuHenry Elsner, MD;  Location: [facility];  Service: Neurosurgery;  Laterality: N/A;  Posterior Cervical Three-Six Laminectomy and Fusion  . Angioplasty    . Tonsillectomy    . Joint replacement    . Tubal ligation    . Retinal detachment surgery      2x - right eye    Allergies  Allergen Reactions  . Penicillins Hives, Shortness Of Breath and Nausea Only    Throat closed up and had hives and nausea. Tolerates carbapenems.    Prior to Admission medications   Medication Sig Start Date End Date Taking? Authorizing Provider  ALPRAZolam Prudy Feeler(XANAX) 0.25 MG tablet Take 0.25 mg by mouth at bedtime as needed for anxiety.   Yes Historical Provider, MD  aspirin 325 MG tablet Take 325 mg by  mouth daily.   Yes Historical Provider, MD  Calcium Carb-Cholecalciferol (CALCIUM 600 + D PO) Take 1 tablet by mouth daily.   Yes Historical Provider, MD  diclofenac sodium (VOLTAREN) 1 % GEL Apply 2 g topically 2 (two) times daily as needed (pain).   Yes Historical Provider, MD  furosemide (LASIX) 20 MG tablet Take 20 mg by mouth daily.   Yes Historical Provider, MD  losartan (COZAAR) 50 MG tablet Take 50 mg by mouth daily. 12/02/13  Yes Historical Provider, MD  Menthol, Topical Analgesic, (BIOFREEZE EX) Apply 1 application topically daily as needed (pain).   Yes Historical  Provider, MD  methocarbamol (ROBAXIN) 500 MG tablet Take 500 mg by mouth every 6 (six) hours as needed for muscle spasms.    Yes Historical Provider, MD  metoprolol (LOPRESSOR) 50 MG tablet Take 50 mg by mouth 2 (two) times daily.   Yes Historical Provider, MD  Multiple Vitamin (MULTIVITAMIN WITH MINERALS) TABS Take 1 tablet by mouth daily.   Yes Historical Provider, MD  naproxen sodium (ANAPROX) 220 MG tablet Take 220 mg by mouth 2 (two) times daily as needed (pain).    Yes Historical Provider, MD  oxyCODONE-acetaminophen (PERCOCET) 10-325 MG per tablet Take 1 tablet by mouth every 6 (six) hours.   Yes Historical Provider, MD  pramipexole (MIRAPEX) 0.5 MG tablet Take 0.5 mg by mouth at bedtime.    Yes Historical Provider, MD  pravastatin (PRAVACHOL) 80 MG tablet Take 80 mg by mouth every evening.  09/09/13  Yes Historical Provider, MD    History   Social History  . Marital Status: Widowed    Spouse Name: N/A    Number of Children: 5  . Years of Education: N/A   Occupational History  . retired    Social History Main Topics  . Smoking status: Never Smoker   . Smokeless tobacco: Never Used  . Alcohol Use: Yes     Comment: rarely  . Drug Use: No  . Sexual Activity: Not on file   Other Topics Concern  . Not on file   Social History Narrative  . No narrative on file   Son with history of MI  Family History  Problem Relation Age of Onset  . Dementia Sister     x 3  . CAD Brother   . Breast cancer Sister     x 3  . Heart disease Sister   . Heart disease Son     x 2  . Cancer Son   . Hyperlipidemia Son   . Hypertension Son   . Alzheimer's disease Sister   . Depression Daughter   . Hypertension Daughter   . Hyperlipidemia Daughter     ROS: [x]  Positive   [ ]  Negative   [ ]  All sytems reviewed and are negative  Cardiovascular: []  chest pain/pressure []  palpitations [x]  SOB lying flat []  DOE [x]  pain in legs while walking []  pain in legs at rest [x]  pain in legs  at night []  non-healing ulcers []  hx of DVT []  swelling in legs  Pulmonary: []  productive cough []  asthma/wheezing []  home O2  Neurologic: [x]  weakness in [x]  arms [x]  legs []  numbness in []  arms []  legs []  hx of CVA []  mini stroke [] difficulty speaking or slurred speech []  temporary loss of vision in one eye []  dizziness  Hematologic: []  hx of cancer []  bleeding problems []  problems with blood clotting easily  Endocrine:   []  diabetes []  thyroid disease  GI []   vomiting blood []  blood in stool  GU: []  CKD/renal failure []  HD--[]  M/W/F or []  T/T/S []  burning with urination []  blood in urine  Psychiatric: []  anxiety []  depression  Musculoskeletal: []  arthritis []  joint pain  Integumentary: []  rashes []  ulcers  Constitutional: []  fever [x]  chills   Physical Examination  Filed Vitals:   03/22/14 0500  BP: 138/52  Pulse: 85  Temp: 97.2 F (36.2 C)  Resp: 18   Body mass index is 29.89 kg/(m^2).  General:  Lethargic elderly female resting in bed in NAD. Alert & Oriented only to person.  Gait: Not observed HENT: WNL, normocephalic Eyes: did not open right eye. Left eye extraocular movements intact laterally. Patient with bilateral cataracts.  Pulmonary: normal non-labored breathing, without Rales, rhonchi,  wheezing Cardiac: regular, without  Murmurs, rubs or gallops; without carotid bruits Abdomen: soft, NT/ND, no masses, normoactive bowel sounds  Skin: without rashes, sacral decubitus ulcer  Vascular Exam/Pulses:  Right Left  Radial 2+ (normal) 2+ (normal)  Femoral absent absent  Popliteal absent 1+ (weak)  DP absent absent  PT absent absent   Extremities: without ischemic changes, without Gangrene , without cellulitis; without open wounds; feet are warm.  Musculoskeletal: no muscle wasting or atrophy;  Neurologic: A&O to person; Appropriate Affect ; SENSATION: normal; MOTOR FUNCTION:  moving all extremities. Speech is  fluent/normal Psychiatric: Mood & affect appropriate for pt's clinical situation   CBC    Component Value Date/Time   WBC 13.9* 03/22/2014 0010   RBC 2.27* 03/22/2014 0010   HGB 7.1* 03/22/2014 0010   HCT 21.7* 03/22/2014 0010   PLT 189 03/22/2014 0010   MCV 95.6 03/22/2014 0010   MCH 31.3 03/22/2014 0010   MCHC 32.7 03/22/2014 0010   RDW 13.9 03/22/2014 0010   LYMPHSABS 0.9 03/20/2014 0611   MONOABS 0.7 03/20/2014 0611   EOSABS 0.0 03/20/2014 0611   BASOSABS 0.0 03/20/2014 0611    BMET    Component Value Date/Time   NA 136* 03/22/2014 0010   K 4.4 03/22/2014 0010   CL 99 03/22/2014 0010   CO2 24 03/22/2014 0010   GLUCOSE 117* 03/22/2014 0010   BUN 56* 03/22/2014 0010   CREATININE 1.79* 03/22/2014 0010   CALCIUM 8.6 03/22/2014 0010   GFRNONAA 26* 03/22/2014 0010   GFRAA 30* 03/22/2014 0010    COAGS: Lab Results  Component Value Date   INR 1.02 09/30/2013   INR 0.9 03/07/2008   INR 1.9* 02/22/2007    Statin:  Yes.   Beta Blocker:  Yes.   Aspirin:  Yes.   ACEI:  No. ARB:  Yes.   Other antiplatelets/anticoagulants:  No.   ASSESSMENT: This is a 80 y.o. female with -low back pain with unknown etiology with fluid collection around L4 -aortoiliac stenosis -chronic stage IV sacro-coccygeal decubitus ulcer -colo-uterine fistula -anemia of chronic disease -ARF -CHF  PLAN:  CT abdomen and MR L spine does not appear to show evidence of retroperitoneal bleeding or abdominal aortic aneurysm or dissection. Her Hgb did drop from 10.1 to 7.1 today but do not believe this is from any retroperitoneal bleeding. She does not need any surgical intervention at this time and she is not an appropriate surgical candidate. CT guided needle aspiration was ordered by medicine for gram stain and culture. She has agreed to the procedure and any warranted IV antibiotics. She has severe aorto-iliac stenosis more pronounced on the right. However, she is asymptomatic and mostly sedentary. She does not need any surgical  intervention at  this time.   Dr. Hart RochesterLawson to talk with daughter.   Maris BergerKimberly Trinh, PA-C [facility] Office: [phone] Pager: [phone]  I have examined the patient in spoken with patient and daughter. Agree with above assessment Patient has no abdominal pain on physical exam today. Somewhat somnolent but arousable Having reviewed the CT angiogram with my partners-we do not feel that there is significant evidence that the aorta is involved. Patient is not an operative candidate and the daughter is in total agreement with this. If aspiration reveals infected space then patient could be treated with IV antibiotics appropriately If aspiration revealed retroperitoneal hematoma-patient has not operative candidate We'll see again at your request

## 2014-03-22 NOTE — Progress Notes (Addendum)
Patient Demographics  [name], is a 80 y.o. female, DOB - 03-Nov-1930, ZOX:[id number]  Admit date - 03/19/2014   Admitting Physician Eduard Clos, MD  Outpatient Primary MD for the patient is [name], MD  LOS - 3   Chief Complaint  Patient presents with  . Back Pain       Subjective:   [name] today has, No headache, No chest pain, No abdominal pain - No Nausea, No new weakness tingling or numbness, No Cough - SOB. ++ low back pain, he she wants to die and not live like this. She wants to become hospice however family wants to pursue treatment.   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 Surgery following. No need for surgery. Wound care following.   We wanted to get an MRI of her back to look for discitis etc., however patient at this time says she does not want any further testing or invasive procedures, she wants care to be directed towards comfort. She says I am dying this is my time and I want to pass away and comfort. At that point but if there was also good. However in the evening family changed opinion and wanted further testing done.   MRI of the back was then ordered yesterday evening which showed changes suggestive of an L-spine fluid therapy at L4 level also there was a suspicion of a to improve fistula. A CT angiogram of the abdomen and pelvis was also done which was inconclusive. I discussed her status with Dr. Hart Rochester vascular surgeon in detail, he has ruled out retroperitoneal bleed or any aortic - Vertebral  communication.   I have discussed her case with Dr. Franky Macho neurosurgeon, who suggests patient is not an operative candidate due to her decubitus ulcer, which I concur with, he will review them and call me shortly. In the meantime I have requested by our to do a CT-guided aspiration of the L-spine cavity Gram stain and culture. She's currently on vancomycin and meropenem which we be continued for now.   Patient's overall prognosis appears to be poor. She herself wants to be comfortable only. However reasonably reversible pathology is followed it might be right to try to medical treatment.     2. History of hypertension and chronic combined systolic and diastolic CHF EF 81%. Currently compensated from CHF standpoint no acute, continue beta blocker fluid and salt restriction.      3. Anemia of chronic disease. Likely for comminution from IV fluids, reviewed her CT MRI in detail with vascular surgery, no signs of the retroperitoneal bleed, will check Hemoccult blood and anemia panel, to be H&H if hemoglobin continues to be Lovenox osseous packed RBCs. Protonix to be started on a twice a day.     4. ARF - likely due to prerenal azotemia, also some injury from CT IV contrast which was needed to rule out aortic dissection. A medial electrolytes, bladder scan, IV fluids, alert and the toxins, monitor BMP. Poor candidate for  dialysis.    5. Stage IV chronic stage IV sacro- coccygeal Decub Ulcer . Wound care following. Seen by surgery. Not a surgical need at this time.     6. 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       Code Status: DNR D/W daughter  Family Communication: Daughter on a daily basis  Disposition Plan: Residential hospice   Procedures CT scan abdomen pelvis   Consults [facility], palliative care, Vas Surgey, N surgery Dr Franky Macho and Dr Danielle Dess over the phone on 03-22-14   Medications  Scheduled Meds: . feeding  supplement (ENSURE)  1 Container Oral BID BM  . furosemide  20 mg Intravenous Q4H  . lactose free nutrition  237 mL Oral Q24H  . meropenem (MERREM) IV  500 mg Intravenous Q12H  . metoprolol  2.5 mg Intravenous 4 times per day  . morphine  30 mg Oral Q12H  .  morphine injection  4 mg Intravenous Q2H  . vancomycin  750 mg Intravenous Q24H   Continuous Infusions: . sodium chloride     PRN Meds:.diclofenac sodium, diphenhydrAMINE, hydrALAZINE, naLOXone (NARCAN)  injection, ondansetron (ZOFRAN) IV, sodium chloride  DVT Prophylaxis    SCD  Lab Results  Component Value Date   PLT 189 03/22/2014    Antibiotics     Anti-infectives   Start     Dose/Rate Route Frequency Ordered Stop   03/22/14 0200  meropenem (MERREM) 500 mg in sodium chloride 0.9 % 50 mL IVPB     500 mg 100 mL/hr over 30 Minutes Intravenous Every 12 hours 03/21/14 1701     03/22/14 0100  vancomycin (VANCOCIN) IVPB 750 mg/150 ml premix     750 mg 150 mL/hr over 60 Minutes Intravenous Every 24 hours 03/21/14 1701     03/20/14 2200  ertapenem (INVANZ) 1 g in sodium chloride 0.9 % 50 mL IVPB  Status:  Discontinued     1 g 100 mL/hr over 30 Minutes Intravenous Every 24 hours 03/20/14 0255 03/21/14 1043   03/20/14 2200  vancomycin (VANCOCIN) IVPB 1000 mg/200 mL premix  Status:  Discontinued     1,000 mg 200 mL/hr over 60 Minutes Intravenous Every 24 hours 03/20/14 0321 03/21/14 1043   03/19/14 2300  ertapenem (INVANZ) 1 g in sodium chloride 0.9 % 50 mL IVPB     1 g 100 mL/hr over 30 Minutes Intravenous  Once 03/19/14 2248 03/20/14 0106   03/19/14 2245  vancomycin (VANCOCIN) IVPB 1000 mg/200 mL premix     1,000 mg 200 mL/hr over 60 Minutes Intravenous  Once 03/19/14 2232 03/20/14 0014          Objective:   Filed Vitals:   03/21/14 0637 03/21/14 1344 03/21/14 2027 03/22/14 0500  BP: 115/45 113/38 109/59 138/52  Pulse: 92 77 86 85  Temp: 98.6 F (37 C) 99.4 F (37.4 C) 98 F (36.7 C) 97.2 F (36.2 C)  TempSrc:  Oral Oral Oral Oral  Resp: 15 18 18 18   Height:      Weight:      SpO2: 100% 100% 100% 99%    Wt Readings from Last 3 Encounters:  03/20/14 73.664 kg (162 lb 6.4 oz)  03/13/14 75.751 kg (167 lb)  11/18/13 73.936 kg (163 lb)     Intake/Output Summary (Last 24 hours) at 03/22/14 0828 Last data filed at 03/22/14 0300  Gross per 24 hour  Intake 1283.33 ml  Output    150 ml  Net 1133.33 ml     Physical Exam  Awake Alert, Oriented X 3, No new F.N deficits, Normal affect .AT,PERRAL Supple Neck,No JVD, No cervical lymphadenopathy appriciated.  Symmetrical Chest wall movement, Good air movement bilaterally, CTAB RRR,No Gallops,Rubs or new Murmurs, No Parasternal Heave +ve B.Sounds, Abd Soft, No tenderness, No organomegaly appriciated, No rebound - guarding or rigidity. No Cyanosis, Clubbing or edema, No new Rash or bruise      Data Review   Micro Results No results found for this or any previous visit (from the past 240 hour(s)).  Radiology Reports   Ct Abdomen Pelvis W Contrast  03/19/2014   CLINICAL DATA:  Abdominal pain, low back pain, nausea.  EXAM: CT ABDOMEN AND PELVIS WITH CONTRAST  TECHNIQUE: Multidetector CT imaging of the abdomen and pelvis was performed using the standard protocol following bolus administration of intravenous contrast.  CONTRAST:  100mL OMNIPAQUE IOHEXOL 300 MG/ML  SOLN  COMPARISON:  None.  FINDINGS: Dependent atelectasis in the lung bases. Densely calcified coronary arteries. No effusions. Heart is upper limits normal in size. Oral contrast material is seen within the esophagus which may be related to dysmotility or reflux.  Prior cholecystectomy. Common bile duct is dilated, likely related to post cholecystectomy state. No focal abnormality within the liver, spleen, pancreas, adrenals. Areas of atrophy and scarring in the posterior upper and mid poles of the right kidney. Given the heavily calcified aorta and renal arteries, this is likely from  chronic renovascular disease. Small cyst in the midpole of the left kidney. No hydronephrosis.  Scattered colonic diverticulosis. Large stool burden throughout the colon. Small bowel is decompressed. Stomach is unremarkable.  Gas noted within the endometrial canal of the uterus. The sigmoid colon is contiguous with the fundus of the uterus and there are several diverticula in the region, but no inflammatory process or visible fistula, but fistula is a concern if the patient has not had recent instrumentation.  Gas noted within the soft tissues extending from the skin surface in the midline of the buttocks deep to near the coccyx compatible with large decubitus ulcer.  There is also a gas collection noted to the left of the aortic bifurcation between the aortic bifurcation and the left psoas muscle in the left retroperitoneum (image 47). This is adjacent to a severely degenerated L4-5 disc with vacuum disc noted, but no definite connection to the disc space. This conceivably could represent a large herniated disc fragment, but other cause of gas cannot be excluded. Again, no significant inflammation surrounding this area.  IMPRESSION: Gas within the scratch head gas fills the endometrial canal of the uterus. The sigmoid colon is a medially contiguous to the uterus with diverticulosis, but no inflammatory process noted at this time. 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 Given the lack of inflammation, this may be a chronic longstanding process.  Large sacral decubitus ulcer. Gas extends down to the coccyx. No bony changes to suggest osteomyelitis.  Small gas collection in the left retroperitoneum near the aortic bifurcation of unknown etiology. Question large herniated disc fragment from the adjacent degenerated disc. Again, no inflammation in this area to suggest active inflammatory process or to suggest this is an abscess.  These results were called  by telephone at the time of interpretation on 03/19/2014 at 9:55 PM to Dr. Pricilla LovelessSCOTT GOLDSTON , who verbally acknowledged these results.   Electronically Signed   By: Charlett NoseKevin  Dover M.D.  On: 03/19/2014 21:55    CBC  Recent Labs Lab 03/19/14 1935 03/20/14 0611 03/21/14 0701 03/22/14 0010  WBC 12.1* 10.8* 16.1* 13.9*  HGB 10.7* 10.7* 10.1* 7.1*  HCT 31.9* 32.9* 30.7* 21.7*  PLT 233 218 PLATELET CLUMPS NOTED ON SMEAR, COUNT APPEARS ADEQUATE 189  MCV 93.3 95.1 95.9 95.6  MCH 31.3 30.9 31.6 31.3  MCHC 33.5 32.5 32.9 32.7  RDW 13.6 13.8 13.8 13.9  LYMPHSABS 1.5 0.9  --   --   MONOABS 0.7 0.7  --   --   EOSABS 0.1 0.0  --   --   BASOSABS 0.0 0.0  --   --     Chemistries   Recent Labs Lab 03/19/14 1935 03/20/14 0611 03/21/14 0701 03/22/14 0010  NA 138 137 138 136*  K 4.6 4.2 4.6 4.4  CL 99 98 98 99  CO2 24 26 25 24   GLUCOSE 103* 113* 102* 117*  BUN 26* 24* 41* 56*  CREATININE 1.05 0.83 1.49* 1.79*  CALCIUM 10.0 9.4 9.1 8.6  AST 34 29  --   --   ALT 21 19  --   --   ALKPHOS 127* 119*  --   --   BILITOT 0.4 0.4  --   --    ------------------------------------------------------------------------------------------------------------------ estimated creatinine clearance is 23.5 ml/min (by C-G formula based on Cr of 1.79). ------------------------------------------------------------------------------------------------------------------ No results found for this basename: HGBA1C,  in the last 72 hours ------------------------------------------------------------------------------------------------------------------ No results found for this basename: CHOL, HDL, LDLCALC, TRIG, CHOLHDL, LDLDIRECT,  in the last 72 hours ------------------------------------------------------------------------------------------------------------------ No results found for this basename: TSH, T4TOTAL, FREET3, T3FREE, THYROIDAB,  in the last 72  hours ------------------------------------------------------------------------------------------------------------------ No results found for this basename: VITAMINB12, FOLATE, FERRITIN, TIBC, IRON, RETICCTPCT,  in the last 72 hours  Coagulation profile No results found for this basename: INR, PROTIME,  in the last 168 hours  No results found for this basename: DDIMER,  in the last 72 hours  Cardiac Enzymes No results found for this basename: CK, CKMB, TROPONINI, MYOGLOBIN,  in the last 168 hours ------------------------------------------------------------------------------------------------------------------ No components found with this basename: POCBNP,      Time Spent in minutes 35   SINGH,PRASHANT K M.D on 03/22/2014 at 8:28 AM  Between 7am to 7pm - Pager - [phone]  After 7pm go to [email or web address] - password [email or web address]  And look for the night coverage person covering for me after hours  [facility] Office  [phone]   **Disclaimer: This note may have been dictated with voice recognition software. Similar sounding words can inadvertently be transcribed and this note may contain transcription errors which may not have been corrected upon publication of note.**

## 2014-03-22 NOTE — Progress Notes (Signed)
Received call from CT regarding result, notified on call K.Schorr,NP with no new order.

## 2014-03-22 NOTE — Progress Notes (Signed)
CRITICAL VALUE ALERT  Critical value received:  Hemoglobin 6.5  Date of notification:  03/22/2014   Time of notification: 1915  Critical value read back:{yes   Nurse who received alert:  Ishmael HolterBrandyn Kao Berkheimer  MD notified (1st page):  Kirtland BouchardK. Schorr  Time of first page:  1935  MD notified (2nd page):  Time of second page:  Responding MD:  Merdis DelayK. Schorr  Time MD responded:  2007

## 2014-03-22 NOTE — Progress Notes (Signed)
Received verbal consent over the phone from patient's daughter Samul Dadaeggy Waye to get PRBCs. Also informed and received verbal consent for PICC line placement. Verified with  Dorathy KinsmanBrenda charge nurse.

## 2014-03-22 NOTE — Progress Notes (Signed)
Hypoglycemic Event  CBG: 55  Treatment: D50 IV 25 mL  Symptoms: None  Follow-up CBG: Time:2026 CBG Result:137  Possible Reasons for Event: Inadequate meal intake  Comments/MD notified:K.Schorr,NP    Miachel Rouxuliao, Charlies Rayburn Lucille C  Remember to initiate Hypoglycemia Order Set & complete

## 2014-03-22 NOTE — H&P (Signed)
Rochanda A Imran is an 80 y.o. female.   Chief Complaint: Pt with chronic back pain with previous back/ neck surgeries Has had increased back pain of recent Pt has been using walker to ambulate but spends lot of time in wheelchair Has known stage 4 sacral decubitus ulcer being treated by [facility]. CTA and MRI reveal paraspinal collection/poss hematoma Pt on Vanco and Meropenum IV Possible vascular involvement- but unlikely per Vascular surgery (Dr Kellie Simmering) after review of imaging Request has been made for consult for aspiration of collection/probable hematoma per [facility] Dr Barbie Banner has reviewed imaging Pt has been seen and examined  HPI: CVA; CHF; CAD; HTN; extr edema; HLD; PVD; B Cataracts  Past Medical History  Diagnosis Date  . CHF (congestive heart failure)   . Coronary artery disease   . Hypertension   . Arthritis   . Edema of extremities   . Anxiety   . HLD (hyperlipidemia)   . Peripheral vascular disease   . Stroke Jan 2015    left sided weakness, trouble speaking  . Cataracts, bilateral   . Detached retina     right eye  . Decubitus ulcer of sacral area     STAGE 4    Past Surgical History  Procedure Laterality Date  . Total knee arthroplasty Bilateral     knee replacements  . Lumbar disc surgery    . Cholecystectomy    . Posterior cervical fusion/foraminotomy N/A 12/28/2012    Procedure: POSTERIOR CERVICAL FUSION/FORAMINOTOMY LEVEL 3;  Surgeon: Kristeen Miss, MD;  Location: [place] [facility];  Service: Neurosurgery;  Laterality: N/A;  Posterior Cervical Three-Six Laminectomy and Fusion  . Angioplasty    . Tonsillectomy    . Joint replacement    . Tubal ligation    . Retinal detachment surgery      2x - right eye    Family History  Problem Relation Age of Onset  . Dementia Sister     x 3  . CAD Brother   . Breast cancer Sister     x 3  . Heart disease Sister   . Heart disease Son     x 2  . Cancer Son   . Hyperlipidemia Son   . Hypertension Son   . Alzheimer's  disease Sister   . Depression Daughter   . Hypertension Daughter   . Hyperlipidemia Daughter    Social History:  reports that she has never smoked. She has never used smokeless tobacco. She reports that she drinks alcohol. She reports that she does not use illicit drugs.  Allergies:  Allergies  Allergen Reactions  . Penicillins Hives, Shortness Of Breath and Nausea Only    Throat closed up and had hives and nausea. Tolerates carbapenems.    Medications Prior to Admission  Medication Sig Dispense Refill  . ALPRAZolam (XANAX) 0.25 MG tablet Take 0.25 mg by mouth at bedtime as needed for anxiety.      Marland Kitchen aspirin 325 MG tablet Take 325 mg by mouth daily.      . Calcium Carb-Cholecalciferol (CALCIUM 600 + D PO) Take 1 tablet by mouth daily.      . diclofenac sodium (VOLTAREN) 1 % GEL Apply 2 g topically 2 (two) times daily as needed (pain).      . furosemide (LASIX) 20 MG tablet Take 20 mg by mouth daily.      Marland Kitchen losartan (COZAAR) 50 MG tablet Take 50 mg by mouth daily.      . Menthol, Topical  Analgesic, (BIOFREEZE EX) Apply 1 application topically daily as needed (pain).      . methocarbamol (ROBAXIN) 500 MG tablet Take 500 mg by mouth every 6 (six) hours as needed for muscle spasms.       . metoprolol (LOPRESSOR) 50 MG tablet Take 50 mg by mouth 2 (two) times daily.      . Multiple Vitamin (MULTIVITAMIN WITH MINERALS) TABS Take 1 tablet by mouth daily.      . naproxen sodium (ANAPROX) 220 MG tablet Take 220 mg by mouth 2 (two) times daily as needed (pain).       Marland Kitchen oxyCODONE-acetaminophen (PERCOCET) 10-325 MG per tablet Take 1 tablet by mouth every 6 (six) hours.      . pramipexole (MIRAPEX) 0.5 MG tablet Take 0.5 mg by mouth at bedtime.       . pravastatin (PRAVACHOL) 80 MG tablet Take 80 mg by mouth every evening.         Results for orders placed during the hospital encounter of 03/19/14 (from the past 48 hour(s))  GLUCOSE, CAPILLARY     Status: None   Collection Time    03/20/14  11:34 AM      Result Value Ref Range   Glucose-Capillary 96  70 - 99 mg/dL  GLUCOSE, CAPILLARY     Status: Abnormal   Collection Time    03/20/14  3:49 PM      Result Value Ref Range   Glucose-Capillary 106 (*) 70 - 99 mg/dL  BLOOD GAS, ARTERIAL     Status: Abnormal   Collection Time    03/20/14  4:30 PM      Result Value Ref Range   O2 Content 3.0     Delivery systems NASAL CANNULA     pH, Arterial 7.433  7.350 - 7.450   pCO2 arterial 35.8  35.0 - 45.0 mmHg   pO2, Arterial 110.0 (*) 80.0 - 100.0 mmHg   Bicarbonate 23.5  20.0 - 24.0 mEq/L   TCO2 24.6  0 - 100 mmol/L   Acid-base deficit 0.2  0.0 - 2.0 mmol/L   O2 Saturation 99.6     Patient temperature 98.6     Collection site RIGHT RADIAL     Drawn by [id number]     Sample type ARTERIAL DRAW     Allens test (pass/fail) PASS  PASS  GLUCOSE, CAPILLARY     Status: Abnormal   Collection Time    03/20/14  8:20 PM      Result Value Ref Range   Glucose-Capillary 122 (*) 70 - 99 mg/dL   Comment 1 Notify RN    GLUCOSE, CAPILLARY     Status: Abnormal   Collection Time    03/21/14 12:37 AM      Result Value Ref Range   Glucose-Capillary 100 (*) 70 - 99 mg/dL   Comment 1 Notify RN    GLUCOSE, CAPILLARY     Status: None   Collection Time    03/21/14  4:17 AM      Result Value Ref Range   Glucose-Capillary 76  70 - 99 mg/dL   Comment 1 Notify RN    CBC     Status: Abnormal   Collection Time    03/21/14  7:01 AM      Result Value Ref Range   WBC 16.1 (*) 4.0 - 10.5 K/uL   Comment: WHITE COUNT CONFIRMED ON SMEAR   RBC 3.20 (*) 3.87 - 5.11 MIL/uL   Hemoglobin 10.1 (*)  12.0 - 15.0 g/dL   HCT 30.7 (*) 36.0 - 46.0 %   MCV 95.9  78.0 - 100.0 fL   MCH 31.6  26.0 - 34.0 pg   MCHC 32.9  30.0 - 36.0 g/dL   RDW 13.8  11.5 - 15.5 %   Platelets    150 - 400 K/uL   Value: PLATELET CLUMPS NOTED ON SMEAR, COUNT APPEARS ADEQUATE  BASIC METABOLIC PANEL     Status: Abnormal   Collection Time    03/21/14  7:01 AM      Result Value Ref Range    Sodium 138  137 - 147 mEq/L   Potassium 4.6  3.7 - 5.3 mEq/L   Chloride 98  96 - 112 mEq/L   CO2 25  19 - 32 mEq/L   Glucose, Bld 102 (*) 70 - 99 mg/dL   BUN 41 (*) 6 - 23 mg/dL   Comment: DELTA CHECK NOTED   Creatinine, Ser 1.49 (*) 0.50 - 1.10 mg/dL   Comment: DELTA CHECK NOTED   Calcium 9.1  8.4 - 10.5 mg/dL   GFR calc non Af Amer 32 (*) >90 mL/min   GFR calc Af Amer 37 (*) >90 mL/min   Comment: (NOTE)     The eGFR has been calculated using the CKD EPI equation.     This calculation has not been validated in all clinical situations.     eGFR's persistently <90 mL/min signify possible Chronic Kidney     Disease.   Anion gap 15  5 - 15  GLUCOSE, CAPILLARY     Status: Abnormal   Collection Time    03/21/14  7:57 AM      Result Value Ref Range   Glucose-Capillary 108 (*) 70 - 99 mg/dL  GLUCOSE, CAPILLARY     Status: Abnormal   Collection Time    03/21/14 11:54 AM      Result Value Ref Range   Glucose-Capillary 68 (*) 70 - 99 mg/dL  GLUCOSE, CAPILLARY     Status: None   Collection Time    03/21/14 12:23 PM      Result Value Ref Range   Glucose-Capillary 96  70 - 99 mg/dL  GLUCOSE, CAPILLARY     Status: Abnormal   Collection Time    03/21/14  3:58 PM      Result Value Ref Range   Glucose-Capillary 110 (*) 70 - 99 mg/dL  GLUCOSE, CAPILLARY     Status: Abnormal   Collection Time    03/21/14  8:30 PM      Result Value Ref Range   Glucose-Capillary 123 (*) 70 - 99 mg/dL  CBC     Status: Abnormal   Collection Time    03/22/14 12:10 AM      Result Value Ref Range   WBC 13.9 (*) 4.0 - 10.5 K/uL   RBC 2.27 (*) 3.87 - 5.11 MIL/uL   Hemoglobin 7.1 (*) 12.0 - 15.0 g/dL   Comment: DELTA CHECK NOTED     REPEATED TO VERIFY   HCT 21.7 (*) 36.0 - 46.0 %   MCV 95.6  78.0 - 100.0 fL   MCH 31.3  26.0 - 34.0 pg   MCHC 32.7  30.0 - 36.0 g/dL   RDW 13.9  11.5 - 15.5 %   Platelets 189  150 - 400 K/uL  BASIC METABOLIC PANEL     Status: Abnormal   Collection Time    03/22/14 12:10 AM  Result Value Ref Range   Sodium 136 (*) 137 - 147 mEq/L   Potassium 4.4  3.7 - 5.3 mEq/L   Chloride 99  96 - 112 mEq/L   CO2 24  19 - 32 mEq/L   Glucose, Bld 117 (*) 70 - 99 mg/dL   BUN 56 (*) 6 - 23 mg/dL   Creatinine, Ser 1.79 (*) 0.50 - 1.10 mg/dL   Calcium 8.6  8.4 - 10.5 mg/dL   GFR calc non Af Amer 26 (*) >90 mL/min   GFR calc Af Amer 30 (*) >90 mL/min   Comment: (NOTE)     The eGFR has been calculated using the CKD EPI equation.     This calculation has not been validated in all clinical situations.     eGFR's persistently <90 mL/min signify possible Chronic Kidney     Disease.   Anion gap 13  5 - 15  GLUCOSE, CAPILLARY     Status: Abnormal   Collection Time    03/22/14 12:21 AM      Result Value Ref Range   Glucose-Capillary 114 (*) 70 - 99 mg/dL  GLUCOSE, CAPILLARY     Status: None   Collection Time    03/22/14  4:32 AM      Result Value Ref Range   Glucose-Capillary 82  70 - 99 mg/dL  GLUCOSE, CAPILLARY     Status: Abnormal   Collection Time    03/22/14  7:47 AM      Result Value Ref Range   Glucose-Capillary 100 (*) 70 - 99 mg/dL   Comment 1 Notify RN     Mr Lumbar Spine W Wo Contrast  03/21/2014   CLINICAL DATA:  Back pain. Sacral decubitus. Previous lumbar surgery.  EXAM: MRI LUMBAR SPINE WITHOUT AND WITH CONTRAST  TECHNIQUE: Multiplanar and multiecho pulse sequences of the lumbar spine were obtained without and with intravenous contrast.  CONTRAST:  20m MULTIHANCE GADOBENATE DIMEGLUMINE 529 MG/ML IV SOLN  COMPARISON:  CT abdomen and pelvis 03/19/2014. CT myelogram 11/25/2007.  FINDINGS: Chronic spondylosis is noted at L1-2, L3-4, L4-5, and L5-S1 with disc space narrowing. Discal hyperintensity at L4-5 is non worrisome, correlating with the recent CT which showed vacuum phenomenon and minor endplate enhancement postcontrast.  At L2, there is a fluid-filled cavity which splays the vertebral body. The inferior endplate is displaced downward, and the larger superior  portion of the vertebral body remains normally aligned with the thoracolumbar spine. This fluid-filled cavity demonstrated the same density as the adjacent aorta on the prior CT. This cavity today Is larger when compared with a similar sagittal image on the CT abdomen and pelvis from 2 days ago. 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 It is unclear if the fluid collection, which likely represents a pseudoaneurysm, represents rupture of a small aortic diverticulum which fractured the vertebral body, or if there was a compression fracture which lacerated the aorta, resulting in a pseudoaneurysm. CT angiography of the abdominal aorta, using dissection protocol to include pre and post-contrast images, is recommended for further evaluation.  Post infusion imaging demonstrates cloudlike enhancement of this L2 fluid cavity, suggesting blood pool effect.  Moderately severe spinal stenosis at the L3-4 level relates to posterior element hypertrophy and central protrusion. There has been previous laminectomy at this level. The disc space is almost completely collapsed. Moderately severe spinal stenosis is also present at L4-5 with central protrusion and posterior element hypertrophy with partial  regrowth following laminectomy. Disc space narrowing at L5-S1 is noted without focal protrusion.  The air containing structure in the left retroperitoneum on CT does not clearly connect with the L4-5 disc space.  Incompletely evaluated lower sacrum with regard to the decubitus. If further investigation desired, targeted MR to the sacral area recommended.  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 See discussion above. CTA of the abdominal aorta using dissection protocol recommended. Findings discussed with care team.  Severe lumbar spondylosis with stenosis at  L3-4 and L4-5. No features concerning for osteomyelitis or discitis however.  No clear-cut discal migration into the retroperitoneum on the left to correlate with the air containing structure noted on prior CT.   Electronically Signed   By: Rolla Flatten M.D.   On: 03/21/2014 20:59   Ct Angio Abd/pel W/ And/or W/o  03/21/2014   CLINICAL DATA:  Query aortic pseudoaneurysm on MRI lumbar spine.  EXAM: CTA ABDOMEN AND PELVIS wITHOUT AND WITH CONTRAST  TECHNIQUE: Multidetector CT imaging of the abdomen and pelvis was performed using the standard protocol during bolus administration of intravenous contrast. Multiplanar reconstructed images and MIPs were obtained and reviewed to evaluate the vascular anatomy.  CONTRAST:  154m OMNIPAQUE IOHEXOL 350 MG/ML SOLN  COMPARISON:  MRI lumbar spine 03/21/2014. CT abdomen and pelvis 03/19/2014.  FINDINGS: Noncontrast CT images demonstrate extensive calcification throughout the abdominal aorta and all major aortic branch vessels. Retrocrural and retroperitoneal fluid extending to the left paraspinal and psoas region with increased density consistent with infection, hemorrhage or hematoma.  Contrast-enhanced images are obtained in the aortic phase. Lung bases demonstrate diffuse esophageal dilatation with fluid consistent with reflux or dysmotility. Moderate-sized bilateral pleural effusions with bilateral basilar atelectasis or consolidation. Cardiac enlargement.  The abdominal aorta is normal caliber. No evidence of aneurysm or dissection. The abdominal aorta, celiac axis, superior mesenteric artery, single left and duplicated right renal arteries, inferior mesenteric artery, and the left iliac, external iliac, internal iliac, and femoral arteries are patent. There is apparent focal occlusion with no flow demonstrated in the right common iliac artery. There is reconstitution of flow in the right internal and external iliac arteries with flow extending to the right femoral artery.   Since the previous study, there is interval development of angulation and distraction with bone loss through the body of the L2 vertebra. There is anterior displacement of the inferior body of L2 with posterior angulation with respect to the superior body. Interval distraction of the fragments. Bone cortical margins are indistinct through this area with poor definition between bone and surrounding soft tissues. This suggests a destructive bone process such as infection or metastasis. There additional lucent changes throughout the visualized thoracic and lumbar spine which could indicate diffuse metastatic disease or marked demineralization. The fluid collections demonstrated in the psoas and retrocrural regions bilaterally, greater on the left are new since the previous study. There is no obvious active contrast extravasation noted. However, the posterior margins of the aorta at the level of L2 are indistinct. Given the presence of abnormal changes in the bone and new fluid collections, presumed to represent hematoma, bleeding from the aorta may be present. Alternatively, there could of been erosion into the lumbar vessel. Left periaortic gas collection remains present without change.  The liver, spleen, gallbladder, pancreas, adrenal glands, and retroperitoneal lymph nodes are unremarkable. There is flattening of the inferior vena cava which may indicate hypovolemia. There  is scarring in the kidneys bilaterally with asymmetric scarring in the posterior aspect of the right kidney. There is a decreased nephrogram in the posterior right kidney suggesting renal vascular disease. Stomach, small bowel, and colon are not abnormally distended. Ventral infraumbilical abdominal wall hernia containing fat.  Pelvis: The appendix is normal. Bladder is diffusely distended without wall thickening. No gas is demonstrated in the bladder. Gas is again demonstrated in the endometrium. This would be suspicious for infection or  fistula. No free or loculated pelvic fluid collections. Diverticula in the sigmoid colon. Infiltration in the left ileus psoas region likely arises from the retroperitoneal hematoma above. Deep decubitus ulcerations over the sacrococcygeal spine again demonstrated.  Review of the MIP images confirms the above findings.  IMPRESSION: Interval development of distraction and angulation through the midbody of L2 with new retroperitoneal and iliopsoas hematoma, left greater than right. No definite active contrast extravasation is identified although loss of distinction of the posterior wall of the abdominal aorta may indicate aortic bleed.  Changes in the vertebral body suggest either infection or metastatic disease with pathologic fracture.  Diffuse vascular disease. 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 Infarct in the posterior right kidney. Bilateral pleural effusions with basilar atelectasis. Changes of large decubitus ulcers, gas in the left paraspinal region, and gas in the endometrium were present on prior study and are without change.  These results were called by telephone at the time of interpretation on 03/21/2014 at 11:20 PM to The patient's nurse, Imelda, who verbally acknowledged these results.   Electronically Signed   By: Lucienne Capers M.D.   On: 03/21/2014 23:26    Review of Systems  Constitutional: Negative for fever and weight loss.  Respiratory: Negative for cough and shortness of breath.   Cardiovascular: Negative for chest pain.  Gastrointestinal: Negative for nausea, vomiting and abdominal pain.  Musculoskeletal: Positive for back pain.  Neurological: Positive for weakness. Negative for dizziness.    Blood pressure 138/52, pulse 85, temperature 97.2 F (36.2 C), temperature source Oral, resp. rate 18, height 5' 1.81" (1.57 m), weight 73.664 kg (162 lb 6.4 oz), SpO2 100.00%. Physical Exam  Constitutional: She appears  well-nourished.  Cardiovascular: Normal rate.   No murmur heard. Respiratory: Effort normal. She has no wheezes.  GI: Soft. There is no tenderness.  Musculoskeletal: She exhibits edema.  Moves all 4s to command L side weaker: Hx CVA    Neurological:  Pt can communicate Answers appropriately Goes quickly back to sleep---prob medications   Skin: Skin is dry.  Psychiatric:  consented MPOA--dtr in room     Assessment/Plan Low back pain Paraspinal fluid collection/hematoma Scheduled for aspiration of same Pt and dtr aware of procedure benefits and risks and agreeable to proceed Consent signed and in chart  Claron Rosencrans A 03/22/2014, 10:59 AM

## 2014-03-23 ENCOUNTER — Inpatient Hospital Stay (HOSPITAL_COMMUNITY): Payer: Medicare Other

## 2014-03-23 DIAGNOSIS — Z515 Encounter for palliative care: Secondary | ICD-10-CM

## 2014-03-23 DIAGNOSIS — R531 Weakness: Secondary | ICD-10-CM

## 2014-03-23 LAB — CBC
HCT: 22.1 % — ABNORMAL LOW (ref 36.0–46.0)
Hemoglobin: 7.3 g/dL — ABNORMAL LOW (ref 12.0–15.0)
MCH: 30.8 pg (ref 26.0–34.0)
MCHC: 33 g/dL (ref 30.0–36.0)
MCV: 93.2 fL (ref 78.0–100.0)
PLATELETS: 207 10*3/uL (ref 150–400)
RBC: 2.37 MIL/uL — ABNORMAL LOW (ref 3.87–5.11)
RDW: 14.4 % (ref 11.5–15.5)
WBC: 8.7 10*3/uL (ref 4.0–10.5)

## 2014-03-23 LAB — URINE CULTURE
COLONY COUNT: NO GROWTH
CULTURE: NO GROWTH

## 2014-03-23 LAB — BASIC METABOLIC PANEL
ANION GAP: 11 (ref 5–15)
BUN: 50 mg/dL — ABNORMAL HIGH (ref 6–23)
CALCIUM: 8.4 mg/dL (ref 8.4–10.5)
CHLORIDE: 104 meq/L (ref 96–112)
CO2: 24 mEq/L (ref 19–32)
CREATININE: 1.1 mg/dL (ref 0.50–1.10)
GFR, EST AFRICAN AMERICAN: 53 mL/min — AB (ref >90)
GFR, EST NON AFRICAN AMERICAN: 46 mL/min — AB (ref >90)
Glucose, Bld: 115 mg/dL — ABNORMAL HIGH (ref 70–99)
Potassium: 4.3 mEq/L (ref 3.7–5.3)
Sodium: 139 mEq/L (ref 137–147)

## 2014-03-23 LAB — RETICULOCYTES
RBC.: 2.8 MIL/uL — AB (ref 3.87–5.11)
RETIC COUNT ABSOLUTE: 61.6 10*3/uL (ref 19.0–186.0)
RETIC CT PCT: 2.2 % (ref 0.4–3.1)

## 2014-03-23 LAB — GLUCOSE, CAPILLARY
GLUCOSE-CAPILLARY: 104 mg/dL — AB (ref 70–99)
GLUCOSE-CAPILLARY: 115 mg/dL — AB (ref 70–99)
GLUCOSE-CAPILLARY: 115 mg/dL — AB (ref 70–99)
GLUCOSE-CAPILLARY: 123 mg/dL — AB (ref 70–99)
GLUCOSE-CAPILLARY: 136 mg/dL — AB (ref 70–99)
Glucose-Capillary: 117 mg/dL — ABNORMAL HIGH (ref 70–99)
Glucose-Capillary: 128 mg/dL — ABNORMAL HIGH (ref 70–99)

## 2014-03-23 LAB — IRON AND TIBC
Iron: 76 ug/dL (ref 42–135)
SATURATION RATIOS: 36 % (ref 20–55)
TIBC: 210 ug/dL — ABNORMAL LOW (ref 250–470)
UIBC: 134 ug/dL (ref 125–400)

## 2014-03-23 LAB — HEMOGLOBIN AND HEMATOCRIT, BLOOD
HCT: 25.3 % — ABNORMAL LOW (ref 36.0–46.0)
Hemoglobin: 8.5 g/dL — ABNORMAL LOW (ref 12.0–15.0)

## 2014-03-23 LAB — FERRITIN: FERRITIN: 190 ng/mL (ref 10–291)

## 2014-03-23 LAB — FOLATE

## 2014-03-23 LAB — PREPARE RBC (CROSSMATCH)

## 2014-03-23 LAB — OSMOLALITY: Osmolality: 298 mOsm/kg (ref 275–300)

## 2014-03-23 LAB — VITAMIN B12: Vitamin B-12: 999 pg/mL — ABNORMAL HIGH (ref 211–911)

## 2014-03-23 MED ORDER — FENTANYL CITRATE 0.05 MG/ML IJ SOLN
INTRAMUSCULAR | Status: AC
  Filled 2014-03-23: qty 2

## 2014-03-23 MED ORDER — MIDAZOLAM HCL 2 MG/2ML IJ SOLN
INTRAMUSCULAR | Status: AC
  Filled 2014-03-23: qty 2

## 2014-03-23 MED ORDER — FENTANYL CITRATE 0.05 MG/ML IJ SOLN
INTRAMUSCULAR | Status: AC | PRN
  Administered 2014-03-23: 25 ug via INTRAVENOUS

## 2014-03-23 MED ORDER — LIDOCAINE HCL 1 % IJ SOLN
INTRAMUSCULAR | Status: AC
Start: 2014-03-23 — End: 2014-03-24
  Filled 2014-03-23: qty 10

## 2014-03-23 MED ORDER — MORPHINE SULFATE 2 MG/ML IJ SOLN
1.0000 mg | INTRAMUSCULAR | Status: DC | PRN
  Administered 2014-03-23 – 2014-03-27 (×11): 1 mg via INTRAVENOUS
  Filled 2014-03-23 (×12): qty 1

## 2014-03-23 MED ORDER — FUROSEMIDE 10 MG/ML IJ SOLN
10.0000 mg | INTRAMUSCULAR | Status: AC
  Administered 2014-03-23 (×2): 10 mg via INTRAVENOUS
  Filled 2014-03-23: qty 1

## 2014-03-23 NOTE — Procedures (Signed)
CT guided aspiration of left retroperitoneal fluid.  2 ml of bloody fluid was aspirated.  Findings compatible with a hematoma.  Fluid send for culture.

## 2014-03-23 NOTE — Progress Notes (Signed)
Patient Demographics  [name], is a 80 y.o. female, DOB - 12-29-1930, WUJ:[id number]  Admit date - 03/19/2014   Admitting Physician Eduard Clos, MD  Outpatient Primary MD for the patient is [name], MD  LOS - 4   Chief Complaint  Patient presents with  . Back Pain       Subjective:   [name] today has, No headache, No chest pain, No abdominal pain - No Nausea, No new weakness tingling or numbness, No Cough - SOB. ++ low back pain, but somewhat better in terms of pain.   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 Surgery following. No need for surgery. Wound care following.   We wanted to get an MRI of her back to look for discitis etc., however patient at this time says she does not want any further testing or invasive procedures, she wants care to be directed towards comfort. She says I am dying this is my time and I want to pass away and comfort. At that point but if there was also good. However in the evening family changed opinion and wanted further testing done.   MRI of the back was then ordered yesterday evening which showed changes suggestive of an L-spine fluid therapy at L4 level also there was a suspicion of a to improve fistula. A CT angiogram of the abdomen and pelvis was also done which was inconclusive. I discussed her status with Dr. Hart Rochester vascular surgeon in detail, he has ruled out retroperitoneal bleed or any aortic - Vertebral communication.   I have discussed her case with Dr. Franky Macho in Dr. Danielle Dess  neurosurgeon, who suggests patient is not an operative candidate which I concur with, they have reviewed the scans, Dr. Danielle Dess recommends general medical treatment of decline comfort care.    I have requested IR to do a CT-guided aspiration of the L-spine cavity Gram stain and culture. She's currently on vancomycin and meropenem which we be continued for now.    Patient's overall prognosis appears to be poor. She herself wants to be comfortable only. However reasonably reversible pathology is followed it might be right to try to medical treatment. Consensus with family members his general medical treatment, if no improvement in a few days with focus on comfort care. She is DO NOT RESUSCITATE.     2. 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 Currently compensated from CHF standpoint no acute, continue beta blocker fluid and salt restriction.      3. Anemia of chronic disease. Likely for comminution from IV fluids, reviewed her CT MRI in detail with vascular surgery, no signs of the retroperitoneal bleed, will check Hemoccult blood and anemia panel, she received one unit of packed RBC on 03/22/2014 we'll get another unit on 03/23/2014. We'll monitor H&H closely. Continue PPI. In my opinion most of the fall in hemoglobin is due to dilution from IV fluids. She was clinically quite dehydrated prior to admission.  4. ARF - likely due to prerenal azotemia, also some injury from CT IV contrast which was needed to rule out aortic dissection. Resolved after hydration.    5. Stage IV chronic stage IV sacro- coccygeal Decub Ulcer . Wound care following. Seen by surgery. Not a surgical need at this time.     6. 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       Code Status: DNR D/W daughter  Family Communication: Daughter on a daily basis  Disposition Plan: Residential hospice versus  SNF   Procedures CT scan abdomen pelvis, IR to try CT-guided L-spine aspiration, right IJ central line placement by PC CM   Consults [facility], palliative care, Vas Surgey, N surgery Dr Franky Machoabbell and Dr Danielle DessElsner over the phone on 03-22-14, PC CM for right IJ central line placement   Medications  Scheduled Meds: . feeding supplement (ENSURE)  1 Container Oral BID BM  . furosemide  10 mg Intravenous Q4H  . lactose free nutrition  237 mL Oral Q24H  . meropenem (MERREM) IV  500 mg Intravenous Q12H  . metoprolol  2.5 mg Intravenous 4 times per day  . morphine  30 mg Oral Q12H  .  morphine injection  4 mg Intravenous Q2H  . pantoprazole (PROTONIX) IV  40 mg Intravenous Q12H  . sodium chloride  10-40 mL Intracatheter Q12H  . vancomycin  750 mg Intravenous Q24H   Continuous Infusions: . dextrose 5 % and 0.45% NaCl 75 mL/hr at 03/22/14 2117   PRN Meds:.diclofenac sodium, diphenhydrAMINE, hydrALAZINE, naLOXone (NARCAN)  injection, ondansetron (ZOFRAN) IV, sodium chloride, sodium chloride  DVT Prophylaxis    SCD  Lab Results  Component Value Date   PLT 207 03/23/2014    Antibiotics     Anti-infectives   Start     Dose/Rate Route Frequency Ordered Stop   03/22/14 0200  meropenem (MERREM) 500 mg in sodium chloride 0.9 % 50 mL IVPB     500 mg 100 mL/hr over 30 Minutes Intravenous Every 12 hours 03/21/14 1701     03/22/14 0100  vancomycin (VANCOCIN) IVPB 750 mg/150 ml premix     750 mg 150 mL/hr over 60 Minutes Intravenous Every 24 hours 03/21/14 1701     03/20/14 2200  ertapenem (INVANZ) 1 g in sodium chloride 0.9 % 50 mL IVPB  Status:  Discontinued     1 g 100 mL/hr over 30 Minutes Intravenous Every 24 hours 03/20/14 0255 03/21/14 1043   03/20/14 2200  vancomycin (VANCOCIN) IVPB 1000 mg/200 mL premix  Status:  Discontinued     1,000 mg 200 mL/hr over 60 Minutes Intravenous Every 24 hours 03/20/14 0321 03/21/14 1043   03/19/14 2300  ertapenem (INVANZ) 1 g in sodium chloride 0.9 % 50 mL IVPB      1 g 100 mL/hr over 30 Minutes Intravenous  Once 03/19/14 2248 03/20/14 0106   03/19/14 2245  vancomycin (VANCOCIN) IVPB 1000 mg/200 mL premix     1,000 mg 200 mL/hr over 60 Minutes Intravenous  Once 03/19/14 2232 03/20/14 0014          Objective:   Filed Vitals:   03/22/14 2110 03/23/14 0027 03/23/14 0428 03/23/14 0630  BP: 117/39 114/58 107/58 98/58  Pulse: 97  57 95  Temp: 97.9 F (36.6 C)  98.5 F (36.9 C)   TempSrc: Axillary  Axillary   Resp: 14  12   Height:      Weight:  SpO2: 98%  98%     Wt Readings from Last 3 Encounters:  03/20/14 73.664 kg (162 lb 6.4 oz)  03/13/14 75.751 kg (167 lb)  11/18/13 73.936 kg (163 lb)     Intake/Output Summary (Last 24 hours) at 03/23/14 1013 Last data filed at 03/23/14 0926  Gross per 24 hour  Intake 873.75 ml  Output   2750 ml  Net -1876.25 ml     Physical Exam  Awake Alert, Oriented X 2, No new F.N deficits, Normal affect [place].AT,PERRAL Supple Neck,No JVD, No cervical lymphadenopathy appriciated.  Symmetrical Chest wall movement, Good air movement bilaterally, CTAB RRR,No Gallops,Rubs or new Murmurs, No Parasternal Heave +ve B.Sounds, Abd Soft, No tenderness, No organomegaly appriciated, No rebound - guarding or rigidity. No Cyanosis, Clubbing or edema, No new Rash or bruise      Data Review   Micro Results Recent Results (from the past 240 hour(s))  CULTURE, BLOOD (ROUTINE X 2)     Status: None   Collection Time    03/22/14 10:00 AM      Result Value Ref Range Status   Specimen Description BLOOD LEFT ANTECUBITAL   Final   Special Requests BOTTLES DRAWN AEROBIC ONLY 4CC   Final   Culture  Setup Time     Final   Value: 03/22/2014 16:16     Performed at [facility]   Culture     Final   Value:        BLOOD CULTURE RECEIVED NO GROWTH TO DATE CULTURE WILL BE HELD FOR 5 DAYS BEFORE ISSUING A FINAL NEGATIVE REPORT     Performed at [facility]   Report Status PENDING   Incomplete     Radiology Reports   Ct Abdomen Pelvis W Contrast  03/19/2014   CLINICAL DATA:  Abdominal pain, low back pain, nausea.  EXAM: CT ABDOMEN AND PELVIS WITH CONTRAST  TECHNIQUE: Multidetector CT imaging of the abdomen and pelvis was performed using the standard protocol following bolus administration of intravenous contrast.  CONTRAST:  OMNIPAQUE IOHEXOL 300 MG/ML  SOLN  COMPARISON:  None.  FINDINGS: Dependent atelectasis in the lung bases. Densely calcified coronary arteries. No effusions. Heart is upper limits normal in size. Oral contrast material is seen within the esophagus which may be related to dysmotility or reflux.  Prior cholecystectomy. Common bile duct is dilated, likely related to post cholecystectomy state. No focal abnormality within the liver, spleen, pancreas, adrenals. Areas of atrophy and scarring in the posterior upper and mid poles of the right kidney. Given the heavily calcified aorta and renal arteries, this is likely from chronic renovascular disease. Small cyst in the midpole of the left kidney. No hydronephrosis.  Scattered colonic diverticulosis. Large stool burden throughout the colon. Small bowel is decompressed. Stomach is unremarkable.  Gas noted within the endometrial canal of the uterus. The sigmoid colon is contiguous with the fundus of the uterus and there are several diverticula in the region, but no inflammatory process or visible fistula, but fistula is a concern if the patient has not had recent instrumentation.  Gas noted within the soft tissues extending from the skin surface in the midline of the buttocks deep to near the coccyx compatible with large decubitus ulcer.  There is also a gas collection noted to the left of the aortic bifurcation between the aortic bifurcation and the left psoas muscle in the left retroperitoneum (image 47). This is adjacent to a severely degenerated L4-5 disc with vacuum disc  noted, but no definite connection to the disc space. This  conceivably could represent a large herniated disc fragment, but other cause of gas cannot be excluded. Again, no significant inflammation surrounding this area.  IMPRESSION: Gas within the scratch head gas fills the endometrial canal of the uterus. The sigmoid colon is a medially contiguous to the uterus with diverticulosis, but no inflammatory process noted at this time. 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 Given the lack of inflammation, this may be a chronic longstanding process.  Large sacral decubitus ulcer. Gas extends down to the coccyx. No bony changes to suggest osteomyelitis.  Small gas collection in the left retroperitoneum near the aortic bifurcation of unknown etiology. Question large herniated disc fragment from the adjacent degenerated disc. Again, no inflammation in this area to suggest active inflammatory process or to suggest this is an abscess.  These results were called by telephone at the time of interpretation on 03/19/2014 at 9:55 PM to Dr. Pricilla Loveless , who verbally acknowledged these results.   Electronically Signed   By: Charlett Nose M.D.   On: 03/19/2014 21:55    CBC  Recent Labs Lab 03/19/14 1935 03/20/14 0611 03/21/14 0701 03/22/14 0010 03/22/14 1840 03/22/14 2330 03/23/14 0520  WBC 12.1* 10.8* 16.1* 13.9*  --   --  8.7  HGB 10.7* 10.7* 10.1* 7.1* 6.5* 7.6* 7.3*  HCT 31.9* 32.9* 30.7* 21.7* 19.4* 22.6* 22.1*  PLT 233 218 PLATELET CLUMPS NOTED ON SMEAR, COUNT APPEARS ADEQUATE 189  --   --  207  MCV 93.3 95.1 95.9 95.6  --   --  93.2  MCH 31.3 30.9 31.6 31.3  --   --  30.8  MCHC 33.5 32.5 32.9 32.7  --   --  33.0  RDW 13.6 13.8 13.8 13.9  --   --  14.4  LYMPHSABS 1.5 0.9  --   --   --   --   --   MONOABS 0.7 0.7  --   --   --   --   --   EOSABS 0.1 0.0  --   --   --   --   --   BASOSABS 0.0 0.0  --   --   --   --   --     Chemistries   Recent Labs Lab 03/19/14 1935 03/20/14 0611  03/21/14 0701 03/22/14 0010 03/23/14 0520  NA 138 137 138 136* 139  K 4.6 4.2 4.6 4.4 4.3  CL 99 98 98 99 104  CO2 24 26 25 24 24   GLUCOSE 103* 113* 102* 117* 115*  BUN 26* 24* 41* 56* 50*  CREATININE 1.05 0.83 1.49* 1.79* 1.10  CALCIUM 10.0 9.4 9.1 8.6 8.4  AST 34 29  --   --   --   ALT 21 19  --   --   --   ALKPHOS 127* 119*  --   --   --   BILITOT 0.4 0.4  --   --   --    ------------------------------------------------------------------------------------------------------------------ estimated creatinine clearance is 38.2 ml/min (by C-G formula based on Cr of 1.1). ------------------------------------------------------------------------------------------------------------------ No results found for this basename: HGBA1C,  in the last 72 hours ------------------------------------------------------------------------------------------------------------------ No results found for this basename: CHOL, HDL, LDLCALC, TRIG, CHOLHDL, LDLDIRECT,  in the last 72 hours ------------------------------------------------------------------------------------------------------------------ No results found for this basename: TSH, T4TOTAL, FREET3, T3FREE, THYROIDAB,  in the last 72 hours ------------------------------------------------------------------------------------------------------------------  Recent  Labs  03/22/14 1840  RETICCTPCT 2.4    Coagulation profile  Recent Labs Lab 03/22/14 1840  INR 1.13    No results found for this basename: DDIMER,  in the last 72 hours  Cardiac Enzymes No results found for this basename: CK, CKMB, TROPONINI, MYOGLOBIN,  in the last 168 hours ------------------------------------------------------------------------------------------------------------------ No components found with this basename: POCBNP,      Time Spent in minutes 35   Anjolie Majer K M.D on 03/23/2014 at 10:13 AM  Between 7am to 7pm - Pager - [phone]  After 7pm go to  [email or web address] - password [email or web address]  And look for the night coverage person covering for me after hours  [facility] Office  [phone]   **Disclaimer: This note may have been dictated with voice recognition software. Similar sounding words can inadvertently be transcribed and this note may contain transcription errors which may not have been corrected upon publication of note.**

## 2014-03-23 NOTE — Progress Notes (Signed)
Progress Note from the Palliative Medicine Team at [facility]  Subjective:  -patient is weaker today, family at bedside  -awaiting diagnostic aspiration, treatment plan is dependant on its outcome     Objective: Allergies  Allergen Reactions  . Penicillins Hives, Shortness Of Breath and Nausea Only    Throat closed up and had hives and nausea. Tolerates carbapenems.   Scheduled Meds: . feeding supplement (ENSURE)  1 Container Oral BID BM  . fentaNYL      . furosemide  10 mg Intravenous Q4H  . lactose free nutrition  237 mL Oral Q24H  . lidocaine      . meropenem (MERREM) IV  500 mg Intravenous Q12H  . metoprolol  2.5 mg Intravenous 4 times per day  . morphine  30 mg Oral Q12H  .  morphine injection  4 mg Intravenous Q2H  . pantoprazole (PROTONIX) IV  40 mg Intravenous Q12H  . sodium chloride  10-40 mL Intracatheter Q12H  . vancomycin  750 mg Intravenous Q24H   Continuous Infusions: . dextrose 5 % and 0.45% NaCl 50 mL/hr at 03/23/14 1030   PRN Meds:.diclofenac sodium, diphenhydrAMINE, hydrALAZINE, naLOXone (NARCAN)  injection, ondansetron (ZOFRAN) IV, sodium chloride, sodium chloride  BP 110/52  Pulse 120  Temp(Src) 97.9 F (36.6 C) (Oral)  Resp 16  Ht 5' 1.81" (1.57 m)  Wt 73.664 kg (162 lb 6.4 oz)  BMI 29.89 kg/m2  SpO2 98%   PPS: 30 % at best  Pain Score: denies presetnly    Intake/Output Summary (Last 24 hours) at 03/23/14 1701 Last data filed at 03/23/14 1545  Gross per 24 hour  Intake 1383.75 ml  Output   1300 ml  Net  83.75 ml       Physical Exam:  General: chronically ill appearing, NAD, weaker today  HEENT: Moist buccal membranes, no exudate  Chest: Decreased in bases CTA  CVS: tachycardic  Abdomen:soft NT +BS  Skin: noted sacral decubitus per  EMR, pale and warm Ext: without edema  Neuro: moves all four extremities, equal strength bilaterally   Labs: CBC    Component Value Date/Time   WBC 8.7 03/23/2014 0520   RBC 2.37* 03/23/2014 0520    RBC 2.06* 03/22/2014 1840   HGB 7.3* 03/23/2014 0520   HCT 22.1* 03/23/2014 0520   PLT 207 03/23/2014 0520   MCV 93.2 03/23/2014 0520   MCH 30.8 03/23/2014 0520   MCHC 33.0 03/23/2014 0520   RDW 14.4 03/23/2014 0520   LYMPHSABS 0.9 03/20/2014 0611   MONOABS 0.7 03/20/2014 0611   EOSABS 0.0 03/20/2014 0611   BASOSABS 0.0 03/20/2014 0611    BMET    Component Value Date/Time   NA 139 03/23/2014 0520   K 4.3 03/23/2014 0520   CL 104 03/23/2014 0520   CO2 24 03/23/2014 0520   GLUCOSE 115* 03/23/2014 0520   BUN 50* 03/23/2014 0520   CREATININE 1.10 03/23/2014 0520   CALCIUM 8.4 03/23/2014 0520   GFRNONAA 46* 03/23/2014 0520   GFRAA 53* 03/23/2014 0520    CMP     Component Value Date/Time   NA 139 03/23/2014 0520   K 4.3 03/23/2014 0520   CL 104 03/23/2014 0520   CO2 24 03/23/2014 0520   GLUCOSE 115* 03/23/2014 0520   BUN 50* 03/23/2014 0520   CREATININE 1.10 03/23/2014 0520   CALCIUM 8.4 03/23/2014 0520   PROT 7.0 03/20/2014 0611   ALBUMIN 3.1* 03/20/2014 0611   AST 29 03/20/2014 0611   ALT 19  03/20/2014 0611   ALKPHOS 119* 03/20/2014 0611   BILITOT 0.4 03/20/2014 0611   GFRNONAA 46* 03/23/2014 0520   GFRAA 53* 03/23/2014 0520     Assessment and Plan: 1. Code Status:DNR/DNI 2. Symptom Control:         Pain: MS Contin 30 mg Po every 12 hrs                   Morphine 1 mg IV every 3 hrs prn          Weakness: PT as tolerated   3. Psycho/Social:  Emotional support offered to family at bedsdie 4. Disposition:  Dependant on outcomes.  Dependant on treatment plan family will be faced with decision of home with home health and continued antibiotics, or home with hospice for full comfort path.    Daughter [name] is very proactive and considering all her options at this time.   Time In Time Out Total Time Spent with Patient Total Overall Time  1100 1200 60 min 60 min    Greater than 50%  of this time was spent counseling and coordinating care related to the above assessment and plan.  Lorinda CreedMary Larach NP  Palliative Medicine Team Team  Phone # [phone] Pager [phone]   1

## 2014-03-23 NOTE — Progress Notes (Signed)
Physical Therapy Discharge Patient Details Name: Toni PartridgeMayfie A Diaz MRN: [id number] DOB: [birth year] Today's Date: 03/23/2014 Time:  -     Patient discharged from PT services secondary to pt on bedrest for unstable Lumbar fracture.  Per Dr. Thedore MinsSingh, will sign off at this time..   03/23/2014  [name] BingKen [name], PT [phone]  (pager)         [name], [name] 03/23/2014, 3:57 PM

## 2014-03-24 DIAGNOSIS — G8929 Other chronic pain: Secondary | ICD-10-CM

## 2014-03-24 DIAGNOSIS — M549 Dorsalgia, unspecified: Secondary | ICD-10-CM

## 2014-03-24 LAB — CBC
HCT: 23.4 % — ABNORMAL LOW (ref 36.0–46.0)
Hemoglobin: 7.8 g/dL — ABNORMAL LOW (ref 12.0–15.0)
MCH: 31.3 pg (ref 26.0–34.0)
MCHC: 33.3 g/dL (ref 30.0–36.0)
MCV: 94 fL (ref 78.0–100.0)
PLATELETS: 205 10*3/uL (ref 150–400)
RBC: 2.49 MIL/uL — AB (ref 3.87–5.11)
RDW: 14.2 % (ref 11.5–15.5)
WBC: 7.2 10*3/uL (ref 4.0–10.5)

## 2014-03-24 LAB — BASIC METABOLIC PANEL
ANION GAP: 10 (ref 5–15)
BUN: 42 mg/dL — ABNORMAL HIGH (ref 6–23)
CHLORIDE: 104 meq/L (ref 96–112)
CO2: 26 meq/L (ref 19–32)
Calcium: 8.7 mg/dL (ref 8.4–10.5)
Creatinine, Ser: 0.95 mg/dL (ref 0.50–1.10)
GFR calc Af Amer: 64 mL/min — ABNORMAL LOW (ref >90)
GFR calc non Af Amer: 55 mL/min — ABNORMAL LOW (ref >90)
Glucose, Bld: 126 mg/dL — ABNORMAL HIGH (ref 70–99)
Potassium: 4.1 mEq/L (ref 3.7–5.3)
SODIUM: 140 meq/L (ref 137–147)

## 2014-03-24 LAB — TYPE AND SCREEN
ABO/RH(D): AB POS
Antibody Screen: NEGATIVE
UNIT DIVISION: 0
UNIT DIVISION: 0
Unit division: 0

## 2014-03-24 LAB — IRON AND TIBC
Iron: 46 ug/dL (ref 42–135)
Saturation Ratios: 21 % (ref 20–55)
TIBC: 216 ug/dL — ABNORMAL LOW (ref 250–470)
UIBC: 170 ug/dL (ref 125–400)

## 2014-03-24 LAB — GLUCOSE, CAPILLARY
GLUCOSE-CAPILLARY: 131 mg/dL — AB (ref 70–99)
GLUCOSE-CAPILLARY: 152 mg/dL — AB (ref 70–99)
Glucose-Capillary: 148 mg/dL — ABNORMAL HIGH (ref 70–99)
Glucose-Capillary: 117 mg/dL — ABNORMAL HIGH (ref 70–99)
Glucose-Capillary: 128 mg/dL — ABNORMAL HIGH (ref 70–99)

## 2014-03-24 LAB — VITAMIN B12: VITAMIN B 12: 878 pg/mL (ref 211–911)

## 2014-03-24 LAB — FOLATE: Folate: >20 ng/mL

## 2014-03-24 LAB — FERRITIN: Ferritin: 197 ng/mL (ref 10–291)

## 2014-03-24 MED ORDER — ALPRAZOLAM 0.25 MG PO TABS: 0.2500 mg | ORAL_TABLET | Freq: Every evening | ORAL | Status: DC | PRN

## 2014-03-24 MED ORDER — ALPRAZOLAM 0.25 MG PO TABS
0.2500 mg | ORAL_TABLET | ORAL | Status: DC | PRN
  Administered 2014-03-24 – 2014-03-26 (×3): 0.25 mg via ORAL
  Filled 2014-03-24 (×3): qty 1

## 2014-03-24 NOTE — Progress Notes (Signed)
[facility]  Subjective: Daughters at bedside, [name] and Elease Hashimotoatricia. They have many questions about discharge planning. We discussed home with hospice vs SNF placement. She is unable to participate in rehab due to unstable lumbar fractures. I told them that she would also need 2 people for in the home so that she may be logrolled for turning/cleaning due to fractures. They have one CNA during the day already and would like to learn more about hospice vs SNF to make a decision. I have asked hospice of choice ([facility]) and LCSW to speak with them further about these options. They at this time feel that they will be able to provide private CNAs at home with the help of hospice and that she will get as good or better care at home than in SNF. Ms. Angeletti prefers to go home but is open and willing to go to SNF if this is a better option to provide her the care she needs. MOST form was discussed/explained yesterday but they are not prepared to fill this out today. We will follow up Monday.    Objective: Allergies  Allergen Reactions  . Penicillins Hives, Shortness Of Breath and Nausea Only    Throat closed up and had hives and nausea. Tolerates carbapenems.   Scheduled Meds: . feeding supplement (ENSURE)  1 Container Oral BID BM  . lactose free nutrition  237 mL Oral Q24H  . meropenem (MERREM) IV  500 mg Intravenous Q12H  . metoprolol  2.5 mg Intravenous 4 times per day  . morphine  30 mg Oral Q12H  . pantoprazole (PROTONIX) IV  40 mg Intravenous Q12H  . sodium chloride  10-40 mL Intracatheter Q12H  . vancomycin  750 mg Intravenous Q24H   Continuous Infusions: . dextrose 5 % and 0.45% NaCl 50 mL/hr at 03/24/14 0049   PRN Meds:.ALPRAZolam, diclofenac sodium, diphenhydrAMINE, hydrALAZINE, morphine injection, naLOXone (NARCAN)  injection, ondansetron (ZOFRAN) IV, sodium chloride, sodium chloride  BP 125/55  Pulse 104  Temp(Src) 98.2 F (36.8 C)  (Oral)  Resp 17  Ht 5' 1.81" (1.57 m)  Wt 73.664 kg (162 lb 6.4 oz)  BMI 29.89 kg/m2  SpO2 100%   PPS: 30%     Intake/Output Summary (Last 24 hours) at 03/24/14 1511 Last data filed at 03/24/14 1457  Gross per 24 hour  Intake   2585 ml  Output   2200 ml  Net    385 ml       Physical Exam:  General: NAD, sleepy, pleasant, chronically ill appearing HEENT:  [place]/AT, no JVD, moist mucous membranes without exudate Chest: CTA throughout, diminished bibasilar, no labored breathing, symmetric CVS: RRR Abdomen: Soft, NT, ND, +BS Ext: MAE, no edema, warm to touch Neuro: Alert, awake, oriented x 3, follows commands  Labs: CBC    Component Value Date/Time   WBC 7.2 03/24/2014 0437   RBC 2.49* 03/24/2014 0437   RBC 2.80* 03/23/2014 1650   HGB 7.8* 03/24/2014 0437   HCT 23.4* 03/24/2014 0437   PLT 205 03/24/2014 0437   MCV 94.0 03/24/2014 0437   MCH 31.3 03/24/2014 0437   MCHC 33.3 03/24/2014 0437   RDW 14.2 03/24/2014 0437   LYMPHSABS 0.9 03/20/2014 0611   MONOABS 0.7 03/20/2014 0611   EOSABS 0.0 03/20/2014 0611   BASOSABS 0.0 03/20/2014 0611    BMET    Component Value Date/Time   NA 140 03/24/2014 0437   K 4.1 03/24/2014 0437   CL  104 03/24/2014 0437   CO2 26 03/24/2014 0437   GLUCOSE 126* 03/24/2014 0437   BUN 42* 03/24/2014 0437   CREATININE 0.95 03/24/2014 0437   CALCIUM 8.7 03/24/2014 0437   GFRNONAA 55* 03/24/2014 0437   GFRAA 64* 03/24/2014 0437    CMP     Component Value Date/Time   NA 140 03/24/2014 0437   K 4.1 03/24/2014 0437   CL 104 03/24/2014 0437   CO2 26 03/24/2014 0437   GLUCOSE 126* 03/24/2014 0437   BUN 42* 03/24/2014 0437   CREATININE 0.95 03/24/2014 0437   CALCIUM 8.7 03/24/2014 0437   PROT 7.0 03/20/2014 0611   ALBUMIN 3.1* 03/20/2014 0611   AST 29 03/20/2014 0611   ALT 19 03/20/2014 0611   ALKPHOS 119* 03/20/2014 0611   BILITOT 0.4 03/20/2014 0611   GFRNONAA 55* 03/24/2014 0437   GFRAA 64* 03/24/2014 0437   Assessment and Plan: 1. Code Status: DNR 2. Symptom  Control: 1. Weakness: Continue medical management.  2. Anxiety: Alprazolam 0.25 mg every 4 hours prn.  3. Itching: Benadryl prn. 4. Pain: MS Contin 30 mg every 12 hours. Morphine 1 mg IV every 3 hours. 3. Psycho/Social: Emotional sup04.5orNorthwest Ambulatory Surger04.5 SSelect Rehabilitation HospitGideon.LaSt. JoFront Range Orthopedic Surgery Flaming GoMeadow Wood Behavioral Health SysMardeAdUlyses BarLuis04.5 HHaven Behavioral HospitGideon.LaMount Sina04.5 BPeconic BGideon.LaFair Park STh04.5 VEncompass Health RehabilitationGideon.LaMental H04.5alThe Eye SGideon.LaBeltway Surgery Centers Dba04.5SaGastrointestinal EndoscoGideon.LaMethodist Surgery Center Bayside CommunitUni04.5erKaiser Fnd Hosp - OrangeGideon.LaSt. Elizabeth CommuNational Surgical Centers Of AChilhoRe04.5ioHi-DeseGideon.LaPrince William Ambulatory SOrthopedic04.5SpNorthern Light Blue Hill Gideon.LaLone St04.5r [place] Gideo04.5.LSouthern California HospGideon.LaMemorial Hermann Orthopedic And SWilm04.5ngSierra Vista RegionGideon.LaMidmichigan EndoscopExecutive Woods Ambu04.5atJames A Haley VGideon.LaSaint Michaels 04.5MiFra04.5klGrand River EndGideon.LaPalmetto GenAllendale CountLa RiviAshtabula County Medical Cen04.5arAscension StGideon.LaChristus Santa Rosa Outpatient Surgery NewSelect Specialt04.5 HEmerald Coast Gideon.LaSelect Speciality Hospital Mason GeneraMineral 04.5eVIrwiG04.5dePeacGideon.LaDunca004.5.5St AloisiGideon.L04.5OvOakbend Medical CentGideon.LaOregon State HospSt AnthonLancas04.5krSaint ALPhonsus MedicGideon.LaFair Park SFishermen'MetamKaiser Fnd Hosp - RiversMardeAdUlyses BarLuisa [phone]277 GreNew England Eye Surgical Center [id number] Griffin Streetra Canterdane Landtes MardeAdUlyses BarLuisa Ha361-Earlene Plater25182H  e assessment and plan.  Harini Dearmond, NP Palliative Medicine Team Pager # [phone] (M-F 8a-5p) Team Phone # [phone] (Nights/Weekends)   1

## 2014-03-24 NOTE — Progress Notes (Signed)
Orthopedic Tech Progress Note Patient Details:  [name] 07/04/1931 [id number] Spoke with nurse; patient already has the TLSO brace but is not cooperating with wearing it as needed. Nursing along with family will try to convince patient to wear brace more regularly.  Patient ID: [name], female   DOB: 07/04/1931, 80 y.o.   MRN: [id number]   Orie Routsia R Thompson 03/24/2014, 12:17 PM

## 2014-03-24 NOTE — Clinical Social Work Placement (Signed)
Clinical Social Work Department CLINICAL SOCIAL WORK PLACEMENT NOTE 03/24/2014  Patient:  [name],[name]  Account Number:  [id number] Admit date:  03/19/2014  Clinical Social Worker:  Mosie EpsteinEMILY S Tayra Dawe, LCSWA  Date/time:  03/24/2014 03:39 PM  Clinical Social Work is seeking post-discharge placement for this patient at the following level of care:   SKILLED NURSING   (*CSW will update this form in Epic as items are completed)   03/24/2014  Patient/family provided with [facility] of Clinical Social Work's list of facilities offering this level of care within the geographic area requested by the patient (or if unable, by the patient's family).  03/24/2014  Patient/family informed of their freedom to choose among providers that offer the needed level of care, that participate in Medicare, Medicaid or managed care program needed by the patient, have an available bed and are willing to accept the patient.  03/24/2014  Patient/family informed of [facility]' ownership interest in [facility], as well as of the fact that they are under no obligation to receive care at this facility.  PASARR submitted to EDS on  PASARR number received on   FL2 transmitted to all facilities in geographic area requested by pt/family on  03/24/2014 FL2 transmitted to all facilities within larger geographic area on   Patient informed that his/her managed care company has contracts with or will negotiate with  certain facilities, including the following:     Patient/family informed of bed offers received:   Patient chooses bed at  Physician recommends and patient chooses bed at    Patient to be transferred to  on   Patient to be transferred to facility by  Patient and family notified of transfer on  Name of family member notified:    The following physician request were entered in Epic:   Additional Comments: PASARR previously existing.  [name], MSW, [facility] Licensed Clinical  Social Worker [phone] and [phone] [phone]

## 2014-03-24 NOTE — Progress Notes (Signed)
Patient Demographics  [name], is a 80 y.o. female, DOB - 03/30/1931, ZOX:[id number]  Admit date - 03/19/2014   Admitting Physician Eduard Clos, MD  Outpatient Primary MD for the patient is [name], MD  LOS - 5  Pleasant 80 year old female admitted to the hospital a few days ago with low back pain, extensive workup it looks like the patient has had L-spine fracture with surrounding hematoma and blood, case has been discussed with vascular surgery and neurosurgery, palliative care is on board. Currently plan is gentle medical treatment with possible placement to a nursing home, she is DO NOT RESUSCITATE, no surgical or heroic procedures. If further declines full hospice.    Chief Complaint  Patient presents with  . Back Pain       Subjective:   [name] today has, No headache, No chest pain, No abdominal pain - No Nausea, No new weakness tingling or numbness, No Cough - SOB. ++ low back pain, but somewhat better in terms of pain.   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 Surgery following. No need for surgery. Wound care following.   MRI of the back was then ordered yesterday evening which showed changes suggestive of an L-spine fluid therapy at L4 level also there was a suspicion of a aortic - Vertebral fistula. A CT angiogram of the abdomen and pelvis was also done which was inconclusive. I discussed her status with Dr. Hart Rochester vascular surgeon in detail, he has ruled out retroperitoneal bleed or any aortic - Vertebral  communication.    I have discussed her case with Dr. Franky Macho in Dr. Danielle Dess neurosurgeon, who suggested patient is not an operative candidate which I concur with, they have reviewed the scans, Dr. Danielle Dess recommends general medical treatment of decline comfort care. He is of the opinion that patient likely is up at L-spine fracture with blood in the spine and around the spinal process. To rule out possibility of infection she underwent aspiration of L-spine fluid by IR on 03/23/2014. For now continue vancomycin and meropenem.   After detailed discussion with patient, her daughter [name], palliative care team it is decided that patient will be given a trial of medical treatment with eventual oral antibiotics for few days, apply TLSO brace with activity, look for placement to a nursing home, goal of care will be general medical treatment directed towards comfort. If she continues to decline. Space. She is DO NOT RESUSCITATE.     2. History of hypertension and chronic combined systolic and diastolic CHF EF 81%. Currently compensated from CHF standpoint no acute, continue beta blocker fluid and salt restriction.      3. Anemia of chronic disease. Likely for dilution from IV fluids, reviewed her CT MRI in detail with vascular surgery, no signs of the retroperitoneal bleed, ending Hemoccult blood , stable anemia panel, she received 2 units of packed RBCs by 03/23/2012. We'll monitor H&H closely. Continue PPI. Continue to monitor clinically and H&H.     4.  ARF - likely due to prerenal azotemia, also some injury from CT IV contrast which was needed to rule out aortic dissection. Resolved after hydration.    5. Stage IV chronic stage IV sacro- coccygeal Decub Ulcer . Wound care following. Seen by surgery. Not a surgical need at this time.    6. 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       Code Status: DNR D/W daughter  Family  Communication: Daughter on a daily basis  Disposition Plan: Residential hospice versus SNF with comfort care and transition to hospice if declines   Procedures CT scan abdomen pelvis, IR  CT-guided L-spine aspiration, right IJ central line placement by PCCM   Consults [facility], palliative care, Vas Surgey, N surgery Dr Franky Machoabbell and Dr Danielle DessElsner over the phone on 03-22-14, PC CM for right IJ central line placement, IR   Medications  Scheduled Meds: . feeding supplement (ENSURE)  1 Container Oral BID BM  . lactose free nutrition  237 mL Oral Q24H  . meropenem (MERREM) IV  500 mg Intravenous Q12H  . metoprolol  2.5 mg Intravenous 4 times per day  . morphine  30 mg Oral Q12H  . pantoprazole (PROTONIX) IV  40 mg Intravenous Q12H  . sodium chloride  10-40 mL Intracatheter Q12H  . vancomycin  750 mg Intravenous Q24H   Continuous Infusions: . dextrose 5 % and 0.45% NaCl 50 mL/hr at 03/24/14 0049   PRN Meds:.diclofenac sodium, diphenhydrAMINE, hydrALAZINE, morphine injection, naLOXone (NARCAN)  injection, ondansetron (ZOFRAN) IV, sodium chloride, sodium chloride  DVT Prophylaxis    SCD  Lab Results  Component Value Date   PLT 205 03/24/2014    Antibiotics     Anti-infectives   Start     Dose/Rate Route Frequency Ordered Stop   03/22/14 0200  meropenem (MERREM) 500 mg in sodium chloride 0.9 % 50 mL IVPB     500 mg 100 mL/hr over 30 Minutes Intravenous Every 12 hours 03/21/14 1701     03/22/14 0100  vancomycin (VANCOCIN) IVPB 750 mg/150 ml premix     750 mg 150 mL/hr over 60 Minutes Intravenous Every 24 hours 03/21/14 1701     03/20/14 2200  ertapenem (INVANZ) 1 g in sodium chloride 0.9 % 50 mL IVPB  Status:  Discontinued     1 g 100 mL/hr over 30 Minutes Intravenous Every 24 hours 03/20/14 0255 03/21/14 1043   03/20/14 2200  vancomycin (VANCOCIN) IVPB 1000 mg/200 mL premix  Status:  Discontinued     1,000 mg 200 mL/hr over 60 Minutes Intravenous Every 24 hours 03/20/14 0321 03/21/14 1043     03/19/14 2300  ertapenem (INVANZ) 1 g in sodium chloride 0.9 % 50 mL IVPB     1 g 100 mL/hr over 30 Minutes Intravenous  Once 03/19/14 2248 03/20/14 0106   03/19/14 2245  vancomycin (VANCOCIN) IVPB 1000 mg/200 mL premix     1,000 mg 200 mL/hr over 60 Minutes Intravenous  Once 03/19/14 2232 03/20/14 0014          Objective:   Filed Vitals:   03/23/14 1512 03/23/14 2000 03/23/14 2208 03/24/14 0520  BP: 110/52  122/69 118/66  Pulse: 120  103 111  Temp: 97.9 F (36.6 C)  97.9 F (36.6 C) 98.6 F (37 C)  TempSrc: Oral  Oral Oral  Resp: 16 14 18 17   Height:      Weight:      SpO2: 98%  100%  98%    Wt Readings from Last 3 Encounters:  03/20/14 73.664 kg (162 lb 6.4 oz)  03/13/14 75.751 kg (167 lb)  11/18/13 73.936 kg (163 lb)     Intake/Output Summary (Last 24 hours) at 03/24/14 1055 Last data filed at 03/24/14 0917  Gross per 24 hour  Intake 3307.5 ml  Output   1700 ml  Net 1607.5 ml     Physical Exam  Awake Alert, Oriented X 2, No new F.N deficits, Normal affect [place].AT,PERRAL Supple Neck,No JVD, No cervical lymphadenopathy appriciated.  Symmetrical Chest wall movement, Good air movement bilaterally, CTAB RRR,No Gallops,Rubs or new Murmurs, No Parasternal Heave +ve B.Sounds, Abd Soft, No tenderness, No organomegaly appriciated, No rebound - guarding or rigidity. No Cyanosis, Clubbing or edema, No new Rash or bruise      Data Review   Micro Results Recent Results (from the past 240 hour(s))  CULTURE, BLOOD (ROUTINE X 2)     Status: None   Collection Time    03/22/14 10:00 AM      Result Value Ref Range Status   Specimen Description BLOOD LEFT ANTECUBITAL   Final   Special Requests BOTTLES DRAWN AEROBIC ONLY 4CC   Final   Culture  Setup Time     Final   Value: 03/22/2014 16:16     Performed at [facility]   Culture     Final   Value:        BLOOD CULTURE RECEIVED NO GROWTH TO DATE CULTURE WILL BE HELD FOR 5 DAYS BEFORE ISSUING A FINAL  NEGATIVE REPORT     Performed at [facility]   Report Status PENDING   Incomplete  URINE CULTURE     Status: None   Collection Time    03/22/14  3:40 PM      Result Value Ref Range Status   Specimen Description URINE, CATHETERIZED   Final   Special Requests NONE   Final   Culture  Setup Time     Final   Value: 03/22/2014 19:23     Performed at [facility] Count     Final   Value: NO GROWTH     Performed at [facility]   Culture     Final   Value: NO GROWTH     Performed at [facility]   Report Status 03/23/2014 FINAL   Final  CULTURE, BLOOD (ROUTINE X 2)     Status: None   Collection Time    03/22/14  6:40 PM      Result Value Ref Range Status   Specimen Description BLOOD CENTRAL LINE   Final   Special Requests BOTTLES DRAWN AEROBIC AND ANAEROBIC 5CC   Final   Culture  Setup Time     Final   Value: 03/23/2014 00:43     Performed at [facility]   Culture     Final   Value:        BLOOD CULTURE RECEIVED NO GROWTH TO DATE CULTURE WILL BE HELD FOR 5 DAYS BEFORE ISSUING A FINAL NEGATIVE REPORT     Performed at [facility]   Report Status PENDING   Incomplete  WOUND CULTURE     Status: None   Collection Time    03/23/14  2:45 PM      Result Value Ref Range Status   Specimen Description WOUND ASPIRATE ABDOMEN   Final   Special Requests LEFT RETROPERITONEAL HEMATOMA  Final   Gram Stain     Final   Value: NO WBC SEEN     NO SQUAMOUS EPITHELIAL CELLS SEEN     NO ORGANISMS SEEN     Performed at [facility]   Culture     Final   Value: NO GROWTH 1 DAY     Performed at [facility]   Report Status PENDING   Incomplete    Radiology Reports   Ct Abdomen Pelvis W Contrast  03/19/2014   CLINICAL DATA:  Abdominal pain, low back pain, nausea.  EXAM: CT ABDOMEN AND PELVIS WITH CONTRAST  TECHNIQUE: Multidetector CT imaging of the abdomen and pelvis was performed using the standard protocol following bolus  administration of intravenous contrast.  CONTRAST:  OMNIPAQUE IOHEXOL 300 MG/ML  SOLN  COMPARISON:  None.  FINDINGS: Dependent atelectasis in the lung bases. Densely calcified coronary arteries. No effusions. Heart is upper limits normal in size. Oral contrast material is seen within the esophagus which may be related to dysmotility or reflux.  Prior cholecystectomy. Common bile duct is dilated, likely related to post cholecystectomy state. No focal abnormality within the liver, spleen, pancreas, adrenals. Areas of atrophy and scarring in the posterior upper and mid poles of the right kidney. Given the heavily calcified aorta and renal arteries, this is likely from chronic renovascular disease. Small cyst in the midpole of the left kidney. No hydronephrosis.  Scattered colonic diverticulosis. Large stool burden throughout the colon. Small bowel is decompressed. Stomach is unremarkable.  Gas noted within the endometrial canal of the uterus. The sigmoid colon is contiguous with the fundus of the uterus and there are several diverticula in the region, but no inflammatory process or visible fistula, but fistula is a concern if the patient has not had recent instrumentation.  Gas noted within the soft tissues extending from the skin surface in the midline of the buttocks deep to near the coccyx compatible with large decubitus ulcer.  There is also a gas collection noted to the left of the aortic bifurcation between the aortic bifurcation and the left psoas muscle in the left retroperitoneum (image 47). This is adjacent to a severely degenerated L4-5 disc with vacuum disc noted, but no definite connection to the disc space. This conceivably could represent a large herniated disc fragment, but other cause of gas cannot be excluded. Again, no significant inflammation surrounding this area.  IMPRESSION: Gas within the scratch head gas fills the endometrial canal of the uterus. The sigmoid colon is a medially contiguous  to the uterus with diverticulosis, but no inflammatory process noted at this time. 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 Given the lack of inflammation, this may be a chronic longstanding process.  Large sacral decubitus ulcer. Gas extends down to the coccyx. No bony changes to suggest osteomyelitis.  Small gas collection in the left retroperitoneum near the aortic bifurcation of unknown etiology. Question large herniated disc fragment from the adjacent degenerated disc. Again, no inflammation in this area to suggest active inflammatory process or to suggest this is an abscess.  These results were called by telephone at the time of interpretation on 03/19/2014 at 9:55 PM to Dr. Pricilla Loveless , who verbally acknowledged these results.   Electronically Signed   By: Charlett Nose M.D.   On: 03/19/2014 21:55    CBC  Recent Labs Lab 03/19/14 1935 03/20/14 0611 03/21/14 0701 03/22/14 0010 03/22/14 1840 03/22/14  2330 03/23/14 0520 03/23/14 2127 03/24/14 0437  WBC 12.1* 10.8* 16.1* 13.9*  --   --  8.7  --  7.2  HGB 10.7* 10.7* 10.1* 7.1* 6.5* 7.6* 7.3* 8.5* 7.8*  HCT 31.9* 32.9* 30.7* 21.7* 19.4* 22.6* 22.1* 25.3* 23.4*  PLT 233 218 PLATELET CLUMPS NOTED ON SMEAR, COUNT APPEARS ADEQUATE 189  --   --  207  --  205  MCV 93.3 95.1 95.9 95.6  --   --  93.2  --  94.0  MCH 31.3 30.9 31.6 31.3  --   --  30.8  --  31.3  MCHC 33.5 32.5 32.9 32.7  --   --  33.0  --  33.3  RDW 13.6 13.8 13.8 13.9  --   --  14.4  --  14.2  LYMPHSABS 1.5 0.9  --   --   --   --   --   --   --   MONOABS 0.7 0.7  --   --   --   --   --   --   --   EOSABS 0.1 0.0  --   --   --   --   --   --   --   BASOSABS 0.0 0.0  --   --   --   --   --   --   --     Chemistries   Recent Labs Lab 03/19/14 1935 03/20/14 0611 03/21/14 0701 03/22/14 0010 03/23/14 0520 03/24/14 0437  NA 138 137 138 136* 139 140  K 4.6 4.2 4.6 4.4 4.3 4.1  CL 99 98 98 99 104 104  CO2 24  26 25 24 24 26   GLUCOSE 103* 113* 102* 117* 115* 126*  BUN 26* 24* 41* 56* 50* 42*  CREATININE 1.05 0.83 1.49* 1.79* 1.10 0.95  CALCIUM 10.0 9.4 9.1 8.6 8.4 8.7  AST 34 29  --   --   --   --   ALT 21 19  --   --   --   --   ALKPHOS 127* 119*  --   --   --   --   BILITOT 0.4 0.4  --   --   --   --    ------------------------------------------------------------------------------------------------------------------ estimated creatinine clearance is 44.2 ml/min (by C-G formula based on Cr of 0.95). ------------------------------------------------------------------------------------------------------------------ No results found for this basename: HGBA1C,  in the last 72 hours ------------------------------------------------------------------------------------------------------------------ No results found for this basename: CHOL, HDL, LDLCALC, TRIG, CHOLHDL, LDLDIRECT,  in the last 72 hours ------------------------------------------------------------------------------------------------------------------ No results found for this basename: TSH, T4TOTAL, FREET3, T3FREE, THYROIDAB,  in the last 72 hours ------------------------------------------------------------------------------------------------------------------  Recent Labs  03/22/14 1840 03/23/14 0500 03/23/14 1650  VITAMINB12  --  999* 878  FOLATE  --  >20.0 >20.0  FERRITIN  --  190 197  TIBC  --  210* 216*  IRON  --  76 46  RETICCTPCT 2.4  --  2.2    Coagulation profile  Recent Labs Lab 03/22/14 1840  INR 1.13    No results found for this basename: DDIMER,  in the last 72 hours  Cardiac Enzymes No results found for this basename: CK, CKMB, TROPONINI, MYOGLOBIN,  in the last 168 hours ------------------------------------------------------------------------------------------------------------------ No components found with this basename: POCBNP,      Time Spent in minutes 35   [name] K M.D on 03/24/2014 at  10:55 AM  Between 7am to 7pm - Pager - [phone]  After 7pm go  to [email or web address] - password [email or web address]  And look for the night coverage person covering for me after hours  [facility] Office  [phone]   **Disclaimer: This note may have been dictated with voice recognition software. Similar sounding words can inadvertently be transcribed and this note may contain transcription errors which may not have been corrected upon publication of note.**

## 2014-03-24 NOTE — Care Management Note (Signed)
  Page 1 of 1   03/27/2014     11:31:35 AM CARE MANAGEMENT NOTE 03/27/2014  Patient:  [name],[name]   Account Number:  [id number]  Date Initiated:  03/24/2014  Documentation initiated by:  [name],[name]  Subjective/Objective Assessment:     Action/Plan:   Anticipated DC Date:  03/27/2014   Anticipated DC Plan:  HOME W HOSPICE CARE         Choice offered to / List presented to:  C-4 Adult Children           HH agency  [facility]   Status of service:   Medicare Important Message given?   (If response is "NO", the following Medicare IM given date fields will be blank) Date Medicare IM given:   Medicare IM given by:   Date Additional Medicare IM given:   Additional Medicare IM given by:    Discharge Disposition:    Per UR Regulation:    If discussed at Long Length of Stay Meetings, dates discussed:    Comments:  03-27-14 [name] has decided to take her mother home with hospice . Toni BraunKaren [facility] aware and will talk with [name] . Patient needs hospital bed , oxygen for home . SW aware ambulance transportation home. Toni FlurryHeather Shaneya Taketa RN BSN [phone]   03-24-14 Spoke with patient daughter [name] at bedside . [name] is considering taking her mother home with hospice . [name] would like to talk to [facility] directly to discuss their services . Choice offered , [facility] . Toni BraunKaren [facility]  [facility] , will call [name] directly to answer questions .  Toni FlurryHeather Kaelei Wheeler RN BSN

## 2014-03-24 NOTE — Clinical Social Work Note (Signed)
CSW has faxed pt's clinical information [facility] for possible [facility]. Pt's family understanding pt may not receive SNF authorization due to pt's inability to participate in therapies. Pt's family stated they are "hopeful" pt is approved, but understand the possibility of denial. CSW to continue to follow and assist with discharge planning needs.  Marcelline DeistEmily Jalayne Ganesh, MSW, [facility] Licensed Clinical Social Worker [phone] and [phone] [phone]

## 2014-03-24 NOTE — Progress Notes (Addendum)
Notified by [facility] that patient's daughter Vickii Chafe requested [facility]osp[facility] and [facility] Hospital Liaison to meet with them and answer questions to help in the decision regarding discharge disposition for her mother. Met with patient's daughters Vickii Chafe and Mardene Celeste who had questions about what services were provided by [facility]. Services explained, as well as team approach, philosophy of care and focus on comfort with good understanding voiced. Vickii Chafe is most concerned about her ability to arrange appropriate 24 hr care for her mother. She expressed her fear regarding what she had been told by Md's that her mother could be "paralized with a turn in the bed". This is in relation to her L spine fracture.  Patient is currently on a rotating bed in the hospital. She has a foley in place d/t her stage 4 sacral wound and to limit turning for personal care. Vickii Chafe is very concerned about making the right decision between a skilled nursing facility and taking her mother home with [facility] services. Much emotional support offered. Vickii Chafe is very clear that she wants her mother comfortable, she just wants to be sure it is in the "right place". [name] also expressed concern regarding her mother's anxiety level and feels that she would benefit from antianxiety medication. Patient takes alprazolam 0.54m at home. Current orders are for bedtime only. Discussed with AElmo PuttPMT NP who will address..Vickii Chafeagreed to have writer follow up with her on Monday. Contact information given. KFlo ShanksRN, [facility] [phone]

## 2014-03-24 NOTE — Clinical Social Work Psychosocial (Signed)
Clinical Social Work Department BRIEF PSYCHOSOCIAL ASSESSMENT 03/24/2014  Patient:  [name], [name]     Account Number:  [id number]     Admit date:  03/19/2014  Clinical Social Worker:  Delrae Sawyers  Date/Time:  03/24/2014 03:33 PM  Referred by:  Physician  Date Referred:  03/24/2014 Referred for  SNF Placement   Other Referral:   versus home with palliative services following. Per palliative, pt not eligible for residential hospice.   Interview type:  Family Other interview type:   CSW met with pt's daugher, [name], at bedside.    PSYCHOSOCIAL DATA Living Status:  FAMILY Admitted from facility:   Level of care:   Primary support name:  [name], [phone] Primary support relationship to patient:  CHILD, ADULT Degree of support available:   Strong support system.    CURRENT CONCERNS Current Concerns  Post-Acute Placement   Other Concerns:   none.    SOCIAL WORK ASSESSMENT / PLAN CSW received consult of possible SNF placement with palliative services following, at time of discharge. CSW consulted with Palliative NP regarding prognosis and discharge planning.    CSW met with pt and pt's daughter at bedside. Pt's daughter stated she is exploring all discharge disposition options. CSW and pt's daughter discussed possiblity of pt's insurance denying SNF placement due to pt inability to participate in therapies. (Pt could possibly be deemed as "custodial care" which is not covered by pt's insurance). Pt's daughter expressed understanding. CSW offerred support.    CSW has faxed appropriate clinical documentation to pt's insurance for SNF authorization. CSW has requested weekend CSW to please follow-up over weekend regarding discharge disposition.   Assessment/plan status:  Psychosocial Support/Ongoing Assessment of Needs Other assessment/ plan:   none   Information/referral to community resources:   [facility] placement. Palliative following. RNCM  following for possibility of discharge disposition home with hospice.    PATIENT'S/FAMILY'S RESPONSE TO PLAN OF CARE: Pt's daughter understanding and agreeable to CSW plan of care. Pt's daughter expressed concern for possibility of denial from pt's insurance. CSW offerred support to pt's daughter. Pt's daughter stated appreciation for CSW and noted CSW has worked with pt and pt's daughter on previous admission.       Lubertha Sayres, MSW, [facility] Licensed Clinical Social Worker [phone] and [phone]

## 2014-03-24 NOTE — Progress Notes (Signed)
Pt hollering out for nurse, states she is in such bad pain, PRN medication given as ordered.

## 2014-03-24 NOTE — Progress Notes (Signed)
ANTIBIOTIC CONSULT NOTE - Follow Up  Pharmacy Consult for vancomycin and merropenem Indication: discitis  Allergies  Allergen Reactions  . Penicillins Hives, Shortness Of Breath and Nausea Only    Throat closed up and had hives and nausea. Tolerates carbapenems.   Patient Measurements: Height: 5' 1.81" (157 cm) Weight: 162 lb 6.4 oz (73.664 kg) IBW/kg (Calculated) : 49.67  Vital Signs: Temp: 98.2 F (36.8 C) (07/10 1207) Temp src: Oral (07/10 1207) BP: 125/55 mmHg (07/10 1207) Pulse Rate: 104 (07/10 1207) Intake/Output from previous day: 07/09 0701 - 07/10 0700 In: 2947.5 [P.O.:360; I.V.:1742.5; Blood:380; IV Piggyback:465] Out: 1700 [Urine:1700] Intake/Output from this shift: Total I/O In: 360 [P.O.:360] Out: -   Labs:  Recent Labs  03/22/14 0010 03/22/14 1540  03/23/14 0520 03/23/14 2127 03/24/14 0437  WBC 13.9*  --   --  8.7  --  7.2  HGB 7.1*  --   < > 7.3* 8.5* 7.8*  PLT 189  --   --  207  --  205  LABCREA  --  97.47  --   --   --   --   CREATININE 1.79*  --   --  1.10  --  0.95  < > = values in this interval not displayed. Estimated Creatinine Clearance: 44.2 ml/min (by C-G formula based on Cr of 0.95).  Microbiology: Recent Results (from the past 720 hour(s))  CULTURE, BLOOD (ROUTINE X 2)     Status: None   Collection Time    03/22/14 10:00 AM      Result Value Ref Range Status   Specimen Description BLOOD LEFT ANTECUBITAL   Final   Special Requests BOTTLES DRAWN AEROBIC ONLY 4CC   Final   Culture  Setup Time     Final   Value: 03/22/2014 16:16     Performed at [facility]   Culture     Final   Value:        BLOOD CULTURE RECEIVED NO GROWTH TO DATE CULTURE WILL BE HELD FOR 5 DAYS BEFORE ISSUING A FINAL NEGATIVE REPORT     Performed at [facility]   Report Status PENDING   Incomplete  URINE CULTURE     Status: None   Collection Time    03/22/14  3:40 PM      Result Value Ref Range Status   Specimen Description URINE,  CATHETERIZED   Final   Special Requests NONE   Final   Culture  Setup Time     Final   Value: 03/22/2014 19:23     Performed at [facility] Count     Final   Value: NO GROWTH     Performed at [facility]   Culture     Final   Value: NO GROWTH     Performed at [facility]   Report Status 03/23/2014 FINAL   Final  CULTURE, BLOOD (ROUTINE X 2)     Status: None   Collection Time    03/22/14  6:40 PM      Result Value Ref Range Status   Specimen Description BLOOD CENTRAL LINE   Final   Special Requests BOTTLES DRAWN AEROBIC AND ANAEROBIC 5CC   Final   Culture  Setup Time     Final   Value: 03/23/2014 00:43     Performed at [facility]   Culture     Final   Value:  BLOOD CULTURE RECEIVED NO GROWTH TO DATE CULTURE WILL BE HELD FOR 5 DAYS BEFORE ISSUING A FINAL NEGATIVE REPORT     [facility]   Report Status PENDING   Incomplete  WOUND CULTURE     Status: None   Collection Time    03/23/14  2:45 PM      Result Value Ref Range Status   Specimen Description WOUND ASPIRATE ABDOMEN   Final   Special Requests LEFT RETROPERITONEAL HEMATOMA   Final   Gram Stain     Final   Value: NO WBC SEEN     NO SQUAMOUS EPITHELIAL CELLS SEEN     NO ORGANISMS SEEN     [facility]   Culture     Final   Value: NO GROWTH 1 DAY     [facility]   Report Status PENDING   Incomplete   Medical History: Past Medical History  Diagnosis Date  . CHF (congestive heart failure)   . Coronary artery disease   . Hypertension   . Arthritis   . Edema of extremities   . Anxiety   . HLD (hyperlipidemia)   . Peripheral vascular disease   . Stroke Jan 2015    left sided weakness, trouble speaking  . Cataracts, bilateral   . Detached retina     right eye  . Decubitus ulcer of sacral area     STAGE 4   Medications:  Prescriptions prior to admission  Medication Sig Dispense Refill  .  ALPRAZolam (XANAX) 0.25 MG tablet Take 0.25 mg by mouth at bedtime as needed for anxiety.      Marland Kitchen. aspirin 325 MG tablet Take 325 mg by mouth daily.      . Calcium Carb-Cholecalciferol (CALCIUM 600 + D PO) Take 1 tablet by mouth daily.      . diclofenac sodium (VOLTAREN) 1 % GEL Apply 2 g topically 2 (two) times daily as needed (pain).      . furosemide (LASIX) 20 MG tablet Take 20 mg by mouth daily.      Marland Kitchen. losartan (COZAAR) 50 MG tablet Take 50 mg by mouth daily.      . Menthol, Topical Analgesic, (BIOFREEZE EX) Apply 1 application topically daily as needed (pain).      . methocarbamol (ROBAXIN) 500 MG tablet Take 500 mg by mouth every 6 (six) hours as needed for muscle spasms.       . metoprolol (LOPRESSOR) 50 MG tablet Take 50 mg by mouth 2 (two) times daily.      . Multiple Vitamin (MULTIVITAMIN WITH MINERALS) TABS Take 1 tablet by mouth daily.      . naproxen sodium (ANAPROX) 220 MG tablet Take 220 mg by mouth 2 (two) times daily as needed (pain).       Marland Kitchen. oxyCODONE-acetaminophen (PERCOCET) 10-325 MG per tablet Take 1 tablet by mouth every 6 (six) hours.      . pramipexole (MIRAPEX) 0.5 MG tablet Take 0.5 mg by mouth at bedtime.       . pravastatin (PRAVACHOL) 80 MG tablet Take 80 mg by mouth every evening.        Assessment: 80 yo female with low back pain secondary to stage IV decub ulcer and possible discitis.  Initially started on vancomycin and ertapenem, now on vancomycin and meropenem.  Yesterday she underwent aspiration of L-spine fluid to assess for infection.  Creatinine has trended back down to normal range and  est. crcl is 22ml/min.  Leukocytosis has resolved. Her cultures have all been negative thus far.  Goal of Therapy:  Vancomycin trough level 15-20 mcg/ml  Plan:  -Continue IV Vancomycin 750 mg IV q24h -Continue IV Meropenem 500 mg IV q12h -F/u duration of antibiotics/goals of care -Monitor renal function -Vanc trough as indicated  Nadara Mustard, PharmD., MS Clinical  Pharmacist Pager:  [phone] Thank you for allowing pharmacy to be part of this patients care team. 03/24/2014 1:35 PM

## 2014-03-25 LAB — CBC
HCT: 24.7 % — ABNORMAL LOW (ref 36.0–46.0)
HEMOGLOBIN: 8.2 g/dL — AB (ref 12.0–15.0)
MCH: 31.3 pg (ref 26.0–34.0)
MCHC: 33.2 g/dL (ref 30.0–36.0)
MCV: 94.3 fL (ref 78.0–100.0)
Platelets: 234 10*3/uL (ref 150–400)
RBC: 2.62 MIL/uL — ABNORMAL LOW (ref 3.87–5.11)
RDW: 14 % (ref 11.5–15.5)
WBC: 8.2 10*3/uL (ref 4.0–10.5)

## 2014-03-25 LAB — BASIC METABOLIC PANEL
Anion gap: 9 (ref 5–15)
BUN: 30 mg/dL — AB (ref 6–23)
CO2: 27 meq/L (ref 19–32)
Calcium: 8.8 mg/dL (ref 8.4–10.5)
Chloride: 104 mEq/L (ref 96–112)
Creatinine, Ser: 0.78 mg/dL (ref 0.50–1.10)
GFR calc Af Amer: 89 mL/min — ABNORMAL LOW (ref >90)
GFR, EST NON AFRICAN AMERICAN: 77 mL/min — AB (ref >90)
Glucose, Bld: 99 mg/dL (ref 70–99)
Potassium: 4.4 mEq/L (ref 3.7–5.3)
Sodium: 140 mEq/L (ref 137–147)

## 2014-03-25 LAB — GLUCOSE, CAPILLARY
GLUCOSE-CAPILLARY: 117 mg/dL — AB (ref 70–99)
GLUCOSE-CAPILLARY: 151 mg/dL — AB (ref 70–99)
Glucose-Capillary: 109 mg/dL — ABNORMAL HIGH (ref 70–99)
Glucose-Capillary: 110 mg/dL — ABNORMAL HIGH (ref 70–99)
Glucose-Capillary: 114 mg/dL — ABNORMAL HIGH (ref 70–99)
Glucose-Capillary: 125 mg/dL — ABNORMAL HIGH (ref 70–99)
Glucose-Capillary: 129 mg/dL — ABNORMAL HIGH (ref 70–99)

## 2014-03-25 MED ORDER — DOXYCYCLINE HYCLATE 100 MG PO TABS
100.0000 mg | ORAL_TABLET | Freq: Two times a day (BID) | ORAL | Status: DC
  Administered 2014-03-25 – 2014-03-26 (×4): 100 mg via ORAL
  Filled 2014-03-25 (×7): qty 1

## 2014-03-25 NOTE — Progress Notes (Signed)
Patient Demographics  [name], is a 80 y.o. female, DOB - 11/19/1930, ZOX:[id number]  Admit date - 03/19/2014   Admitting Physician Toni Clos, MD  Outpatient Primary MD for the patient is Toni Divine, MD  LOS - 6  Pleasant 80 year old female admitted to the hospital a few days ago with low back pain, extensive workup it looks like the patient has had L-spine fracture with surrounding hematoma and blood, case has been discussed with vascular surgery and neurosurgery, palliative care is on board. Currently plan is gentle medical treatment with full focus on comfort care, patient and daughter now want to take her home with home hospice which will be arranged.    Chief Complaint  Patient presents with  . Back Pain       Subjective:   [name] today has, No headache, No chest pain, No abdominal pain - No Nausea, No new weakness tingling or numbness, No Cough - SOB. ++ low back pain, but somewhat better in terms of pain.   Assessment & Plan    1.Low back pain - due to L spine fracture.   MRI showed an L-spine fluid therapy at L4 level also there was a suspicion of a aortic - Vertebral fistula. A CT angiogram of the abdomen and pelvis was also done which was inconclusive. I discussed her status with [name] vascular surgeon in detail, he has ruled out retroperitoneal bleed or any aortic - Vertebral communication.    I have discussed her case with [name] in [name] neurosurgeon, who suggested patient is not an operative candidate which I concur with, they have reviewed the scans, [name] recommends general medical treatment of decline comfort care. He is of the opinion that patient likely is up at L-spine fracture with blood in the spine and around the spinal  process.    To rule out remote possibility of infection she underwent aspiration of L-spine fluid by IR on 03/23/2014. For now we'll place her on oral Doxy only stop vancomycin and meropenem clinically chances of infection are low, monitor aspirate cultures.   After detailed discussion with patient, her daughter [name], palliative care team it is decided that patient will be given a trial of medical treatment due to her discomfort, may continue oral antibiotics for few days, apply TLSO brace with activity, then to take home with home hospice. She is DO NOT RESUSCITATE.     2. History of hypertension and chronic combined systolic and diastolic CHF EF 81%. Currently compensated from CHF standpoint no acute, continue beta blocker fluid and salt restriction.      3. Anemia of chronic disease. Likely for dilution from IV fluids, reviewed her CT MRI in detail with vascular surgery, no signs of the retroperitoneal bleed, stable anemia panel, she received 2 units of packed RBCs by 03/23/2012. Continue PPI. Supportive care only     4. ARF - likely due to prerenal azotemia, also some injury from CT IV contrast which was needed to rule out aortic dissection. Resolved after hydration.     5. Stage IV chronic stage IV sacro- coccygeal Decub Ulcer . Wound care following. Seen by surgery. Not a surgical need at this time.     6. 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     7. Chronic stage IV sacro- coccygeal decubitus ulcer on the back appears chronic, fistula which was noted on CT scan and concerning Colo - uterine fistula also appears to be chronic if present, no stool from vagina reported. Surgery following. No need for surgery. Wound care following.       Code Status: DNR D/W daughter  Family Communication: Daughter on a daily basis  Disposition Plan: Residential hospice versus SNF with comfort care and transition to  hospice if declines   Procedures CT scan abdomen pelvis, IR  CT-guided L-spine aspiration, right IJ central line placement by PCCM   Consults [facility], palliative care, Vas Surgey, N surgery Dr Franky Diaz and Dr Danielle Diaz over the phone on 03-22-14, PC CM for right IJ central line placement, IR   Medications  Scheduled Meds: . doxycycline  100 mg Oral Q12H  . feeding supplement (ENSURE)  1 Container Oral BID BM  . lactose free nutrition  237 mL Oral Q24H  . metoprolol  2.5 mg Intravenous 4 times per day  . morphine  30 mg Oral Q12H  . pantoprazole (PROTONIX) IV  40 mg Intravenous Q12H  . sodium chloride  10-40 mL Intracatheter Q12H   Continuous Infusions: . dextrose 5 % and 0.45% NaCl 50 mL/hr at 03/24/14 1519   PRN Meds:.ALPRAZolam, diclofenac sodium, diphenhydrAMINE, hydrALAZINE, morphine injection, naLOXone (NARCAN)  injection, ondansetron (ZOFRAN) IV, sodium chloride, sodium chloride  DVT Prophylaxis    SCD  Lab Results  Component Value Date   PLT 234 03/25/2014    Antibiotics     Anti-infectives   Start     Dose/Rate Route Frequency Ordered Stop   03/25/14 1130  doxycycline (VIBRA-TABS) tablet 100 mg     100 mg Oral Every 12 hours 03/25/14 1059     03/22/14 0200  meropenem (MERREM) 500 mg in sodium chloride 0.9 % 50 mL IVPB  Status:  Discontinued     500 mg 100 mL/hr over 30 Minutes Intravenous Every 12 hours 03/21/14 1701 03/25/14 1059   03/22/14 0100  vancomycin (VANCOCIN) IVPB 750 mg/150 ml premix  Status:  Discontinued     750 mg 150 mL/hr over 60 Minutes Intravenous Every 24 hours 03/21/14 1701 03/25/14 1059   03/20/14 2200  ertapenem (INVANZ) 1 g in sodium chloride 0.9 % 50 mL IVPB  Status:  Discontinued     1 g 100 mL/hr over 30 Minutes Intravenous Every 24 hours 03/20/14 0255 03/21/14 1043   03/20/14 2200  vancomycin (VANCOCIN) IVPB 1000 mg/200 mL premix  Status:  Discontinued     1,000 mg 200 mL/hr over 60 Minutes Intravenous Every 24 hours 03/20/14 0321 03/21/14 1043    03/19/14 2300  ertapenem (INVANZ) 1 g in sodium chloride 0.9 % 50 mL IVPB     1 g 100 mL/hr over 30 Minutes Intravenous  Once 03/19/14 2248 03/20/14 0106   03/19/14 2245  vancomycin (VANCOCIN) IVPB 1000 mg/200 mL premix     1,000 mg 200 mL/hr over 60 Minutes Intravenous  Once 03/19/14 2232 03/20/14 0014          Objective:   Filed Vitals:   03/24/14 1207 03/24/14 1340 03/24/14 2142 03/25/14 0457  BP: 125/55 128/60 112/54 146/68  Pulse: 104 102 109 92  Temp: 98.2 F (36.8 C) 98.6 F (37 C) 98.6 F (37 C) 98.8 F (37.1 C)  TempSrc: Oral Oral Oral Oral  Resp:   16  15  Height:      Weight:      SpO2: 100% 100% 100% 100%    Wt Readings from Last 3 Encounters:  03/20/14 73.664 kg (162 lb 6.4 oz)  03/13/14 75.751 kg (167 lb)  11/18/13 73.936 kg (163 lb)     Intake/Output Summary (Last 24 hours) at 03/25/14 1102 Last data filed at 03/25/14 0845  Gross per 24 hour  Intake   1820 ml  Output   1275 ml  Net    545 ml     Physical Exam  Awake Alert, Oriented X 2, No new F.N deficits, Normal affect [place].AT,PERRAL Supple Neck,No JVD, No cervical lymphadenopathy appriciated.  Symmetrical Chest wall movement, Good air movement bilaterally, CTAB RRR,No Gallops,Rubs or new Murmurs, No Parasternal Heave +ve B.Sounds, Abd Soft, No tenderness, No organomegaly appriciated, No rebound - guarding or rigidity. No Cyanosis, Clubbing or edema, No new Rash or bruise      Data Review   Micro Results Recent Results (from the past 240 hour(s))  CULTURE, BLOOD (ROUTINE X 2)     Status: None   Collection Time    03/22/14 10:00 AM      Result Value Ref Range Status   Specimen Description BLOOD LEFT ANTECUBITAL   Final   Special Requests BOTTLES DRAWN AEROBIC ONLY 4CC   Final   Culture  Setup Time     Final   Value: 03/22/2014 16:16     Performed at [facility]   Culture     Final   Value:        BLOOD CULTURE RECEIVED NO GROWTH TO DATE CULTURE WILL BE HELD FOR 5 DAYS  BEFORE ISSUING A FINAL NEGATIVE REPORT     Performed at [facility]   Report Status PENDING   Incomplete  URINE CULTURE     Status: None   Collection Time    03/22/14  3:40 PM      Result Value Ref Range Status   Specimen Description URINE, CATHETERIZED   Final   Special Requests NONE   Final   Culture  Setup Time     Final   Value: 03/22/2014 19:23     Performed at [facility] Count     Final   Value: NO GROWTH     Performed at [facility]   Culture     Final   Value: NO GROWTH     Performed at [facility]   Report Status 03/23/2014 FINAL   Final  CULTURE, BLOOD (ROUTINE X 2)     Status: None   Collection Time    03/22/14  6:40 PM      Result Value Ref Range Status   Specimen Description BLOOD CENTRAL LINE   Final   Special Requests BOTTLES DRAWN AEROBIC AND ANAEROBIC 5CC   Final   Culture  Setup Time     Final   Value: 03/23/2014 00:43     Performed at [facility]   Culture     Final   Value:        BLOOD CULTURE RECEIVED NO GROWTH TO DATE CULTURE WILL BE HELD FOR 5 DAYS BEFORE ISSUING A FINAL NEGATIVE REPORT     Performed at [facility]   Report Status PENDING   Incomplete  WOUND CULTURE     Status: None   Collection Time    03/23/14  2:45 PM      Result  Value Ref Range Status   Specimen Description WOUND ASPIRATE ABDOMEN   Final   Special Requests LEFT RETROPERITONEAL HEMATOMA   Final   Gram Stain     Final   Value: NO WBC SEEN     NO SQUAMOUS EPITHELIAL CELLS SEEN     NO ORGANISMS SEEN     [facility]   Culture     Final   Value: NO GROWTH 2 DAYS     [facility]   Report Status PENDING   Incomplete    Radiology Reports   Ct Abdomen Pelvis W Contrast  03/19/2014   CLINICAL DATA:  Abdominal pain, low back pain, nausea.  EXAM: CT ABDOMEN AND PELVIS WITH CONTRAST  TECHNIQUE: Multidetector CT imaging of the abdomen and pelvis was performed using the standard  protocol following bolus administration of intravenous contrast.  CONTRAST:  100mL OMNIPAQUE IOHEXOL 300 MG/ML  SOLN  COMPARISON:  None.  FINDINGS: Dependent atelectasis in the lung bases. Densely calcified coronary arteries. No effusions. Heart is upper limits normal in size. Oral contrast material is seen within the esophagus which may be related to dysmotility or reflux.  Prior cholecystectomy. Common bile duct is dilated, likely related to post cholecystectomy state. No focal abnormality within the liver, spleen, pancreas, adrenals. Areas of atrophy and scarring in the posterior upper and mid poles of the right kidney. Given the heavily calcified aorta and renal arteries, this is likely from chronic renovascular disease. Small cyst in the midpole of the left kidney. No hydronephrosis.  Scattered colonic diverticulosis. Large stool burden throughout the colon. Small bowel is decompressed. Stomach is unremarkable.  Gas noted within the endometrial canal of the uterus. The sigmoid colon is contiguous with the fundus of the uterus and there are several diverticula in the region, but no inflammatory process or visible fistula, but fistula is a concern if the patient has not had recent instrumentation.  Gas noted within the soft tissues extending from the skin surface in the midline of the buttocks deep to near the coccyx compatible with large decubitus ulcer.  There is also a gas collection noted to the left of the aortic bifurcation between the aortic bifurcation and the left psoas muscle in the left retroperitoneum (image 47). This is adjacent to a severely degenerated L4-5 disc with vacuum disc noted, but no definite connection to the disc space. This conceivably could represent a large herniated disc fragment, but other cause of gas cannot be excluded. Again, no significant inflammation surrounding this area.  IMPRESSION: Gas within the scratch head gas fills the endometrial canal of the uterus. The sigmoid colon  is a medially contiguous to the uterus with diverticulosis, but no inflammatory process noted at this time. 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 Given the lack of inflammation, this may be a chronic longstanding process.  Large sacral decubitus ulcer. Gas extends down to the coccyx. No bony changes to suggest osteomyelitis.  Small gas collection in the left retroperitoneum near the aortic bifurcation of unknown etiology. Question large herniated disc fragment from the adjacent degenerated disc. Again, no inflammation in this area to suggest active inflammatory process or to suggest this is an abscess.  These results were called by telephone at the time of interpretation on 03/19/2014 at 9:55 PM to Dr. Pricilla LovelessSCOTT GOLDSTON , who verbally acknowledged these results.   Electronically Signed   By: Charlett NoseKevin  Dover M.D.  On: 03/19/2014 21:55    CBC  Recent Labs Lab 03/19/14 1935 03/20/14 1610 03/21/14 0701 03/22/14 0010  03/22/14 2330 03/23/14 0520 03/23/14 2127 03/24/14 0437 03/25/14 0451  WBC 12.1* 10.8* 16.1* 13.9*  --   --  8.7  --  7.2 8.2  HGB 10.7* 10.7* 10.1* 7.1*  < > 7.6* 7.3* 8.5* 7.8* 8.2*  HCT 31.9* 32.9* 30.7* 21.7*  < > 22.6* 22.1* 25.3* 23.4* 24.7*  PLT 233 218 PLATELET CLUMPS NOTED ON SMEAR, COUNT APPEARS ADEQUATE 189  --   --  207  --  205 234  MCV 93.3 95.1 95.9 95.6  --   --  93.2  --  94.0 94.3  MCH 31.3 30.9 31.6 31.3  --   --  30.8  --  31.3 31.3  MCHC 33.5 32.5 32.9 32.7  --   --  33.0  --  33.3 33.2  RDW 13.6 13.8 13.8 13.9  --   --  14.4  --  14.2 14.0  LYMPHSABS 1.5 0.9  --   --   --   --   --   --   --   --   MONOABS 0.7 0.7  --   --   --   --   --   --   --   --   EOSABS 0.1 0.0  --   --   --   --   --   --   --   --   BASOSABS 0.0 0.0  --   --   --   --   --   --   --   --   < > = values in this interval not displayed.  Chemistries   Recent Labs Lab 03/19/14 1935 03/20/14 9604 03/21/14 0701  03/22/14 0010 03/23/14 0520 03/24/14 0437 03/25/14 0451  NA 138 137 138 136* 139 140 140  K 4.6 4.2 4.6 4.4 4.3 4.1 4.4  CL 99 98 98 99 104 104 104  CO2 24 26 25 24 24 26 27   GLUCOSE 103* 113* 102* 117* 115* 126* 99  BUN 26* 24* 41* 56* 50* 42* 30*  CREATININE 1.05 0.83 1.49* 1.79* 1.10 0.95 0.78  CALCIUM 10.0 9.4 9.1 8.6 8.4 8.7 8.8  AST 34 29  --   --   --   --   --   ALT 21 19  --   --   --   --   --   ALKPHOS 127* 119*  --   --   --   --   --   BILITOT 0.4 0.4  --   --   --   --   --    ------------------------------------------------------------------------------------------------------------------ estimated creatinine clearance is 52.5 ml/min (by C-G formula based on Cr of 0.78). ------------------------------------------------------------------------------------------------------------------ No results found for this basename: HGBA1C,  in the last 72 hours ------------------------------------------------------------------------------------------------------------------ No results found for this basename: CHOL, HDL, LDLCALC, TRIG, CHOLHDL, LDLDIRECT,  in the last 72 hours ------------------------------------------------------------------------------------------------------------------ No results found for this basename: TSH, T4TOTAL, FREET3, T3FREE, THYROIDAB,  in the last 72 hours ------------------------------------------------------------------------------------------------------------------  Recent Labs  03/22/14 1840 03/23/14 0500 03/23/14 1650  VITAMINB12  --  999* 878  FOLATE  --  >20.0 >20.0  FERRITIN  --  190 197  TIBC  --  210* 216*  IRON  --  76 46  RETICCTPCT 2.4  --  2.2    Coagulation profile  Recent Labs Lab 03/22/14 1840  INR 1.13  No results found for this basename: DDIMER,  in the last 72 hours  Cardiac Enzymes No results found for this basename: CK, CKMB, TROPONINI, MYOGLOBIN,  in the last 168  hours ------------------------------------------------------------------------------------------------------------------ No components found with this basename: POCBNP,      Time Spent in minutes 35   Susa Raring K M.D on 03/25/2014 at 11:02 AM  Between 7am to 7pm - Pager - [phone]  After 7pm go to [email or web address] - password [email or web address]  And look for the night coverage person covering for me after hours  [facility] Office  [phone]   **Disclaimer: This note may have been dictated with voice recognition software. Similar sounding words can inadvertently be transcribed and this note may contain transcription errors which may not have been corrected upon publication of note.**

## 2014-03-25 NOTE — Progress Notes (Signed)
Clinical Social Worker (CSW) contacted patient's daughter [name] [phone] to give bed offers. [name] wrote down offers and reported that she will probably take patient home and arrange 24 hour care if [employer] won't cover rehab. CSW reported that we should know [employer]'s decision on Monday.   Jetta LoutBailey Morgan, LCSWA Weekend CSW [phone]

## 2014-03-26 DIAGNOSIS — D638 Anemia in other chronic diseases classified elsewhere: Secondary | ICD-10-CM

## 2014-03-26 LAB — WOUND CULTURE
Culture: NO GROWTH
Gram Stain: NONE SEEN

## 2014-03-26 LAB — GLUCOSE, CAPILLARY
GLUCOSE-CAPILLARY: 121 mg/dL — AB (ref 70–99)
GLUCOSE-CAPILLARY: 118 mg/dL — AB (ref 70–99)
GLUCOSE-CAPILLARY: 130 mg/dL — AB (ref 70–99)
Glucose-Capillary: 119 mg/dL — ABNORMAL HIGH (ref 70–99)
Glucose-Capillary: 150 mg/dL — ABNORMAL HIGH (ref 70–99)

## 2014-03-26 MED ORDER — METOPROLOL TARTRATE 50 MG PO TABS
50.0000 mg | ORAL_TABLET | Freq: Two times a day (BID) | ORAL | Status: DC
  Administered 2014-03-26 – 2014-03-27 (×2): 50 mg via ORAL
  Filled 2014-03-26 (×4): qty 1

## 2014-03-26 NOTE — Progress Notes (Signed)
PATIENT DETAILS Name: [name] Age: 80 y.o. Sex: female Date of Birth: 04/06/1931 Admit Date: 03/19/2014 Admitting Physician Eduard Clos, MD [id number] Thomasena Edis, MD  Subjective: No major complaints  Assessment/Plan: Principal Problem: Low Back Pain -MRI showed an L-spine fluid therapy at L4 level also there was a suspicion of a aortic - Vertebral fistula. A CT angiogram of the abdomen and pelvis was also done which was inconclusive. Dr Thedore Mins discussed her status with Dr. Hart Rochester vascular surgeon in detail, he has ruled out retroperitoneal bleed or any aortic - Vertebral communication.  Dr Thedore Mins also discussed her case with Dr. Franky Macho in Dr. Danielle Dess neurosurgeon, who suggested patient is not an operative candidate, they have reviewed the scans, Dr. Danielle Dess recommends general medical treatment and comfort care. He is of the opinion that patient likely is up at L-spine fracture with blood in the spine and around the spinal process.  To rule out remote possibility of infection she underwent aspiration of L-spine fluid by IR on 03/23/2014. For now we'll place her on oral Doxy only stop vancomycin and meropenem clinically chances of infection are low, monitor aspirate cultures.  After detailed discussion with patient, her daughter [name], palliative care team it is decided that patient will be given a trial of medical treatment due to her discomfort, may continue oral antibiotics for few days, apply TLSO brace with activity, then to take home with home hospice. She is DO NOT RESUSCITATE.   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 -Currently compensated from CHF standpoint no acute, continue beta blocker fluid and salt restriction.  Anemia of chronic disease -Likely for dilution from IV fluids, reviewed her CT MRI in detail with vascular surgery, no signs of the retroperitoneal bleed, stable anemia panel, she received 2 units of  packed RBCs by 03/23/2012. Continue PPI. Supportive care only  ARF  - likely due to prerenal azotemia, also some injury from CT IV contrast which was needed to rule out aortic dissection. Resolved after hydration.   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 Seen by surgery. Not a surgical need at this time.  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  -Unlikely chronic per surgery, no need for surgical intervention.  Disposition: Remain inpatient-home with Hospice tomorrow  DVT Prophylaxis:  SCD's  Code Status:  DNR  Family Communication Spoke with daughter over the phone  Procedures:  None  CONSULTS:  vascular surgery and Palliative care  Time spent 40 minutes-which includes 50% of the time with face-to-face with patient/ family and coordinating care related to the above assessment and plan.    MEDICATIONS: Scheduled Meds: . doxycycline  100 mg Oral Q12H  . feeding supplement (ENSURE)  1 Container Oral BID BM  . lactose free nutrition  237 mL Oral Q24H  . metoprolol  2.5 mg Intravenous 4 times per day  . morphine  30 mg Oral Q12H  . pantoprazole (PROTONIX) IV  40 mg Intravenous Q12H  . sodium chloride  10-40 mL Intracatheter Q12H   Continuous Infusions: . dextrose 5 % and 0.45% NaCl 50 mL/hr at 03/25/14 1103   PRN Meds:.ALPRAZolam, diclofenac sodium, diphenhydrAMINE, hydrALAZINE, morphine injection, naLOXone (NARCAN)  injection, ondansetron (ZOFRAN) IV, sodium chloride, sodium chloride  Antibiotics: Anti-infectives   Start     Dose/Rate Route Frequency Ordered Stop   03/25/14 1130  doxycycline (VIBRA-TABS) tablet 100 mg     100 mg Oral  Every 12 hours 03/25/14 1059     03/22/14 0200  meropenem (MERREM) 500 mg in sodium chloride 0.9 % 50 mL IVPB  Status:  Discontinued     500 mg 100 mL/hr over 30 Minutes Intravenous Every 12 hours 03/21/14 1701 03/25/14 1059   03/22/14 0100  vancomycin (VANCOCIN) IVPB 750 mg/150 ml premix  Status:   Discontinued     750 mg 150 mL/hr over 60 Minutes Intravenous Every 24 hours 03/21/14 1701 03/25/14 1059   03/20/14 2200  ertapenem (INVANZ) 1 g in sodium chloride 0.9 % 50 mL IVPB  Status:  Discontinued     1 g 100 mL/hr over 30 Minutes Intravenous Every 24 hours 03/20/14 0255 03/21/14 1043   03/20/14 2200  vancomycin (VANCOCIN) IVPB 1000 mg/200 mL premix  Status:  Discontinued     1,000 mg 200 mL/hr over 60 Minutes Intravenous Every 24 hours 03/20/14 0321 03/21/14 1043   03/19/14 2300  ertapenem (INVANZ) 1 g in sodium chloride 0.9 % 50 mL IVPB     1 g 100 mL/hr over 30 Minutes Intravenous  Once 03/19/14 2248 03/20/14 0106   03/19/14 2245  vancomycin (VANCOCIN) IVPB 1000 mg/200 mL premix     1,000 mg 200 mL/hr over 60 Minutes Intravenous  Once 03/19/14 2232 03/20/14 0014       PHYSICAL EXAM: Vital signs in last 24 hours: Filed Vitals:   03/25/14 0457 03/25/14 1331 03/25/14 2117 03/26/14 0535  BP: 146/68 138/62 144/66 142/60  Pulse: 92 96 90 94  Temp: 98.8 F (37.1 C) 98.6 F (37 C) 98.5 F (36.9 C) 98 F (36.7 C)  TempSrc: Oral Oral Oral Oral  Resp: 15 16 15 17   Height:      Weight:      SpO2: 100% 100% 100% 99%    Weight change:  Filed Weights   03/20/14 0033  Weight: 73.664 kg (162 lb 6.4 oz)   Body mass index is 29.89 kg/(m^2).   Gen Exam: Awake  with clear speech.  Neck: Supple, No JVD.   Chest: B/L Clear.   CVS: S1 S2 Regular, no murmurs.  Abdomen: soft, BS +, non tender, non distended.  Extremities: no edema, lower extremities warm to touch.  Intake/Output from previous day:  Intake/Output Summary (Last 24 hours) at 03/26/14 1305 Last data filed at 03/26/14 1053  Gross per 24 hour  Intake   1300 ml  Output   2100 ml  Net   -800 ml     LAB RESULTS: CBC  Recent Labs Lab 03/19/14 1935 03/20/14 0611 03/21/14 0701 03/22/14 0010  03/22/14 2330 03/23/14 0520 03/23/14 2127 03/24/14 0437 03/25/14 0451  WBC 12.1* 10.8* 16.1* 13.9*  --   --   8.7  --  7.2 8.2  HGB 10.7* 10.7* 10.1* 7.1*  < > 7.6* 7.3* 8.5* 7.8* 8.2*  HCT 31.9* 32.9* 30.7* 21.7*  < > 22.6* 22.1* 25.3* 23.4* 24.7*  PLT 233 218 PLATELET CLUMPS NOTED ON SMEAR, COUNT APPEARS ADEQUATE 189  --   --  207  --  205 234  MCV 93.3 95.1 95.9 95.6  --   --  93.2  --  94.0 94.3  MCH 31.3 30.9 31.6 31.3  --   --  30.8  --  31.3 31.3  MCHC 33.5 32.5 32.9 32.7  --   --  33.0  --  33.3 33.2  RDW 13.6 13.8 13.8 13.9  --   --  14.4  --  14.2 14.0  LYMPHSABS 1.5 0.9  --   --   --   --   --   --   --   --   MONOABS 0.7 0.7  --   --   --   --   --   --   --   --   EOSABS 0.1 0.0  --   --   --   --   --   --   --   --   BASOSABS 0.0 0.0  --   --   --   --   --   --   --   --   < > = values in this interval not displayed.  Chemistries   Recent Labs Lab 03/21/14 0701 03/22/14 0010 03/23/14 0520 03/24/14 0437 03/25/14 0451  NA 138 136* 139 140 140  K 4.6 4.4 4.3 4.1 4.4  CL 98 99 104 104 104  CO2 25 24 24 26 27   GLUCOSE 102* 117* 115* 126* 99  BUN 41* 56* 50* 42* 30*  CREATININE 1.49* 1.79* 1.10 0.95 0.78  CALCIUM 9.1 8.6 8.4 8.7 8.8    CBG:  Recent Labs Lab 03/25/14 1935 03/25/14 2343 03/26/14 0344 03/26/14 0736 03/26/14 1201  GLUCAP 117* 114* 121* 118* 130*    GFR Estimated Creatinine Clearance: 52.5 ml/min (by C-G formula based on Cr of 0.78).  Coagulation profile  Recent Labs Lab 03/22/14 1840  INR 1.13    Cardiac Enzymes No results found for this basename: CK, CKMB, TROPONINI, MYOGLOBIN,  in the last 168 hours  No components found with this basename: POCBNP,  No results found for this basename: DDIMER,  in the last 72 hours No results found for this basename: HGBA1C,  in the last 72 hours No results found for this basename: CHOL, HDL, LDLCALC, TRIG, CHOLHDL, LDLDIRECT,  in the last 72 hours No results found for this basename: TSH, T4TOTAL, FREET3, T3FREE, THYROIDAB,  in the last 72 hours  Recent Labs  03/23/14 1650  VITAMINB12 878  FOLATE  >20.0  FERRITIN 197  TIBC 216*  IRON 46  RETICCTPCT 2.2   No results found for this basename: LIPASE, AMYLASE,  in the last 72 hours  Urine Studies No results found for this basename: UACOL, UAPR, USPG, UPH, UTP, UGL, UKET, UBIL, UHGB, UNIT, UROB, ULEU, UEPI, UWBC, URBC, UBAC, CAST, CRYS, UCOM, BILUA,  in the last 72 hours  MICROBIOLOGY: Recent Results (from the past 240 hour(s))  CULTURE, BLOOD (ROUTINE X 2)     Status: None   Collection Time    03/22/14 10:00 AM      Result Value Ref Range Status   Specimen Description BLOOD LEFT ANTECUBITAL   Final   Special Requests BOTTLES DRAWN AEROBIC ONLY 4CC   Final   Culture  Setup Time     Final   Value: 03/22/2014 16:16     Performed at [facility]   Culture     Final   Value:        BLOOD CULTURE RECEIVED NO GROWTH TO DATE CULTURE WILL BE HELD FOR 5 DAYS BEFORE ISSUING A FINAL NEGATIVE REPORT     Performed at [facility]   Report Status PENDING   Incomplete  URINE CULTURE     Status: None   Collection Time    03/22/14  3:40 PM      Result Value Ref Range Status   Specimen Description URINE, CATHETERIZED   Final   Special  Requests NONE   Final   Culture  Setup Time     Final   Value: 03/22/2014 19:23     Performed at [facility]   Colony Count     Final   Value: NO GROWTH     Performed at [facility]   Culture     Final   Value: NO GROWTH     Performed at [facility]   Report Status 03/23/2014 FINAL   Final  CULTURE, BLOOD (ROUTINE X 2)     Status: None   Collection Time    03/22/14  6:40 PM      Result Value Ref Range Status   Specimen Description BLOOD CENTRAL LINE   Final   Special Requests BOTTLES DRAWN AEROBIC AND ANAEROBIC 5CC   Final   Culture  Setup Time     Final   Value: 03/23/2014 00:43     Performed at [facility]   Culture     Final   Value:        BLOOD CULTURE RECEIVED NO GROWTH TO DATE CULTURE WILL BE HELD FOR 5 DAYS BEFORE ISSUING A FINAL NEGATIVE  REPORT     Performed at [facility]   Report Status PENDING   Incomplete  WOUND CULTURE     Status: None   Collection Time    03/23/14  2:45 PM      Result Value Ref Range Status   Specimen Description WOUND ASPIRATE ABDOMEN   Final   Special Requests LEFT RETROPERITONEAL HEMATOMA   Final   Gram Stain     Final   Value: NO WBC SEEN     NO SQUAMOUS EPITHELIAL CELLS SEEN     NO ORGANISMS SEEN     Performed at [facility]   Culture     Final   Value: NO GROWTH 2 DAYS     Performed at [facility]   Report Status 03/26/2014 FINAL   Final    RADIOLOGY STUDIES/RESULTS: Ct Head Wo Contrast  03/13/2014   CLINICAL DATA:  Headache and left-sided weakness. History of infarct in January 2015  EXAM: CT HEAD WITHOUT CONTRAST  CT CERVICAL SPINE WITHOUT CONTRAST  TECHNIQUE: Multidetector CT imaging of the head and cervical spine was performed following the standard protocol without intravenous contrast. Multiplanar CT image reconstructions of the cervical spine were also generated.  COMPARISON:  12/31/2013 and 02/25/2014  FINDINGS: CT HEAD FINDINGS  Remote MCA territory infarct on the left. No new/acute intracranial findings as hemispheric infarction or intracranial hemorrhage. No mass lesions. No extra-axial fluid collections are identified. The bony structures are intact. No acute fracture. Stable osteoporosis. The paranasal sinuses and mastoid air cells are stable. Remote surgical changes involving the right mastoid and chronic left mastoid and middle ear effusion.  CT CERVICAL SPINE FINDINGS  Stable posterior fusion hardware extending from C3-C6. No complicating features. In stable ununited dens fracture. The dens is tilting to the left. Stable mild canal narrowing and C1-2. Wide decompressive laminectomies at C3-4, C4-5 and C5-6. No acute cervical spine fracture. These cul base and C1 articulations are maintained.  IMPRESSION: 1. Remote left MCA territory infarct. 2. No acute  intracranial findings or mass lesion. 3. Chronic mastoid disease bilaterally with surgical changes noted on the right. 4. Remote ununited dens fracture. 5. Stable posterior fusion changes from C3-C6. 6. No acute cervical spine fracture.   Electronically Signed   By: Luan Pulling.D.  On: 03/13/2014 13:08   Ct Head Wo Contrast  02/25/2014   CLINICAL DATA:  Headache.  Prior cervical injury.  EXAM: CT HEAD WITHOUT CONTRAST  CT CERVICAL SPINE WITHOUT CONTRAST  TECHNIQUE: Multidetector CT imaging of the head and cervical spine was performed following the standard protocol without intravenous contrast. Multiplanar CT image reconstructions of the cervical spine were also generated.  COMPARISON:  Head CT 12/21/2013 cervical spine CT 12/25/2012  FINDINGS: CT HEAD FINDINGS  Skull and Sinuses:Chronic smooth calvarial thinning in the right parietal region. There has been mastoidectomy on the right, with clear mastoidectomy bowl. There is resected or erroded right more than left ossicles compatible with cholesteatoma. Chronic opacification of the left mid earlier and small mastoid cavity.  Orbits: Scleral banding on the right.  Brain: No evidence of acute abnormality, such as acute infarction, hemorrhage, hydrocephalus, or mass lesion/mass effect. There is a remote left temporal parietal region cortical and subcortical infarct. Chronic small vessel disease with patchy ischemic gliosis in the periventricular white matter and in the deep gray nuclei, especially at the thalamus. Generalized cerebral volume loss  CT CERVICAL SPINE FINDINGS  Remote type 2 dens fracture without osseous fusion. There is anterior and leftward displacement. Anteriorly, the fracture is displaced up to 3.5 mm, no worse than on cervical radiography 02/08/2014. Anterior movement of the posterior arch of C1 causes advanced spinal canal stenosis at this level, likely with cord compression. Smooth epidural high-density thickening in this region is  likely ligamentous thickening and response to the chronic motion, similar to neck CTA 09/30/2013.  Previously seen C5 body fracture has healed. There is posterior fixation hardware from C3-C6, without adverse feature. There is complete posterior osseous fusion across the fixed levels.  Rapidly progressive degenerative disc change at T1-T2 with severe disc narrowing and new sclerosis. There is chronic anterior subluxation at this level related to advanced facet osteoarthritis. There is ankylosis of the imaged more inferior thoracic levels, accelerating the degenerative change.  No acute fracture identified. There is no prevertebral swelling or gross cervical canal hematoma (high-density material noted above is likely ligamentous). Right tracheoesophageal nodularity appears continuous and isodense to the thyroid, suggesting thyroid nodule rather than parathyroid adenoma.  These results were called by telephone at the time of interpretation on 02/25/2014 at 11:07 AM to Dr. Raeford RazorSTEPHEN KOHUT , who verbally acknowledged these results.  IMPRESSION: 1. No evidence of acute intracranial or cervical spine injury. 2. Remote type 2 dens fracture without osseous union. There is evidence of motion at the fracture, resulting in advanced spinal canal stenosis. Reference cervical spine flexion and extension radiography 02/08/2014. 3. C3-C6 posterior fixation and decompressed laminectomies. No adverse findings. 4. Rapidly progressive degenerative disc disease and facet osteoarthritis at T1-T2, accelerated by the cervical spine fixation and mid thoracic ankylosis. 5. Chronic skullbase and intracranial findings noted above.   Electronically Signed   By: Tiburcio PeaJonathan  Watts M.D.   On: 02/25/2014 11:10   Ct Cervical Spine Wo Contrast  03/13/2014   CLINICAL DATA:  Headache and left-sided weakness. History of infarct in January 2015  EXAM: CT HEAD WITHOUT CONTRAST  CT CERVICAL SPINE WITHOUT CONTRAST  TECHNIQUE: Multidetector CT imaging of the  head and cervical spine was performed following the standard protocol without intravenous contrast. Multiplanar CT image reconstructions of the cervical spine were also generated.  COMPARISON:  12/31/2013 and 02/25/2014  FINDINGS: CT HEAD FINDINGS  Remote MCA territory infarct on the left. No new/acute intracranial findings as hemispheric infarction or intracranial hemorrhage. No  mass lesions. No extra-axial fluid collections are identified. The bony structures are intact. No acute fracture. Stable osteoporosis. The paranasal sinuses and mastoid air cells are stable. Remote surgical changes involving the right mastoid and chronic left mastoid and middle ear effusion.  CT CERVICAL SPINE FINDINGS  Stable posterior fusion hardware extending from C3-C6. No complicating features. In stable ununited dens fracture. The dens is tilting to the left. Stable mild canal narrowing and C1-2. Wide decompressive laminectomies at C3-4, C4-5 and C5-6. No acute cervical spine fracture. These cul base and C1 articulations are maintained.  IMPRESSION: 1. Remote left MCA territory infarct. 2. No acute intracranial findings or mass lesion. 3. Chronic mastoid disease bilaterally with surgical changes noted on the right. 4. Remote ununited dens fracture. 5. Stable posterior fusion changes from C3-C6. 6. No acute cervical spine fracture.   Electronically Signed   By: Loralie Champagne M.D.   On: 03/13/2014 13:08   Ct Cervical Spine Wo Contrast  02/25/2014   CLINICAL DATA:  Headache.  Prior cervical injury.  EXAM: CT HEAD WITHOUT CONTRAST  CT CERVICAL SPINE WITHOUT CONTRAST  TECHNIQUE: Multidetector CT imaging of the head and cervical spine was performed following the standard protocol without intravenous contrast. Multiplanar CT image reconstructions of the cervical spine were also generated.  COMPARISON:  Head CT 12/21/2013 cervical spine CT 12/25/2012  FINDINGS: CT HEAD FINDINGS  Skull and Sinuses:Chronic smooth calvarial thinning in the  right parietal region. There has been mastoidectomy on the right, with clear mastoidectomy bowl. There is resected or erroded right more than left ossicles compatible with cholesteatoma. Chronic opacification of the left mid earlier and small mastoid cavity.  Orbits: Scleral banding on the right.  Brain: No evidence of acute abnormality, such as acute infarction, hemorrhage, hydrocephalus, or mass lesion/mass effect. There is a remote left temporal parietal region cortical and subcortical infarct. Chronic small vessel disease with patchy ischemic gliosis in the periventricular white matter and in the deep gray nuclei, especially at the thalamus. Generalized cerebral volume loss  CT CERVICAL SPINE FINDINGS  Remote type 2 dens fracture without osseous fusion. There is anterior and leftward displacement. Anteriorly, the fracture is displaced up to 3.5 mm, no worse than on cervical radiography 02/08/2014. Anterior movement of the posterior arch of C1 causes advanced spinal canal stenosis at this level, likely with cord compression. Smooth epidural high-density thickening in this region is likely ligamentous thickening and response to the chronic motion, similar to neck CTA 09/30/2013.  Previously seen C5 body fracture has healed. There is posterior fixation hardware from C3-C6, without adverse feature. There is complete posterior osseous fusion across the fixed levels.  Rapidly progressive degenerative disc change at T1-T2 with severe disc narrowing and new sclerosis. There is chronic anterior subluxation at this level related to advanced facet osteoarthritis. There is ankylosis of the imaged more inferior thoracic levels, accelerating the degenerative change.  No acute fracture identified. There is no prevertebral swelling or gross cervical canal hematoma (high-density material noted above is likely ligamentous). Right tracheoesophageal nodularity appears continuous and isodense to the thyroid, suggesting thyroid  nodule rather than parathyroid adenoma.  These results were called by telephone at the time of interpretation on 02/25/2014 at 11:07 AM to Dr. Raeford Razor , who verbally acknowledged these results.  IMPRESSION: 1. No evidence of acute intracranial or cervical spine injury. 2. Remote type 2 dens fracture without osseous union. There is evidence of motion at the fracture, resulting in advanced spinal canal stenosis. Reference cervical spine  flexion and extension radiography 02/08/2014. 3. C3-C6 posterior fixation and decompressed laminectomies. No adverse findings. 4. Rapidly progressive degenerative disc disease and facet osteoarthritis at T1-T2, accelerated by the cervical spine fixation and mid thoracic ankylosis. 5. Chronic skullbase and intracranial findings noted above.   Electronically Signed   By: Tiburcio Pea M.D.   On: 02/25/2014 11:10   Mr Lumbar Spine W Wo Contrast  03/21/2014   CLINICAL DATA:  Back pain. Sacral decubitus. Previous lumbar surgery.  EXAM: MRI LUMBAR SPINE WITHOUT AND WITH CONTRAST  TECHNIQUE: Multiplanar and multiecho pulse sequences of the lumbar spine were obtained without and with intravenous contrast.  CONTRAST:  7mL MULTIHANCE GADOBENATE DIMEGLUMINE 529 MG/ML IV SOLN  COMPARISON:  CT abdomen and pelvis 03/19/2014. CT myelogram 11/25/2007.  FINDINGS: Chronic spondylosis is noted at L1-2, L3-4, L4-5, and L5-S1 with disc space narrowing. Discal hyperintensity at L4-5 is non worrisome, correlating with the recent CT which showed vacuum phenomenon and minor endplate enhancement postcontrast.  At L2, there is a fluid-filled cavity which splays the vertebral body. The inferior endplate is displaced downward, and the larger superior portion of the vertebral body remains normally aligned with the thoracolumbar spine. This fluid-filled cavity demonstrated the same density as the adjacent aorta on the prior CT. This cavity today Is larger when compared with a similar sagittal image on the  CT abdomen and pelvis from 2 days ago. 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 It is unclear if the fluid collection, which likely represents a pseudoaneurysm, represents rupture of a small aortic diverticulum which fractured the vertebral body, or if there was a compression fracture which lacerated the aorta, resulting in a pseudoaneurysm. CT angiography of the abdominal aorta, using dissection protocol to include pre and post-contrast images, is recommended for further evaluation.  Post infusion imaging demonstrates cloudlike enhancement of this L2 fluid cavity, suggesting blood pool effect.  Moderately severe spinal stenosis at the L3-4 level relates to posterior element hypertrophy and central protrusion. There has been previous laminectomy at this level. The disc space is almost completely collapsed. Moderately severe spinal stenosis is also present at L4-5 with central protrusion and posterior element hypertrophy with partial regrowth following laminectomy. Disc space narrowing at L5-S1 is noted without focal protrusion.  The air containing structure in the left retroperitoneum on CT does not clearly connect with the L4-5 disc space.  Incompletely evaluated lower sacrum with regard to the decubitus. If further investigation desired, targeted MR to the sacral area recommended.  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 See discussion above. CTA of the abdominal aorta using dissection protocol recommended. Findings discussed with care team.  Severe lumbar spondylosis with stenosis at L3-4 and L4-5. No features concerning for osteomyelitis or discitis however.  No clear-cut discal migration into the retroperitoneum on the left to correlate with the air containing structure noted on prior CT.   Electronically Signed   By: Davonna Belling M.D.    On: 03/21/2014 20:59   Ct Abdomen Pelvis W Contrast  03/19/2014   CLINICAL DATA:  Abdominal pain, low back pain, nausea.  EXAM: CT ABDOMEN AND PELVIS WITH CONTRAST  TECHNIQUE: Multidetector CT imaging of the abdomen and pelvis was performed using the standard protocol following bolus administration of intravenous contrast.  CONTRAST:  OMNIPAQUE IOHEXOL 300 MG/ML  SOLN  COMPARISON:  None.  FINDINGS: Dependent atelectasis  in the lung bases. Densely calcified coronary arteries. No effusions. Heart is upper limits normal in size. Oral contrast material is seen within the esophagus which may be related to dysmotility or reflux.  Prior cholecystectomy. Common bile duct is dilated, likely related to post cholecystectomy state. No focal abnormality within the liver, spleen, pancreas, adrenals. Areas of atrophy and scarring in the posterior upper and mid poles of the right kidney. Given the heavily calcified aorta and renal arteries, this is likely from chronic renovascular disease. Small cyst in the midpole of the left kidney. No hydronephrosis.  Scattered colonic diverticulosis. Large stool burden throughout the colon. Small bowel is decompressed. Stomach is unremarkable.  Gas noted within the endometrial canal of the uterus. The sigmoid colon is contiguous with the fundus of the uterus and there are several diverticula in the region, but no inflammatory process or visible fistula, but fistula is a concern if the patient has not had recent instrumentation.  Gas noted within the soft tissues extending from the skin surface in the midline of the buttocks deep to near the coccyx compatible with large decubitus ulcer.  There is also a gas collection noted to the left of the aortic bifurcation between the aortic bifurcation and the left psoas muscle in the left retroperitoneum (image 47). This is adjacent to a severely degenerated L4-5 disc with vacuum disc noted, but no definite connection to the disc space. This  conceivably could represent a large herniated disc fragment, but other cause of gas cannot be excluded. Again, no significant inflammation surrounding this area.  IMPRESSION: Gas within the scratch head gas fills the endometrial canal of the uterus. The sigmoid colon is a medially contiguous to the uterus with diverticulosis, but no inflammatory process noted at this time. 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 Given the lack of inflammation, this may be a chronic longstanding process.  Large sacral decubitus ulcer. Gas extends down to the coccyx. No bony changes to suggest osteomyelitis.  Small gas collection in the left retroperitoneum near the aortic bifurcation of unknown etiology. Question large herniated disc fragment from the adjacent degenerated disc. Again, no inflammation in this area to suggest active inflammatory process or to suggest this is an abscess.  These results were called by telephone at the time of interpretation on 03/19/2014 at 9:55 PM to Dr. Pricilla Loveless , who verbally acknowledged these results.   Electronically Signed   By: Charlett Nose M.D.   On: 03/19/2014 21:55   Ct Aspiration  03/23/2014   CLINICAL DATA:  Distraction of the L2 vertebral body with new paraspinal and retroperitoneal fluid.  EXAM: CT-GUIDED ASPIRATION OF THE LEFT RETROPERITONEAL FLUID.  Physician: Rachelle Hora. Lowella Dandy, MD  MEDICATIONS: Fentanyl 25 mcg  ANESTHESIA/SEDATION: Patient was monitored by a radiology nurse throughout the procedure  PROCEDURE: Informed consent was obtained for a CT-guided aspiration. The patient was placed on her right side. Images through the lower abdomen were obtained. The left retroperitoneal fluid was identified. The left flank was prepped and draped in sterile fashion. Skin was anesthetized with 1% lidocaine. An 18 gauge needle was directed into the left retroperitoneal high density fluid. Approximately 2 mL of bloody fluid was  obtained. Sample sent for culture. Needle removed without complication. Bandage was placed at the puncture site.  FINDINGS: There is high-density fluid along the left retroperitoneal space. Needle position was confirmed within this fluid. Small amount of bloody fluid was aspirated.  COMPLICATIONS:  None  IMPRESSION: CT-guided aspiration of the left retroperitoneal hematoma. Fluid was sent for culture.   Electronically Signed   By: Richarda Overlie M.D.   On: 03/23/2014 18:32   Dg Chest Port 1 View  03/22/2014   CLINICAL DATA:  Check central line position.  EXAM: PORTABLE CHEST - 1 VIEW  COMPARISON:  10/01/2013  FINDINGS: Right jugular central line is present. The catheter tip is in the lower SVC region. Hazy densities throughout both lungs are suggestive for edema. Left basilar densities suggest pleural fluid. Heart size is upper limits of normal. Negative for a pneumothorax. Surgical hardware in the cervical spine.  IMPRESSION: Central line tip in the SVC region.  Negative for a pneumothorax.  Hazy lung densities suggest mild edema.  Left basilar densities could represent pleural fluid and consolidation.   Electronically Signed   By: Richarda Overlie M.D.   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  EXAM: CTA ABDOMEN AND PELVIS wITHOUT AND WITH CONTRAST  TECHNIQUE: Multidetector CT imaging of the abdomen and pelvis was performed using the standard protocol during bolus administration of intravenous contrast. Multiplanar reconstructed images and MIPs were obtained and reviewed to evaluate the vascular anatomy.  CONTRAST:  OMNIPAQUE IOHEXOL 350 MG/ML SOLN  COMPARISON:  MRI lumbar spine 03/21/2014. CT abdomen and pelvis 03/19/2014.  FINDINGS: Noncontrast CT images demonstrate extensive calcification throughout the abdominal aorta and all major aortic branch vessels. Retrocrural and retroperitoneal fluid extending to the left  paraspinal and psoas region with increased density consistent with infection, hemorrhage or hematoma.  Contrast-enhanced images are obtained in the aortic phase. Lung bases demonstrate diffuse esophageal dilatation with fluid consistent with reflux or dysmotility. Moderate-sized bilateral pleural effusions with bilateral basilar atelectasis or consolidation. Cardiac enlargement.  The abdominal aorta is normal caliber. No evidence of aneurysm or dissection. The abdominal aorta, celiac axis, superior mesenteric artery, single left and duplicated right renal arteries, inferior mesenteric artery, and the left iliac, external iliac, internal iliac, and femoral arteries are patent. There is apparent focal occlusion with no flow demonstrated in the right common iliac artery. There is reconstitution of flow in the right internal and external iliac arteries with flow extending to the right femoral artery.  Since the previous study, there is interval development of angulation and distraction with bone loss through the body of the L2 vertebra. There is anterior displacement of the inferior body of L2 with posterior angulation with respect to the superior body. Interval distraction of the fragments. Bone cortical margins are indistinct through this area with poor definition between bone and surrounding soft tissues. This suggests a destructive bone process such as infection or metastasis. There additional lucent changes throughout the visualized thoracic and lumbar spine which could indicate diffuse metastatic disease or marked demineralization. The fluid collections demonstrated in the psoas and retrocrural regions bilaterally, greater on the left are new since the previous study. There is no obvious active contrast extravasation noted. However, the posterior margins of the aorta at the level of L2 are indistinct. Given the presence of abnormal changes in the bone and new fluid collections, presumed to represent hematoma,  bleeding from the aorta may be present. Alternatively, there could of been erosion into the lumbar vessel. Left periaortic gas collection remains present without change.  The liver, spleen, gallbladder, pancreas, adrenal glands, and retroperitoneal lymph nodes are unremarkable. There is flattening of the inferior  vena cava which may indicate hypovolemia. There is scarring in the kidneys bilaterally with asymmetric scarring in the posterior aspect of the right kidney. There is a decreased nephrogram in the posterior right kidney suggesting renal vascular disease. Stomach, small bowel, and colon are not abnormally distended. Ventral infraumbilical abdominal wall hernia containing fat.  Pelvis: The appendix is normal. Bladder is diffusely distended without wall thickening. No gas is demonstrated in the bladder. Gas is again demonstrated in the endometrium. This would be suspicious for infection or fistula. No free or loculated pelvic fluid collections. Diverticula in the sigmoid colon. Infiltration in the left ileus psoas region likely arises from the retroperitoneal hematoma above. Deep decubitus ulcerations over the sacrococcygeal spine again demonstrated.  Review of the MIP images confirms the above findings.  IMPRESSION: Interval development of distraction and angulation through the midbody of L2 with new retroperitoneal and iliopsoas hematoma, left greater than right. No definite active contrast extravasation is identified although loss of distinction of the posterior wall of the abdominal aorta may indicate aortic bleed.  Changes in the vertebral body suggest either infection or metastatic disease with pathologic fracture.  Diffuse vascular disease. 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 Infarct in the posterior right kidney. Bilateral pleural effusions with basilar atelectasis. Changes of large decubitus ulcers, gas in the left paraspinal region, and gas in  the endometrium were present on prior study and are without change.  These results were called by telephone at the time of interpretation on 03/21/2014 at 11:20 PM to The patient's nurse, Imelda, who verbally acknowledged these results.   Electronically Signed   By: Burman Nieves M.D.   On: 03/21/2014 23:26    Jeoffrey Massed, MD  [facility] Pager:[phone]  If 7PM-7AM, please contact night-coverage [email or web address] Password [email or web address] 03/26/2014, 1:05 PM   LOS: 7 days   **Disclaimer: This note may have been dictated with voice recognition software. Similar sounding words can inadvertently be transcribed and this note may contain transcription errors which may not have been corrected upon publication of note.**

## 2014-03-27 DIAGNOSIS — I5032 Chronic diastolic (congestive) heart failure: Secondary | ICD-10-CM

## 2014-03-27 DIAGNOSIS — I509 Heart failure, unspecified: Secondary | ICD-10-CM

## 2014-03-27 LAB — GLUCOSE, CAPILLARY
GLUCOSE-CAPILLARY: 73 mg/dL (ref 70–99)
GLUCOSE-CAPILLARY: 138 mg/dL — AB (ref 70–99)
Glucose-Capillary: 77 mg/dL (ref 70–99)
Glucose-Capillary: 99 mg/dL (ref 70–99)

## 2014-03-27 MED ORDER — ENSURE PUDDING PO PUDG: 1.0000 | Freq: Two times a day (BID) | ORAL | Status: AC

## 2014-03-27 MED ORDER — BISACODYL 10 MG RE SUPP: 10.0000 mg | Freq: Every day | RECTAL | Status: DC | PRN

## 2014-03-27 MED ORDER — ONDANSETRON HCL 4 MG PO TABS: 4.0000 mg | ORAL_TABLET | Freq: Three times a day (TID) | ORAL | Status: AC | PRN

## 2014-03-27 MED ORDER — MORPHINE SULFATE (CONCENTRATE) 10 MG /0.5 ML PO SOLN: 5.0000 mg | ORAL | Status: DC | PRN

## 2014-03-27 MED ORDER — MORPHINE SULFATE ER 30 MG PO TBCR: 30.0000 mg | EXTENDED_RELEASE_TABLET | Freq: Two times a day (BID) | ORAL | Status: AC

## 2014-03-27 MED ORDER — BOOST PLUS PO LIQD: 237.0000 mL | ORAL | Status: AC

## 2014-03-27 MED ORDER — METHOCARBAMOL 500 MG PO TABS: 500.0000 mg | ORAL_TABLET | Freq: Four times a day (QID) | ORAL | Status: AC | PRN

## 2014-03-27 MED ORDER — MORPHINE SULFATE (CONCENTRATE) 10 MG /0.5 ML PO SOLN
5.0000 mg | ORAL | Status: DC | PRN
  Administered 2014-03-27: 5 mg via ORAL
  Filled 2014-03-27: qty 0.5

## 2014-03-27 MED ORDER — ALPRAZOLAM 0.25 MG PO TABS: 0.2500 mg | ORAL_TABLET | Freq: Three times a day (TID) | ORAL | Status: AC | PRN

## 2014-03-27 MED ORDER — MORPHINE SULFATE 10 MG/5ML PO SOLN: 5.0000 mg | ORAL | Status: DC | PRN

## 2014-03-27 MED ORDER — MORPHINE SULFATE (CONCENTRATE) 10 MG /0.5 ML PO SOLN: 5.0000 mg | ORAL | Status: AC | PRN

## 2014-03-27 MED ORDER — POLYETHYLENE GLYCOL 3350 17 G PO PACK: 17.0000 g | PACK | Freq: Two times a day (BID) | ORAL | Status: AC

## 2014-03-27 MED ORDER — MORPHINE SULFATE 2 MG/ML IJ SOLN: 1.0000 mg | Freq: Once | INTRAMUSCULAR | Status: DC | PRN

## 2014-03-27 NOTE — Clinical Social Work Note (Signed)
Per RNCM, pt to be discharged home with hospice following. CSW has completed discharge packet and placed on pt's shadow chart. Per RN, currently awaiting hospital bed arrival at pt's home prior to discharge. CSW will continue to follow and assist with arranging transportation via EMS ([facility]) once medical equipment has arrived. RNCM confirmed pt's home address at bedside with pt's daughter.  If pt is to be discharged after hours, RN please call for ambulance transportation at [facility] [phone]).  Marcelline DeistEmily Keshon Markovitz, MSW, [facility] CenterCSWA Licensed Clinical Social Worker [phone] and [phone] [phone]

## 2014-03-27 NOTE — Discharge Summary (Signed)
PATIENT DETAILS Name: [name] Age: 80 y.o. Sex: female Date of Birth: 1931/03/17 MRN: [id number]. Admit Date: 03/19/2014 Admitting Physician: Eduard Clos, MD JYN:[id number],HYQMVH Thomasena Edis, MD  Recommendations for Outpatient Follow-up:  Optimize narcotics regimen, initiate Home Hospice  PRIMARY DISCHARGE DIAGNOSIS:  Principal Problem:   Sacral decubitus ulcer Active Problems:   CHF (congestive heart failure)   Hypertension   Colouterine fistula   Low back pain   Palliative care encounter   Weakness generalized      PAST MEDICAL HISTORY: Past Medical History  Diagnosis Date  . CHF (congestive heart failure)   . Coronary artery disease   . Hypertension   . Arthritis   . Edema of extremities   . Anxiety   . HLD (hyperlipidemia)   . Peripheral vascular disease   . Stroke Jan 2015    left sided weakness, trouble speaking  . Cataracts, bilateral   . Detached retina     right eye  . Decubitus ulcer of sacral area     STAGE 4    DISCHARGE MEDICATIONS:   Medication List    STOP taking these medications       aspirin 325 MG tablet     losartan 50 MG tablet  Commonly known as:  COZAAR     naproxen sodium 220 MG tablet  Commonly known as:  ANAPROX     oxyCODONE-acetaminophen 10-325 MG per tablet  Commonly known as:  PERCOCET      TAKE these medications       ALPRAZolam 0.25 MG tablet  Commonly known as:  XANAX  Take 1 tablet (0.25 mg total) by mouth 3 (three) times daily as needed for anxiety.     BIOFREEZE EX  Apply 1 application topically daily as needed (pain).     CALCIUM 600 + D PO  Take 1 tablet by mouth daily.     diclofenac sodium 1 % Gel  Commonly known as:  VOLTAREN  Apply 2 g topically 2 (two) times daily as needed (pain).     feeding supplement (ENSURE) Pudg  Take 1 Container by mouth 2 (two) times daily between meals.     lactose free nutrition Liqd  Take 237 mLs by mouth daily.     furosemide 20 MG tablet  Commonly  known as:  LASIX  Take 20 mg by mouth daily.     methocarbamol 500 MG tablet  Commonly known as:  ROBAXIN  Take 1 tablet (500 mg total) by mouth every 6 (six) hours as needed for muscle spasms.     metoprolol 50 MG tablet  Commonly known as:  LOPRESSOR  Take 50 mg by mouth 2 (two) times daily.     morphine 30 MG 12 hr tablet  Commonly known as:  MS CONTIN  Take 1 tablet (30 mg total) by mouth every 12 (twelve) hours.     morphine 10 MG/5ML solution  Take 2.5 mLs (5 mg total) by mouth every 4 (four) hours as needed for severe pain.     multivitamin with minerals Tabs tablet  Take 1 tablet by mouth daily.     ondansetron 4 MG tablet  Commonly known as:  ZOFRAN  Take 1 tablet (4 mg total) by mouth every 8 (eight) hours as needed for nausea or vomiting.     polyethylene glycol packet  Commonly known as:  MIRALAX / GLYCOLAX  Take 17 g by mouth 2 (two) times daily.     pramipexole 0.5 MG tablet  Commonly known as:  MIRAPEX  Take 0.5 mg by mouth at bedtime.     pravastatin 80 MG tablet  Commonly known as:  PRAVACHOL  Take 80 mg by mouth every evening.        ALLERGIES:   Allergies  Allergen Reactions  . Penicillins Hives, Shortness Of Breath and Nausea Only    Throat closed up and had hives and nausea. Tolerates carbapenems.    BRIEF HPI:  See H&P, Labs, Consult and Test reports for all details in brief, is a 80 y.o. female with history of sacral decubitus ulcer, progressive quadriparesis with history of cervical fusion surgery, CHF, CVA, CAD presented with increasing low back pain over  few days prior to admission.In the ER patient had CT abdomen and pelvis which showed possible colo-uterine fistula and also gas around the coccygeal area with no definite signs of osteomyelitis. She was then admitted for further eval and treatment.  CONSULTATIONS:   vascular surgery and Interventional Radiology  PERTINENT RADIOLOGIC STUDIES: Ct Head Wo Contrast  03/13/2014   CLINICAL  DATA:  Headache and left-sided weakness. History of infarct in January 2015  EXAM: CT HEAD WITHOUT CONTRAST  CT CERVICAL SPINE WITHOUT CONTRAST  TECHNIQUE: Multidetector CT imaging of the head and cervical spine was performed following the standard protocol without intravenous contrast. Multiplanar CT image reconstructions of the cervical spine were also generated.  COMPARISON:  12/31/2013 and 02/25/2014  FINDINGS: CT HEAD FINDINGS  Remote MCA territory infarct on the left. No new/acute intracranial findings as hemispheric infarction or intracranial hemorrhage. No mass lesions. No extra-axial fluid collections are identified. The bony structures are intact. No acute fracture. Stable osteoporosis. The paranasal sinuses and mastoid air cells are stable. Remote surgical changes involving the right mastoid and chronic left mastoid and middle ear effusion.  CT CERVICAL SPINE FINDINGS  Stable posterior fusion hardware extending from C3-C6. No complicating features. In stable ununited dens fracture. The dens is tilting to the left. Stable mild canal narrowing and C1-2. Wide decompressive laminectomies at C3-4, C4-5 and C5-6. No acute cervical spine fracture. These cul base and C1 articulations are maintained.  IMPRESSION: 1. Remote left MCA territory infarct. 2. No acute intracranial findings or mass lesion. 3. Chronic mastoid disease bilaterally with surgical changes noted on the right. 4. Remote ununited dens fracture. 5. Stable posterior fusion changes from C3-C6. 6. No acute cervical spine fracture.   Electronically Signed   By: Loralie Champagne M.D.   On: 03/13/2014 13:08   Ct Head Wo Contrast  02/25/2014   CLINICAL DATA:  Headache.  Prior cervical injury.  EXAM: CT HEAD WITHOUT CONTRAST  CT CERVICAL SPINE WITHOUT CONTRAST  TECHNIQUE: Multidetector CT imaging of the head and cervical spine was performed following the standard protocol without intravenous contrast. Multiplanar CT image reconstructions of the cervical  spine were also generated.  COMPARISON:  Head CT 12/21/2013 cervical spine CT 12/25/2012  FINDINGS: CT HEAD FINDINGS  Skull and Sinuses:Chronic smooth calvarial thinning in the right parietal region. There has been mastoidectomy on the right, with clear mastoidectomy bowl. There is resected or erroded right more than left ossicles compatible with cholesteatoma. Chronic opacification of the left mid earlier and small mastoid cavity.  Orbits: Scleral banding on the right.  Brain: No evidence of acute abnormality, such as acute infarction, hemorrhage, hydrocephalus, or mass lesion/mass effect. There is a remote left temporal parietal region cortical and subcortical infarct. Chronic small vessel disease with patchy ischemic gliosis in the periventricular white matter  and in the deep gray nuclei, especially at the thalamus. Generalized cerebral volume loss  CT CERVICAL SPINE FINDINGS  Remote type 2 dens fracture without osseous fusion. There is anterior and leftward displacement. Anteriorly, the fracture is displaced up to 3.5 mm, no worse than on cervical radiography 02/08/2014. Anterior movement of the posterior arch of C1 causes advanced spinal canal stenosis at this level, likely with cord compression. Smooth epidural high-density thickening in this region is likely ligamentous thickening and response to the chronic motion, similar to neck CTA 09/30/2013.  Previously seen C5 body fracture has healed. There is posterior fixation hardware from C3-C6, without adverse feature. There is complete posterior osseous fusion across the fixed levels.  Rapidly progressive degenerative disc change at T1-T2 with severe disc narrowing and new sclerosis. There is chronic anterior subluxation at this level related to advanced facet osteoarthritis. There is ankylosis of the imaged more inferior thoracic levels, accelerating the degenerative change.  No acute fracture identified. There is no prevertebral swelling or gross cervical  canal hematoma (high-density material noted above is likely ligamentous). Right tracheoesophageal nodularity appears continuous and isodense to the thyroid, suggesting thyroid nodule rather than parathyroid adenoma.  These results were called by telephone at the time of interpretation on 02/25/2014 at 11:07 AM to Dr. Raeford Razor , who verbally acknowledged these results.  IMPRESSION: 1. No evidence of acute intracranial or cervical spine injury. 2. Remote type 2 dens fracture without osseous union. There is evidence of motion at the fracture, resulting in advanced spinal canal stenosis. Reference cervical spine flexion and extension radiography 02/08/2014. 3. C3-C6 posterior fixation and decompressed laminectomies. No adverse findings. 4. Rapidly progressive degenerative disc disease and facet osteoarthritis at T1-T2, accelerated by the cervical spine fixation and mid thoracic ankylosis. 5. Chronic skullbase and intracranial findings noted above.   Electronically Signed   By: Tiburcio Pea M.D.   On: 02/25/2014 11:10   Ct Cervical Spine Wo Contrast  03/13/2014   CLINICAL DATA:  Headache and left-sided weakness. History of infarct in January 2015  EXAM: CT HEAD WITHOUT CONTRAST  CT CERVICAL SPINE WITHOUT CONTRAST  TECHNIQUE: Multidetector CT imaging of the head and cervical spine was performed following the standard protocol without intravenous contrast. Multiplanar CT image reconstructions of the cervical spine were also generated.  COMPARISON:  12/31/2013 and 02/25/2014  FINDINGS: CT HEAD FINDINGS  Remote MCA territory infarct on the left. No new/acute intracranial findings as hemispheric infarction or intracranial hemorrhage. No mass lesions. No extra-axial fluid collections are identified. The bony structures are intact. No acute fracture. Stable osteoporosis. The paranasal sinuses and mastoid air cells are stable. Remote surgical changes involving the right mastoid and chronic left mastoid and middle ear  effusion.  CT CERVICAL SPINE FINDINGS  Stable posterior fusion hardware extending from C3-C6. No complicating features. In stable ununited dens fracture. The dens is tilting to the left. Stable mild canal narrowing and C1-2. Wide decompressive laminectomies at C3-4, C4-5 and C5-6. No acute cervical spine fracture. These cul base and C1 articulations are maintained.  IMPRESSION: 1. Remote left MCA territory infarct. 2. No acute intracranial findings or mass lesion. 3. Chronic mastoid disease bilaterally with surgical changes noted on the right. 4. Remote ununited dens fracture. 5. Stable posterior fusion changes from C3-C6. 6. No acute cervical spine fracture.   Electronically Signed   By: Loralie Champagne M.D.   On: 03/13/2014 13:08   Ct Cervical Spine Wo Contrast  02/25/2014   CLINICAL DATA:  Headache.  Prior cervical injury.  EXAM: CT HEAD WITHOUT CONTRAST  CT CERVICAL SPINE WITHOUT CONTRAST  TECHNIQUE: Multidetector CT imaging of the head and cervical spine was performed following the standard protocol without intravenous contrast. Multiplanar CT image reconstructions of the cervical spine were also generated.  COMPARISON:  Head CT 12/21/2013 cervical spine CT 12/25/2012  FINDINGS: CT HEAD FINDINGS  Skull and Sinuses:Chronic smooth calvarial thinning in the right parietal region. There has been mastoidectomy on the right, with clear mastoidectomy bowl. There is resected or erroded right more than left ossicles compatible with cholesteatoma. Chronic opacification of the left mid earlier and small mastoid cavity.  Orbits: Scleral banding on the right.  Brain: No evidence of acute abnormality, such as acute infarction, hemorrhage, hydrocephalus, or mass lesion/mass effect. There is a remote left temporal parietal region cortical and subcortical infarct. Chronic small vessel disease with patchy ischemic gliosis in the periventricular white matter and in the deep gray nuclei, especially at the thalamus. Generalized  cerebral volume loss  CT CERVICAL SPINE FINDINGS  Remote type 2 dens fracture without osseous fusion. There is anterior and leftward displacement. Anteriorly, the fracture is displaced up to 3.5 mm, no worse than on cervical radiography 02/08/2014. Anterior movement of the posterior arch of C1 causes advanced spinal canal stenosis at this level, likely with cord compression. Smooth epidural high-density thickening in this region is likely ligamentous thickening and response to the chronic motion, similar to neck CTA 09/30/2013.  Previously seen C5 body fracture has healed. There is posterior fixation hardware from C3-C6, without adverse feature. There is complete posterior osseous fusion across the fixed levels.  Rapidly progressive degenerative disc change at T1-T2 with severe disc narrowing and new sclerosis. There is chronic anterior subluxation at this level related to advanced facet osteoarthritis. There is ankylosis of the imaged more inferior thoracic levels, accelerating the degenerative change.  No acute fracture identified. There is no prevertebral swelling or gross cervical canal hematoma (high-density material noted above is likely ligamentous). Right tracheoesophageal nodularity appears continuous and isodense to the thyroid, suggesting thyroid nodule rather than parathyroid adenoma.  These results were called by telephone at the time of interpretation on 02/25/2014 at 11:07 AM to Dr. Raeford Razor , who verbally acknowledged these results.  IMPRESSION: 1. No evidence of acute intracranial or cervical spine injury. 2. Remote type 2 dens fracture without osseous union. There is evidence of motion at the fracture, resulting in advanced spinal canal stenosis. Reference cervical spine flexion and extension radiography 02/08/2014. 3. C3-C6 posterior fixation and decompressed laminectomies. No adverse findings. 4. Rapidly progressive degenerative disc disease and facet osteoarthritis at T1-T2, accelerated by  the cervical spine fixation and mid thoracic ankylosis. 5. Chronic skullbase and intracranial findings noted above.   Electronically Signed   By: Tiburcio Pea M.D.   On: 02/25/2014 11:10   Mr Lumbar Spine W Wo Contrast  03/21/2014   CLINICAL DATA:  Back pain. Sacral decubitus. Previous lumbar surgery.  EXAM: MRI LUMBAR SPINE WITHOUT AND WITH CONTRAST  TECHNIQUE: Multiplanar and multiecho pulse sequences of the lumbar spine were obtained without and with intravenous contrast.  CONTRAST:  7mL MULTIHANCE GADOBENATE DIMEGLUMINE 529 MG/ML IV SOLN  COMPARISON:  CT abdomen and pelvis 03/19/2014. CT myelogram 11/25/2007.  FINDINGS: Chronic spondylosis is noted at L1-2, L3-4, L4-5, and L5-S1 with disc space narrowing. Discal hyperintensity at L4-5 is non worrisome, correlating with the recent CT which showed vacuum phenomenon and minor endplate enhancement postcontrast.  At L2, there is a fluid-filled  cavity which splays the vertebral body. The inferior endplate is displaced downward, and the larger superior portion of the vertebral body remains normally aligned with the thoracolumbar spine. This fluid-filled cavity demonstrated the same density as the adjacent aorta on the prior CT. This cavity today Is larger when compared with a similar sagittal image on the CT abdomen and pelvis from 2 days ago. 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 It is unclear if the fluid collection, which likely represents a pseudoaneurysm, represents rupture of a small aortic diverticulum which fractured the vertebral body, or if there was a compression fracture which lacerated the aorta, resulting in a pseudoaneurysm. CT angiography of the abdominal aorta, using dissection protocol to include pre and post-contrast images, is recommended for further evaluation.  Post infusion imaging demonstrates cloudlike enhancement of this L2 fluid cavity, suggesting blood pool effect.   Moderately severe spinal stenosis at the L3-4 level relates to posterior element hypertrophy and central protrusion. There has been previous laminectomy at this level. The disc space is almost completely collapsed. Moderately severe spinal stenosis is also present at L4-5 with central protrusion and posterior element hypertrophy with partial regrowth following laminectomy. Disc space narrowing at L5-S1 is noted without focal protrusion.  The air containing structure in the left retroperitoneum on CT does not clearly connect with the L4-5 disc space.  Incompletely evaluated lower sacrum with regard to the decubitus. If further investigation desired, targeted MR to the sacral area recommended.  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 See discussion above. CTA of the abdominal aorta using dissection protocol recommended. Findings discussed with care team.  Severe lumbar spondylosis with stenosis at L3-4 and L4-5. No features concerning for osteomyelitis or discitis however.  No clear-cut discal migration into the retroperitoneum on the left to correlate with the air containing structure noted on prior CT.   Electronically Signed   By: Davonna BellingJohn  Curnes M.D.   On: 03/21/2014 20:59   Ct Abdomen Pelvis W Contrast  03/19/2014   CLINICAL DATA:  Abdominal pain, low back pain, nausea.  EXAM: CT ABDOMEN AND PELVIS WITH CONTRAST  TECHNIQUE: Multidetector CT imaging of the abdomen and pelvis was performed using the standard protocol following bolus administration of intravenous contrast.  CONTRAST:  100mL OMNIPAQUE IOHEXOL 300 MG/ML  SOLN  COMPARISON:  None.  FINDINGS: Dependent atelectasis in the lung bases. Densely calcified coronary arteries. No effusions. Heart is upper limits normal in size. Oral contrast material is seen within the esophagus which may be related to dysmotility or reflux.  Prior cholecystectomy. Common  bile duct is dilated, likely related to post cholecystectomy state. No focal abnormality within the liver, spleen, pancreas, adrenals. Areas of atrophy and scarring in the posterior upper and mid poles of the right kidney. Given the heavily calcified aorta and renal arteries, this is likely from chronic renovascular disease. Small cyst in the midpole of the left kidney. No hydronephrosis.  Scattered colonic diverticulosis. Large stool burden throughout the colon. Small bowel is decompressed. Stomach is unremarkable.  Gas noted within the endometrial canal of the uterus. The sigmoid colon is contiguous with the fundus of the uterus and there are several diverticula in the region, but no inflammatory process or visible fistula, but fistula is a concern if the patient has not had recent instrumentation.  Gas noted within the soft tissues extending from the skin  surface in the midline of the buttocks deep to near the coccyx compatible with large decubitus ulcer.  There is also a gas collection noted to the left of the aortic bifurcation between the aortic bifurcation and the left psoas muscle in the left retroperitoneum (image 47). This is adjacent to a severely degenerated L4-5 disc with vacuum disc noted, but no definite connection to the disc space. This conceivably could represent a large herniated disc fragment, but other cause of gas cannot be excluded. Again, no significant inflammation surrounding this area.  IMPRESSION: Gas within the scratch head gas fills the endometrial canal of the uterus. The sigmoid colon is a medially contiguous to the uterus with diverticulosis, but no inflammatory process noted at this time. 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 Given the lack of inflammation, this may be a chronic longstanding process.  Large sacral decubitus ulcer. Gas extends down to the coccyx. No bony changes to suggest osteomyelitis.  Small gas  collection in the left retroperitoneum near the aortic bifurcation of unknown etiology. Question large herniated disc fragment from the adjacent degenerated disc. Again, no inflammation in this area to suggest active inflammatory process or to suggest this is an abscess.  These results were called by telephone at the time of interpretation on 03/19/2014 at 9:55 PM to Dr. Pricilla Loveless , who verbally acknowledged these results.   Electronically Signed   By: Charlett Nose M.D.   On: 03/19/2014 21:55   Ct Aspiration  03/23/2014   CLINICAL DATA:  Distraction of the L2 vertebral body with new paraspinal and retroperitoneal fluid.  EXAM: CT-GUIDED ASPIRATION OF THE LEFT RETROPERITONEAL FLUID.  Physician: Rachelle Hora. Lowella Dandy, MD  MEDICATIONS: Fentanyl 25 mcg  ANESTHESIA/SEDATION: Patient was monitored by a radiology nurse throughout the procedure  PROCEDURE: Informed consent was obtained for a CT-guided aspiration. The patient was placed on her right side. Images through the lower abdomen were obtained. The left retroperitoneal fluid was identified. The left flank was prepped and draped in sterile fashion. Skin was anesthetized with 1% lidocaine. An 18 gauge needle was directed into the left retroperitoneal high density fluid. Approximately 2 mL of bloody fluid was obtained. Sample sent for culture. Needle removed without complication. Bandage was placed at the puncture site.  FINDINGS: There is high-density fluid along the left retroperitoneal space. Needle position was confirmed within this fluid. Small amount of bloody fluid was aspirated.  COMPLICATIONS: None  IMPRESSION: CT-guided aspiration of the left retroperitoneal hematoma. Fluid was sent for culture.   Electronically Signed   By: Richarda Overlie M.D.   On: 03/23/2014 18:32   Dg Chest Port 1 View  03/22/2014   CLINICAL DATA:  Check central line position.  EXAM: PORTABLE CHEST - 1 VIEW  COMPARISON:  10/01/2013  FINDINGS: Right jugular central line is present. The catheter  tip is in the lower SVC region. Hazy densities throughout both lungs are suggestive for edema. Left basilar densities suggest pleural fluid. Heart size is upper limits of normal. Negative for a pneumothorax. Surgical hardware in the cervical spine.  IMPRESSION: Central line tip in the SVC region.  Negative for a pneumothorax.  Hazy lung densities suggest mild edema.  Left basilar densities could represent pleural fluid and consolidation.   Electronically Signed   By: Richarda Overlie M.D.   On: 03/22/2014 19:16   Ct Angio Abd/pel W/ And/or W/o  03/21/2014   CLINICAL DATA:  Query aortic pseudoaneurysm on  MRI lumbar spine.  EXAM: CTA ABDOMEN AND PELVIS wITHOUT AND WITH CONTRAST  TECHNIQUE: Multidetector CT imaging of the abdomen and pelvis was performed using the standard protocol during bolus administration of intravenous contrast. Multiplanar reconstructed images and MIPs were obtained and reviewed to evaluate the vascular anatomy.  CONTRAST:  OMNIPAQUE IOHEXOL 350 MG/ML SOLN  COMPARISON:  MRI lumbar spine 03/21/2014. CT abdomen and pelvis 03/19/2014.  FINDINGS: Noncontrast CT images demonstrate extensive calcification throughout the abdominal aorta and all major aortic branch vessels. Retrocrural and retroperitoneal fluid extending to the left paraspinal and psoas region with increased density consistent with infection, hemorrhage or hematoma.  Contrast-enhanced images are obtained in the aortic phase. Lung bases demonstrate diffuse esophageal dilatation with fluid consistent with reflux or dysmotility. Moderate-sized bilateral pleural effusions with bilateral basilar atelectasis or consolidation. Cardiac enlargement.  The abdominal aorta is normal caliber. No evidence of aneurysm or dissection. The abdominal aorta, celiac axis, superior mesenteric artery, single left and duplicated right renal arteries, inferior mesenteric artery, and the left iliac, external iliac, internal iliac, and femoral arteries are  patent. There is apparent focal occlusion with no flow demonstrated in the right common iliac artery. There is reconstitution of flow in the right internal and external iliac arteries with flow extending to the right femoral artery.  Since the previous study, there is interval development of angulation and distraction with bone loss through the body of the L2 vertebra. There is anterior displacement of the inferior body of L2 with posterior angulation with respect to the superior body. Interval distraction of the fragments. Bone cortical margins are indistinct through this area with poor definition between bone and surrounding soft tissues. This suggests a destructive bone process such as infection or metastasis. There additional lucent changes throughout the visualized thoracic and lumbar spine which could indicate diffuse metastatic disease or marked demineralization. The fluid collections demonstrated in the psoas and retrocrural regions bilaterally, greater on the left are new since the previous study. There is no obvious active contrast extravasation noted. However, the posterior margins of the aorta at the level of L2 are indistinct. Given the presence of abnormal changes in the bone and new fluid collections, presumed to represent hematoma, bleeding from the aorta may be present. Alternatively, there could of been erosion into the lumbar vessel. Left periaortic gas collection remains present without change.  The liver, spleen, gallbladder, pancreas, adrenal glands, and retroperitoneal lymph nodes are unremarkable. There is flattening of the inferior vena cava which may indicate hypovolemia. There is scarring in the kidneys bilaterally with asymmetric scarring in the posterior aspect of the right kidney. There is a decreased nephrogram in the posterior right kidney suggesting renal vascular disease. Stomach, small bowel, and colon are not abnormally distended. Ventral infraumbilical abdominal wall hernia  containing fat.  Pelvis: The appendix is normal. Bladder is diffusely distended without wall thickening. No gas is demonstrated in the bladder. Gas is again demonstrated in the endometrium. This would be suspicious for infection or fistula. No free or loculated pelvic fluid collections. Diverticula in the sigmoid colon. Infiltration in the left ileus psoas region likely arises from the retroperitoneal hematoma above. Deep decubitus ulcerations over the sacrococcygeal spine again demonstrated.  Review of the MIP images confirms the above findings.  IMPRESSION: Interval development of distraction and angulation through the midbody of L2 with new retroperitoneal and iliopsoas hematoma, left greater than right. No definite active contrast extravasation is identified although loss of distinction of the posterior wall of the abdominal  aorta may indicate aortic bleed.  Changes in the vertebral body suggest either infection or metastatic disease with pathologic fracture.  Diffuse vascular disease. 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 Infarct in the posterior right kidney. Bilateral pleural effusions with basilar atelectasis. Changes of large decubitus ulcers, gas in the left paraspinal region, and gas in the endometrium were present on prior study and are without change.  These results were called by telephone at the time of interpretation on 03/21/2014 at 11:20 PM to The patient's nurse, Imelda, who verbally acknowledged these results.   Electronically Signed   By: Burman Nieves M.D.   On: 03/21/2014 23:26     PERTINENT LAB RESULTS: CBC:  Recent Labs  03/25/14 0451  WBC 8.2  HGB 8.2*  HCT 24.7*  PLT 234   CMET CMP     Component Value Date/Time   NA 140 03/25/2014 0451   K 4.4 03/25/2014 0451   CL 104 03/25/2014 0451   CO2 27 03/25/2014 0451   GLUCOSE 99 03/25/2014 0451   BUN 30* 03/25/2014 0451   CREATININE 0.78 03/25/2014 0451   CALCIUM 8.8  03/25/2014 0451   PROT 7.0 03/20/2014 0611   ALBUMIN 3.1* 03/20/2014 0611   AST 29 03/20/2014 0611   ALT 19 03/20/2014 0611   ALKPHOS 119* 03/20/2014 0611   BILITOT 0.4 03/20/2014 0611   GFRNONAA 77* 03/25/2014 0451   GFRAA 89* 03/25/2014 0451    GFR Estimated Creatinine Clearance: 52.5 ml/min (by C-G formula based on Cr of 0.78). No results found for this basename: LIPASE, AMYLASE,  in the last 72 hours No results found for this basename: CKTOTAL, CKMB, CKMBINDEX, TROPONINI,  in the last 72 hours No components found with this basename: POCBNP,  No results found for this basename: DDIMER,  in the last 72 hours No results found for this basename: HGBA1C,  in the last 72 hours No results found for this basename: CHOL, HDL, LDLCALC, TRIG, CHOLHDL, LDLDIRECT,  in the last 72 hours No results found for this basename: TSH, T4TOTAL, FREET3, T3FREE, THYROIDAB,  in the last 72 hours No results found for this basename: VITAMINB12, FOLATE, FERRITIN, TIBC, IRON, RETICCTPCT,  in the last 72 hours Coags: No results found for this basename: PT, INR,  in the last 72 hours Microbiology: Recent Results (from the past 240 hour(s))  CULTURE, BLOOD (ROUTINE X 2)     Status: None   Collection Time    03/22/14 10:00 AM      Result Value Ref Range Status   Specimen Description BLOOD LEFT ANTECUBITAL   Final   Special Requests BOTTLES DRAWN AEROBIC ONLY 4CC   Final   Culture  Setup Time     Final   Value: 03/22/2014 16:16     Performed at [facility]   Culture     Final   Value:        BLOOD CULTURE RECEIVED NO GROWTH TO DATE CULTURE WILL BE HELD FOR 5 DAYS BEFORE ISSUING A FINAL NEGATIVE REPORT     Performed at [facility]   Report Status PENDING   Incomplete  URINE CULTURE     Status: None   Collection Time    03/22/14  3:40 PM      Result Value Ref Range Status   Specimen Description URINE, CATHETERIZED   Final   Special Requests NONE   Final   Culture  Setup Time  Final   Value:  03/22/2014 19:23     Performed at [facility] Count     Final   Value: NO GROWTH     Performed at [facility]   Culture     Final   Value: NO GROWTH     Performed at [facility]   Report Status 03/23/2014 FINAL   Final  CULTURE, BLOOD (ROUTINE X 2)     Status: None   Collection Time    03/22/14  6:40 PM      Result Value Ref Range Status   Specimen Description BLOOD CENTRAL LINE   Final   Special Requests BOTTLES DRAWN AEROBIC AND ANAEROBIC 5CC   Final   Culture  Setup Time     Final   Value: 03/23/2014 00:43     Performed at [facility]   Culture     Final   Value:        BLOOD CULTURE RECEIVED NO GROWTH TO DATE CULTURE WILL BE HELD FOR 5 DAYS BEFORE ISSUING A FINAL NEGATIVE REPORT     Performed at [facility]   Report Status PENDING   Incomplete  WOUND CULTURE     Status: None   Collection Time    03/23/14  2:45 PM      Result Value Ref Range Status   Specimen Description WOUND ASPIRATE ABDOMEN   Final   Special Requests LEFT RETROPERITONEAL HEMATOMA   Final   Gram Stain     Final   Value: NO WBC SEEN     NO SQUAMOUS EPITHELIAL CELLS SEEN     NO ORGANISMS SEEN     Performed at [facility]   Culture     Final   Value: NO GROWTH 2 DAYS     Performed at [facility]   Report Status 03/26/2014 FINAL   Final     BRIEF HOSPITAL COURSE:   Principal Problem: Low Back Pain-secondary to L4 fracture with surrounding hematoma -MRI showed an L-spine fluid therapy at L4 level also there was a suspicion of a aortic - Vertebral fistula. A CT angiogram of the abdomen and pelvis was also done which was inconclusive. Dr Thedore Mins discussed her case with Dr. Hart Rochester vascular surgeon in detail, he has ruled out retroperitoneal bleed or any aortic - Vertebral communication. He also saw the patient in consult. Dr Thedore Mins also discussed her case with Dr. Franky Macho and Dr. Danielle Dess neurosurgeon, who suggested patient is not an operative  candidate, they have reviewed the scans, Dr. Danielle Dess recommends general medical treatment and comfort care. He is of the opinion that patient likely L 4-spine fracture with blood in the spine and around the spinal process.  To rule out remote possibility of infection she underwent aspiration of L-spine fluid by IR on 03/23/2014. She was empirically started on IV antibiotics (Vanco/Invanz) and eventually transitioned to oral Doxycycline. Since her cultures are negative, she will be discharged without any further antibiotic therapy.  After detailed discussion with patient, her daughter [name],given her poor overall prognoses, family agreeable to start hospice care, family wanting to take patient home with hospice. -She will be continued on long acting morphine, and prn Morphine for break through pain.  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  -Currently compensated from CHF standpoint no acute, continue beta blocker fluid and salt restriction.   Anemia of chronic disease  -Likely for dilution from IV fluids,  reviewed her CT MRI in detail with vascular surgery, no signs of the retroperitoneal bleed, stable anemia panel, she received 2 units of packed RBCs by 03/23/2012. Continue PPI. Supportive care only, continue to monitor CBC periodically-will defer to PCP   ARF  - likely due to prerenal azotemia, also some injury from CT IV contrast which was needed to rule out aortic dissection. Resolved after hydration.   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 Seen by surgery. No surgical needs at this time.   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  -Unlikely chronic per surgery, no need for surgical intervention.   TODAY-DAY OF DISCHARGE:  Subjective:   [name] today has no headache,no chest abdominal pain,no new weakness tingling or numbness, feels much better wants to go home today.  Objective:   Blood pressure  138/65, pulse 101, temperature 99.6 F (37.6 C), temperature source Oral, resp. rate 17, height 5' 1.81" (1.57 m), weight 73.664 kg (162 lb 6.4 oz), SpO2 99.00%.  Intake/Output Summary (Last 24 hours) at 03/27/14 0954 Last data filed at 03/27/14 0915  Gross per 24 hour  Intake 1298.5 ml  Output    900 ml  Net  398.5 ml   Filed Weights   03/20/14 0033  Weight: 73.664 kg (162 lb 6.4 oz)    Exam Awake Alert, No new F.N deficits, Normal affect [place].AT,PERRAL Supple Neck,No JVD, No cervical lymphadenopathy appriciated.  Symmetrical Chest wall movement, Good air movement bilaterally, CTAB RRR,No Gallops,Rubs or new Murmurs, No Parasternal Heave +ve B.Sounds, Abd Soft, Non tender, No organomegaly appriciated, No rebound -guarding or rigidity. No Cyanosis, Clubbing or edema, No new Rash or bruise  DISCHARGE CONDITION: Stable  DISPOSITION: Home with home Hospice  DISCHARGE INSTRUCTIONS:    Activity:  As tolerated with Full fall precautions use walker/cane & assistance as needed  Diet recommendation: Heart Healthy diet      Discharge Instructions   Call MD for:  persistant nausea and vomiting    Complete by:  As directed      Call MD for:  severe uncontrolled pain    Complete by:  As directed      Diet - low sodium heart healthy    Complete by:  As directed      Increase activity slowly    Complete by:  As directed            Follow-up Information   Follow up with [name], MD. Schedule an appointment as soon as possible for a visit in 1 week.   Specialty:  Family Medicine   Contact information:   [address] [place] [address] [phone]       Total Time spent on discharge equals 45 minutes.  SignedJeoffrey Massed 03/27/2014 9:54 AM  **Disclaimer: This note may have been dictated with voice recognition software. Similar sounding words can inadvertently be transcribed and this note may contain transcription errors which  may not have been corrected upon publication of note.**

## 2014-03-27 NOTE — Discharge Instructions (Signed)
Follow with Primary MD  Astrid DivineGRIFFIN,ELAINE COLLINS, MD  and other consultant as instructed your Hospitalist MD  Please get a complete blood count and chemistry panel checked by your Primary MD at your next visit, and again as instructed by your Primary MD.  Get Medicines reviewed and adjusted. Please take all your medications with you for your next visit with your Primary MD  Please request your Primary MD to go over all hospital tests and procedure/radiological results at the follow up, please ask your Primary MD to get all Hospital records sent to his/her office.  If you experience worsening of your admission symptoms, develop shortness of breath, life threatening emergency, suicidal or homicidal thoughts you must seek medical attention immediately by calling 911 or calling your MD immediately  if symptoms less severe.  You must read complete instructions/literature along with all the possible adverse reactions/side effects for all the Medicines you take and that have been prescribed to you. Take any new Medicines after you have completely understood and accpet all the possible adverse reactions/side effects.   Do not drive when taking Pain medications.   Do not take more than prescribed Pain, Sleep and Anxiety Medications  Special Instructions: If you have smoked or chewed Tobacco  in the last 2 yrs please stop smoking, stop any regular Alcohol  and or any Recreational drug use.  Wear Seat belts while driving.  Please note  You were cared for by a hospitalist during your hospital stay. Once you are discharged, your primary care physician will handle any further medical issues. Please note that NO REFILLS for any discharge medications will be authorized once you are discharged, as it is imperative that you return to your primary care physician (or establish a relationship with a primary care physician if you do not have one) for your aftercare needs so that they can reassess your need for  medications and monitor your lab values.

## 2014-03-27 NOTE — Progress Notes (Signed)
Progress Note [facility] Pall[facility] Medicine Team at [facility]  Subjective: I spoke with [name] and daughter [name] this morning. They have lined up CNA care around the clock and wish to go home with hospice. Their main concern is that she is not in pain and is comfortable at all costs. They understand risk of paralysis with turning and care and are willing to accept this risk at home. [name] says she is nervous to go home but says she is less nervous than she would be going somewhere like SNF. [name] has many questions about equipment and getting prepared for home - I have contacted [facility] Clydie BraunKaren, RN to follow up with them today. They also need to have equipment delivered before discharge. I have also changed [name] from IV morphine for breakthrough pain to roxanol and will titrate that to an adequate dosage today as needed to prepare for home pain management. Nursing to give IV morphine prior to transport.   Objective: Allergies  Allergen Reactions  . Penicillins Hives, Shortness Of Breath and Nausea Only    Throat closed up and had hives and nausea. Tolerates carbapenems.   Scheduled Meds: . feeding supplement (ENSURE)  1 Container Oral BID BM  . lactose free nutrition  237 mL Oral Q24H  . metoprolol  50 mg Oral BID  . morphine  30 mg Oral Q12H  . pantoprazole (PROTONIX) IV  40 mg Intravenous Q12H  . sodium chloride  10-40 mL Intracatheter Q12H   Continuous Infusions: . dextrose 5 % and 0.45% NaCl 20 mL/hr at 03/26/14 1657   PRN Meds:.ALPRAZolam, diclofenac sodium, diphenhydrAMINE, hydrALAZINE, morphine CONCENTRATE, naLOXone (NARCAN)  injection, ondansetron (ZOFRAN) IV, sodium chloride, sodium chloride  BP 138/65  Pulse 101  Temp(Src) 99.6 F (37.6 C) (Oral)  Resp 17  Ht 5' 1.81" (1.57 m)  Wt 73.664 kg (162 lb 6.4 oz)  BMI 29.89 kg/m2  SpO2 99%   PPS: 30%     Intake/Output Summary (Last 24 hours) at 03/27/14 1114 Last data filed at 03/27/14 0915  Gross per 24 hour   Intake 1178.5 ml  Output    900 ml  Net  278.5 ml         Physical Exam:  General: NAD, sleepy, pleasant, chronically ill appearing  HEENT: [place]/AT, no JVD, moist mucous membranes without exudate  Chest: CTA throughout, diminished bibasilar, no labored breathing, symmetric  CVS: RRR  Abdomen: Soft, NT, ND, +BS  Ext: MAE, no edema, warm to touch  Neuro: Alert, awake, oriented x 3, follows commands   Labs: CBC    Component Value Date/Time   WBC 8.2 03/25/2014 0451   RBC 2.62* 03/25/2014 0451   RBC 2.80* 03/23/2014 1650   HGB 8.2* 03/25/2014 0451   HCT 24.7* 03/25/2014 0451   PLT 234 03/25/2014 0451   MCV 94.3 03/25/2014 0451   MCH 31.3 03/25/2014 0451   MCHC 33.2 03/25/2014 0451   RDW 14.0 03/25/2014 0451   LYMPHSABS 0.9 03/20/2014 0611   MONOABS 0.7 03/20/2014 0611   EOSABS 0.0 03/20/2014 0611   BASOSABS 0.0 03/20/2014 0611    BMET    Component Value Date/Time   NA 140 03/25/2014 0451   K 4.4 03/25/2014 0451   CL 104 03/25/2014 0451   CO2 27 03/25/2014 0451   GLUCOSE 99 03/25/2014 0451   BUN 30* 03/25/2014 0451   CREATININE 0.78 03/25/2014 0451   CALCIUM 8.8 03/25/2014 0451   GFRNONAA 77* 03/25/2014 0451   GFRAA 89*  03/25/2014 0451    CMP     Component Value Date/Time   NA 140 03/25/2014 0451   K 4.4 03/25/2014 0451   CL 104 03/25/2014 0451   CO2 27 03/25/2014 0451   GLUCOSE 99 03/25/2014 0451   BUN 30* 03/25/2014 0451   CREATININE 0.78 03/25/2014 0451   CALCIUM 8.8 03/25/2014 0451   PROT 7.0 03/20/2014 0611   ALBUMIN 3.1* 03/20/2014 0611   AST 29 03/20/2014 0611   ALT 19 03/20/2014 0611   ALKPHOS 119* 03/20/2014 0611   BILITOT 0.4 03/20/2014 0611   GFRNONAA 77* 03/25/2014 0451   GFRAA 89* 03/25/2014 0451     Assessment and Plan:  1. Code Status: DNR 2. Symptom Control:  1. Weakness: Continue medical management.  2. Anxiety: Alprazolam 0.25 mg every 4 hours prn.  3. Itching: Benadryl prn. 4. Pain: MS Contin 30 mg every 12 hours. Roxanol 5 mg every hour prn. Morphine 1 mg IV to be given  prior to transport home.  3. Psycho/Social: Emotional support provided to patient and family.  4. Disposition: Home with hospice.      Time In Time Out Total Time Spent with Patient Total Overall Time  1030 1110     Greater than 50%  of this time was spent counseling and coordinating care related to the above assessment and plan.  Yong Channel, NP Palliative Medicine Team Pager # [phone] (M-F 8a-5p) Team Phone # [phone] (Nights/Weekends)

## 2014-03-28 ENCOUNTER — Encounter (HOSPITAL_BASED_OUTPATIENT_CLINIC_OR_DEPARTMENT_OTHER): Payer: Medicare Other | Attending: General Surgery

## 2014-03-28 LAB — CULTURE, BLOOD (ROUTINE X 2): CULTURE: NO GROWTH

## 2014-03-29 LAB — CULTURE, BLOOD (ROUTINE X 2): Culture: NO GROWTH

## 2014-03-31 ENCOUNTER — Ambulatory Visit (HOSPITAL_COMMUNITY): Payer: Medicare Other

## 2014-04-02 NOTE — Consult Note (Signed)
I have reviewed and discussed the care of this patient in detail with the nurse practitioner including pertinent patient records, physical exam findings and data. I agree with details of this encounter.  

## 2014-04-24 ENCOUNTER — Other Ambulatory Visit: Payer: Self-pay | Admitting: Internal Medicine

## 2014-08-24 ENCOUNTER — Encounter (HOSPITAL_COMMUNITY): Payer: Self-pay | Admitting: Vascular Surgery

## 2014-10-16 DEATH — deceased

## 2014-11-17 IMAGING — CT CT HIP*L* W/O CM
2 of 4 series · 17 of 46 positions shown, 19 images · non-contrast
Comparison: Plain films 12/25/2012.

CLINICAL DATA: Fall.  Left hip pain and weakness.

CT OF THE LEFT HIP WITHOUT CONTRAST
TECHNIQUE: Multidetector CT imaging was performed according to the
standard protocol. Multiplanar CT image reconstructions were also
generated.

[Series 5: hip 2.0 b40f · axial · 0.37mm/px · z∈[-389,-233]mm · 14 of 84 slices shown, 16 images]
[im 3/84  soft-tissue]
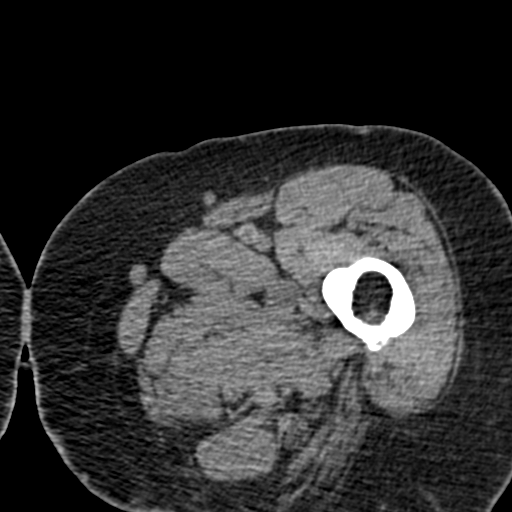
[im 3/84  bone]
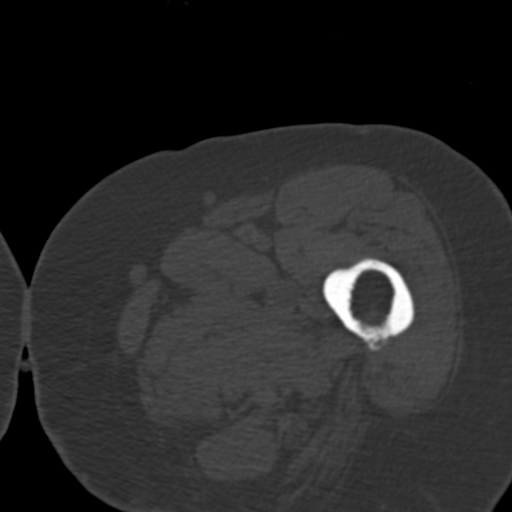
[im 9/84  soft-tissue]
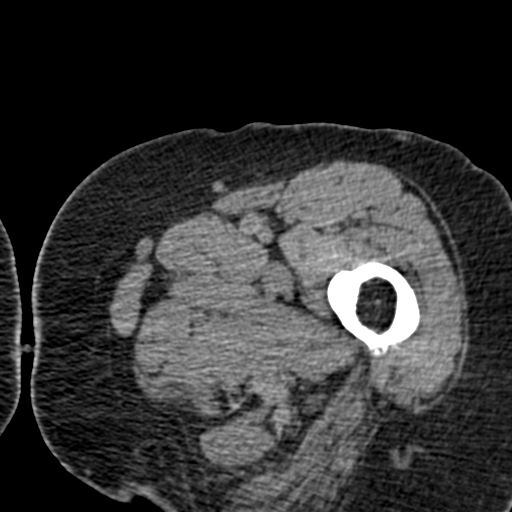
[im 15/84  soft-tissue]
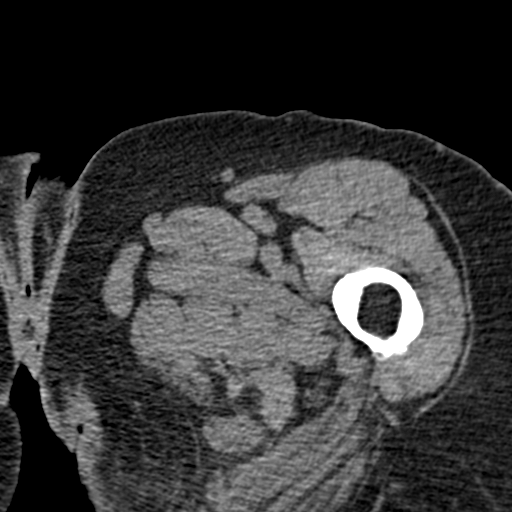
[im 21/84  soft-tissue]
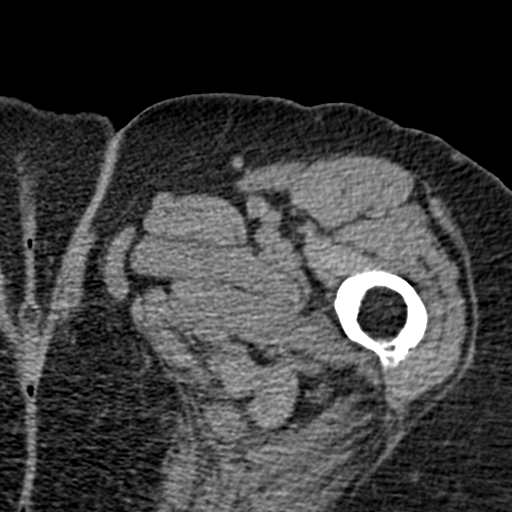
[im 27/84  soft-tissue]
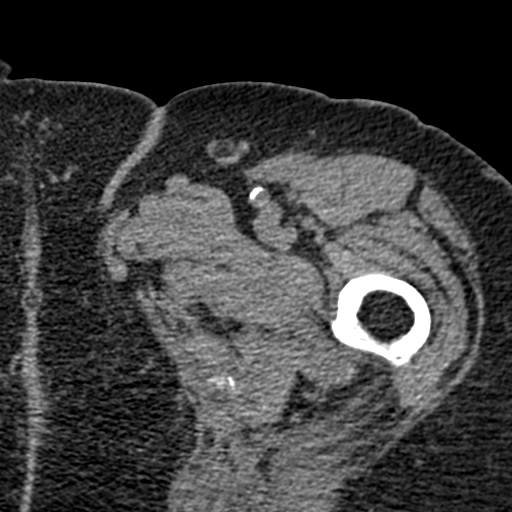
[im 33/84  soft-tissue]
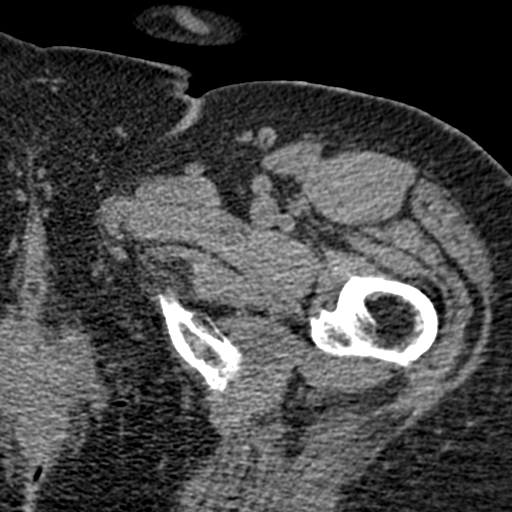
[im 39/84  soft-tissue]
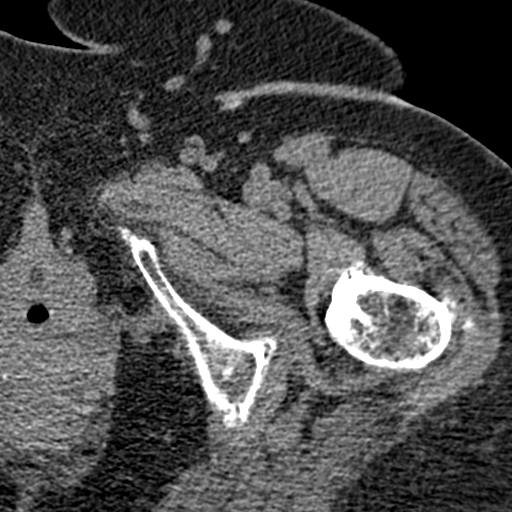
[im 45/84  soft-tissue]
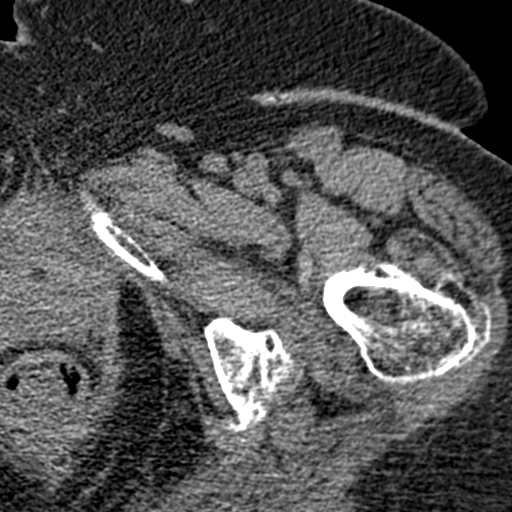
[im 51/84  soft-tissue]
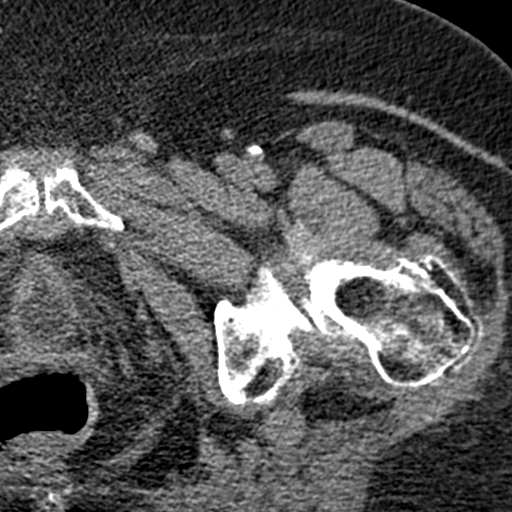
[im 51/84  bone]
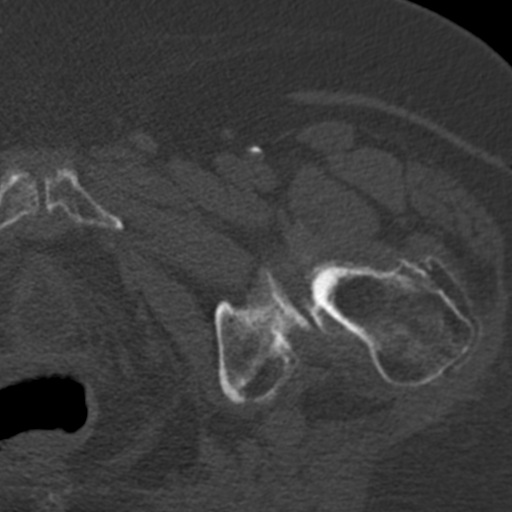
[im 57/84  soft-tissue]
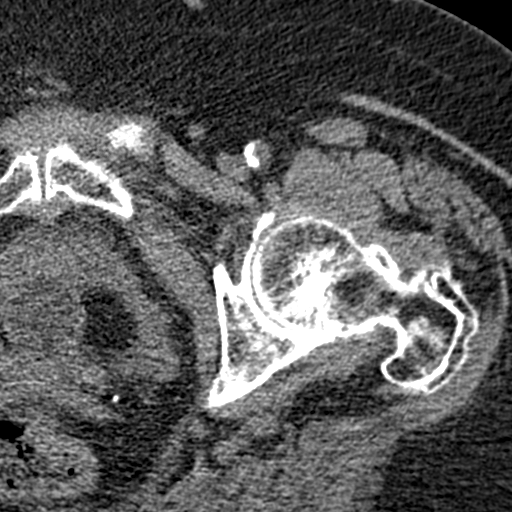
[im 63/84  soft-tissue]
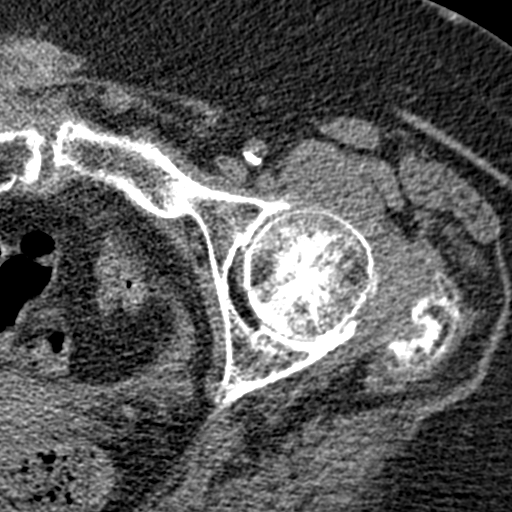
[im 69/84  soft-tissue]
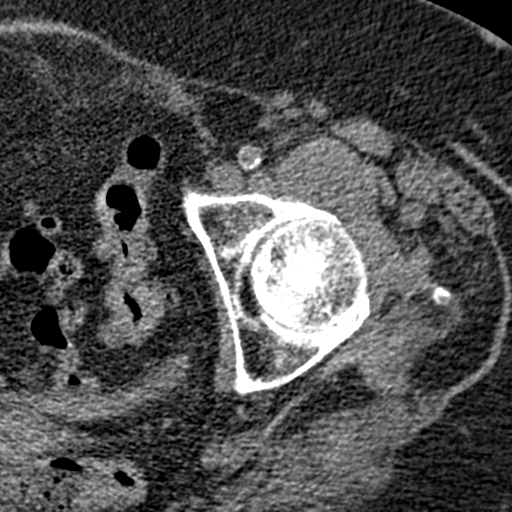
[im 75/84  soft-tissue]
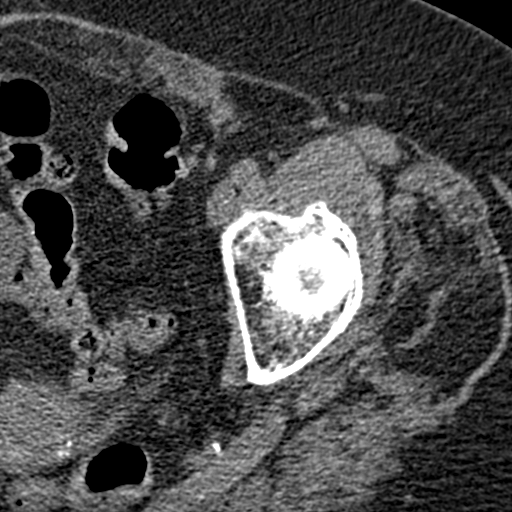
[im 81/84  soft-tissue]
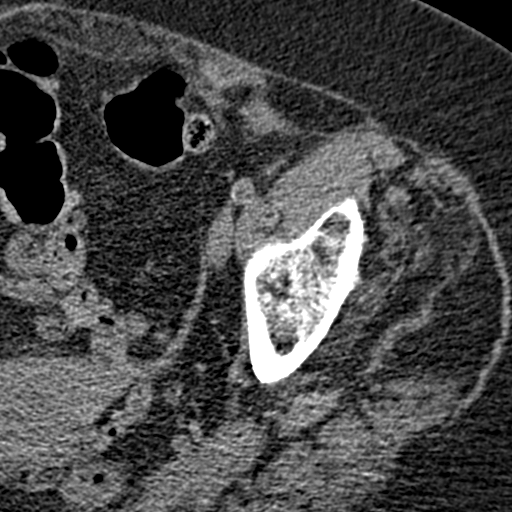

[Series 607: sta coronal · coronal · 0.37mm/px · 3 of 60 slices shown]
[im 20/60  soft-tissue]
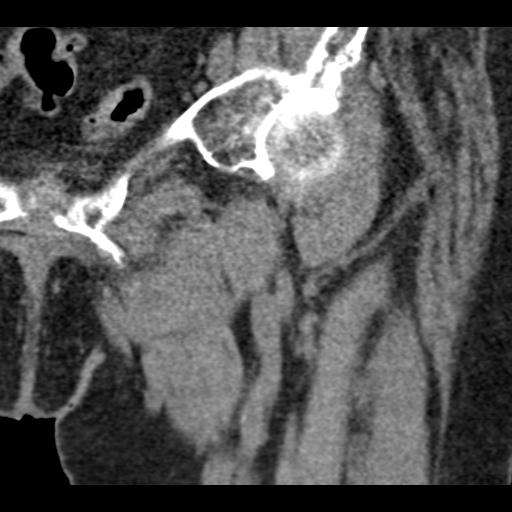
[im 27/60  soft-tissue]
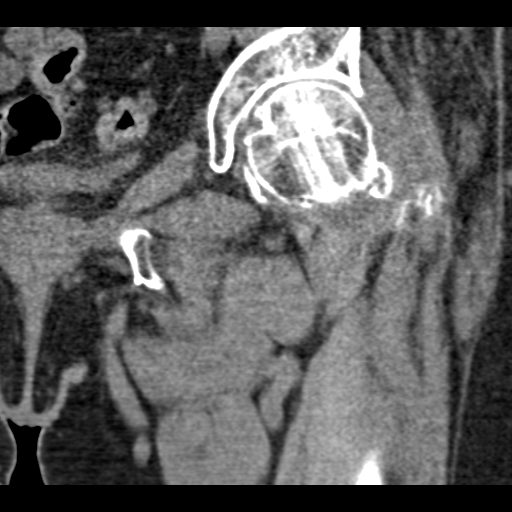
[im 33/60  soft-tissue]
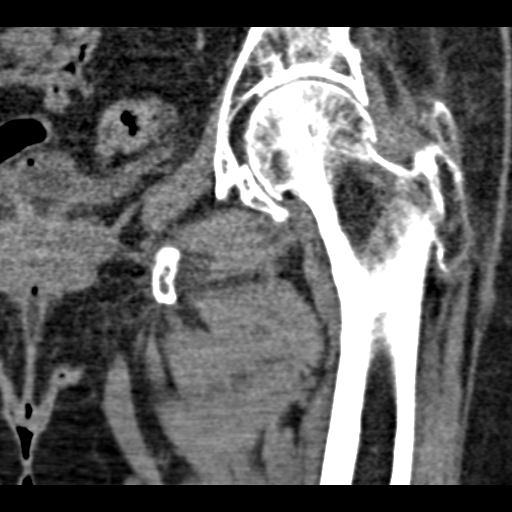

[17 of 46 positions shown; findings below may reference images not displayed]

FINDINGS: No fracture is identified.  The patient has right hip
osteoarthritis with collar osteophytes present about the humeral
head and joint space narrowing.  Enthesopathic change about the
greater trochanter is identified.  No hip joint effusion is seen.
Small exostosis off the superior pubic ramus is incidentally noted.
Imaged intrapelvic contents show no focal abnormality.
IMPRESSION: 1.  Negative for fracture.
2.  Advanced osteoarthritis left hip.
